# Patient Record
Sex: Male | Born: 1935
Health system: Southern US, Community
[De-identification: ages and names within clinical notes are randomized; demographics above are authoritative.]

## PROBLEM LIST (undated history)

## (undated) DIAGNOSIS — N183 Chronic kidney disease, stage 3 unspecified: Secondary | ICD-10-CM

## (undated) DIAGNOSIS — E785 Hyperlipidemia, unspecified: Secondary | ICD-10-CM

## (undated) DIAGNOSIS — M4800 Spinal stenosis, site unspecified: Secondary | ICD-10-CM

## (undated) DIAGNOSIS — E039 Hypothyroidism, unspecified: Secondary | ICD-10-CM

## (undated) DIAGNOSIS — I10 Essential (primary) hypertension: Secondary | ICD-10-CM

## (undated) DIAGNOSIS — I2510 Atherosclerotic heart disease of native coronary artery without angina pectoris: Secondary | ICD-10-CM

## (undated) DIAGNOSIS — E041 Nontoxic single thyroid nodule: Secondary | ICD-10-CM

## (undated) DIAGNOSIS — E669 Obesity, unspecified: Secondary | ICD-10-CM

## (undated) DIAGNOSIS — I639 Cerebral infarction, unspecified: Secondary | ICD-10-CM

## (undated) DIAGNOSIS — D649 Anemia, unspecified: Secondary | ICD-10-CM

## (undated) DIAGNOSIS — I499 Cardiac arrhythmia, unspecified: Secondary | ICD-10-CM

## (undated) HISTORY — PX: PILONIDAL CYST EXCISION: SHX744

## (undated) HISTORY — PX: TONSILLECTOMY: SUR1361

---

## 2011-11-07 DIAGNOSIS — M159 Polyosteoarthritis, unspecified: Secondary | ICD-10-CM | POA: Diagnosis not present

## 2011-12-05 DIAGNOSIS — L988 Other specified disorders of the skin and subcutaneous tissue: Secondary | ICD-10-CM | POA: Diagnosis not present

## 2012-02-06 DIAGNOSIS — L988 Other specified disorders of the skin and subcutaneous tissue: Secondary | ICD-10-CM | POA: Diagnosis not present

## 2012-02-06 DIAGNOSIS — B353 Tinea pedis: Secondary | ICD-10-CM | POA: Diagnosis not present

## 2012-02-10 DIAGNOSIS — M6530 Trigger finger, unspecified finger: Secondary | ICD-10-CM | POA: Diagnosis not present

## 2012-02-10 DIAGNOSIS — M5137 Other intervertebral disc degeneration, lumbosacral region: Secondary | ICD-10-CM | POA: Diagnosis not present

## 2012-02-10 DIAGNOSIS — M159 Polyosteoarthritis, unspecified: Secondary | ICD-10-CM | POA: Diagnosis not present

## 2012-02-17 DIAGNOSIS — R293 Abnormal posture: Secondary | ICD-10-CM | POA: Diagnosis not present

## 2012-02-17 DIAGNOSIS — M79609 Pain in unspecified limb: Secondary | ICD-10-CM | POA: Diagnosis not present

## 2012-02-17 DIAGNOSIS — M25549 Pain in joints of unspecified hand: Secondary | ICD-10-CM | POA: Diagnosis not present

## 2012-02-17 DIAGNOSIS — R269 Unspecified abnormalities of gait and mobility: Secondary | ICD-10-CM | POA: Diagnosis not present

## 2012-02-17 DIAGNOSIS — M19049 Primary osteoarthritis, unspecified hand: Secondary | ICD-10-CM | POA: Diagnosis not present

## 2012-02-19 DIAGNOSIS — M79609 Pain in unspecified limb: Secondary | ICD-10-CM | POA: Diagnosis not present

## 2012-02-19 DIAGNOSIS — M19049 Primary osteoarthritis, unspecified hand: Secondary | ICD-10-CM | POA: Diagnosis not present

## 2012-02-19 DIAGNOSIS — R269 Unspecified abnormalities of gait and mobility: Secondary | ICD-10-CM | POA: Diagnosis not present

## 2012-02-19 DIAGNOSIS — M25549 Pain in joints of unspecified hand: Secondary | ICD-10-CM | POA: Diagnosis not present

## 2012-02-19 DIAGNOSIS — R293 Abnormal posture: Secondary | ICD-10-CM | POA: Diagnosis not present

## 2012-02-24 DIAGNOSIS — M79609 Pain in unspecified limb: Secondary | ICD-10-CM | POA: Diagnosis not present

## 2012-02-24 DIAGNOSIS — R293 Abnormal posture: Secondary | ICD-10-CM | POA: Diagnosis not present

## 2012-02-24 DIAGNOSIS — M19049 Primary osteoarthritis, unspecified hand: Secondary | ICD-10-CM | POA: Diagnosis not present

## 2012-02-24 DIAGNOSIS — M25549 Pain in joints of unspecified hand: Secondary | ICD-10-CM | POA: Diagnosis not present

## 2012-02-24 DIAGNOSIS — R269 Unspecified abnormalities of gait and mobility: Secondary | ICD-10-CM | POA: Diagnosis not present

## 2012-02-26 DIAGNOSIS — M79609 Pain in unspecified limb: Secondary | ICD-10-CM | POA: Diagnosis not present

## 2012-02-26 DIAGNOSIS — M25549 Pain in joints of unspecified hand: Secondary | ICD-10-CM | POA: Diagnosis not present

## 2012-02-26 DIAGNOSIS — M19049 Primary osteoarthritis, unspecified hand: Secondary | ICD-10-CM | POA: Diagnosis not present

## 2012-02-26 DIAGNOSIS — R293 Abnormal posture: Secondary | ICD-10-CM | POA: Diagnosis not present

## 2012-02-26 DIAGNOSIS — R269 Unspecified abnormalities of gait and mobility: Secondary | ICD-10-CM | POA: Diagnosis not present

## 2012-02-27 DIAGNOSIS — M19049 Primary osteoarthritis, unspecified hand: Secondary | ICD-10-CM | POA: Diagnosis not present

## 2012-02-27 DIAGNOSIS — M25549 Pain in joints of unspecified hand: Secondary | ICD-10-CM | POA: Diagnosis not present

## 2012-02-27 DIAGNOSIS — R293 Abnormal posture: Secondary | ICD-10-CM | POA: Diagnosis not present

## 2012-02-27 DIAGNOSIS — R269 Unspecified abnormalities of gait and mobility: Secondary | ICD-10-CM | POA: Diagnosis not present

## 2012-02-27 DIAGNOSIS — M79609 Pain in unspecified limb: Secondary | ICD-10-CM | POA: Diagnosis not present

## 2012-03-01 DIAGNOSIS — M19049 Primary osteoarthritis, unspecified hand: Secondary | ICD-10-CM | POA: Diagnosis not present

## 2012-03-01 DIAGNOSIS — R269 Unspecified abnormalities of gait and mobility: Secondary | ICD-10-CM | POA: Diagnosis not present

## 2012-03-01 DIAGNOSIS — M25549 Pain in joints of unspecified hand: Secondary | ICD-10-CM | POA: Diagnosis not present

## 2012-03-01 DIAGNOSIS — R293 Abnormal posture: Secondary | ICD-10-CM | POA: Diagnosis not present

## 2012-03-01 DIAGNOSIS — M79609 Pain in unspecified limb: Secondary | ICD-10-CM | POA: Diagnosis not present

## 2012-03-03 DIAGNOSIS — M19049 Primary osteoarthritis, unspecified hand: Secondary | ICD-10-CM | POA: Diagnosis not present

## 2012-03-03 DIAGNOSIS — R293 Abnormal posture: Secondary | ICD-10-CM | POA: Diagnosis not present

## 2012-03-03 DIAGNOSIS — M25549 Pain in joints of unspecified hand: Secondary | ICD-10-CM | POA: Diagnosis not present

## 2012-03-03 DIAGNOSIS — R269 Unspecified abnormalities of gait and mobility: Secondary | ICD-10-CM | POA: Diagnosis not present

## 2012-03-03 DIAGNOSIS — M79609 Pain in unspecified limb: Secondary | ICD-10-CM | POA: Diagnosis not present

## 2012-03-10 DIAGNOSIS — R269 Unspecified abnormalities of gait and mobility: Secondary | ICD-10-CM | POA: Diagnosis not present

## 2012-03-10 DIAGNOSIS — M25549 Pain in joints of unspecified hand: Secondary | ICD-10-CM | POA: Diagnosis not present

## 2012-03-10 DIAGNOSIS — R293 Abnormal posture: Secondary | ICD-10-CM | POA: Diagnosis not present

## 2012-03-10 DIAGNOSIS — M79609 Pain in unspecified limb: Secondary | ICD-10-CM | POA: Diagnosis not present

## 2012-03-10 DIAGNOSIS — M19049 Primary osteoarthritis, unspecified hand: Secondary | ICD-10-CM | POA: Diagnosis not present

## 2012-03-11 DIAGNOSIS — M159 Polyosteoarthritis, unspecified: Secondary | ICD-10-CM | POA: Diagnosis not present

## 2012-03-12 DIAGNOSIS — M19049 Primary osteoarthritis, unspecified hand: Secondary | ICD-10-CM | POA: Diagnosis not present

## 2012-03-12 DIAGNOSIS — R269 Unspecified abnormalities of gait and mobility: Secondary | ICD-10-CM | POA: Diagnosis not present

## 2012-03-12 DIAGNOSIS — M25549 Pain in joints of unspecified hand: Secondary | ICD-10-CM | POA: Diagnosis not present

## 2012-03-12 DIAGNOSIS — R293 Abnormal posture: Secondary | ICD-10-CM | POA: Diagnosis not present

## 2012-03-12 DIAGNOSIS — M79609 Pain in unspecified limb: Secondary | ICD-10-CM | POA: Diagnosis not present

## 2012-03-16 DIAGNOSIS — M79609 Pain in unspecified limb: Secondary | ICD-10-CM | POA: Diagnosis not present

## 2012-03-16 DIAGNOSIS — M19049 Primary osteoarthritis, unspecified hand: Secondary | ICD-10-CM | POA: Diagnosis not present

## 2012-03-16 DIAGNOSIS — M25549 Pain in joints of unspecified hand: Secondary | ICD-10-CM | POA: Diagnosis not present

## 2012-03-16 DIAGNOSIS — R269 Unspecified abnormalities of gait and mobility: Secondary | ICD-10-CM | POA: Diagnosis not present

## 2012-03-16 DIAGNOSIS — R293 Abnormal posture: Secondary | ICD-10-CM | POA: Diagnosis not present

## 2012-03-18 DIAGNOSIS — M19049 Primary osteoarthritis, unspecified hand: Secondary | ICD-10-CM | POA: Diagnosis not present

## 2012-03-18 DIAGNOSIS — R293 Abnormal posture: Secondary | ICD-10-CM | POA: Diagnosis not present

## 2012-03-18 DIAGNOSIS — M25549 Pain in joints of unspecified hand: Secondary | ICD-10-CM | POA: Diagnosis not present

## 2012-03-18 DIAGNOSIS — R269 Unspecified abnormalities of gait and mobility: Secondary | ICD-10-CM | POA: Diagnosis not present

## 2012-03-18 DIAGNOSIS — M79609 Pain in unspecified limb: Secondary | ICD-10-CM | POA: Diagnosis not present

## 2012-04-09 DIAGNOSIS — L988 Other specified disorders of the skin and subcutaneous tissue: Secondary | ICD-10-CM | POA: Diagnosis not present

## 2012-06-11 DIAGNOSIS — L988 Other specified disorders of the skin and subcutaneous tissue: Secondary | ICD-10-CM | POA: Diagnosis not present

## 2012-06-11 DIAGNOSIS — B351 Tinea unguium: Secondary | ICD-10-CM | POA: Diagnosis not present

## 2012-06-16 DIAGNOSIS — M159 Polyosteoarthritis, unspecified: Secondary | ICD-10-CM | POA: Diagnosis not present

## 2012-06-16 DIAGNOSIS — Z79899 Other long term (current) drug therapy: Secondary | ICD-10-CM | POA: Diagnosis not present

## 2012-08-11 DIAGNOSIS — L988 Other specified disorders of the skin and subcutaneous tissue: Secondary | ICD-10-CM | POA: Diagnosis not present

## 2012-09-06 DIAGNOSIS — M159 Polyosteoarthritis, unspecified: Secondary | ICD-10-CM | POA: Diagnosis not present

## 2012-09-06 DIAGNOSIS — M5137 Other intervertebral disc degeneration, lumbosacral region: Secondary | ICD-10-CM | POA: Diagnosis not present

## 2012-10-15 DIAGNOSIS — L988 Other specified disorders of the skin and subcutaneous tissue: Secondary | ICD-10-CM | POA: Diagnosis not present

## 2012-11-22 DIAGNOSIS — D649 Anemia, unspecified: Secondary | ICD-10-CM | POA: Diagnosis not present

## 2012-11-22 DIAGNOSIS — M159 Polyosteoarthritis, unspecified: Secondary | ICD-10-CM | POA: Diagnosis not present

## 2012-12-09 DIAGNOSIS — M25569 Pain in unspecified knee: Secondary | ICD-10-CM | POA: Diagnosis not present

## 2012-12-09 DIAGNOSIS — M159 Polyosteoarthritis, unspecified: Secondary | ICD-10-CM | POA: Diagnosis not present

## 2012-12-21 DIAGNOSIS — L988 Other specified disorders of the skin and subcutaneous tissue: Secondary | ICD-10-CM | POA: Diagnosis not present

## 2013-03-02 DIAGNOSIS — L988 Other specified disorders of the skin and subcutaneous tissue: Secondary | ICD-10-CM | POA: Diagnosis not present

## 2013-03-02 DIAGNOSIS — M159 Polyosteoarthritis, unspecified: Secondary | ICD-10-CM | POA: Diagnosis not present

## 2013-05-09 DIAGNOSIS — L988 Other specified disorders of the skin and subcutaneous tissue: Secondary | ICD-10-CM | POA: Diagnosis not present

## 2013-09-01 DIAGNOSIS — Z23 Encounter for immunization: Secondary | ICD-10-CM | POA: Diagnosis not present

## 2014-04-26 DIAGNOSIS — Z79899 Other long term (current) drug therapy: Secondary | ICD-10-CM | POA: Diagnosis not present

## 2014-04-26 DIAGNOSIS — Z0000 Encounter for general adult medical examination without abnormal findings: Secondary | ICD-10-CM | POA: Diagnosis not present

## 2014-04-26 DIAGNOSIS — E785 Hyperlipidemia, unspecified: Secondary | ICD-10-CM | POA: Diagnosis not present

## 2014-04-26 DIAGNOSIS — Z125 Encounter for screening for malignant neoplasm of prostate: Secondary | ICD-10-CM | POA: Diagnosis not present

## 2014-04-26 DIAGNOSIS — Z7189 Other specified counseling: Secondary | ICD-10-CM | POA: Diagnosis not present

## 2014-05-03 ENCOUNTER — Other Ambulatory Visit: Payer: Self-pay | Admitting: Family

## 2014-05-03 ENCOUNTER — Ambulatory Visit
Admission: RE | Admit: 2014-05-03 | Discharge: 2014-05-03 | Disposition: A | Discharge status: Home / Self Care | Payer: Medicare Other | Source: Ambulatory Visit | Attending: Family | Admitting: Family

## 2014-05-03 DIAGNOSIS — M25559 Pain in unspecified hip: Secondary | ICD-10-CM | POA: Diagnosis not present

## 2014-05-03 DIAGNOSIS — S79919A Unspecified injury of unspecified hip, initial encounter: Secondary | ICD-10-CM | POA: Diagnosis not present

## 2014-05-03 DIAGNOSIS — W19XXXA Unspecified fall, initial encounter: Secondary | ICD-10-CM | POA: Diagnosis not present

## 2014-05-03 DIAGNOSIS — S79929A Unspecified injury of unspecified thigh, initial encounter: Secondary | ICD-10-CM | POA: Diagnosis not present

## 2014-05-03 DIAGNOSIS — I10 Essential (primary) hypertension: Secondary | ICD-10-CM | POA: Diagnosis not present

## 2014-05-03 DIAGNOSIS — E785 Hyperlipidemia, unspecified: Secondary | ICD-10-CM | POA: Diagnosis not present

## 2014-05-03 DIAGNOSIS — E039 Hypothyroidism, unspecified: Secondary | ICD-10-CM | POA: Diagnosis not present

## 2014-05-03 DIAGNOSIS — K219 Gastro-esophageal reflux disease without esophagitis: Secondary | ICD-10-CM | POA: Diagnosis not present

## 2014-05-03 DIAGNOSIS — N189 Chronic kidney disease, unspecified: Secondary | ICD-10-CM | POA: Diagnosis not present

## 2014-05-18 DIAGNOSIS — R972 Elevated prostate specific antigen [PSA]: Secondary | ICD-10-CM | POA: Diagnosis not present

## 2014-06-01 DIAGNOSIS — M21619 Bunion of unspecified foot: Secondary | ICD-10-CM | POA: Diagnosis not present

## 2014-06-01 DIAGNOSIS — L84 Corns and callosities: Secondary | ICD-10-CM | POA: Diagnosis not present

## 2014-06-01 DIAGNOSIS — L608 Other nail disorders: Secondary | ICD-10-CM | POA: Diagnosis not present

## 2014-06-01 DIAGNOSIS — M2010 Hallux valgus (acquired), unspecified foot: Secondary | ICD-10-CM | POA: Diagnosis not present

## 2014-06-01 DIAGNOSIS — B351 Tinea unguium: Secondary | ICD-10-CM | POA: Diagnosis not present

## 2014-06-26 DIAGNOSIS — L608 Other nail disorders: Secondary | ICD-10-CM | POA: Diagnosis not present

## 2014-06-28 DIAGNOSIS — G4761 Periodic limb movement disorder: Secondary | ICD-10-CM | POA: Diagnosis not present

## 2014-07-27 DIAGNOSIS — Z23 Encounter for immunization: Secondary | ICD-10-CM | POA: Diagnosis not present

## 2014-08-28 DIAGNOSIS — Z87898 Personal history of other specified conditions: Secondary | ICD-10-CM | POA: Diagnosis not present

## 2014-08-28 DIAGNOSIS — M25551 Pain in right hip: Secondary | ICD-10-CM | POA: Diagnosis not present

## 2014-08-31 DIAGNOSIS — M1611 Unilateral primary osteoarthritis, right hip: Secondary | ICD-10-CM | POA: Diagnosis not present

## 2014-08-31 DIAGNOSIS — M25551 Pain in right hip: Secondary | ICD-10-CM | POA: Diagnosis not present

## 2014-09-13 DIAGNOSIS — B351 Tinea unguium: Secondary | ICD-10-CM | POA: Diagnosis not present

## 2014-09-20 DIAGNOSIS — M25551 Pain in right hip: Secondary | ICD-10-CM | POA: Diagnosis not present

## 2014-09-20 DIAGNOSIS — M25571 Pain in right ankle and joints of right foot: Secondary | ICD-10-CM | POA: Diagnosis not present

## 2014-09-20 DIAGNOSIS — D649 Anemia, unspecified: Secondary | ICD-10-CM | POA: Diagnosis not present

## 2014-09-21 DIAGNOSIS — M7661 Achilles tendinitis, right leg: Secondary | ICD-10-CM | POA: Diagnosis not present

## 2014-09-21 DIAGNOSIS — M4317 Spondylolisthesis, lumbosacral region: Secondary | ICD-10-CM | POA: Diagnosis not present

## 2014-09-21 DIAGNOSIS — M5441 Lumbago with sciatica, right side: Secondary | ICD-10-CM | POA: Diagnosis not present

## 2014-10-05 DIAGNOSIS — M25551 Pain in right hip: Secondary | ICD-10-CM | POA: Diagnosis not present

## 2014-10-05 DIAGNOSIS — M5441 Lumbago with sciatica, right side: Secondary | ICD-10-CM | POA: Diagnosis not present

## 2014-10-05 DIAGNOSIS — M4317 Spondylolisthesis, lumbosacral region: Secondary | ICD-10-CM | POA: Diagnosis not present

## 2014-10-16 DIAGNOSIS — D649 Anemia, unspecified: Secondary | ICD-10-CM | POA: Diagnosis not present

## 2014-10-16 DIAGNOSIS — J019 Acute sinusitis, unspecified: Secondary | ICD-10-CM | POA: Diagnosis not present

## 2014-10-24 DIAGNOSIS — M5441 Lumbago with sciatica, right side: Secondary | ICD-10-CM | POA: Diagnosis not present

## 2014-10-31 DIAGNOSIS — M6281 Muscle weakness (generalized): Secondary | ICD-10-CM | POA: Diagnosis not present

## 2014-10-31 DIAGNOSIS — M2560 Stiffness of unspecified joint, not elsewhere classified: Secondary | ICD-10-CM | POA: Diagnosis not present

## 2014-10-31 DIAGNOSIS — M5441 Lumbago with sciatica, right side: Secondary | ICD-10-CM | POA: Diagnosis not present

## 2014-11-02 DIAGNOSIS — M2560 Stiffness of unspecified joint, not elsewhere classified: Secondary | ICD-10-CM | POA: Diagnosis not present

## 2014-11-02 DIAGNOSIS — M5441 Lumbago with sciatica, right side: Secondary | ICD-10-CM | POA: Diagnosis not present

## 2014-11-02 DIAGNOSIS — M6281 Muscle weakness (generalized): Secondary | ICD-10-CM | POA: Diagnosis not present

## 2014-11-06 DIAGNOSIS — M2560 Stiffness of unspecified joint, not elsewhere classified: Secondary | ICD-10-CM | POA: Diagnosis not present

## 2014-11-06 DIAGNOSIS — M5441 Lumbago with sciatica, right side: Secondary | ICD-10-CM | POA: Diagnosis not present

## 2014-11-06 DIAGNOSIS — M6281 Muscle weakness (generalized): Secondary | ICD-10-CM | POA: Diagnosis not present

## 2014-11-08 DIAGNOSIS — M2560 Stiffness of unspecified joint, not elsewhere classified: Secondary | ICD-10-CM | POA: Diagnosis not present

## 2014-11-08 DIAGNOSIS — M6281 Muscle weakness (generalized): Secondary | ICD-10-CM | POA: Diagnosis not present

## 2014-11-08 DIAGNOSIS — M5441 Lumbago with sciatica, right side: Secondary | ICD-10-CM | POA: Diagnosis not present

## 2014-11-13 DIAGNOSIS — M5441 Lumbago with sciatica, right side: Secondary | ICD-10-CM | POA: Diagnosis not present

## 2014-11-13 DIAGNOSIS — M2560 Stiffness of unspecified joint, not elsewhere classified: Secondary | ICD-10-CM | POA: Diagnosis not present

## 2014-11-13 DIAGNOSIS — M6281 Muscle weakness (generalized): Secondary | ICD-10-CM | POA: Diagnosis not present

## 2014-11-15 DIAGNOSIS — M2560 Stiffness of unspecified joint, not elsewhere classified: Secondary | ICD-10-CM | POA: Diagnosis not present

## 2014-11-15 DIAGNOSIS — M5441 Lumbago with sciatica, right side: Secondary | ICD-10-CM | POA: Diagnosis not present

## 2014-11-15 DIAGNOSIS — M6281 Muscle weakness (generalized): Secondary | ICD-10-CM | POA: Diagnosis not present

## 2014-11-20 DIAGNOSIS — M2560 Stiffness of unspecified joint, not elsewhere classified: Secondary | ICD-10-CM | POA: Diagnosis not present

## 2014-11-20 DIAGNOSIS — M5441 Lumbago with sciatica, right side: Secondary | ICD-10-CM | POA: Diagnosis not present

## 2014-11-20 DIAGNOSIS — M6281 Muscle weakness (generalized): Secondary | ICD-10-CM | POA: Diagnosis not present

## 2014-11-22 DIAGNOSIS — M6281 Muscle weakness (generalized): Secondary | ICD-10-CM | POA: Diagnosis not present

## 2014-11-22 DIAGNOSIS — M2560 Stiffness of unspecified joint, not elsewhere classified: Secondary | ICD-10-CM | POA: Diagnosis not present

## 2014-11-22 DIAGNOSIS — M5441 Lumbago with sciatica, right side: Secondary | ICD-10-CM | POA: Diagnosis not present

## 2014-11-27 DIAGNOSIS — M6281 Muscle weakness (generalized): Secondary | ICD-10-CM | POA: Diagnosis not present

## 2014-11-27 DIAGNOSIS — M2560 Stiffness of unspecified joint, not elsewhere classified: Secondary | ICD-10-CM | POA: Diagnosis not present

## 2014-11-27 DIAGNOSIS — M5441 Lumbago with sciatica, right side: Secondary | ICD-10-CM | POA: Diagnosis not present

## 2014-11-28 DIAGNOSIS — M7061 Trochanteric bursitis, right hip: Secondary | ICD-10-CM | POA: Diagnosis not present

## 2014-11-28 DIAGNOSIS — M5441 Lumbago with sciatica, right side: Secondary | ICD-10-CM | POA: Diagnosis not present

## 2014-11-29 DIAGNOSIS — M6281 Muscle weakness (generalized): Secondary | ICD-10-CM | POA: Diagnosis not present

## 2014-11-29 DIAGNOSIS — M5441 Lumbago with sciatica, right side: Secondary | ICD-10-CM | POA: Diagnosis not present

## 2014-11-29 DIAGNOSIS — M2560 Stiffness of unspecified joint, not elsewhere classified: Secondary | ICD-10-CM | POA: Diagnosis not present

## 2014-12-04 DIAGNOSIS — M5441 Lumbago with sciatica, right side: Secondary | ICD-10-CM | POA: Diagnosis not present

## 2014-12-04 DIAGNOSIS — M6281 Muscle weakness (generalized): Secondary | ICD-10-CM | POA: Diagnosis not present

## 2014-12-04 DIAGNOSIS — M2560 Stiffness of unspecified joint, not elsewhere classified: Secondary | ICD-10-CM | POA: Diagnosis not present

## 2014-12-06 DIAGNOSIS — M5441 Lumbago with sciatica, right side: Secondary | ICD-10-CM | POA: Diagnosis not present

## 2014-12-06 DIAGNOSIS — M6281 Muscle weakness (generalized): Secondary | ICD-10-CM | POA: Diagnosis not present

## 2014-12-06 DIAGNOSIS — M2560 Stiffness of unspecified joint, not elsewhere classified: Secondary | ICD-10-CM | POA: Diagnosis not present

## 2014-12-11 DIAGNOSIS — M2560 Stiffness of unspecified joint, not elsewhere classified: Secondary | ICD-10-CM | POA: Diagnosis not present

## 2014-12-11 DIAGNOSIS — M5441 Lumbago with sciatica, right side: Secondary | ICD-10-CM | POA: Diagnosis not present

## 2014-12-11 DIAGNOSIS — M6281 Muscle weakness (generalized): Secondary | ICD-10-CM | POA: Diagnosis not present

## 2014-12-13 DIAGNOSIS — M2560 Stiffness of unspecified joint, not elsewhere classified: Secondary | ICD-10-CM | POA: Diagnosis not present

## 2014-12-13 DIAGNOSIS — M6281 Muscle weakness (generalized): Secondary | ICD-10-CM | POA: Diagnosis not present

## 2014-12-13 DIAGNOSIS — M5441 Lumbago with sciatica, right side: Secondary | ICD-10-CM | POA: Diagnosis not present

## 2014-12-18 DIAGNOSIS — M5441 Lumbago with sciatica, right side: Secondary | ICD-10-CM | POA: Diagnosis not present

## 2014-12-18 DIAGNOSIS — M6281 Muscle weakness (generalized): Secondary | ICD-10-CM | POA: Diagnosis not present

## 2014-12-18 DIAGNOSIS — M2560 Stiffness of unspecified joint, not elsewhere classified: Secondary | ICD-10-CM | POA: Diagnosis not present

## 2014-12-20 DIAGNOSIS — M6281 Muscle weakness (generalized): Secondary | ICD-10-CM | POA: Diagnosis not present

## 2014-12-20 DIAGNOSIS — M5441 Lumbago with sciatica, right side: Secondary | ICD-10-CM | POA: Diagnosis not present

## 2014-12-20 DIAGNOSIS — M2560 Stiffness of unspecified joint, not elsewhere classified: Secondary | ICD-10-CM | POA: Diagnosis not present

## 2015-01-03 DIAGNOSIS — M5441 Lumbago with sciatica, right side: Secondary | ICD-10-CM | POA: Diagnosis not present

## 2015-01-03 DIAGNOSIS — M6281 Muscle weakness (generalized): Secondary | ICD-10-CM | POA: Diagnosis not present

## 2015-01-03 DIAGNOSIS — M2560 Stiffness of unspecified joint, not elsewhere classified: Secondary | ICD-10-CM | POA: Diagnosis not present

## 2015-01-09 DIAGNOSIS — M2560 Stiffness of unspecified joint, not elsewhere classified: Secondary | ICD-10-CM | POA: Diagnosis not present

## 2015-01-09 DIAGNOSIS — M6281 Muscle weakness (generalized): Secondary | ICD-10-CM | POA: Diagnosis not present

## 2015-01-09 DIAGNOSIS — M7061 Trochanteric bursitis, right hip: Secondary | ICD-10-CM | POA: Diagnosis not present

## 2015-01-09 DIAGNOSIS — M5441 Lumbago with sciatica, right side: Secondary | ICD-10-CM | POA: Diagnosis not present

## 2015-02-12 DIAGNOSIS — R972 Elevated prostate specific antigen [PSA]: Secondary | ICD-10-CM | POA: Diagnosis not present

## 2015-03-25 ENCOUNTER — Emergency Department (HOSPITAL_COMMUNITY): Payer: Medicare Other

## 2015-03-25 ENCOUNTER — Encounter (HOSPITAL_COMMUNITY): Payer: Self-pay | Admitting: Emergency Medicine

## 2015-03-25 ENCOUNTER — Inpatient Hospital Stay (HOSPITAL_COMMUNITY)
Admission: EM | Admit: 2015-03-25 | Discharge: 2015-03-28 | DRG: 281 | Disposition: A | Discharge status: Home / Self Care | Payer: Medicare Other | Attending: Internal Medicine | Admitting: Internal Medicine

## 2015-03-25 DIAGNOSIS — E669 Obesity, unspecified: Secondary | ICD-10-CM | POA: Diagnosis present

## 2015-03-25 DIAGNOSIS — N529 Male erectile dysfunction, unspecified: Secondary | ICD-10-CM | POA: Diagnosis present

## 2015-03-25 DIAGNOSIS — Z79899 Other long term (current) drug therapy: Secondary | ICD-10-CM

## 2015-03-25 DIAGNOSIS — R7989 Other specified abnormal findings of blood chemistry: Secondary | ICD-10-CM | POA: Diagnosis not present

## 2015-03-25 DIAGNOSIS — I2510 Atherosclerotic heart disease of native coronary artery without angina pectoris: Secondary | ICD-10-CM | POA: Diagnosis not present

## 2015-03-25 DIAGNOSIS — I959 Hypotension, unspecified: Secondary | ICD-10-CM | POA: Diagnosis present

## 2015-03-25 DIAGNOSIS — Z87891 Personal history of nicotine dependence: Secondary | ICD-10-CM | POA: Diagnosis not present

## 2015-03-25 DIAGNOSIS — Z6834 Body mass index (BMI) 34.0-34.9, adult: Secondary | ICD-10-CM

## 2015-03-25 DIAGNOSIS — I2582 Chronic total occlusion of coronary artery: Secondary | ICD-10-CM | POA: Diagnosis present

## 2015-03-25 DIAGNOSIS — I25110 Atherosclerotic heart disease of native coronary artery with unstable angina pectoris: Secondary | ICD-10-CM | POA: Diagnosis present

## 2015-03-25 DIAGNOSIS — I10 Essential (primary) hypertension: Secondary | ICD-10-CM | POA: Diagnosis present

## 2015-03-25 DIAGNOSIS — R079 Chest pain, unspecified: Secondary | ICD-10-CM | POA: Diagnosis not present

## 2015-03-25 DIAGNOSIS — I951 Orthostatic hypotension: Secondary | ICD-10-CM | POA: Diagnosis not present

## 2015-03-25 DIAGNOSIS — I214 Non-ST elevation (NSTEMI) myocardial infarction: Secondary | ICD-10-CM | POA: Diagnosis not present

## 2015-03-25 DIAGNOSIS — N183 Chronic kidney disease, stage 3 unspecified: Secondary | ICD-10-CM | POA: Diagnosis present

## 2015-03-25 DIAGNOSIS — M4800 Spinal stenosis, site unspecified: Secondary | ICD-10-CM | POA: Diagnosis present

## 2015-03-25 DIAGNOSIS — I472 Ventricular tachycardia: Secondary | ICD-10-CM | POA: Diagnosis not present

## 2015-03-25 DIAGNOSIS — R001 Bradycardia, unspecified: Secondary | ICD-10-CM | POA: Diagnosis not present

## 2015-03-25 DIAGNOSIS — I459 Conduction disorder, unspecified: Secondary | ICD-10-CM | POA: Diagnosis present

## 2015-03-25 DIAGNOSIS — I4729 Other ventricular tachycardia: Secondary | ICD-10-CM

## 2015-03-25 DIAGNOSIS — E780 Pure hypercholesterolemia: Secondary | ICD-10-CM | POA: Diagnosis present

## 2015-03-25 DIAGNOSIS — E039 Hypothyroidism, unspecified: Secondary | ICD-10-CM | POA: Diagnosis present

## 2015-03-25 DIAGNOSIS — Z8249 Family history of ischemic heart disease and other diseases of the circulatory system: Secondary | ICD-10-CM | POA: Diagnosis not present

## 2015-03-25 DIAGNOSIS — Z7982 Long term (current) use of aspirin: Secondary | ICD-10-CM | POA: Diagnosis not present

## 2015-03-25 DIAGNOSIS — E785 Hyperlipidemia, unspecified: Secondary | ICD-10-CM | POA: Diagnosis present

## 2015-03-25 DIAGNOSIS — I129 Hypertensive chronic kidney disease with stage 1 through stage 4 chronic kidney disease, or unspecified chronic kidney disease: Secondary | ICD-10-CM | POA: Diagnosis present

## 2015-03-25 DIAGNOSIS — I213 ST elevation (STEMI) myocardial infarction of unspecified site: Secondary | ICD-10-CM | POA: Diagnosis not present

## 2015-03-25 DIAGNOSIS — M4807 Spinal stenosis, lumbosacral region: Secondary | ICD-10-CM

## 2015-03-25 HISTORY — DX: Chronic kidney disease, stage 3 (moderate): N18.3

## 2015-03-25 HISTORY — DX: Hyperlipidemia, unspecified: E78.5

## 2015-03-25 HISTORY — DX: Chronic kidney disease, stage 3 unspecified: N18.30

## 2015-03-25 HISTORY — DX: Nontoxic single thyroid nodule: E04.1

## 2015-03-25 HISTORY — DX: Anemia, unspecified: D64.9

## 2015-03-25 HISTORY — DX: Hypothyroidism, unspecified: E03.9

## 2015-03-25 HISTORY — DX: Spinal stenosis, site unspecified: M48.00

## 2015-03-25 HISTORY — DX: Atherosclerotic heart disease of native coronary artery without angina pectoris: I25.10

## 2015-03-25 HISTORY — DX: Obesity, unspecified: E66.9

## 2015-03-25 HISTORY — DX: Essential (primary) hypertension: I10

## 2015-03-25 LAB — COMPREHENSIVE METABOLIC PANEL
ALT: 35 U/L (ref 17–63)
AST: 40 U/L (ref 15–41)
Albumin: 4.1 g/dL (ref 3.5–5.0)
Alkaline Phosphatase: 55 U/L (ref 38–126)
Anion gap: 8 (ref 5–15)
BILIRUBIN TOTAL: 0.8 mg/dL (ref 0.3–1.2)
BUN: 24 mg/dL — AB (ref 6–20)
CHLORIDE: 99 mmol/L — AB (ref 101–111)
CO2: 28 mmol/L (ref 22–32)
Calcium: 9.3 mg/dL (ref 8.9–10.3)
Creatinine, Ser: 1.47 mg/dL — ABNORMAL HIGH (ref 0.61–1.24)
GFR calc non Af Amer: 44 mL/min — ABNORMAL LOW (ref >60)
GFR, EST AFRICAN AMERICAN: 51 mL/min — AB (ref >60)
GLUCOSE: 90 mg/dL (ref 65–99)
POTASSIUM: 4 mmol/L (ref 3.5–5.1)
Sodium: 135 mmol/L (ref 135–145)
Total Protein: 6.9 g/dL (ref 6.5–8.1)

## 2015-03-25 LAB — CBC
HCT: 44.9 % (ref 39.0–52.0)
Hemoglobin: 15.4 g/dL (ref 13.0–17.0)
MCH: 28.8 pg (ref 26.0–34.0)
MCHC: 34.3 g/dL (ref 30.0–36.0)
MCV: 83.9 fL (ref 78.0–100.0)
Platelets: 237 10*3/uL (ref 150–400)
RBC: 5.35 MIL/uL (ref 4.22–5.81)
RDW: 12.3 % (ref 11.5–15.5)
WBC: 8.6 10*3/uL (ref 4.0–10.5)

## 2015-03-25 LAB — PROTIME-INR
INR: 1.05 (ref 0.00–1.49)
PROTHROMBIN TIME: 13.9 s (ref 11.6–15.2)

## 2015-03-25 LAB — I-STAT TROPONIN, ED: Troponin i, poc: 0.52 ng/mL (ref 0.00–0.08)

## 2015-03-25 LAB — TROPONIN I
Troponin I: 0.48 ng/mL — ABNORMAL HIGH (ref <0.031)
Troponin I: 3.25 ng/mL (ref <0.031)

## 2015-03-25 LAB — MAGNESIUM: Magnesium: 2.1 mg/dL (ref 1.7–2.4)

## 2015-03-25 MED ORDER — HEPARIN BOLUS VIA INFUSION
4000.0000 [IU] | Freq: Once | INTRAVENOUS | Status: AC
  Administered 2015-03-25: 4000 [IU] via INTRAVENOUS
  Filled 2015-03-25: qty 4000

## 2015-03-25 MED ORDER — ATORVASTATIN CALCIUM 40 MG PO TABS
40.0000 mg | ORAL_TABLET | Freq: Every day | ORAL | Status: DC
  Administered 2015-03-26: 40 mg via ORAL
  Filled 2015-03-25 (×2): qty 1

## 2015-03-25 MED ORDER — HEPARIN (PORCINE) IN NACL 100-0.45 UNIT/ML-% IJ SOLN
1150.0000 [IU]/h | INTRAMUSCULAR | Status: DC
  Administered 2015-03-25 – 2015-03-26 (×2): 1150 [IU]/h via INTRAVENOUS
  Filled 2015-03-25 (×3): qty 250

## 2015-03-25 MED ORDER — NITROGLYCERIN IN D5W 200-5 MCG/ML-% IV SOLN
3.0000 ug/min | INTRAVENOUS | Status: DC
  Administered 2015-03-25: 3 ug/min via INTRAVENOUS
  Filled 2015-03-25: qty 250

## 2015-03-25 MED ORDER — SODIUM CHLORIDE 0.9 % IV SOLN
1000.0000 mL | INTRAVENOUS | Status: DC
  Administered 2015-03-25 – 2015-03-26 (×2): 1000 mL via INTRAVENOUS

## 2015-03-25 MED ORDER — MORPHINE SULFATE 4 MG/ML IJ SOLN
4.0000 mg | Freq: Once | INTRAMUSCULAR | Status: AC
  Administered 2015-03-25: 4 mg via INTRAVENOUS
  Filled 2015-03-25: qty 1

## 2015-03-25 MED ORDER — HEPARIN (PORCINE) IN NACL 100-0.45 UNIT/ML-% IJ SOLN
12.0000 [IU]/kg/h | INTRAMUSCULAR | Status: DC
  Filled 2015-03-25: qty 250

## 2015-03-25 NOTE — ED Notes (Signed)
NOTIFIED DR. LOCKWOOD IN PERSON FOR PATIENTS LAB RESULTS OF I-STAT TROPONIN @17 :28 PM ,03/25/2015 .

## 2015-03-25 NOTE — ED Provider Notes (Signed)
CSN: [id number]     Arrival date & time 03/25/15  1623 History   First MD Initiated Contact with Patient 03/25/15 1622     Chief Complaint  Patient presents with  . Chest Pain     (Consider location/radiation/quality/duration/timing/severity/associated sxs/prior Treatment) HPI Patient presents with concern of chest pain. Patient was finishing lunch, when he had sudden onset of midsternal chest pressure. This was about 2 hours ago. Patient was also diaphoretic, cool, clammy. There was mild nausea, but no vomiting. Patient used Pepto-Bismol, Tums, aspirin, with substantial reduction of his discomfort. Patient also states that he had burping, which seemed to alleviate his symptoms as well. There was no associated dyspnea, nor syncope, nor asymmetric weakness anywhere. Patient acknowledges a history of hypertension, denies history of coronary disease. Currently the patient has negligable anterior chest discomfort.  Past Medical History  Diagnosis Date  . Hypertension   . Hypercholesteremia   . Thyroid disease   . Arthritis   . Anemia    Past Surgical History  Procedure Laterality Date  . Tonsillectomy    . Pilonidal cyst excision     History reviewed. No pertinent family history. History  Substance Use Topics  . Smoking status: Never Smoker   . Smokeless tobacco: Not on file  . Alcohol Use: Yes     Comment: rarely    Review of Systems  Constitutional:       Per HPI, otherwise negative  HENT:       Per HPI, otherwise negative  Eyes:       Patient acknowledges occasional episodes of floaters, bilaterally, including this morning, but none during this episode of chest pain or currently.  Respiratory:       Per HPI, otherwise negative  Cardiovascular:       Per HPI, otherwise negative  Gastrointestinal: Negative for vomiting.  Endocrine:       Negative aside from HPI  Genitourinary:       Neg aside from HPI   Musculoskeletal:       Per HPI, otherwise negative   Skin: Negative.   Neurological: Negative for syncope and weakness.      Allergies  Review of patient's allergies indicates no known allergies.  Home Medications   Prior to Admission medications   Not on File   BP 157/82 mmHg  Pulse 67  Temp(Src) 97.5 F (36.4 C) (Oral)  Resp 13  SpO2 100% Physical Exam  Constitutional: He is oriented to person, place, and time. He appears well-developed. No distress.  HENT:  Head: Normocephalic and atraumatic.  Eyes: Conjunctivae and EOM are normal.  Cardiovascular: Normal rate and regular rhythm.   Pulmonary/Chest: Effort normal. No stridor. No respiratory distress.  Abdominal: He exhibits no distension.  Musculoskeletal: He exhibits no edema.  Neurological: He is alert and oriented to person, place, and time. No cranial nerve deficit. He exhibits normal muscle tone. Coordination normal.  Skin: Skin is warm and dry.  Psychiatric: He has a normal mood and affect.  Nursing note and vitals reviewed.   ED Course  Procedures (including critical care time) Labs Review Labs Reviewed  CBC  COMPREHENSIVE METABOLIC PANEL  MAGNESIUM  PROTIME-INR  TROPONIN I  I-STAT TROPOININ, ED    Imaging Review No results found.  ECG #1 - artefact, [id number], not interpretable    EKG Interpretation   Date/Time:  Sunday Mar 25 2015 16:35:42 EDT Ventricular Rate:  62 PR Interval:  211 QRS Duration: 113 QT Interval:  412 QTC Calculation: 418  R Axis:   -43 Text Interpretation:  Sinus rhythm Abnormal R-wave progression, early  transition LVH with IVCD, LAD and secondary repol abnrm Anterior ST  elevation, probably due to LVH Sinus rhythm Left ventricular hypertrophy  hypertrophic changes Non-specific intra-ventricular conduction delay  Abnormal ekg Confirmed by Gerhard Munch  MD ([id number]) on 03/25/2015 4:42:41  PM     Cardiac 65 sinus normal Pulse ox 100% room air normal  Initial troponin abnormal, 0.52. I have discussed the patient's case  with cardiology. Patient will begin receiving heparin, for possible coronary event.  Update: Patient w recurrent pain - morphine provided.  Update: after improved pain, the pain is back.  Nitro provided (no Viagra >1d)  Update: Patient seen by cards.  He will be admitted for futher e/m, including cath.  MDM  Patient presents after episode of acute chest pain. By the time of arrival, the patient's pain is negligible, though he does have several recurrences in the emergency department. Patient has a history of hypertension, hypercholesterolemia, but no history of coronary disease. Given the patient's description of diaphoresis, weakness, pain, there was initial suspicion of cardiac event, and the patient's initial troponin was abnormal. Patient was started on heparin drip, nitroglycerin drip for pain control, admitted for further evaluation, management.  CRITICAL CARE Performed by: Gerhard Munch Total critical care time: 40 Critical care time was exclusive of separately billable procedures and treating other patients. Critical care was necessary to treat or prevent imminent or life-threatening deterioration. Critical care was time spent personally by me on the following activities: development of treatment plan with patient and/or surrogate as well as nursing, discussions with consultants, evaluation of patient's response to treatment, examination of patient, obtaining history from patient or surrogate, ordering and performing treatments and interventions, ordering and review of laboratory studies, ordering and review of radiographic studies, pulse oximetry and re-evaluation of patient's condition.   Gerhard Munch, MD 03/25/15 2036

## 2015-03-25 NOTE — ED Notes (Signed)
Patient returned from X-ray 

## 2015-03-25 NOTE — ED Notes (Signed)
Patient transported to X-ray 

## 2015-03-25 NOTE — H&P (Signed)
History and Physical   Admit date: 03/25/2015 Name:  Randy Durham Medical record number: [id number] DOB/Age:  1934/08/15  81 y.o. male  [facility]  [facility]   [facility]   Chief complaint/reason for admission: Prolonged chest pain  HPI:  This very nice 81 year old male [facility]  He has a long-standing history of hypertension, hyperlipidemia and has a history of spinal stenosis for which she has been treated recently.  There is no prior cardiac history.  He was eating lunch with his wife today and had the onset of midsternal chest pain associated with diaphoresis and sweating that lasted about 15 minutes.  He took Pepto-Bismol and then took 4 aspirin and the discomfort abated somewhat but EMS was called and he was transported here.  He was given additional morphine after arrival here and has a mildly elevated initial troponin.  EKG shows an IV conduction delay type undetermined and he had a Q wave noted in V2 but no ischemic changes.  There is a question of subtle ST elevation in the anterior leads.  He currently is not in any acute distress but does complain of mild tightness.  He does not really have a prior cardiac history and [place]  His son had a myocardial infarction at age 79.  Of note he developed anemia the past year and had a colonoscopy and a endoscopy and was treated with iron.  He also has erectile dysfunction and takes silendafil,  his last dose was about 3 days ago.  He denies PND, orthopnea or significant claudication.  He does have mild edema involving his right leg.  As has been told of abnormal renal function [facility]   Past Medical History  Diagnosis Date  . Hypertension   . Hyperlipidemia   . Thyroid nodule   . Spinal stenosis   . Anemia   . Obesity (BMI 30-39.9)   . Chronic kidney disease (CKD), stage  III (moderate)   . Hypothyroidism      Past Surgical History  Procedure Laterality Date  . Tonsillectomy    . Pilonidal cyst excision     Allergies: has No Known Allergies.   Medications: Prior to Admission medications   Not on File  Pending prior pharmacists review.  He is on atenolol, lisinopril/HCTZ, atorvastatin, tamsulosin  Family History:  Family Status  Relation Status Death Age  . Father Deceased 8483    died of MI, prostate cancer  . Mother Deceased 5646    pneumonia, psychiatric problems  . Sister Deceased 6670    CAD, CABG  . Sister Deceased 2875    congestive heart failure    Social History:   reports that he has quit smoking. His smoking use included Cigars and Pipe. He has never used smokeless tobacco. He reports that he drinks alcohol. He reports that he does not use illicit drugs.   History   Social History Narrative   Retired Psychologist, educationalexecutive for [employer]     Review of Systems:  He has gained some weight recently.  He has some early cataracts.  He normally has no shortness of breath but does have seasonal allergies.  He denies PND, orthopnea or edema.  He has mild nocturia and has symptoms of prostatism and evidently has an elevated PSA.  He has been treated for severe  spinal stenosis and has significant injections and has received some physical therapy.  He has mild edema involving his right leg.  He has known erectile dysfunction. Other than as noted above, the remainder of the review of systems is normal  Physical Exam: BP 140/74 mmHg  Pulse 52  Temp(Src) 97.5 F (36.4 C) (Oral)  Resp 23  Ht  (1.6 m)  Wt 83.462 kg (184 lb)  BMI 32.60 kg/m2  SpO2 100% General appearance: Very pleasant obese male currently in no acute distress Head: Normocephalic, without obvious abnormality, atraumatic Eyes: conjunctivae/corneas clear. PERRL, EOM's intact. Fundi not examined  Neck: no adenopathy, no carotid bruit, no JVD and supple, symmetrical, trachea  midline Lungs: clear to auscultation bilaterally Heart: regular rate and rhythm, S1, S2 normal, no murmur, click, rub or gallop Abdomen: soft, non-tender; bowel sounds normal; no masses,  no organomegaly Rectal: deferred Extremities: Extremities without deformity, range of motion is normal, 1+ edema on the right leg, changes of chronic venous insufficiency are noted.  No clubbing or cyanosis. Pulses: 2+ and symmetric Skin: Skin color, texture, turgor normal. No rashes or lesions Neurologic: Grossly normal  Labs: CBC  Recent Labs  03/25/15 1648  WBC 8.6  RBC 5.35  HGB 15.4  HCT 44.9  PLT 237  MCV 83.9  MCH 28.8  MCHC 34.3  RDW 12.3   CMP   Recent Labs  03/25/15 1648  NA 135  K 4.0  CL 99*  CO2 28  GLUCOSE 90  BUN 24*  CREATININE 1.47*  CALCIUM 9.3  PROT 6.9  ALBUMIN 4.1  AST 40  ALT 35  ALKPHOS 55  BILITOT 0.8  GFRNONAA 44*  GFRAA 51*   Cardiac Panel (last 3 results)  Recent Labs  03/25/15 1648  TROPONINI 0.48*     EKG: Normal sinus rhythm IV conduction delay type undetermined, Q wave in V2, slight ST elevation in V2  Radiology: Opacities left lung base   IMPRESSIONS: 1.  Prolonged chest discomfort suggestive of myocardial ischemia with elevation of troponin 2.  Non-STEMI-acute episode 3.  Hypertension 4.  Hyperlipidemia under treatment 5.  Erectile dysfunction 6.  Spinal stenosis 7.  Obesity 8.  Recent history of iron deficiency anemia with GI workup revealing no certain cause  PLAN: Patient has some mild residual chest tightness will be started on intravenous nitroglycerin.  Serial troponins will be cycled.  He will need to go ahead and proceed with cardiac catheterization to evaluate for coronary artery disease. Cardiac catheterization procedure was discussed with the patient fully including risks of myocardial infarction, death, stroke, bleeding, arrhythmia, dye allergy, or renal insufficiency. The patient understands and is willing to  proceed. Possibility of intervention including stenting also discussed with patient Signed: W. Ashley Royalty MD [facility]  03/25/2015, 7:20 PM

## 2015-03-25 NOTE — ED Notes (Signed)
Ntg drip started per order at lowest dose.  Rating pain at 1/10.  No distress noted or complaints voiced at this time.  Encouraged to call for assistance as needed.

## 2015-03-25 NOTE — Progress Notes (Signed)
ANTICOAGULATION CONSULT NOTE - Initial Consult  Pharmacy Consult for heparin Indication: chest pain/ACS  No Known Allergies  Patient Measurements: Height: 5\' 3"  (160 cm) Weight: 184 lb (83.462 kg) IBW/kg (Calculated) : 56.9 Heparin Dosing Weight: 75 kg  Vital Signs: Temp: 97.5 F (36.4 C) (05/22 1638) Temp Source: Oral (05/22 1638) BP: 144/71 mmHg (05/22 1701) Pulse Rate: 59 (05/22 1701)  Labs:  Recent Labs  03/25/15 1648  HGB 15.4  HCT 44.9  PLT 237  LABPROT 13.9  INR 1.05    CrCl cannot be calculated (Patient has no serum creatinine result on file.).   Medical History: Past Medical History  Diagnosis Date  . Hypertension   . Hypercholesteremia   . Thyroid disease   . Arthritis   . Anemia     Medications:   (Not in a hospital admission) Scheduled:  .  morphine injection  4 mg Intravenous Once   Infusions:  . sodium chloride 1,000 mL (03/25/15 1659)  . heparin      Assessment: 81 yo who presented with CP. He has elevated troponin. Heparin has been ordered to r/o MI. INR is normal.   Goal of Therapy:  Heparin level 0.3-0.7 units/ml Monitor platelets by anticoagulation protocol: Yes   Plan:   Heparin 4000 units x1 Heparin drip at 1150 units/hr F/u with 8 hr heparin level Daily level and CBC  Ulyses SouthwardMinh Pham, PharmD Pager: [phone] 03/25/2015 5:59 PM

## 2015-03-25 NOTE — ED Notes (Signed)
Cardiology, MD at bedside. 

## 2015-03-25 NOTE — ED Notes (Signed)
Acute onset of midsternal chest pain at 1405. Diaphoretic, then cool and clammy. Lasted about 15 minutes. At home patient took tums, pepto bismal, and (4) 81mg  ASA. Pain subsided after this. Not given nitro in transit by EMS due to E/D medication use recently. Reports a very slight discomfort on arrival.

## 2015-03-25 NOTE — ED Notes (Signed)
Pt placed in a gown and hooked up to 5 lead, BP cuff and pulse ox 

## 2015-03-25 NOTE — Progress Notes (Signed)
CRITICAL VALUE ALERT  Critical value received:  Troponin 3.25  Date of notification:  03/25/15  Time of notification:  2304  Critical value read back: yes  Nurse who received alert:  Armida SansAlyce Giuseppe Duchemin   MD notified (1st page):  Donnie Ahoilley, MD  Time of first page:  2316  MD notified (2nd page):  Time of second page:  Responding MD:  Donnie Ahoilley, MD  Time MD responded:  2321  Patient denies any chest pain. No new orders received. Will continue to monitor patient closely.

## 2015-03-26 ENCOUNTER — Encounter (HOSPITAL_COMMUNITY)
Admission: EM | Disposition: A | Discharge status: Home / Self Care | Payer: Medicare Other | Source: Home / Self Care | Attending: Internal Medicine

## 2015-03-26 ENCOUNTER — Inpatient Hospital Stay (HOSPITAL_COMMUNITY): Payer: Medicare Other

## 2015-03-26 DIAGNOSIS — I213 ST elevation (STEMI) myocardial infarction of unspecified site: Secondary | ICD-10-CM

## 2015-03-26 DIAGNOSIS — I10 Essential (primary) hypertension: Secondary | ICD-10-CM

## 2015-03-26 DIAGNOSIS — I214 Non-ST elevation (NSTEMI) myocardial infarction: Principal | ICD-10-CM

## 2015-03-26 HISTORY — PX: CARDIAC CATHETERIZATION: SHX172

## 2015-03-26 LAB — BASIC METABOLIC PANEL
Anion gap: 8 (ref 5–15)
BUN: 18 mg/dL (ref 6–20)
CO2: 25 mmol/L (ref 22–32)
Calcium: 8.9 mg/dL (ref 8.9–10.3)
Chloride: 102 mmol/L (ref 101–111)
Creatinine, Ser: 1.09 mg/dL (ref 0.61–1.24)
GFR calc Af Amer: >60 mL/min (ref >60)
GLUCOSE: 91 mg/dL (ref 65–99)
Potassium: 4.6 mmol/L (ref 3.5–5.1)
SODIUM: 135 mmol/L (ref 135–145)

## 2015-03-26 LAB — LIPID PANEL
CHOLESTEROL: 155 mg/dL (ref 0–200)
HDL: 40 mg/dL — ABNORMAL LOW (ref >40)
LDL CALC: 93 mg/dL (ref 0–99)
TRIGLYCERIDES: 112 mg/dL (ref <150)
Total CHOL/HDL Ratio: 3.9 RATIO
VLDL: 22 mg/dL (ref 0–40)

## 2015-03-26 LAB — MRSA PCR SCREENING: MRSA by PCR: NEGATIVE

## 2015-03-26 LAB — TROPONIN I
TROPONIN I: 9.38 ng/mL — AB (ref <0.031)
Troponin I: 8.7 ng/mL (ref <0.031)

## 2015-03-26 LAB — HEPARIN LEVEL (UNFRACTIONATED)
HEPARIN UNFRACTIONATED: 0.49 [IU]/mL (ref 0.30–0.70)
Heparin Unfractionated: 0.47 IU/mL (ref 0.30–0.70)

## 2015-03-26 LAB — TSH: TSH: 0.217 u[IU]/mL — AB (ref 0.350–4.500)

## 2015-03-26 SURGERY — LEFT HEART CATH AND CORONARY ANGIOGRAPHY
Anesthesia: LOCAL

## 2015-03-26 MED ORDER — MORPHINE SULFATE 2 MG/ML IJ SOLN: 2.0000 mg | INTRAMUSCULAR | Status: DC | PRN

## 2015-03-26 MED ORDER — MIDAZOLAM HCL 2 MG/2ML IJ SOLN
INTRAMUSCULAR | Status: AC
  Filled 2015-03-26: qty 2

## 2015-03-26 MED ORDER — SODIUM CHLORIDE 0.9 % IJ SOLN
3.0000 mL | Freq: Two times a day (BID) | INTRAMUSCULAR | Status: DC
  Administered 2015-03-26 – 2015-03-27 (×2): 3 mL via INTRAVENOUS

## 2015-03-26 MED ORDER — SODIUM CHLORIDE 0.9 % IJ SOLN
3.0000 mL | INTRAMUSCULAR | Status: DC | PRN
Start: 2015-03-26 — End: 2015-03-26

## 2015-03-26 MED ORDER — ASPIRIN EC 81 MG PO TBEC: 81.0000 mg | DELAYED_RELEASE_TABLET | Freq: Every day | ORAL | Status: DC

## 2015-03-26 MED ORDER — SODIUM CHLORIDE 0.9 % IV SOLN: 250.0000 mL | INTRAVENOUS | Status: DC | PRN

## 2015-03-26 MED ORDER — IOHEXOL 350 MG/ML SOLN
INTRAVENOUS | Status: DC | PRN
  Administered 2015-03-26: 80 mL via INTRA_ARTERIAL

## 2015-03-26 MED ORDER — FENTANYL CITRATE (PF) 100 MCG/2ML IJ SOLN
INTRAMUSCULAR | Status: AC
  Filled 2015-03-26: qty 2

## 2015-03-26 MED ORDER — ATORVASTATIN CALCIUM 80 MG PO TABS
80.0000 mg | ORAL_TABLET | Freq: Every day | ORAL | Status: DC
  Administered 2015-03-27: 80 mg via ORAL
  Filled 2015-03-26: qty 1

## 2015-03-26 MED ORDER — ASPIRIN 81 MG PO CHEW: 81.0000 mg | CHEWABLE_TABLET | Freq: Every day | ORAL | Status: DC

## 2015-03-26 MED ORDER — SODIUM CHLORIDE 0.9 % IJ SOLN
3.0000 mL | Freq: Two times a day (BID) | INTRAMUSCULAR | Status: DC
  Administered 2015-03-26: 3 mL via INTRAVENOUS

## 2015-03-26 MED ORDER — SODIUM CHLORIDE 0.9 % IJ SOLN: 3.0000 mL | INTRAMUSCULAR | Status: DC | PRN

## 2015-03-26 MED ORDER — ATORVASTATIN CALCIUM 20 MG PO TABS: 20.0000 mg | ORAL_TABLET | Freq: Every day | ORAL | Status: DC

## 2015-03-26 MED ORDER — LISINOPRIL-HYDROCHLOROTHIAZIDE 20-12.5 MG PO TABS: 1.0000 | ORAL_TABLET | Freq: Every day | ORAL | Status: DC

## 2015-03-26 MED ORDER — VERAPAMIL HCL 2.5 MG/ML IV SOLN
INTRAVENOUS | Status: AC
  Filled 2015-03-26: qty 2

## 2015-03-26 MED ORDER — SODIUM CHLORIDE 0.9 % WEIGHT BASED INFUSION
3.0000 mL/kg/h | INTRAVENOUS | Status: AC
  Administered 2015-03-26: 3 mL/kg/h via INTRAVENOUS

## 2015-03-26 MED ORDER — SODIUM CHLORIDE 0.9 % IJ SOLN: 3.0000 mL | Freq: Two times a day (BID) | INTRAMUSCULAR | Status: DC

## 2015-03-26 MED ORDER — ACETAMINOPHEN 325 MG PO TABS: 650.0000 mg | ORAL_TABLET | ORAL | Status: DC | PRN

## 2015-03-26 MED ORDER — HEPARIN SODIUM (PORCINE) 1000 UNIT/ML IJ SOLN
INTRAMUSCULAR | Status: DC | PRN
  Administered 2015-03-26: 4500 [IU] via INTRAVENOUS

## 2015-03-26 MED ORDER — LIDOCAINE HCL (PF) 1 % IJ SOLN
INTRAMUSCULAR | Status: AC
  Filled 2015-03-26: qty 30

## 2015-03-26 MED ORDER — VERAPAMIL HCL 2.5 MG/ML IV SOLN
INTRAVENOUS | Status: DC | PRN
  Administered 2015-03-26: 16:00:00 via INTRA_ARTERIAL

## 2015-03-26 MED ORDER — METOPROLOL TARTRATE 25 MG PO TABS
25.0000 mg | ORAL_TABLET | Freq: Two times a day (BID) | ORAL | Status: DC
  Administered 2015-03-26 (×2): 25 mg via ORAL
  Filled 2015-03-26 (×3): qty 1

## 2015-03-26 MED ORDER — PANTOPRAZOLE SODIUM 40 MG PO TBEC
40.0000 mg | DELAYED_RELEASE_TABLET | Freq: Every day | ORAL | Status: DC
  Administered 2015-03-26 – 2015-03-28 (×3): 40 mg via ORAL
  Filled 2015-03-26 (×3): qty 1

## 2015-03-26 MED ORDER — HYDROCHLOROTHIAZIDE 12.5 MG PO CAPS
12.5000 mg | ORAL_CAPSULE | Freq: Every day | ORAL | Status: DC
  Administered 2015-03-26 – 2015-03-27 (×2): 12.5 mg via ORAL
  Filled 2015-03-26 (×2): qty 1

## 2015-03-26 MED ORDER — ASPIRIN 81 MG PO CHEW
81.0000 mg | CHEWABLE_TABLET | Freq: Every day | ORAL | Status: DC
  Administered 2015-03-26 – 2015-03-28 (×3): 81 mg via ORAL
  Filled 2015-03-26 (×3): qty 1

## 2015-03-26 MED ORDER — TAMSULOSIN HCL 0.4 MG PO CAPS
0.4000 mg | ORAL_CAPSULE | Freq: Every day | ORAL | Status: DC
  Administered 2015-03-27 – 2015-03-28 (×2): 0.4 mg via ORAL
  Filled 2015-03-26 (×2): qty 1

## 2015-03-26 MED ORDER — NITROGLYCERIN 1 MG/10 ML FOR IR/CATH LAB
INTRA_ARTERIAL | Status: AC
  Filled 2015-03-26: qty 10

## 2015-03-26 MED ORDER — HEPARIN (PORCINE) IN NACL 2-0.9 UNIT/ML-% IJ SOLN
INTRAMUSCULAR | Status: AC
  Filled 2015-03-26: qty 1000

## 2015-03-26 MED ORDER — NITROGLYCERIN 0.4 MG SL SUBL: 0.4000 mg | SUBLINGUAL_TABLET | SUBLINGUAL | Status: DC | PRN

## 2015-03-26 MED ORDER — SODIUM CHLORIDE 0.9 % IV SOLN
250.0000 mL | INTRAVENOUS | Status: DC | PRN
Start: 2015-03-26 — End: 2015-03-26

## 2015-03-26 MED ORDER — ONDANSETRON HCL 4 MG/2ML IJ SOLN
4.0000 mg | Freq: Four times a day (QID) | INTRAMUSCULAR | Status: DC | PRN
  Administered 2015-03-26: 4 mg via INTRAVENOUS
  Filled 2015-03-26: qty 2

## 2015-03-26 MED ORDER — ASPIRIN 81 MG PO CHEW: 81.0000 mg | CHEWABLE_TABLET | ORAL | Status: DC

## 2015-03-26 MED ORDER — SODIUM CHLORIDE 0.9 % IV SOLN: INTRAVENOUS | Status: DC

## 2015-03-26 MED ORDER — LISINOPRIL 10 MG PO TABS
20.0000 mg | ORAL_TABLET | Freq: Every day | ORAL | Status: DC
  Administered 2015-03-26 – 2015-03-27 (×2): 20 mg via ORAL
  Filled 2015-03-26 (×2): qty 2

## 2015-03-26 MED ORDER — ONDANSETRON HCL 4 MG/2ML IJ SOLN: 4.0000 mg | Freq: Four times a day (QID) | INTRAMUSCULAR | Status: DC | PRN

## 2015-03-26 SURGICAL SUPPLY — 14 items
CATH INFINITI 5 FR JL3.5 (CATHETERS) IMPLANT
CATH INFINITI 5FR ANG PIGTAIL (CATHETERS) ×2 IMPLANT
CATH INFINITI JR4 5F (CATHETERS) IMPLANT
CATH OPTITORQUE TIG 4.0 5F (CATHETERS) ×2 IMPLANT
CATH SITESEER 5F MULTI A 2 (CATHETERS) IMPLANT
DEVICE RAD COMP TR BAND LRG (VASCULAR PRODUCTS) ×2 IMPLANT
GLIDESHEATH SLEND A-KIT 6F 22G (SHEATH) ×2 IMPLANT
KIT HEART LEFT (KITS) ×2 IMPLANT
PACK CARDIAC CATHETERIZATION (CUSTOM PROCEDURE TRAY) ×2 IMPLANT
SHEATH PINNACLE 5F 10CM (SHEATH) IMPLANT
TRANSDUCER W/STOPCOCK (MISCELLANEOUS) ×2 IMPLANT
TUBING CIL FLEX 10 FLL-RA (TUBING) ×2 IMPLANT
WIRE EMERALD 3MM-J .035X150CM (WIRE) IMPLANT
WIRE SAFE-T 1.5MM-J .035X260CM (WIRE) ×2 IMPLANT

## 2015-03-26 NOTE — Progress Notes (Signed)
TR BAND REMOVAL  LOCATION:    right radial  DEFLATED PER PROTOCOL:    Yes.    TIME BAND OFF / DRESSING APPLIED:    1900   SITE UPON ARRIVAL:    Level 0  SITE AFTER BAND REMOVAL:    Level 0  REVERSE ALLEN'S TEST:     positive  CIRCULATION SENSATION AND MOVEMENT:    Within Normal Limits   Yes.    COMMENTS:   Tr band removed and dressing applied. Post band removal instructions given to pt and family who are present,.

## 2015-03-26 NOTE — Progress Notes (Signed)
03/26/2015 patient heart rate at 1400 was in the mid 40's to 50's. Dr Clifton JamesMcalhany was made aware. [facility] RN.

## 2015-03-26 NOTE — Progress Notes (Signed)
     SUBJECTIVE: Mild chest discomfort this am. No SOB  BP 140/69 mmHg  Pulse 65  Temp(Src) 98.3 F (36.8 C) (Oral)  Resp 19  Ht 5\' 2"  (1.575 m)  Wt 191 lb 2.2 oz (86.7 kg)  BMI 34.95 kg/m2  SpO2 95%  Intake/Output Summary (Last 24 hours) at 03/26/15 0741 Last data filed at 03/26/15 16100619  Gross per 24 hour  Intake 2912.21 ml  Output    475 ml  Net 2437.21 ml    PHYSICAL EXAM General: Well developed, well nourished, in no acute distress. Alert and oriented x 3.  Psych:  Good affect, responds appropriately Neck: No JVD. No masses noted.  Lungs: Clear bilaterally with no wheezes or rhonci noted.  Heart: RRR with no murmurs noted. Abdomen: Bowel sounds are present. Soft, non-tender.  Extremities: No lower extremity edema.   LABS: Basic Metabolic Panel:  Recent Labs  96/02/5404/22/16 1648  NA 135  K 4.0  CL 99*  CO2 28  GLUCOSE 90  BUN 24*  CREATININE 1.47*  CALCIUM 9.3  MG 2.1   CBC:  Recent Labs  03/25/15 1648  WBC 8.6  HGB 15.4  HCT 44.9  MCV 83.9  PLT 237   Cardiac Enzymes:  Recent Labs  03/25/15 1648 03/25/15 2112 03/26/15 0246  TROPONINI 0.48* 3.25* 8.70*   Fasting Lipid Panel:  Recent Labs  03/26/15 0246  CHOL 155  HDL 40*  LDLCALC 93  TRIG [id number]  CHOLHDL 3.9    Current Meds: . [START ON 03/27/2015] aspirin EC  81 mg Oral Daily  . atorvastatin  40 mg Oral q1800  . metoprolol tartrate  25 mg Oral BID  . sodium chloride  3 mL Intravenous Q12H    ASSESSMENT AND PLAN:  1. NSTEMI:  Pt admitted with chest pain consistent with unstable angina. No prior cardiac history. Troponin 8.7 this am and continues to rise. He has less chest pressure this am. Still with mild pain. Will continue IV NTG, IV heparin, beta blocker, ASA and statin. Cardiac cath later today. Clear liquid breakfast then NPO. I have reviewed the risks and benefits of cath with the patient and he agrees to proceed. BMET pending this am.   Randy Durham  5/23/20167:41  AM

## 2015-03-26 NOTE — Plan of Care (Signed)
Problem: Phase I Progression Outcomes Goal: Hemodynamically stable Vital signs stable, patient in no distress

## 2015-03-26 NOTE — Plan of Care (Signed)
Problem: Phase I Progression Outcomes Goal: MD aware of Cardiac Marker results Outcome: Progressing MD notified of elevated troponin levels.

## 2015-03-26 NOTE — H&P (View-Only) (Signed)
     SUBJECTIVE: Mild chest discomfort this am. No SOB  BP 140/69 mmHg  Pulse 65  Temp(Src) 98.3 F (36.8 C) (Oral)  Resp 19  Ht 5' 2" (1.575 m)  Wt 191 lb 2.2 oz (86.7 kg)  BMI 34.95 kg/m2  SpO2 95%  Intake/Output Summary (Last 24 hours) at 03/26/15 0741 Last data filed at 03/26/15 0619  Gross per 24 hour  Intake 2912.21 ml  Output    475 ml  Net 2437.21 ml    PHYSICAL EXAM General: Well developed, well nourished, in no acute distress. Alert and oriented x 3.  Psych:  Good affect, responds appropriately Neck: No JVD. No masses noted.  Lungs: Clear bilaterally with no wheezes or rhonci noted.  Heart: RRR with no murmurs noted. Abdomen: Bowel sounds are present. Soft, non-tender.  Extremities: No lower extremity edema.   LABS: Basic Metabolic Panel:  Recent Labs  03/25/15 1648  NA 135  K 4.0  CL 99*  CO2 28  GLUCOSE 90  BUN 24*  CREATININE 1.47*  CALCIUM 9.3  MG 2.1   CBC:  Recent Labs  03/25/15 1648  WBC 8.6  HGB 15.4  HCT 44.9  MCV 83.9  PLT 237   Cardiac Enzymes:  Recent Labs  03/25/15 1648 03/25/15 2112 03/26/15 0246  TROPONINI 0.48* 3.25* 8.70*   Fasting Lipid Panel:  Recent Labs  03/26/15 0246  CHOL 155  HDL 40*  LDLCALC 93  TRIG 112  CHOLHDL 3.9    Current Meds: . [START ON 03/27/2015] aspirin EC  81 mg Oral Daily  . atorvastatin  40 mg Oral q1800  . metoprolol tartrate  25 mg Oral BID  . sodium chloride  3 mL Intravenous Q12H    ASSESSMENT AND PLAN:  1. NSTEMI:  Pt admitted with chest pain consistent with unstable angina. No prior cardiac history. Troponin 8.7 this am and continues to rise. He has less chest pressure this am. Still with mild pain. Will continue IV NTG, IV heparin, beta blocker, ASA and statin. Cardiac cath later today. Clear liquid breakfast then NPO. I have reviewed the risks and benefits of cath with the patient and he agrees to proceed. BMET pending this am.   Gearld Kerstein  5/23/20167:41  AM  

## 2015-03-26 NOTE — Progress Notes (Signed)
ANTICOAGULATION CONSULT NOTE - Follow up  Pharmacy Consult for heparin Indication: chest pain/ACS  No Known Allergies  Patient Measurements: Height:  (157.5 cm) Weight: 190 lb 4.1 oz (86.3 kg) IBW/kg (Calculated) : 54.6 Heparin Dosing Weight: 75 kg  Vital Signs: Temp: 97.3 F (36.3 C) (05/23 0004) Temp Source: Oral (05/23 0004) BP: 116/61 mmHg (05/23 0200) Pulse Rate: 64 (05/23 0200)  Labs:  Recent Labs  03/25/15 1648 03/25/15 2112 03/26/15 0135  HGB 15.4  --   --   HCT 44.9  --   --   PLT 237  --   --   LABPROT 13.9  --   --   INR 1.05  --   --   HEPARINUNFRC  --   --  0.49  CREATININE 1.47*  --   --   TROPONINI 0.48* 3.25*  --     Estimated Creatinine Clearance: 39.4 mL/min (by C-G formula based on Cr of 1.47).   Medical History: Past Medical History  Diagnosis Date  . Hypertension   . Hyperlipidemia   . Thyroid nodule   . Spinal stenosis   . Anemia   . Obesity (BMI 30-39.9)   . Chronic kidney disease (CKD), stage III (moderate)   . Hypothyroidism     Medications:  Prescriptions prior to admission  Medication Sig Dispense Refill Last Dose  . acetaminophen (TYLENOL) 325 MG tablet Take 325 mg by mouth every 6 (six) hours as needed (pain).   few weeks ago  . atenolol (TENORMIN) 25 MG tablet Take 12.5 mg by mouth at bedtime.   03/24/2015 at 2300  . atorvastatin (LIPITOR) 40 MG tablet Take 20 mg by mouth at bedtime.   03/24/2015 at Unknown time  . Bioflavonoid Products (ESTER-C/BIOFLAVONOIDS) TABS Take 0.5 tablets by mouth daily. 1/2 tablet:  250 mg Vitamin C, 27.5 mg Calcium Ascorbate, 26 mg Citrus Bioflavonoid Complex   03/25/2015 at Unknown time  . ferrous fumarate (FERRO-SEQUELS) 18 MG CR tablet Take 18 mg by mouth daily.   03/25/2015 at Unknown time  . fluticasone (FLONASE) 50 MCG/ACT nasal spray Place 1 spray into both nostrils daily as needed (seasonal allergies).   03/24/2015 at Unknown time  . ibuprofen (ADVIL,MOTRIN) 200 MG tablet Take 200-400 mg  by mouth every 6 (six) hours as needed (pain).   week ago  . levothyroxine (SYNTHROID, LEVOTHROID) 125 MCG tablet Take 125 mcg by mouth daily.   03/25/2015 at Unknown time  . lisinopril-hydrochlorothiazide (PRINZIDE,ZESTORETIC) 20-12.5 MG per tablet Take 1 tablet by mouth at bedtime.   03/24/2015 at Unknown time  . loratadine (CLARITIN) 10 MG tablet Take 10 mg by mouth daily.   03/24/2015 at Unknown time  . Menthol-Zinc Oxide (GOLD BOND EX) Apply 1 application topically at bedtime.   03/24/2015 at Unknown time  . omeprazole (PRILOSEC) 20 MG capsule Take 20 mg by mouth daily.   03/25/2015 at Unknown time  . sildenafil (VIAGRA) 50 MG tablet Take 25 mg by mouth daily as needed for erectile dysfunction. Take 1 hour prior to sexual activity ( do not exceed 1 dose per 24 hour period)   03/21/2015  . tamsulosin (FLOMAX) 0.4 MG CAPS capsule Take 0.4 mg by mouth daily.   03/25/2015 at Unknown time   Scheduled:  . [START ON 03/27/2015] aspirin EC  81 mg Oral Daily  . atorvastatin  40 mg Oral q1800  . metoprolol tartrate  25 mg Oral BID  . sodium chloride  3 mL Intravenous Q12H   Infusions:  .  sodium chloride 1,000 mL (03/26/15 0112)  . heparin 1,150 Units/hr (03/25/15 1811)  . nitroGLYCERIN 3 mcg/min (03/25/15 1944)    Assessment: Heparin level = 0.49 on 1150 units/hr in this 81 yo who presented with CP and has elevated troponin. Heparin was ordered to r/o MI.  Initial heparin level is therapeutic. No bleeding reported.  Goal of Therapy:  Heparin level 0.3-0.7 units/ml Monitor platelets by anticoagulation protocol: Yes   Plan:  Continue Heparin drip at 1150 units/hr F/u with 8 hr heparin level Daily level and CBC   Noah Delaineuth Honesti Seaberg, RPh Clinical Pharmacist Pager: [phone] 03/26/2015 2:49 AM

## 2015-03-26 NOTE — Progress Notes (Signed)
ANTICOAGULATION CONSULT NOTE - FOLLOW UP  Pharmacy Consult:  Heparin Indication: chest pain/ACS  No Known Allergies  Patient Measurements: Height: 5\' 2"  (157.5 cm) Weight: 191 lb 2.2 oz (86.7 kg) IBW/kg (Calculated) : 54.6 Heparin Dosing Weight: 75 kg  Vital Signs: Temp: 98.3 F (36.8 C) (05/23 0723) Temp Source: Oral (05/23 0723) BP: 129/77 mmHg (05/23 0941) Pulse Rate: 59 (05/23 0941)  Labs:  Recent Labs  03/25/15 1648 03/25/15 2112 03/26/15 0135 03/26/15 0246 03/26/15 0930  HGB 15.4  --   --   --   --   HCT 44.9  --   --   --   --   PLT 237  --   --   --   --   LABPROT 13.9  --   --   --   --   INR 1.05  --   --   --   --   HEPARINUNFRC  --   --  0.49  --  0.47  CREATININE 1.47*  --   --   --   --   TROPONINI 0.48* 3.25*  --  8.70* 9.38*    Estimated Creatinine Clearance: 39.5 mL/min (by C-G formula based on Cr of 1.47).      Assessment: Randy Durham with chest pain to continue on IV heparin for ACS.  Troponin continues to trend up.  Heparin level remains therapeutic; no bleeding reported.  Cath later today.   Goal of Therapy:  Heparin level 0.3-0.7 units/ml Monitor platelets by anticoagulation protocol: Yes    Plan:  - Continue heparin gtt at 1150 units/hr - Daily HL / CBC - F/U post cath - Consider checking a free T4 level, resume home meds     Korvin Valentine D. Laney Potashang, PharmD, BCPS Pager:  [phone] 03/26/2015, 11:10 AM

## 2015-03-26 NOTE — Plan of Care (Signed)
Problem: Phase I Progression Outcomes Goal: Voiding-avoid urinary catheter unless indicated Outcome: Completed/Met Date Met:  03/26/15 Patient using urinal as needed and bathroom privileges.

## 2015-03-26 NOTE — Progress Notes (Signed)
Echocardiogram 2D Echocardiogram has been performed.  Dorothey BasemanReel, Ara Mano M 03/26/2015, 11:59 AM

## 2015-03-26 NOTE — Plan of Care (Signed)
Problem: Phase I Progression Outcomes Goal: Anginal pain relieved Outcome: Progressing Patient denies any chest pain

## 2015-03-26 NOTE — Interval H&P Note (Signed)
Cath Lab Visit (complete for each Cath Lab visit)  Clinical Evaluation Leading to the Procedure:   ACS: Yes.    Non-ACS:    Anginal Classification: CCS IV  Anti-ischemic medical therapy: Minimal Therapy (1 class of medications)  Non-Invasive Test Results: No non-invasive testing performed  Prior CABG: No previous CABG      History and Physical Interval Note:  03/26/2015 4:03 PM  Randy Durham  has presented today for surgery, with the diagnosis of unstable angina  The various methods of treatment have been discussed with the patient and family. After consideration of risks, benefits and other options for treatment, the patient has consented to  Procedure(s): Left Heart Cath and Coronary Angiography (N/A) as a surgical intervention .  The patient's history has been reviewed, patient examined, no change in status, stable for surgery.  I have reviewed the patient's chart and labs.  Questions were answered to the patient's satisfaction.     Nanetta BattyBerry, Jonathan

## 2015-03-26 NOTE — Progress Notes (Signed)
Utilization Review Completed.  

## 2015-03-27 ENCOUNTER — Encounter (HOSPITAL_COMMUNITY): Payer: Self-pay | Admitting: Cardiovascular Disease

## 2015-03-27 DIAGNOSIS — I4729 Other ventricular tachycardia: Secondary | ICD-10-CM

## 2015-03-27 DIAGNOSIS — I472 Ventricular tachycardia: Secondary | ICD-10-CM

## 2015-03-27 DIAGNOSIS — R001 Bradycardia, unspecified: Secondary | ICD-10-CM | POA: Diagnosis present

## 2015-03-27 DIAGNOSIS — I2510 Atherosclerotic heart disease of native coronary artery without angina pectoris: Secondary | ICD-10-CM | POA: Diagnosis present

## 2015-03-27 LAB — CBC
HEMATOCRIT: 40.2 % (ref 39.0–52.0)
HEMOGLOBIN: 13.6 g/dL (ref 13.0–17.0)
MCH: 28.8 pg (ref 26.0–34.0)
MCHC: 33.8 g/dL (ref 30.0–36.0)
MCV: 85.2 fL (ref 78.0–100.0)
Platelets: 184 10*3/uL (ref 150–400)
RBC: 4.72 MIL/uL (ref 4.22–5.81)
RDW: 12.5 % (ref 11.5–15.5)
WBC: 7.8 10*3/uL (ref 4.0–10.5)

## 2015-03-27 LAB — BASIC METABOLIC PANEL
ANION GAP: 5 (ref 5–15)
BUN: 17 mg/dL (ref 6–20)
CALCIUM: 8.8 mg/dL — AB (ref 8.9–10.3)
CO2: 25 mmol/L (ref 22–32)
Chloride: 105 mmol/L (ref 101–111)
Creatinine, Ser: 1.21 mg/dL (ref 0.61–1.24)
GFR calc Af Amer: >60 mL/min (ref >60)
GFR, EST NON AFRICAN AMERICAN: 56 mL/min — AB (ref >60)
Glucose, Bld: 92 mg/dL (ref 65–99)
Potassium: 4.2 mmol/L (ref 3.5–5.1)
SODIUM: 135 mmol/L (ref 135–145)

## 2015-03-27 MED ORDER — METOPROLOL TARTRATE 12.5 MG HALF TABLET
12.5000 mg | ORAL_TABLET | Freq: Two times a day (BID) | ORAL | Status: DC
  Administered 2015-03-27 – 2015-03-28 (×3): 12.5 mg via ORAL
  Filled 2015-03-27 (×3): qty 1

## 2015-03-27 MED FILL — Lidocaine HCl Local Preservative Free (PF) Inj 1%: INTRAMUSCULAR | Qty: 30 | Status: AC

## 2015-03-27 MED FILL — Heparin Sodium (Porcine) 2 Unit/ML in Sodium Chloride 0.9%: INTRAMUSCULAR | Qty: 1000 | Status: AC

## 2015-03-27 NOTE — Progress Notes (Signed)
Notified MD of NSVT and low heart rate patient  a symptomatic we discussed cath report from today. I will continue to monitor at this time.

## 2015-03-27 NOTE — Progress Notes (Signed)
Discussed with Dr Allyson SabalBerry, he is concerned the LAD occlusion was embolic. He suggests a 30 day event monitor at discharge  .Corine ShelterLUKE KILROY PA-C 03/27/2015 11:03 AM Agree with note written by Corine ShelterLuke Kilroy PAC  Nanetta BattyBerry, Harce Volden 03/27/2015 6:25 PM

## 2015-03-27 NOTE — Progress Notes (Signed)
Subjective:  No complaints of chest pain  Objective:  Vital Signs in the last 24 hours: Temp:  [97.7 F (36.5 C)-99.7 F (37.6 C)] 99.4 F (37.4 C) (05/24 0754) Pulse Rate:  [0-86] 61 (05/24 0754) Resp:  [0-24] 20 (05/24 0754) BP: (80-132)/(36-72) 124/72 mmHg (05/24 0754) SpO2:  [0 %-99 %] 95 % (05/24 0754) Weight:  [191 lb 9.3 oz (86.9 kg)] 191 lb 9.3 oz (86.9 kg) (05/24 0034)  Intake/Output from previous day:  Intake/Output Summary (Last 24 hours) at 03/27/15 1024 Last data filed at 03/26/15 1700  Gross per 24 hour  Intake    240 ml  Output    500 ml  Net   -260 ml    Physical Exam: General appearance: alert, cooperative and no distress Lungs: clear to auscultation bilaterally Heart: regular rate and rhythm Extremities: no edema   Rate: 45-60  Rhythm: normal sinus rhythm, sinus bradycardia and NSVT  Lab Results:  Recent Labs  03/25/15 1648 03/27/15 0350  WBC 8.6 7.8  HGB 15.4 13.6  PLT 237 184    Recent Labs  03/26/15 1254 03/27/15 0350  NA 135 135  K 4.6 4.2  CL 102 105  CO2 25 25  GLUCOSE 91 92  BUN 18 17  CREATININE 1.09 1.21    Recent Labs  03/26/15 0246 03/26/15 0930  TROPONINI 8.70* 9.38*    Recent Labs  03/25/15 1648  INR 1.05    Scheduled Meds: . aspirin  81 mg Oral Daily  . atorvastatin  80 mg Oral q1800  . lisinopril  20 mg Oral QHS   And  . hydrochlorothiazide  12.5 mg Oral QHS  . metoprolol tartrate  12.5 mg Oral BID  . pantoprazole  40 mg Oral Daily  . sodium chloride  3 mL Intravenous Q12H  . tamsulosin  0.4 mg Oral Daily   Continuous Infusions:  PRN Meds:.sodium chloride, acetaminophen, morphine injection, nitroGLYCERIN, ondansetron (ZOFRAN) IV, sodium chloride   Imaging: Dg Chest 2 View  03/25/2015   CLINICAL DATA:  Patient with left-sided chest pain.  EXAM: CHEST  2 VIEW  COMPARISON:  None.  FINDINGS: Normal cardiac and mediastinal contours with tortuosity of the thoracic aorta. Heterogeneous opacities  left lung base. Right lung is clear. Mid thoracic spine degenerative changes.  IMPRESSION: Minimal heterogeneous opacities left lung base may represent atelectasis, infection not excluded.   Electronically Signed   By: Annia Belt M.D.   On: 03/25/2015 17:37    Assessment/Plan:   Principal Problem:   Non-ST elevation myocardial infarction (NSTEMI), initial episode of care Active Problems:   Chronic kidney disease (CKD), stage III (moderate)   Hypertension   Bradycardia   NSVT (nonsustained ventricular tachycardia)   Obesity (BMI 30-39.9)   Spinal stenosis   Hyperlipidemia   PLAN: Pt seen by Dr Allyson Sabal this am. He has had bradycardia and NSVT runs overnight. He is asymptomatic. Plan is to keep him overnight, beta blocker decreased, [possible discharge in am.   Corine Shelter PA-C 03/27/2015, 10:24 AM [phone]  Agree with note written by Corine Shelter [facility]  Status post non-STEMI. Cath showed an occluded apical LAD with apical wall motion abnormality and preserved overall LV function. He did have a 75% proximal nondominant circumflex circumflex and moderate PDA disease.  The plan was to treat him medically. He had sinus bradycardia less than inflammatory as well as short runs of nonsustained ventricular tachycardia. He has been asymptomatic otherwise. His right radial artery puncture site is stable.  I'm going to cut back his beta blocker and monitor him overnight. He'll probably be discharged home in the morning. He'll need outpatient pharmacologic Myoview stress test to assess physiologic significance of the circumflex coronary artery stenosis.  Nanetta BattyBerry, Andreah Goheen 03/27/2015 10:32 AM

## 2015-03-27 NOTE — Progress Notes (Signed)
CARDIAC REHAB PHASE I   PRE:  Rate/Rhythm: 58 RA  BP:  Sitting: 124/72        SaO2: 95 RA  MODE:  Ambulation: 500 ft   POST:  Rate/Rhythm: 82 SR  BP:  Sitting: 133/95         SaO2: 95 RA  Pt ambulated 500 ft on RA, handheld assist, slow, mildly unsteady gait with occasional lean to the right, tolerated fair. Pt states he just completed outpatient physical therapy for spinal stenosis and states that he has been less steady since his diagnosis and has been walking less. Pt states he does not wish to see physical therapy during this hospital visit. Pt c/o of mild-moderate DOE, denies cp, dizziness, standing rest x 4. Completed MI education.  Reviewed intro to MI, activity restrictions, ntg, exercise, heart healthy diet, sodium restrictions, portion control, phase 2 cardiac rehab. Pt verbalized understanding. Pt agrees to phase 2 cardiac rehab. [facility].  Pt to recliner after walk, call bell within reach, watching MI video.     [phone]   Joylene GrapesMonge, Robinson Brinkley C, RN, BSN 03/27/2015 9:14 AM

## 2015-03-28 ENCOUNTER — Encounter (HOSPITAL_COMMUNITY): Payer: Self-pay | Admitting: Physician Assistant

## 2015-03-28 ENCOUNTER — Other Ambulatory Visit: Payer: Self-pay | Admitting: Physician Assistant

## 2015-03-28 DIAGNOSIS — I25119 Atherosclerotic heart disease of native coronary artery with unspecified angina pectoris: Secondary | ICD-10-CM

## 2015-03-28 DIAGNOSIS — I951 Orthostatic hypotension: Secondary | ICD-10-CM

## 2015-03-28 LAB — BASIC METABOLIC PANEL
Anion gap: 8 (ref 5–15)
BUN: 15 mg/dL (ref 6–20)
CO2: 26 mmol/L (ref 22–32)
Calcium: 8.7 mg/dL — ABNORMAL LOW (ref 8.9–10.3)
Chloride: 102 mmol/L (ref 101–111)
Creatinine, Ser: 1.36 mg/dL — ABNORMAL HIGH (ref 0.61–1.24)
GFR calc Af Amer: 56 mL/min — ABNORMAL LOW (ref >60)
GFR calc non Af Amer: 48 mL/min — ABNORMAL LOW (ref >60)
Glucose, Bld: 100 mg/dL — ABNORMAL HIGH (ref 65–99)
Potassium: 3.7 mmol/L (ref 3.5–5.1)
Sodium: 136 mmol/L (ref 135–145)

## 2015-03-28 MED ORDER — LISINOPRIL 10 MG PO TABS
10.0000 mg | ORAL_TABLET | Freq: Every day | ORAL | Status: DC
Start: 2015-03-28 — End: 2017-07-22

## 2015-03-28 MED ORDER — LISINOPRIL 10 MG PO TABS
10.0000 mg | ORAL_TABLET | Freq: Every day | ORAL | Status: DC
Start: 2015-03-28 — End: 2015-03-28

## 2015-03-28 MED ORDER — NITROGLYCERIN 0.4 MG SL SUBL: 0.4000 mg | SUBLINGUAL_TABLET | SUBLINGUAL | Status: DC | PRN

## 2015-03-28 MED ORDER — METOPROLOL TARTRATE 25 MG PO TABS: 12.5000 mg | ORAL_TABLET | Freq: Two times a day (BID) | ORAL | Status: DC

## 2015-03-28 MED ORDER — ASPIRIN 81 MG PO CHEW: 81.0000 mg | CHEWABLE_TABLET | Freq: Every day | ORAL | Status: DC

## 2015-03-28 MED ORDER — ATORVASTATIN CALCIUM 80 MG PO TABS: 80.0000 mg | ORAL_TABLET | Freq: Every day | ORAL | Status: AC

## 2015-03-28 NOTE — Discharge Summary (Signed)
Discharge Summary   Patient ID: Randy Durham,  MRN: [id number], DOB/AGE: May 10, 1934 81 y.o.  Admit date: 03/25/2015 Discharge date: 03/28/2015  Primary Care Provider: No primary care provider on file. Primary Cardiologist: Dr. Clifton JamesMcAlhany  Discharge Diagnoses Principal Problem:   NSTEMI 03/25/15 Active Problems:   Chronic kidney disease (CKD), stage III (moderate)   Obesity (BMI 30-39.9)   Spinal stenosis   Hypertension   Hyperlipidemia   Bradycardia   NSVT (nonsustained ventricular tachycardia)   CAD- occluded distal LAD, 75-80% CFX- medical Rx   Allergies No Known Allergies  Procedures  Echocardiogram Pending, done on 5/23, not shown in EPIC, contacted echo reader today to verify    Cardiac catheterization 03/26/2015 Coronary Findings    Dominance: Right   Left Anterior Descending   . Dist LAD lesion, 100% stenosed.   . First Diagonal Branch   . Ost 1st Diag to 1st Diag lesion, 80% stenosed. The lesion is type non-C, .     Ramus Intermedius   . Ramus lesion, 75% stenosed. The lesion is type non-C, tubular .     Right Coronary Artery   . Prox RCA lesion, 30% stenosed. The lesion is type non-C, tubular located at bend. The lesion was not previously treated. Pressure wire/FFR was not performed on the lesionIVUS was not performed on the lesion.   . Right Posterior Descending Artery   . RPDA-1 lesion, 60% stenosed. The lesion is type non-C, .   Marland Kitchen. RPDA-2 lesion, 60% stenosed. The lesion is type non-C, .     IMPRESSION:Randy Durham has occluded distal LAD probably embolic with an apical wall motion and a mallet and a preserved LV function. He does have a moderately high-grade proximal circumflex stenosis in the 75-80% range that fairly focal however prior to this event he was asymptomatic and I suspect that this lesion is not physiologic with significant or at least did not contribute to his infarct. We will continue to treat him medically and obtain an outpatient  Myoview to assess physiologic significance. The sheath was removed and a TR band was placed on the right wrist to achieve patient hemostasis. The patient left the cath lab in stable condition.     Hospital Course  Patient is a 81 year old male who [facility]. He has a long-standing history of hypertension, hyperlipidemia, however no past cardiac history. He was eating lunch with his wife on 5/22 when he had midsternal chest discomfort associated with diaphoresis which lasted about 15 minutes. He took Pepto-Bismol, 4 baby aspirin and the chest discomfort improved. EMS was called. Initial EKG showed AV conduction delay type undetermined and he had a Q-wave noted in V2 but no ischemic changes. There is possible subtle ST elevation in the anterior lead. Initial troponin was elevated at 0.48 which gradually trended up to 9.38.   He underwent planned cardiac catheterization on 03/26/2015 which showed 100% distal LAD stenosis, 80% ostial D1 stenosis, 75% ramus stenosis, 60% RPDA stenosis, 30% proximal RCA stenosis. Left ventricular size is normal, mild left ventricular systolic dysfunction, apical akinesis to dyskinesis. It was felt that distal LAD is probably embolic with apical wall motion and mobility. Although he has a high-grade proximal circumflex stenosis around 75%, however prior to this event he was asymptomatic and this lesion is likely not physiological with significant symptom to contribute to his infarct. Medical therapy was recommended. An outpatient Myoview was also recommended to assess for physiologic significance.  He was seen on the following day,  telemetry showed bradycardia and nonsustained VT overnight however he was asymptomatic. His beta blocker was decreased. It is also felt that his LAD occlusion is likely embolic, Dr. Allyson Sabal recommended a 30 day event monitor on discharge. He was seen in the morning of 03/28/2015 at which time he was doing well without significant  chest discomfort. He is deemed stable for discharge from cardiology perspective. He does have some hypotension overnight, his HCTZ has been discontinued. His lisinopril has been cut back to 10 mg daily. I have arranged outpatient 30 day event monitor, 1 week myoview, and very close followup after myoview. I will give him sublingual nitroglycerine and instruct patient to seek medication attention if has recurrent CP lasting more than 20 min. He has also been instructed not to take nitroglycerine within 24 hours of taking Viagara as the combination of the 2 can drop the BP signifciantly.   Discharge Vitals Blood pressure 92/43, pulse 70, temperature 97.7 F (36.5 C), temperature source Oral, resp. rate 18, height 5\' 2"  (1.575 m), weight 189 lb 2.5 oz (85.8 kg), SpO2 95 %.  Filed Weights   03/26/15 0410 03/27/15 0034 03/28/15 0044  Weight: 191 lb 2.2 oz (86.7 kg) 191 lb 9.3 oz (86.9 kg) 189 lb 2.5 oz (85.8 kg)    Labs  CBC  Recent Labs  03/25/15 1648 03/27/15 0350  WBC 8.6 7.8  HGB 15.4 13.6  HCT 44.9 40.2  MCV 83.9 85.2  PLT 237 184   Basic Metabolic Panel  Recent Labs  03/25/15 1648  03/27/15 0350 03/28/15 0353  NA 135  < > 135 136  K 4.0  < > 4.2 3.7  CL 99*  < > 105 102  CO2 28  < > 25 26  GLUCOSE 90  < > 92 100*  BUN 24*  < > 17 15  CREATININE 1.47*  < > 1.21 1.36*  CALCIUM 9.3  < > 8.8* 8.7*  MG 2.1  --   --   --   < > = values in this interval not displayed. Liver Function Tests  Recent Labs  03/25/15 1648  AST 40  ALT 35  ALKPHOS 55  BILITOT 0.8  PROT 6.9  ALBUMIN 4.1   No results for input(s): LIPASE, AMYLASE in the last 72 hours. Cardiac Enzymes  Recent Labs  03/25/15 2112 03/26/15 0246 03/26/15 0930  TROPONINI 3.25* 8.70* 9.38*   Fasting Lipid Panel  Recent Labs  03/26/15 0246  CHOL 155  HDL 40*  LDLCALC 93  TRIG 161  CHOLHDL 3.9   Thyroid Function Tests  Recent Labs  03/26/15 0246  TSH 0.217*    Disposition  Pt is being  discharged home today in good condition.  Follow-up Plans & Appointments      Follow-up Information    Follow up with [facility] On 04/05/2015.   Specialty:  Cardiology   Why:  1:15pm. Outpatient stress test, note at [facility] clinic (not [address]), no food or drink at least 6 hours prior to procedure. Sips of water with med ok. Avoid caffeine on the day of procedure.   Contact information:   [address] [place] [address] [phone]      Follow up with Norma Fredrickson, NP On 04/09/2015.   Specialties:  Nurse Practitioner, Interventional Cardiology, Cardiology, Radiology   Why:  10:30am. Cardiology followup   Contact information:   [address] [place] [address] [phone]  Follow up with [facility].   Specialty:  Cardiology   Why:  Office scheduler will contact you to arrange for outpatient event monitor to pick at [facility] st location   Contact information:   [address] [place] [address] [phone]      Discharge Medications    Medication List    STOP taking these medications        atenolol 25 MG tablet  Commonly known as:  TENORMIN     lisinopril-hydrochlorothiazide 20-12.5 MG per tablet  Commonly known as:  PRINZIDE,ZESTORETIC      TAKE these medications        acetaminophen 325 MG tablet  Commonly known as:  TYLENOL  Take 325 mg by mouth every 6 (six) hours as needed (pain).     aspirin 81 MG chewable tablet  Chew 1 tablet (81 mg total) by mouth daily.     atorvastatin 80 MG tablet  Commonly known as:  LIPITOR  Take 1 tablet (80 mg total) by mouth daily at 6 PM.     ESTER-C/BIOFLAVONOIDS Tabs  Take 0.5 tablets by mouth daily. 1/2 tablet:  250 mg Vitamin C, 27.5 mg Calcium Ascorbate, 26 mg Citrus Bioflavonoid Complex     ferrous fumarate 18 MG CR tablet  Commonly known as:  FERRO-SEQUELS  Take 18 mg by mouth daily.      fluticasone 50 MCG/ACT nasal spray  Commonly known as:  FLONASE  Place 1 spray into both nostrils daily as needed (seasonal allergies).     GOLD BOND EX  Apply 1 application topically at bedtime.     ibuprofen 200 MG tablet  Commonly known as:  ADVIL,MOTRIN  Take 200-400 mg by mouth every 6 (six) hours as needed (pain).     levothyroxine 125 MCG tablet  Commonly known as:  SYNTHROID, LEVOTHROID  Take 125 mcg by mouth daily.     lisinopril 10 MG tablet  Commonly known as:  PRINIVIL,ZESTRIL  Take 1 tablet (10 mg total) by mouth at bedtime.     loratadine 10 MG tablet  Commonly known as:  CLARITIN  Take 10 mg by mouth daily.     metoprolol tartrate 25 MG tablet  Commonly known as:  LOPRESSOR  Take 0.5 tablets (12.5 mg total) by mouth 2 (two) times daily.     nitroGLYCERIN 0.4 MG SL tablet  Commonly known as:  NITROSTAT  Place 1 tablet (0.4 mg total) under the tongue every 5 (five) minutes x 3 doses as needed for chest pain.     omeprazole 20 MG capsule  Commonly known as:  PRILOSEC  Take 20 mg by mouth daily.     sildenafil 50 MG tablet  Commonly known as:  VIAGRA  Take 25 mg by mouth daily as needed for erectile dysfunction. Take 1 hour prior to sexual activity ( do not exceed 1 dose per 24 hour period)     tamsulosin 0.4 MG Caps capsule  Commonly known as:  FLOMAX  Take 0.4 mg by mouth daily.        Outstanding Labs/Studies  Outpatient stress test Outpatient 30 day event monitor  Duration of Discharge Encounter   Greater than 30 minutes including physician time.  Ramond Dial PA-C Pager: [phone] 03/28/2015, 10:16 AM  Attending Note:   The patient was seen and examined.  Agree with assessment and plan as noted above.  Changes made to the above note as needed.  Pt is  ready for DC See my note from earlier today   Vesta Mixer, Montez Hageman., MD, [facility] 03/28/2015, 5:56 PM [address]  [address] Office [phone] Pager [phone]

## 2015-03-28 NOTE — Progress Notes (Signed)
CARDIAC REHAB PHASE I   PRE:  Rate/Rhythm: 71 SR  BP:  Lying: 92/43  Sitting: 81/48       SaO2: 94 RA  MODE:  Ambulation: 1000 ft   POST:  Rate/Rhythm: 76 SR  BP:  Sitting: 102/48         SaO2: 97 RA  Pt BP low this morning, RN and MD aware, pt asymptomatic. Pt states he feels good this morning but is concerned he has had a "nagging cough since last night" and when he coughs he feels a "pain in the back of his L leg." Pt ambulated 1000 ft on RA, handheld assist, moderate pace, mildly unsteady gait at baseline due to spinal stenosis/hip pain.  Brief standing rest x 2. Pt c/o hip pain and mild DOE, denies CP, dizziness. Pt states he has no questions regarding education but is concerned with having changes to his medication [facility] [place]. Pt to recliner after walk, call bell within reach. Will [facility] at discharge.  [phone]  Joylene GrapesMonge, Coren Crownover C, RN, BSN 03/28/2015 8:51 AM

## 2015-03-28 NOTE — Discharge Instructions (Signed)
Acute Coronary Syndrome  Acute coronary syndrome (ACS) is an urgent problem in which the blood and oxygen supply to the heart is critically deficient. ACS requires hospitalization because one or more coronary arteries may be blocked.  ACS represents a range of conditions including:  · Previous angina that is now unstable, lasts longer, happens at rest, or is more intense.  · A heart attack, with heart muscle cell injury and death.  There are three vital coronary arteries that supply the heart muscle with blood and oxygen so that it can pump blood effectively. If blockages to these arteries develop, blood flow to the heart muscle is reduced. If the heart does not get enough blood, angina may occur as the first warning sign.  SYMPTOMS   · The most common signs of angina include:  ¨ Tightness or squeezing in the chest.  ¨ Feeling of heaviness on the chest.  ¨ Discomfort in the arms, neck, back, or jaw.  ¨ Shortness of breath and nausea.  ¨ Cold, wet skin.  · Angina is usually brought on by physical effort or excitement which increase the oxygen needs of the heart. These states increase the blood flow needs of the heart beyond what can be delivered.  · Other symptoms that are not as common include:  ¨ Fatigue  ¨ Unexplained feelings of nervousness or anxiety  ¨ Weakness  ¨ Diarrhea  · Sometimes, you may not have noticed any symptoms at all but still suffered a cardiac injury.  TREATMENT   · Medicines to help discomfort may include nitroglycerin (nitro) in the form of tablets or a spray for rapid relief, or longer-acting forms such as cream, patches, or capsules. (Be aware that there are many side effects and possible interactions with other drugs).  · Other medicines may be used to help the heart pump better.  · Procedures to open blocked arteries including angioplasty or stent placement to keep the arteries open.  · Open heart surgery may be needed when there are many blockages or they are in critical locations that  are best treated with surgery.  HOME CARE INSTRUCTIONS   · Do not use any tobacco products including cigarettes, chewing tobacco, or electronic cigarettes.  · Take one baby or adult aspirin daily, if your health care provider advises. This helps reduce the risk of a heart attack.  · It is very important that you follow the angina treatment prescribed by your health care provider. Make arrangements for proper follow-up care.  · Eat a heart healthy diet with salt and fat restrictions as advised.  · Regular exercise is good for you as long as it does not cause discomfort. Do not begin any new type of exercise until you check with your health care provider.  · If you are overweight, you should lose weight.  · Try to maintain normal blood lipid levels.  · Keep your blood pressure under control as recommended by your health care provider.  · You should tell your health care provider right away about any increase in the severity or frequency of your chest discomfort or angina attacks. When you have angina, you should stop what you are doing and sit down. This may bring relief in 3 to 5 minutes. If your health care provider has prescribed nitro, take it as directed.  · If your health care provider has given you a follow-up appointment, it is very important to keep that appointment. Not keeping the appointment could result in a chronic or   of breath.  You feel faint, lightheaded, or pass out.  Your chest discomfort gets worse.  You are sweating or experience sudden profound fatigue.  You do not get relief of your chest pain after 3 doses of nitro.  Your discomfort lasts longer than 15 minutes. MAKE SURE YOU:   Understand these instructions.  Will watch your condition.  Will get help right  away if you are not doing well or get worse.  Take all medicines as directed by your health care provider. Document Released: 10/20/2005 Document Revised: 10/25/2013 Document Reviewed: 02/21/2014 [facility] Patient Information 2015 [employer] [place]. This information is not intended to replace advice given to you by your health care provider. Make sure you discuss any questions you have with your health care provider.  No driving for 24 hours. No lifting over 5 lbs for 1 week. No sexual activity for 1 week.Keep procedure site clean & dry. If you notice increased pain, swelling, bleeding or pus, call/return!  You may shower, but no soaking baths/hot tubs/pools for 1 week.   Do not take sublingual nitroglycerine within 24 hours of taking viagara as combination of the 2 can significantly drop blood pressure.

## 2015-03-28 NOTE — Progress Notes (Signed)
PROGRESS NOTE  Subjective:   This very nice 81 year old male is taken care of primarily at the [facility]. He has a long-standing history of hypertension, hyperlipidemia and has a history of spinal stenosis for which she has been treated recently. admmitted with chest tightness NSTEMI . At cath he had an occlusion of the apical LAD thought to be c/w a thrombus.  Also has a moderate - severe LCx stenosis.   Dr. Allyson SabalBerry  Recommended medical therapy  For the apical LAD occlusion and an OP myoview to evaluate the LCx stenosis     Objective:    Vital Signs:   Temp:  [97.7 F (36.5 C)-98.9 F (37.2 C)] 97.7 F (36.5 C) (05/25 0754) Pulse Rate:  [60-81] 70 (05/25 0754) Resp:  [15-20] 18 (05/25 0754) BP: (70-117)/(41-76) 70/41 mmHg (05/25 0754) SpO2:  [93 %-96 %] 95 % (05/25 0754) Weight:  [85.8 kg (189 lb 2.5 oz)] 85.8 kg (189 lb 2.5 oz) (05/25 0044)  Last BM Date: 03/26/15   24-hour weight change: Weight change: -1.1 kg (-2 lb 6.8 oz)  Weight trends: Filed Weights   03/26/15 0410 03/27/15 0034 03/28/15 0044  Weight: 86.7 kg (191 lb 2.2 oz) 86.9 kg (191 lb 9.3 oz) 85.8 kg (189 lb 2.5 oz)    Intake/Output:  05/24 0701 - 05/25 0700 In: 480 [P.O.:480] Out: -  Total I/O In: 120 [P.O.:120] Out: -    Physical Exam: BP 70/41 mmHg  Pulse 70  Temp(Src) 97.7 F (36.5 C) (Oral)  Resp 18  Ht 5\' 2"  (1.575 m)  Wt 85.8 kg (189 lb 2.5 oz)  BMI 34.59 kg/m2  SpO2 95%  Wt Readings from Last 3 Encounters:  03/28/15 85.8 kg (189 lb 2.5 oz)    General: Vital signs reviewed and noted.   Head: Normocephalic, atraumatic.  Eyes: conjunctivae/corneas clear.  EOM's intact.   Throat: normal  Neck:  normal   Lungs:    clear   Heart:  RR   Abdomen:  Soft, non-tender, non-distended    Extremities: Right radial cath site looks good    Neurologic: A&O X3, CN II - XII are grossly intact.   Psych: Normal     Labs: BMET:  Recent Labs  03/25/15 1648  03/27/15 0350  03/28/15 0353  NA 135  < > 135 136  K 4.0  < > 4.2 3.7  CL 99*  < > 105 102  CO2 28  < > 25 26  GLUCOSE 90  < > 92 100*  BUN 24*  < > 17 15  CREATININE 1.47*  < > 1.21 1.36*  CALCIUM 9.3  < > 8.8* 8.7*  MG 2.1  --   --   --   < > = values in this interval not displayed.  Liver function tests:  Recent Labs  03/25/15 1648  AST 40  ALT 35  ALKPHOS 55  BILITOT 0.8  PROT 6.9  ALBUMIN 4.1   No results for input(s): LIPASE, AMYLASE in the last 72 hours.  CBC:  Recent Labs  03/25/15 1648 03/27/15 0350  WBC 8.6 7.8  HGB 15.4 13.6  HCT 44.9 40.2  MCV 83.9 85.2  PLT 237 184    Cardiac Enzymes:  Recent Labs  03/25/15 1648 03/25/15 2112 03/26/15 0246 03/26/15 0930  TROPONINI 0.48* 3.25* 8.70* 9.38*    Coagulation Studies:  Recent Labs  03/25/15 1648  LABPROT 13.9  INR 1.05    Other: Invalid input(s): POCBNP  No results for input(s): DDIMER in the last 72 hours. No results for input(s): HGBA1C in the last 72 hours.  Recent Labs  03/26/15 0246  CHOL 155  HDL 40*  LDLCALC 93  TRIG 409  CHOLHDL 3.9    Recent Labs  03/26/15 0246  TSH 0.217*   No results for input(s): VITAMINB12, FOLATE, FERRITIN, TIBC, IRON, RETICCTPCT in the last 72 hours.   Other results:  Tele - personally reviewed. :  NSR   Medications:    Infusions:    Scheduled Medications: . aspirin  81 mg Oral Daily  . atorvastatin  80 mg Oral q1800  . lisinopril  20 mg Oral QHS   And  . hydrochlorothiazide  12.5 mg Oral QHS  . metoprolol tartrate  12.5 mg Oral BID  . pantoprazole  40 mg Oral Daily  . sodium chloride  3 mL Intravenous Q12H  . tamsulosin  0.4 mg Oral Daily    Assessment/ Plan:   Principal Problem:   NSTEMI 03/25/15 Active Problems:   Chronic kidney disease (CKD), stage III (moderate)   Obesity (BMI 30-39.9)   Spinal stenosis   Hypertension   Hyperlipidemia   Bradycardia   NSVT (nonsustained ventricular tachycardia)   CAD- occluded distal LAD, 75-80%  CFX- medical Rx  . 1. CAD : NSTEMI, due to embolus down the LAD.  Has moderate disease elsewhere Doing fine now Will DC today 3  2. Hypotension:  Will hold HCTZ today and will decrease the Lisinopril to 10 mg a day if he remains hypotensive. He does not think that he is on the HCTZ component    3. Hyperlipidemia  - cont atorvastatin   Ok for DC today     Disposition:  Length of Stay: 3  Vesta Mixer, Montez Hageman., MD, [facility] 03/28/2015, 8:29 AM Office [phone] Pager [phone]

## 2015-03-29 ENCOUNTER — Other Ambulatory Visit: Payer: Self-pay | Admitting: Physician Assistant

## 2015-03-29 ENCOUNTER — Ambulatory Visit (INDEPENDENT_AMBULATORY_CARE_PROVIDER_SITE_OTHER): Payer: Medicare Other

## 2015-03-29 DIAGNOSIS — I4891 Unspecified atrial fibrillation: Secondary | ICD-10-CM

## 2015-03-29 DIAGNOSIS — I472 Ventricular tachycardia: Secondary | ICD-10-CM | POA: Diagnosis not present

## 2015-03-29 DIAGNOSIS — R001 Bradycardia, unspecified: Secondary | ICD-10-CM

## 2015-03-29 DIAGNOSIS — I25119 Atherosclerotic heart disease of native coronary artery with unspecified angina pectoris: Secondary | ICD-10-CM

## 2015-03-29 DIAGNOSIS — I749 Embolism and thrombosis of unspecified artery: Secondary | ICD-10-CM

## 2015-03-29 DIAGNOSIS — I4729 Other ventricular tachycardia: Secondary | ICD-10-CM

## 2015-03-30 ENCOUNTER — Encounter: Payer: Self-pay | Admitting: Cardiology

## 2015-03-30 ENCOUNTER — Telehealth: Payer: Self-pay | Admitting: *Deleted

## 2015-03-30 ENCOUNTER — Ambulatory Visit (INDEPENDENT_AMBULATORY_CARE_PROVIDER_SITE_OTHER): Payer: Medicare Other | Admitting: Cardiology

## 2015-03-30 VITALS — BP 148/74 | HR 67 | Ht 63.0 in | Wt 189.0 lb

## 2015-03-30 DIAGNOSIS — I214 Non-ST elevation (NSTEMI) myocardial infarction: Secondary | ICD-10-CM | POA: Diagnosis not present

## 2015-03-30 DIAGNOSIS — I480 Paroxysmal atrial fibrillation: Secondary | ICD-10-CM | POA: Diagnosis not present

## 2015-03-30 DIAGNOSIS — I4891 Unspecified atrial fibrillation: Secondary | ICD-10-CM | POA: Diagnosis not present

## 2015-03-30 LAB — CBC WITH DIFFERENTIAL/PLATELET
Basophils Absolute: 0.1 10*3/uL (ref 0.0–0.1)
Basophils Relative: 1 % (ref 0–1)
Eosinophils Absolute: 0.2 10*3/uL (ref 0.0–0.7)
Eosinophils Relative: 4 % (ref 0–5)
HEMATOCRIT: 42.9 % (ref 39.0–52.0)
HEMOGLOBIN: 14.6 g/dL (ref 13.0–17.0)
LYMPHS ABS: 0.8 10*3/uL (ref 0.7–4.0)
Lymphocytes Relative: 15 % (ref 12–46)
MCH: 28.6 pg (ref 26.0–34.0)
MCHC: 34 g/dL (ref 30.0–36.0)
MCV: 84.1 fL (ref 78.0–100.0)
MPV: 10.3 fL (ref 8.6–12.4)
Monocytes Absolute: 0.7 10*3/uL (ref 0.1–1.0)
Monocytes Relative: 13 % — ABNORMAL HIGH (ref 3–12)
NEUTROS ABS: 3.4 10*3/uL (ref 1.7–7.7)
NEUTROS PCT: 67 % (ref 43–77)
Platelets: 281 10*3/uL (ref 150–400)
RBC: 5.1 MIL/uL (ref 4.22–5.81)
RDW: 13.5 % (ref 11.5–15.5)
WBC: 5 10*3/uL (ref 4.0–10.5)

## 2015-03-30 MED ORDER — APIXABAN 5 MG PO TABS: 5.0000 mg | ORAL_TABLET | Freq: Two times a day (BID) | ORAL | Status: DC

## 2015-03-30 NOTE — Telephone Encounter (Signed)
Thanks

## 2015-03-30 NOTE — Patient Instructions (Addendum)
Medication Instructions:  START ELIQUIS 5 MG TWICE A DAY  CONTINUE ASPIRIN   STOP IBUPROFEN   Labwork: CBC  Testing/Procedures: NONE  Follow-Up: KEEP APPOINTMENTS AS SCHEDULED   Any Other Special Instructions Will Be Listed Below (If Applicable). Atrial Fibrillation Atrial fibrillation is a type of irregular heart rhythm (arrhythmia). During atrial fibrillation, the upper chambers of the heart (atria) quiver continuously in a chaotic pattern. This causes an irregular and often rapid heart rate.  Atrial fibrillation is the result of the heart becoming overloaded with disorganized signals that tell it to beat. These signals are normally released one at a time by a part of the right atrium called the sinoatrial node. They then travel from the atria to the lower chambers of the heart (ventricles), causing the atria and ventricles to contract and pump blood as they pass. In atrial fibrillation, parts of the atria outside of the sinoatrial node also release these signals. This results in two problems. First, the atria receive so many signals that they do not have time to fully contract. Second, the ventricles, which can only receive one signal at a time, beat irregularly and out of rhythm with the atria.  There are three types of atrial fibrillation:   Paroxysmal. Paroxysmal atrial fibrillation starts suddenly and stops on its own within a week.  Persistent. Persistent atrial fibrillation lasts for more than a week. It may stop on its own or with treatment.  Permanent. Permanent atrial fibrillation does not go away. Episodes of atrial fibrillation may lead to permanent atrial fibrillation. Atrial fibrillation can prevent your heart from pumping blood normally. It increases your risk of stroke and can lead to heart failure.  CAUSES   Heart conditions, including a heart attack, heart failure, coronary artery disease, and heart valve conditions.   Inflammation of the sac that surrounds the  heart (pericarditis).  Blockage of an artery in the lungs (pulmonary embolism).  Pneumonia or other infections.  Chronic lung disease.  Thyroid problems, especially if the thyroid is overactive (hyperthyroidism).  Caffeine, excessive alcohol use, and use of some illegal drugs.   Use of some medicines, including certain decongestants and diet pills.  Heart surgery.   Birth defects.  Sometimes, no cause can be found. When this happens, the atrial fibrillation is called lone atrial fibrillation. The risk of complications from atrial fibrillation increases if you have lone atrial fibrillation and you are age 79 years or older. RISK FACTORS  Heart failure.  Coronary artery disease.  Diabetes mellitus.   High blood pressure (hypertension).   Obesity.   Other arrhythmias.   Increased age. SIGNS AND SYMPTOMS   A feeling that your heart is beating rapidly or irregularly.   A feeling of discomfort or pain in your chest.   Shortness of breath.   Sudden light-headedness or weakness.   Getting tired easily when exercising.   Urinating more often than normal (mainly when atrial fibrillation first begins).  In paroxysmal atrial fibrillation, symptoms may start and suddenly stop. DIAGNOSIS  Your health care provider may be able to detect atrial fibrillation when taking your pulse. Your health care provider may have you take a test called an ambulatory electrocardiogram (ECG). An ECG records your heartbeat patterns over a 24-hour period. You may also have other tests, such as:  Transthoracic echocardiogram (TTE). During echocardiography, sound waves are used to evaluate how blood flows through your heart.  Transesophageal echocardiogram (TEE).  Stress test. There is more than one type of stress test. If  a stress test is needed, ask your health care provider about which type is best for you.  Chest X-ray exam.  Blood tests.  Computed tomography (CT). TREATMENT   Treatment may include:  Treating any underlying conditions. For example, if you have an overactive thyroid, treating the condition may correct atrial fibrillation.  Taking medicine. Medicines may be given to control a rapid heart rate or to prevent blood clots, heart failure, or a stroke.  Having a procedure to correct the rhythm of the heart:  Electrical cardioversion. During electrical cardioversion, a controlled, low-energy shock is delivered to the heart through your skin. If you have chest pain, very low blood pressure, or sudden heart failure, this procedure may need to be done as an emergency.  Catheter ablation. During this procedure, heart tissues that send the signals that cause atrial fibrillation are destroyed.  Surgical ablation. During this surgery, thin lines of heart tissue that carry the abnormal signals are destroyed. This procedure can either be an open-heart surgery or a minimally invasive surgery. With the minimally invasive surgery, small cuts are made to access the heart instead of a large opening.  Pulmonary venous isolation. During this surgery, tissue around the veins that carry blood from the lungs (pulmonary veins) is destroyed. This tissue is thought to carry the abnormal signals. HOME CARE INSTRUCTIONS   Take medicines only as directed by your health care provider. Some medicines can make atrial fibrillation worse or recur.  If blood thinners were prescribed by your health care provider, take them exactly as directed. Too much blood-thinning medicine can cause bleeding. If you take too little, you will not have the needed protection against stroke and other problems.  Perform blood tests at home if directed by your health care provider. Perform blood tests exactly as directed.  Quit smoking if you smoke.  Do not drink alcohol.  Do not drink caffeinated beverages such as coffee, soda, and some teas. You may drink decaffeinated coffee, soda, or tea.    Maintain a healthy weight.Do not use diet pills unless your health care provider approves. They may make heart problems worse.   Follow diet instructions as directed by your health care provider.  Exercise regularly as directed by your health care provider.  Keep all follow-up visits as directed by your health care provider. This is important. PREVENTION  The following substances can cause atrial fibrillation to recur:   Caffeinated beverages.  Alcohol.  Certain medicines, especially those used for breathing problems.  Certain herbs and herbal medicines, such as those containing ephedra or ginseng.  Illegal drugs, such as cocaine and amphetamines. Sometimes medicines are given to prevent atrial fibrillation from recurring. Proper treatment of any underlying condition is also important in helping prevent recurrence.  SEEK MEDICAL CARE IF:  You notice a change in the rate, rhythm, or strength of your heartbeat.  You suddenly begin urinating more frequently.  You tire more easily when exerting yourself or exercising. SEEK IMMEDIATE MEDICAL CARE IF:   You have chest pain, abdominal pain, sweating, or weakness.  You feel nauseous.  You have shortness of breath.  You suddenly have swollen feet and ankles.  You feel dizzy.  Your face or limbs feel numb or weak.  You have a change in your vision or speech. MAKE SURE YOU:   Understand these instructions.  Will watch your condition.  Will get help right away if you are not doing well or get worse. Document Released: 10/20/2005 Document Revised: 03/06/2014 Document Reviewed:  11/30/2012 ExitCare Patient Information 2015 [place], [place]. This information is not intended to replace advice given to you by your health care provider. Make sure you discuss any questions you have with your health care provider.

## 2015-03-30 NOTE — Telephone Encounter (Signed)
Contacted the Randy Durham back to inform him that per Dr Patty SermonsBrackbill, the Randy Durham does have noted new onset afib, with no anticoagulation taken.  Informed the Randy Durham that per Dr Patty SermonsBrackbill our DOD, the Randy Durham should come in to the office today for Dr Patty SermonsBrackbill to see him at 3 pm, to discuss medication management for his new onset afib, and to do a repeat EKG.  Randy Durham verbalized understanding and agrees with this plan.  Randy Durham aware of our location.  Will route this note to the pts Primary Cardiologist Dr Clifton JamesMcAlhany for his review.

## 2015-03-30 NOTE — Telephone Encounter (Signed)
Contacted the pt in regards to his 30 day event monitor showing that the pt was in atrial fibrillation, new onset, with a rate of 50-118 bpm.  Noted the pt is not on any anticoags at this time.  Event happened last night 5/26 around 10:16 pm.  Pt states he felt completely fine last night at that time.  Pt states he was watching tv with his family at that time and was not exerting.  Pt states he has no cardiac complaints at all, and states "I actually feel pretty good since being discharged from the hospital on 5-25."  Pt had a  NSTEMI and had a cath done. On discharge the pt was set up with a 30 day event monitor recommended by Dr Allyson SabalBerry.  Pt had a LAD occlusion which was likely embolic according to his discharge summary from the hospital.  Informed the pt that I will go and speak with our DOD Dr Patty SermonsBrackbill about the pts current event from last night, and return a call back to him with his recommendations.  Pt verbalized understanding and agrees with this plan.

## 2015-03-30 NOTE — Progress Notes (Signed)
Cardiology Office Note   Date:  03/30/2015   ID:  Randy Durham, DOB 1934/08/22, MRN [id number]  PCP:  Randy Lucks, NP  Cardiologist: Randy Clement MD  No chief complaint on file.     History of Present Illness: Randy Durham is a 81 y.o. male who presents for DOD work in office visit.  This 81 year old gentleman was asked to come into the office today to discuss newly diagnosed atrial fibrillation.  He has been wearing an event monitor which showed that he had atrial fibrillation last evening at about 10 PM.  The patient was unaware of his heart rhythm.  The patient was recently hospitalized from 5/22 until 03/28/15 with an acute anteroapical myocardial infarction.  His troponin peaked at 9.38.  His echocardiogram showed an ejection fraction of 35% with anteroapical wall motion abnormalities.  He had a cardiac catheterization which raised the question as to whether his infarct could've been caused by an embolus to the coronary artery.  His cardiac catheterization showed an occluded distal LAD and a 75-80% circumflex.  He did not require stent.  Medical therapy was recommended.  The patient does not have any prior history of TIA or stroke.  He does not have any history of GI bleeding or contraindication to full anticoagulation.     Past Medical History  Diagnosis Date  . Hypertension   . Hyperlipidemia   . Thyroid nodule   . Spinal stenosis   . Anemia   . Obesity (BMI 30-39.9)   . Chronic kidney disease (CKD), stage III (moderate)   . Hypothyroidism   . CAD (coronary artery disease)     Cath 03/26/2015 100% distal LAD stenosis, 80% ostial D1 stenosis, 75% ramus stenosis, 60% RPDA stenosis, 30% proximal RCA stenosis. Medical therapy, outpt myoview to assess LCx lesion    Past Surgical History  Procedure Laterality Date  . Tonsillectomy    . Pilonidal cyst excision    . Cardiac catheterization N/A 03/26/2015    Procedure: Left Heart Cath and Coronary Angiography;   Surgeon: Randy Gess, MD;  Location: [facility] INVASIVE CV LAB;  Service: Cardiovascular;  Laterality: N/A;     Current Outpatient Prescriptions  Medication Sig Dispense Refill  . acetaminophen (TYLENOL) 325 MG tablet Take 325 mg by mouth every 6 (six) hours as needed (pain).    Marland Kitchen aspirin 81 MG chewable tablet Chew 1 tablet (81 mg total) by mouth daily.    Marland Kitchen atorvastatin (LIPITOR) 80 MG tablet Take 1 tablet (80 mg total) by mouth daily at 6 PM. 30 tablet 5  . Bioflavonoid Products (ESTER-C/BIOFLAVONOIDS) TABS Take 0.5 tablets by mouth daily. 1/2 tablet:  250 mg Vitamin C, 27.5 mg Calcium Ascorbate, 26 mg Citrus Bioflavonoid Complex    . ferrous fumarate (FERRO-SEQUELS) 18 MG CR tablet Take 18 mg by mouth daily.    . fluticasone (FLONASE) 50 MCG/ACT nasal spray Place 1 spray into both nostrils daily as needed (seasonal allergies).    Marland Kitchen levothyroxine (SYNTHROID, LEVOTHROID) 125 MCG tablet Take 125 mcg by mouth daily.    Marland Kitchen lisinopril (PRINIVIL,ZESTRIL) 10 MG tablet Take 1 tablet (10 mg total) by mouth at bedtime. 30 tablet 5  . loratadine (CLARITIN) 10 MG tablet Take 10 mg by mouth daily.    . Menthol-Zinc Oxide (GOLD BOND EX) Apply 1 application topically at bedtime.    . metoprolol tartrate (LOPRESSOR) 25 MG tablet Take 0.5 tablets (12.5 mg total) by mouth 2 (two) times daily. 30 tablet 11  .  nitroGLYCERIN (NITROSTAT) 0.4 MG SL tablet Place 1 tablet (0.4 mg total) under the tongue every 5 (five) minutes x 3 doses as needed for chest pain. 25 tablet 3  . omeprazole (PRILOSEC) 20 MG capsule Take 20 mg by mouth daily.    . sildenafil (VIAGRA) 50 MG tablet Take 25 mg by mouth daily as needed for erectile dysfunction. Take 1 hour prior to sexual activity ( do not exceed 1 dose per 24 hour period)    . tamsulosin (FLOMAX) 0.4 MG CAPS capsule Take 0.4 mg by mouth daily.    Marland Kitchen apixaban (ELIQUIS) 5 MG TABS tablet Take 1 tablet (5 mg total) by mouth 2 (two) times daily. 60 tablet 5   No current  facility-administered medications for this visit.    Allergies:   Review of patient's allergies indicates no known allergies.    Social History:  The patient  reports that he has quit smoking. His smoking use included Cigars and Pipe. He has never used smokeless tobacco. He reports that he drinks alcohol. He reports that he does not use illicit drugs.   Family History:  The patient's family history includes Cancer in his sister; Heart attack (age of onset: 63) in his father; Heart disease in his sister; Prostate cancer in his father.    ROS:  Please see the history of present illness.   Otherwise, review of systems are positive for none.   All other systems are reviewed and negative.    PHYSICAL EXAM: VS:  BP 148/74 mmHg  Pulse 67  Ht  (1.6 m)  Wt 189 lb (85.73 kg)  BMI 33.49 kg/m2 , BMI Body mass index is 33.49 kg/(m^2). GEN: Well nourished, well developed, in no acute distress HEENT: normal Neck: no JVD, carotid bruits, or masses Cardiac: RRR; no murmurs, rubs, or gallops,no edema  Respiratory:  clear to auscultation bilaterally, normal work of breathing GI: soft, nontender, nondistended, + BS MS: no deformity or atrophy Skin: warm and dry, no rash Neuro:  Strength and sensation are intact Psych: euthymic mood, full affect   EKG:  EKG is ordered today. The ekg ordered today demonstrates the first EKG shows normal sinus rhythm at 67/m.  A second EKG done 15 minutes later shows atrial fibrillation with controlled ventricular response of 87.  The patient was unaware of the change in heart rhythm   Recent Labs: 03/25/2015: ALT 35; Magnesium 2.1 03/26/2015: TSH 0.217* 03/27/2015: Hemoglobin 13.6; Platelets 184 03/28/2015: BUN 15; Creatinine 1.36*; Potassium 3.7; Sodium 136    Lipid Panel    Component Value Date/Time   CHOL 155 03/26/2015 0246   TRIG 112 03/26/2015 0246   HDL 40* 03/26/2015 0246   CHOLHDL 3.9 03/26/2015 0246   VLDL 22 03/26/2015 0246   LDLCALC 93  03/26/2015 0246      Wt Readings from Last 3 Encounters:  03/30/15 189 lb (85.73 kg)  03/28/15 189 lb 2.5 oz (85.8 kg)      Other studies Reviewed: Additional studies/ records that were reviewed today include: Recent hospital records. Review of the above records demonstrates: Cardiac catheterization and echocardiogram results   ASSESSMENT AND PLAN:  Paroxysmal atrial fibrillation documented by EKG and by recent event monitor.  The patient is chads vasc score of 5 for left ventricular systolic dysfunction and ejection fraction of 35%, age greater than 40, hypertension, and vascular disease with prior MI.   Current medicines are reviewed at length with the patient today.  The patient does not have concerns  regarding medicines.  The following changes have been made:  We are starting him on Apixaban 5 mg twice a day for paroxysmal atrial fibrillation.  Because of his recent myocardial infarction we will also continue his  aspirin 81 mg daily.  He will avoid non-steroidal anti-inflammatory agents.  We are checking a baseline CBC today. He will keep his return appointments with Dr. Allyson SabalBerry and with Norma FredricksonLori Gerhardt as scheduled.  Labs/ tests ordered today include:  Orders Placed This Encounter  Procedures  . CBC with Differential/Platelet  . EKG 12-Lead       Signed, Randy Clementhomas Rexine Gowens MD 03/30/2015 5:41 PM    [facility] [address], [place], [place]  [address] Phone: [phone]; Fax: [phone]

## 2015-04-02 NOTE — Progress Notes (Signed)
Quick Note:  Please report to patient. The recent labs are stable. Continue same medication and careful diet. The hemoglobin is improved ______

## 2015-04-03 ENCOUNTER — Telehealth (HOSPITAL_COMMUNITY): Payer: Self-pay

## 2015-04-03 NOTE — Telephone Encounter (Signed)
Encounter complete. 

## 2015-04-05 ENCOUNTER — Ambulatory Visit (HOSPITAL_COMMUNITY)
Admit: 2015-04-05 | Discharge: 2015-04-05 | Disposition: A | Discharge status: Home / Self Care | Payer: Medicare Other | Source: Ambulatory Visit | Attending: Cardiovascular Disease | Admitting: Cardiovascular Disease

## 2015-04-05 DIAGNOSIS — I25119 Atherosclerotic heart disease of native coronary artery with unspecified angina pectoris: Secondary | ICD-10-CM

## 2015-04-05 LAB — MYOCARDIAL PERFUSION IMAGING
CHL CUP STRESS STAGE 1 SBP: 138 mmHg
CHL CUP STRESS STAGE 2 HR: 56 {beats}/min
CHL CUP STRESS STAGE 2 SPEED: 0 mph
CHL CUP STRESS STAGE 3 SPEED: 0 mph
CHL CUP STRESS STAGE 4 GRADE: 0 %
CHL CUP STRESS STAGE 4 HR: 76 {beats}/min
CSEPEW: 1 METS
CSEPPBP: 131 mmHg
LV dias vol: 77 mL
LV sys vol: 32 mL
NUC STRESS EF: 58 %
Peak HR: 67 {beats}/min
Percent of predicted max HR: 47 %
Rest HR: 58 {beats}/min
SDS: 4
SRS: 5
SSS: 7
Stage 1 DBP: 71 mmHg
Stage 1 Grade: 0 %
Stage 1 HR: 58 {beats}/min
Stage 1 Speed: 0 mph
Stage 2 Grade: 0 %
Stage 3 DBP: 79 mmHg
Stage 3 Grade: 0 %
Stage 3 HR: 67 {beats}/min
Stage 3 SBP: 131 mmHg
Stage 4 DBP: 74 mmHg
Stage 4 SBP: 132 mmHg
Stage 4 Speed: 0 mph
TID: 1.18

## 2015-04-05 MED ORDER — TECHNETIUM TC 99M SESTAMIBI GENERIC - CARDIOLITE
10.9000 | Freq: Once | INTRAVENOUS | Status: AC | PRN
  Administered 2015-04-05: 10.9 via INTRAVENOUS

## 2015-04-05 MED ORDER — TECHNETIUM TC 99M SESTAMIBI GENERIC - CARDIOLITE
30.2000 | Freq: Once | INTRAVENOUS | Status: AC | PRN
  Administered 2015-04-05: 30.2 via INTRAVENOUS

## 2015-04-05 MED ORDER — REGADENOSON 0.4 MG/5ML IV SOLN
0.4000 mg | Freq: Once | INTRAVENOUS | Status: AC
  Administered 2015-04-05: 0.4 mg via INTRAVENOUS

## 2015-04-06 ENCOUNTER — Telehealth: Payer: Self-pay | Admitting: Cardiology

## 2015-04-06 NOTE — Telephone Encounter (Signed)
No answer, will try again later.

## 2015-04-06 NOTE — Telephone Encounter (Signed)
New message     Patient calling wants to know can he be release to start back driving again.   He would like to attend his friends funeral on tomorrow - he has a ride there just in case.

## 2015-04-06 NOTE — Telephone Encounter (Signed)
Advised patient

## 2015-04-06 NOTE — Telephone Encounter (Signed)
He has not had any syncope. The monitor picked up that he is having paroxysmal atrial fibrillation. A coronary artery embolus may have been the cause of his acute MI. He may drive a car now.

## 2015-04-09 ENCOUNTER — Encounter: Payer: Self-pay | Admitting: Nurse Practitioner

## 2015-04-09 ENCOUNTER — Ambulatory Visit (INDEPENDENT_AMBULATORY_CARE_PROVIDER_SITE_OTHER): Payer: Medicare Other | Admitting: Nurse Practitioner

## 2015-04-09 VITALS — BP 120/80 | HR 77 | Ht 63.0 in | Wt 183.6 lb

## 2015-04-09 DIAGNOSIS — I480 Paroxysmal atrial fibrillation: Secondary | ICD-10-CM | POA: Diagnosis not present

## 2015-04-09 DIAGNOSIS — I214 Non-ST elevation (NSTEMI) myocardial infarction: Secondary | ICD-10-CM

## 2015-04-09 DIAGNOSIS — Z9889 Other specified postprocedural states: Secondary | ICD-10-CM

## 2015-04-09 NOTE — Patient Instructions (Addendum)
We will be checking the following labs today - NONE   Medication Instructions:    Continue with your current medicines.     Testing/Procedures To Be Arranged:  N/A  Follow-Up:   See Dr. Clifton JamesMcAlhany in one month with fasting labs - BMET, CBC, TSH, HPF and lipids    Other Special Instructions:   Cardiac rehab will be calling.   Call the [facility] office at [phone] if you have any questions, problems or concerns.

## 2015-04-09 NOTE — Progress Notes (Addendum)
CARDIOLOGY OFFICE NOTE  Date:  04/09/2015    Randy Durham Date of Birth: 10/10/1934 Medical Record #[id number]  PCP:  Boneta LucksBrown, Jennifer, NP  [facility]    Chief Complaint  Patient presents with  . Post cath visit    Seen for Dr. Clifton JamesMcAlhany    History of Present Illness: Randy Durham is a 81 y.o. male who presents today for a post hospital visit. Seen for Dr. Clifton JamesMcAlhany. [facility]. He has a long-standing history of hypertension, and hyperlipidemia.   He had no prior cardiac history until last month - he was eating lunch with his wife on 5/22 when he had midsternal chest discomfort associated with diaphoresis which lasted about 15 minutes. He took Pepto-Bismol, 4 baby aspirin and the chest discomfort improved. EMS was called. Initial EKG showed AV conduction delay type undetermined and he had a Q-wave noted in V2 but no ischemic changes. There was possible subtle ST elevation in the anterior lead. Initial troponin was elevated at 0.48 which gradually trended up to 9.38.   He underwent cardiac catheterization on 03/26/2015 which showed 100% distal LAD stenosis, 80% ostial D1 stenosis, 75% ramus stenosis, 60% RPDA stenosis, 30% proximal RCA stenosis. Left ventricular size is normal, mild left ventricular systolic dysfunction, apical akinesis to dyskinesis. It was felt that distal LAD is probably embolic with apical wall motion and mobility. Although he had a high-grade proximal circumflex stenosis around 75%, however prior to this event he was asymptomatic and this lesion is likely not physiological with significant symptom to contribute to his infarct. Medical therapy was recommended. An outpatient Myoview was also recommended to assess for physiologic significance. His echocardiogram showed an ejection fraction of 35% with anteroapical wall motion abnormalities.   While hospitalized, telemetry showed bradycardia and nonsustained VT overnight however  he was asymptomatic. Dr. Allyson SabalBerry recommended a 30 day event monitor on discharge. HCTZ was discontinued due to low BP.  His lisinopril has been cut back to 10 mg daily.   He was seen back here shortly after discharge by Dr. Patty SermonsBrackbill - his event monitor showed AF. He was unaware. Eliquis was started in addition to his aspirin.   Comes back today. Here with his wife. He has lots of questions. He is quite talkative. Has a cough. Otherwise is doing well. Feels great!. Wants to know about walking, playing golf, having prostate biopsy, what his stress test showed, cardiac rehab, sex, eating, etc. He has no chest pain. No palpitations. Still has his monitor on.   Past Medical History  Diagnosis Date  . Hypertension   . Hyperlipidemia   . Thyroid nodule   . Spinal stenosis   . Anemia   . Obesity (BMI 30-39.9)   . Chronic kidney disease (CKD), stage III (moderate)   . Hypothyroidism   . CAD (coronary artery disease)     Cath 03/26/2015 100% distal LAD stenosis, 80% ostial D1 stenosis, 75% ramus stenosis, 60% RPDA stenosis, 30% proximal RCA stenosis. Medical therapy, outpt myoview to assess LCx lesion    Past Surgical History  Procedure Laterality Date  . Tonsillectomy    . Pilonidal cyst excision    . Cardiac catheterization N/A 03/26/2015    Procedure: Left Heart Cath and Coronary Angiography;  Surgeon: Runell GessJonathan J Berry, MD;  [facility];  Service: Cardiovascular;  Laterality: N/A;     Medications: Current Outpatient Prescriptions  Medication Sig Dispense Refill  . acetaminophen (TYLENOL) 325  MG tablet Take 325 mg by mouth every 6 (six) hours as needed (pain).    Marland Kitchen apixaban (ELIQUIS) 5 MG TABS tablet Take 1 tablet (5 mg total) by mouth 2 (two) times daily. 60 tablet 5  . aspirin 81 MG chewable tablet Chew 1 tablet (81 mg total) by mouth daily.    Marland Kitchen atorvastatin (LIPITOR) 80 MG tablet Take 1 tablet (80 mg total) by mouth daily at 6 PM. 30 tablet 5  . Bioflavonoid Products  (ESTER-C/BIOFLAVONOIDS) TABS Take 0.5 tablets by mouth daily. 1/2 tablet:  250 mg Vitamin C, 27.5 mg Calcium Ascorbate, 26 mg Citrus Bioflavonoid Complex    . ferrous fumarate (FERRO-SEQUELS) 18 MG CR tablet Take 18 mg by mouth daily.    . fluticasone (FLONASE) 50 MCG/ACT nasal spray Place 1 spray into both nostrils daily as needed (seasonal allergies).    Marland Kitchen levothyroxine (SYNTHROID, LEVOTHROID) 125 MCG tablet Take 125 mcg by mouth daily.    Marland Kitchen lisinopril (PRINIVIL,ZESTRIL) 10 MG tablet Take 1 tablet (10 mg total) by mouth at bedtime. 30 tablet 5  . loratadine (CLARITIN) 10 MG tablet Take 10 mg by mouth daily.    . Menthol-Zinc Oxide (GOLD BOND EX) Apply 1 application topically at bedtime.    . metoprolol tartrate (LOPRESSOR) 25 MG tablet Take 0.5 tablets (12.5 mg total) by mouth 2 (two) times daily. 30 tablet 11  . nitroGLYCERIN (NITROSTAT) 0.4 MG SL tablet Place 1 tablet (0.4 mg total) under the tongue every 5 (five) minutes x 3 doses as needed for chest pain. 25 tablet 3  . omeprazole (PRILOSEC) 20 MG capsule Take 20 mg by mouth daily.    . sildenafil (VIAGRA) 50 MG tablet Take 25 mg by mouth daily as needed for erectile dysfunction. Take 1 hour prior to sexual activity ( do not exceed 1 dose per 24 hour period)    . tamsulosin (FLOMAX) 0.4 MG CAPS capsule Take 0.4 mg by mouth daily.     No current facility-administered medications for this visit.    Allergies: No Known Allergies  Social History: The patient  reports that he has quit smoking. His smoking use included Cigars and Pipe. He has never used smokeless tobacco. He reports that he drinks alcohol. He reports that he does not use illicit drugs.   Family History: The patient's family history includes Cancer in his sister; Heart attack (age of onset: 102) in his father; Heart disease in his sister; Prostate cancer in his father.   Review of Systems: Please see the history of present illness.   Otherwise, the review of systems is  positive for none.   All other systems are reviewed and negative.   Physical Exam: VS:  BP 120/80 mmHg  Pulse 77  Ht 5\' 3"  (1.6 m)  Wt 183 lb 9.6 oz (83.28 kg)  BMI 32.53 kg/m2  SpO2 97% .  BMI Body mass index is 32.53 kg/(m^2).  Wt Readings from Last 3 Encounters:  04/09/15 183 lb 9.6 oz (83.28 kg)  03/30/15 189 lb (85.73 kg)  03/28/15 189 lb 2.5 oz (85.8 kg)    General: Pleasant. Very talkative.  Well developed, well nourished and in no acute distress.  HEENT: Normal. Neck: Supple, no JVD, carotid bruits, or masses noted.  Cardiac: Regular rate and rhythm. No murmurs, rubs, or gallops. No edema.  Respiratory:  Lungs are clear to auscultation bilaterally with normal work of breathing.  GI: Soft and nontender.  MS: No deformity or atrophy. Gait and ROM intact.  Skin: Warm and dry. Color is normal.  Neuro:  Strength and sensation are intact and no gross focal deficits noted.  Psych: Alert, appropriate and with normal affect.   LABORATORY DATA:  EKG:  EKG is not ordered today.  Lab Results  Component Value Date   WBC 5.0 03/30/2015   HGB 14.6 03/30/2015   HCT 42.9 03/30/2015   PLT 281 03/30/2015   GLUCOSE 100* 03/28/2015   CHOL 155 03/26/2015   TRIG 112 03/26/2015   HDL 40* 03/26/2015   LDLCALC 93 03/26/2015   ALT 35 03/25/2015   AST 40 03/25/2015   NA 136 03/28/2015   K 3.7 03/28/2015   CL 102 03/28/2015   CREATININE 1.36* 03/28/2015   BUN 15 03/28/2015   CO2 26 03/28/2015   TSH 0.217* 03/26/2015   INR 1.05 03/25/2015    BNP (last 3 results) No results for input(s): BNP in the last 8760 hours.  ProBNP (last 3 results) No results for input(s): PROBNP in the last 8760 hours.   Other Studies Reviewed Today: Coronary Findings    Dominance: Right   Left Anterior Descending   . Dist LAD lesion, 100% stenosed.   . First Diagonal Branch   . Ost 1st Diag to 1st Diag lesion, 80% stenosed. The lesion is type non-C, .     Ramus Intermedius     . Ramus lesion, 75% stenosed. The lesion is type non-C, tubular .     Right Coronary Artery   . Prox RCA lesion, 30% stenosed. The lesion is type non-C, tubular located at bend. The lesion was not previously treated. Pressure wire/FFR was not performed on the lesionIVUS was not performed on the lesion.   . Right Posterior Descending Artery   . RPDA-1 lesion, 60% stenosed. The lesion is type non-C, .   Marland Kitchen RPDA-2 lesion, 60% stenosed. The lesion is type non-C, .     IMPRESSION:Mr. Seipp has occluded distal LAD probably embolic with an apical wall motion and a mallet and a preserved LV function. He does have a moderately high-grade proximal circumflex stenosis in the 75-80% range that fairly focal however prior to this event he was asymptomatic and I suspect that this lesion is not physiologic with significant or at least did not contribute to his infarct. We will continue to treat him medically and obtain an outpatient Myoview to assess physiologic significance. The sheath was removed and a TR band was placed on the right wrist to achieve patient hemostasis. The patient left the cath lab in stable condition.       Echo Study Conclusions from 03/2015  - Left ventricle: Sever hypokinesis of the entire apex, anterior wall, and septum. Some apical ballooning. Question Takot-subo. The cavity size was normal. Wall thickness was increased in a pattern of mild LVH. The estimated ejection fraction was 35%. - Aortic valve: Sclerosis without stenosis. There was no significant regurgitation. - Right ventricle: The cavity size was normal. Systolic function was normal.   Myoview 04/2015 Myocardial perfusion is abnormal. Findings consistent with ischemia and prior myocardial infarction. This is a low risk study. Overall left ventricular systolic function was normal. LV cavity size is normal. The left ventricular ejection fraction is normal (55-65%). There is no prior study  for comparison.  Assessment/Plan: 1. Recent cath with LAD embolus - to manage medically with CV risk factor modification which has been discussed at length today. His Myoview shows scar/ischemia but low risk. He is totally asymptomatic. Will ask Dr. Clifton James to  review.   2. PAF - in sinus by exam. Continue Eliquis - will need surveillance labs on return  3. LV dysfunction - already improved by echo. Will ask Dr. Clifton James to review as well. He is totally asymptomatic.   4. Chronic anticoagulation - no problems noted.   The following changes have been made:  See above.  Labs/ tests ordered today include:   No orders of the defined types were placed in this encounter.    Current medicines are reviewed at length with the patient today. The patient does not have concerns regarding medicines.   Disposition:   FU with Dr. Clifton James in one month with complete lab.    Patient is agreeable to this plan and will call if any problems develop in the interim.   Signed: Rosalio Macadamia, RN, ANP-C 04/09/2015 10:57 AM  [facility] [address] [place], [place]  [address] Phone: [phone] Fax: [phone]      Addendum from Dr. Clifton James:  Lawson Fiscal, Thanks for seeing him. It sounds like he is doing ok so would continue current plan. Thanks, Thayer Ohm

## 2015-04-11 ENCOUNTER — Telehealth: Payer: Self-pay | Admitting: *Deleted

## 2015-04-11 NOTE — Telephone Encounter (Signed)
Left message on machine for pt to contact the office.   

## 2015-04-11 NOTE — Telephone Encounter (Signed)
S/w pt is aware Dr. Clifton JamesMcalhany has reviewed test as well.

## 2015-04-11 NOTE — Telephone Encounter (Signed)
Follow Up ° ° ° ° ° °Pt returning Randy Durham's phone call. °

## 2015-04-19 ENCOUNTER — Encounter (HOSPITAL_COMMUNITY)
Admission: RE | Admit: 2015-04-19 | Discharge: 2015-04-19 | Disposition: A | Discharge status: Home / Self Care | Payer: Medicare Other | Source: Ambulatory Visit | Attending: Cardiovascular Disease | Admitting: Cardiovascular Disease

## 2015-04-19 DIAGNOSIS — I252 Old myocardial infarction: Secondary | ICD-10-CM | POA: Insufficient documentation

## 2015-04-19 NOTE — Progress Notes (Signed)
Cardiac Rehab Medication Review by a Pharmacist  Does the patient  feel that his/her medications are working for him/her?  yes  Has the patient been experiencing any side effects to the medications prescribed?  Yes  Dry mouth  Does the patient measure his/her own blood pressure or blood glucose at home?  yes   Does the patient have any problems obtaining medications due to transportation or finances?   no  Understanding of regimen: excellent Understanding of indications: excellent Potential of compliance: excellent  Pharmacist comments: 32 YOM presenting to cardiac rehab.  Patient appears to be compliant with his medication regimen.  He is well aware of what he takes.  Reports no side effects other than dry mouth, however, this does not appear to be due to medications.  Counseled on hydrating himself to see if symptoms resolve.  He monitors his BP at home and appears stable.  No barriers to obtaining medications.  Red Christians, Pharm. D. Clinical Pharmacy Resident Pager: [phone] Ph: [phone] 04/19/2015 8:38 AM

## 2015-04-23 ENCOUNTER — Encounter (HOSPITAL_COMMUNITY)
Admission: RE | Admit: 2015-04-23 | Discharge: 2015-04-23 | Disposition: A | Discharge status: Home / Self Care | Payer: Medicare Other | Source: Ambulatory Visit | Attending: Cardiovascular Disease | Admitting: Cardiovascular Disease

## 2015-04-23 ENCOUNTER — Encounter (HOSPITAL_COMMUNITY): Payer: Self-pay

## 2015-04-23 DIAGNOSIS — I252 Old myocardial infarction: Secondary | ICD-10-CM | POA: Diagnosis not present

## 2015-04-23 NOTE — Progress Notes (Signed)
Pt started cardiac rehab today.  Pt tolerated light exercise without difficulty. VSS, telemetry-sinus rhythm, first degree AV block, negative QRS, T wave inversion, unchanged from stress 12 lead 04/05/2015,  Pt c/o fleeting mild  indigestion while walking treadmill resolved spontaneously.   Medication list reconciled.  Pt verbalized compliance with medications and denies barriers to compliance. PSYCHOSOCIAL ASSESSMENT:  PHQ-0. Pt exhibits positive coping skills, hopeful outlook with supportive family. No psychosocial needs identified at this time, no psychosocial interventions necessary.    Pt enjoys travel, golf, fishing and [place] history.   Pt cardiac rehab  goal is  to increase strength, improve nutrition and lose weight.  Pt encouraged to participate in cardiac rehab exercise and education classes, specifically nutrition and exercising on your own to increase ability to achieve these goals.  Pt oriented to exercise equipment and routine.  Understanding verbalized.

## 2015-04-25 ENCOUNTER — Encounter (HOSPITAL_COMMUNITY)
Admission: RE | Admit: 2015-04-25 | Discharge: 2015-04-25 | Disposition: A | Discharge status: Home / Self Care | Payer: Medicare Other | Source: Ambulatory Visit | Attending: Cardiovascular Disease | Admitting: Cardiovascular Disease

## 2015-04-25 DIAGNOSIS — I252 Old myocardial infarction: Secondary | ICD-10-CM | POA: Diagnosis not present

## 2015-04-27 ENCOUNTER — Encounter (HOSPITAL_COMMUNITY)
Admission: RE | Admit: 2015-04-27 | Discharge: 2015-04-27 | Disposition: A | Discharge status: Home / Self Care | Payer: Medicare Other | Source: Ambulatory Visit | Attending: Cardiovascular Disease | Admitting: Cardiovascular Disease

## 2015-04-27 ENCOUNTER — Telehealth: Payer: Self-pay | Admitting: *Deleted

## 2015-04-27 DIAGNOSIS — I252 Old myocardial infarction: Secondary | ICD-10-CM | POA: Diagnosis not present

## 2015-04-27 NOTE — Telephone Encounter (Signed)
Patient called and states "since I do not have a-fib, do I have to continue taking the eliquis?" Please advise. Thanks, MI

## 2015-04-27 NOTE — Telephone Encounter (Signed)
Spoke with pt and told him he needs to continue Eliquis.  He states he does not need a new prescription

## 2015-04-30 ENCOUNTER — Encounter (HOSPITAL_COMMUNITY)
Admission: RE | Admit: 2015-04-30 | Discharge: 2015-04-30 | Disposition: A | Discharge status: Home / Self Care | Payer: Medicare Other | Source: Ambulatory Visit | Attending: Cardiovascular Disease | Admitting: Cardiovascular Disease

## 2015-04-30 DIAGNOSIS — I252 Old myocardial infarction: Secondary | ICD-10-CM | POA: Diagnosis not present

## 2015-05-02 ENCOUNTER — Encounter (HOSPITAL_COMMUNITY)
Admission: RE | Admit: 2015-05-02 | Discharge: 2015-05-02 | Disposition: A | Discharge status: Home / Self Care | Payer: Medicare Other | Source: Ambulatory Visit | Attending: Cardiovascular Disease | Admitting: Cardiovascular Disease

## 2015-05-02 DIAGNOSIS — I252 Old myocardial infarction: Secondary | ICD-10-CM | POA: Diagnosis not present

## 2015-05-02 NOTE — Progress Notes (Signed)
QUALITY OF LIFE SCORE REVIEW No quality of life needs identified at this time.  All scores within satisfactory scores.  No psychosocial needs identified, no psychosocial intervetions necessary.    Offered emotional support and reassurance.  Will continue to monitor and intervene as necessary.

## 2015-05-03 ENCOUNTER — Telehealth: Payer: Self-pay | Admitting: *Deleted

## 2015-05-03 NOTE — Telephone Encounter (Signed)
Spoke with pt and reviewed monitor results with him. He is aware to continue all medications.  He would like cheaper alternative to Eliquis and will discuss with Dr. Clifton JamesMcAlhany at office visit on May 10, 2015

## 2015-05-03 NOTE — Telephone Encounter (Signed)
Randy Durham, Randy Durham    RE: Event monitor  Received: Today   Kathleene Hazelhristopher D McAlhany, MD  Dossie ArbourPatricia L Karrissa Parchment, RN      See monitor. Several runs of atrial fib. Continue Eliquis and Metoprolol. cdm   I placed call to pt and left message to call back

## 2015-05-04 ENCOUNTER — Encounter (HOSPITAL_COMMUNITY)
Admission: RE | Admit: 2015-05-04 | Discharge: 2015-05-04 | Disposition: A | Discharge status: Home / Self Care | Payer: Medicare Other | Source: Ambulatory Visit | Attending: Cardiovascular Disease | Admitting: Cardiovascular Disease

## 2015-05-04 DIAGNOSIS — I252 Old myocardial infarction: Secondary | ICD-10-CM | POA: Diagnosis not present

## 2015-05-04 NOTE — Progress Notes (Signed)
Randy Durham 81 y.o. male Nutrition Note Spoke with pt.  Nutrition Survey reviewed with pt. Pt is following Step 2 of the Therapeutic Lifestyle Changes diet. Pt wants to lose wt. Pt has been trying to lose wt by "changing my diet and doing what you (the rehab staff) are telling me to do." Pt wt 82 kg today. Wt is down 1.1 kg over the past 2 weeks. Wt loss tips reviewed. Pt expressed understanding of the information reviewed. Pt aware of nutrition education classes offered. No results found for: HGBA1C Wt Readings from Last 3 Encounters:  04/19/15 183 lb 3.2 oz (83.1 kg)  04/09/15 183 lb 9.6 oz (83.28 kg)  04/05/15 191 lb (86.637 kg)   Nutrition Diagnosis ? Food-and nutrition-related knowledge deficit related to lack of exposure to information as related to diagnosis of: ? CVD ? Obesity related to excessive energy intake as evidenced by a BMI of 33  Nutrition Intervention ? Benefits of adopting Therapeutic Lifestyle Changes discussed when Medficts reviewed. ? Pt to attend the Portion Distortion class ? Pt to attend the  ? Nutrition I class - met; 05/01/15                    ? Nutrition II class ? Pt given handouts for: ? Nutrition II class ? Continue client-centered nutrition education by RD, as part of interdisciplinary care.  Goal(s) ? Pt to identify food quantities necessary to achieve: ? wt loss to a goal wt of 158-176 lb (72.2-80.4 kg) at graduation from cardiac rehab.  ? Pt to describe the benefit of including fruits, vegetables, whole grains, and low-fat dairy products in a heart healthy meal plan.  Monitor and Evaluate progress toward nutrition goal with team.  Derek Mound, M.Ed, RD, LDN, CDE 05/04/2015 9:11 AM

## 2015-05-09 ENCOUNTER — Telehealth: Payer: Self-pay | Admitting: Cardiovascular Disease

## 2015-05-09 ENCOUNTER — Encounter (HOSPITAL_COMMUNITY)
Admission: RE | Admit: 2015-05-09 | Discharge: 2015-05-09 | Disposition: A | Discharge status: Home / Self Care | Payer: Medicare Other | Source: Ambulatory Visit | Attending: Cardiovascular Disease | Admitting: Cardiovascular Disease

## 2015-05-09 DIAGNOSIS — I252 Old myocardial infarction: Secondary | ICD-10-CM | POA: Diagnosis not present

## 2015-05-09 NOTE — Telephone Encounter (Signed)
New problem    Pt need orders for his labs for tomorrow's visit.

## 2015-05-09 NOTE — Progress Notes (Signed)
Reviewed home exercise with pt today.  Pt plans to walk on treadmill at neighborhood fitness center and outdoors for exercise, 3-4 days a week in addition to CRPII.  Reviewed THR, pulse, RPE, sign and symptoms, NTG use, and when to call 911 or MD.  Pt voiced understanding.    Warrick ParisianAmber Neria Procter, MS,ACSM RCEP

## 2015-05-09 NOTE — Telephone Encounter (Signed)
Spoke with scheduling. They confirmed appt time with pt and he is aware to fast.  Will order labs during office visit with Dr. Clifton JamesMcAlhany tomorrow.

## 2015-05-10 ENCOUNTER — Other Ambulatory Visit: Payer: Medicare Other

## 2015-05-10 ENCOUNTER — Ambulatory Visit (INDEPENDENT_AMBULATORY_CARE_PROVIDER_SITE_OTHER): Payer: Medicare Other | Admitting: Cardiovascular Disease

## 2015-05-10 ENCOUNTER — Encounter: Payer: Self-pay | Admitting: Cardiovascular Disease

## 2015-05-10 VITALS — BP 100/60 | HR 56 | Ht 62.5 in | Wt 177.8 lb

## 2015-05-10 DIAGNOSIS — I480 Paroxysmal atrial fibrillation: Secondary | ICD-10-CM

## 2015-05-10 DIAGNOSIS — I2510 Atherosclerotic heart disease of native coronary artery without angina pectoris: Secondary | ICD-10-CM | POA: Diagnosis not present

## 2015-05-10 DIAGNOSIS — I255 Ischemic cardiomyopathy: Secondary | ICD-10-CM

## 2015-05-10 LAB — BASIC METABOLIC PANEL
BUN: 21 mg/dL (ref 6–23)
CO2: 29 meq/L (ref 19–32)
CREATININE: 1.27 mg/dL (ref 0.40–1.50)
Calcium: 9.9 mg/dL (ref 8.4–10.5)
Chloride: 99 mEq/L (ref 96–112)
GFR: 58.14 mL/min — ABNORMAL LOW (ref >60.00)
Glucose, Bld: 101 mg/dL — ABNORMAL HIGH (ref 70–99)
Potassium: 4.3 mEq/L (ref 3.5–5.1)
Sodium: 136 mEq/L (ref 135–145)

## 2015-05-10 LAB — LIPID PANEL
CHOLESTEROL: 131 mg/dL (ref 0–200)
HDL: 45 mg/dL (ref >39.00)
LDL Cholesterol: 70 mg/dL (ref 0–99)
NONHDL: 86
Total CHOL/HDL Ratio: 3
Triglycerides: 80 mg/dL (ref 0.0–149.0)
VLDL: 16 mg/dL (ref 0.0–40.0)

## 2015-05-10 LAB — CBC WITH DIFFERENTIAL/PLATELET
BASOS ABS: 0 10*3/uL (ref 0.0–0.1)
Basophils Relative: 0.7 % (ref 0.0–3.0)
Eosinophils Absolute: 0.1 10*3/uL (ref 0.0–0.7)
Eosinophils Relative: 1.8 % (ref 0.0–5.0)
HCT: 46.3 % (ref 39.0–52.0)
HEMOGLOBIN: 15.4 g/dL (ref 13.0–17.0)
LYMPHS PCT: 22.3 % (ref 12.0–46.0)
Lymphs Abs: 1.3 10*3/uL (ref 0.7–4.0)
MCHC: 33.2 g/dL (ref 30.0–36.0)
MCV: 84.6 fl (ref 78.0–100.0)
Monocytes Absolute: 0.5 10*3/uL (ref 0.1–1.0)
Monocytes Relative: 7.7 % (ref 3.0–12.0)
Neutro Abs: 4.1 10*3/uL (ref 1.4–7.7)
Neutrophils Relative %: 67.5 % (ref 43.0–77.0)
PLATELETS: 232 10*3/uL (ref 150.0–400.0)
RBC: 5.48 Mil/uL (ref 4.22–5.81)
RDW: 14.4 % (ref 11.5–15.5)
WBC: 6.1 10*3/uL (ref 4.0–10.5)

## 2015-05-10 LAB — HEPATIC FUNCTION PANEL
ALBUMIN: 4.1 g/dL (ref 3.5–5.2)
ALT: 32 U/L (ref 0–53)
AST: 36 U/L (ref 0–37)
Alkaline Phosphatase: 55 U/L (ref 39–117)
Bilirubin, Direct: 0.3 mg/dL (ref 0.0–0.3)
Total Bilirubin: 1 mg/dL (ref 0.2–1.2)
Total Protein: 7.1 g/dL (ref 6.0–8.3)

## 2015-05-10 LAB — TSH: TSH: 0.18 u[IU]/mL — ABNORMAL LOW (ref 0.35–4.50)

## 2015-05-10 NOTE — Progress Notes (Signed)
Chief Complaint  Patient presents with  . Follow-up     History of Present Illness: 81 y.o. male with history of HTN, HLD, CAD, atrial fibrillation who is here today for cardiac follow up. He was [facility]/25/15 with chest pain/NSTEMI. He underwent cardiac catheterization on 03/26/2015 which showed 100% distal LAD stenosis, 80% ostial D1 stenosis, 75% ramus stenosis, 60% RPDA stenosis, 30% proximal RCA stenosis. Left ventricular size was normal, mild left ventricular systolic dysfunction, apical akinesis to dyskinesis. It was felt that distal LAD was probably embolic with apical wall motion and mobility. Although he had a high-grade proximal circumflex stenosis around 75%, however prior to this event he was asymptomatic and this lesion was felt to not likely be flow limiting. Medical therapy was recommended by Dr. Allyson Durham who performed his cath. An outpatient Myoview was also recommended to assess for physiologic significance. His echocardiogram showed an ejection fraction of 35% with anteroapical wall motion abnormalities.While hospitalized, telemetry showed bradycardia and nonsustained VT however he was asymptomatic. Dr. Allyson Durham recommended a 30 day event monitor on discharge. HCTZ was discontinued due to low BP. His lisinopril was cut back to 10 mg daily. He was seen back here shortly after discharge by Dr. Patty Durham. His event monitor showed sinus rhythm with episodes of atrial fibrillation. He was not symptomatic with this. Eliquis was started in addition to his aspirin. Stress myoview June 2016 with small apical defect with minimal ischemia.   He is here today for follow up. He has been feeling well. No chest pain or SOB. No palpitations. No LE edema.   Primary Care Physician: Randy Durham  Last Lipid Profile:Lipid Panel     Component Value Date/Time   CHOL 155 03/26/2015 0246   TRIG 112 03/26/2015 0246   HDL 40* 03/26/2015 0246   CHOLHDL 3.9 03/26/2015 0246   VLDL 22  03/26/2015 0246   LDLCALC 93 03/26/2015 0246    Past Medical History  Diagnosis Date  . Hypertension   . Hyperlipidemia   . Thyroid nodule   . Spinal stenosis   . Anemia   . Obesity (BMI 30-39.9)   . Chronic kidney disease (CKD), stage III (moderate)   . Hypothyroidism   . CAD (coronary artery disease)     Cath 03/26/2015 100% distal LAD stenosis, 80% ostial D1 stenosis, 75% ramus stenosis, 60% RPDA stenosis, 30% proximal RCA stenosis. Medical therapy, outpt myoview to assess LCx lesion    Past Surgical History  Procedure Laterality Date  . Tonsillectomy    . Pilonidal cyst excision    . Cardiac catheterization N/A 03/26/2015    Procedure: Left Heart Cath and Coronary Angiography;  Surgeon: Randy GessJonathan J Berry, MD;  [facility];  Service: Cardiovascular;  Laterality: N/A;    Current Outpatient Prescriptions  Medication Sig Dispense Refill  . acetaminophen (TYLENOL) 325 MG tablet Take 325 mg by mouth every 6 (six) hours as needed (pain).    Marland Kitchen. apixaban (ELIQUIS) 5 MG TABS tablet Take 1 tablet (5 mg total) by mouth 2 (two) times daily. 60 tablet 5  . aspirin 81 MG chewable tablet Chew 1 tablet (81 mg total) by mouth daily.    Marland Kitchen. atorvastatin (LIPITOR) 80 MG tablet Take 1 tablet (80 mg total) by mouth daily at 6 PM. 30 tablet 5  . Bioflavonoid Products (ESTER-C/BIOFLAVONOIDS) TABS Take 0.5 tablets by mouth daily. 1/2 tablet:  250 mg Vitamin C, 27.5 mg Calcium Ascorbate, 26 mg Citrus Bioflavonoid Complex    .  ferrous fumarate (FERRO-SEQUELS) 18 MG CR tablet Take 4 mg by mouth daily.     . fluticasone (FLONASE) 50 MCG/ACT nasal spray Place 1 spray into both nostrils daily as needed (seasonal allergies).    Marland Kitchen levothyroxine (SYNTHROID, LEVOTHROID) 125 MCG tablet Take 125 mcg by mouth daily before breakfast.     . lisinopril (PRINIVIL,ZESTRIL) 10 MG tablet Take 1 tablet (10 mg total) by mouth at bedtime. 30 tablet 5  . loratadine (CLARITIN) 10 MG tablet Take 10 mg by mouth  daily.    . Menthol-Zinc Oxide (GOLD BOND EX) Apply 1 application topically at bedtime.    . metoprolol tartrate (LOPRESSOR) 25 MG tablet Take 0.5 tablets (12.5 mg total) by mouth 2 (two) times daily. 30 tablet 11  . nitroGLYCERIN (NITROSTAT) 0.4 MG SL tablet Place 1 tablet (0.4 mg total) under the tongue every 5 (five) minutes x 3 doses as needed for chest pain. 25 tablet 3  . omeprazole (PRILOSEC) 20 MG capsule Take 20 mg by mouth daily.    . sildenafil (VIAGRA) 50 MG tablet Take 25 mg by mouth daily as needed for erectile dysfunction. Take 1 hour prior to sexual activity ( do not exceed 1 dose per 24 hour period)    . tamsulosin (FLOMAX) 0.4 MG CAPS capsule Take 0.4 mg by mouth daily.     No current facility-administered medications for this visit.    No Known Allergies  History   Social History  . Marital Status: Married    Spouse Name: N/A  . Number of Children: N/A  . Years of Education: N/A   Occupational History  . Not on file.   Social History Main Topics  . Smoking status: Former Smoker    Types: Cigars, Pipe  . Smokeless tobacco: Never Used  . Alcohol Use: 0.0 oz/week    0 Standard drinks or equivalent per week     Comment: rarely  . Drug Use: No  . Sexual Activity: Not on file   Other Topics Concern  . Not on file   Social History Narrative   Retired Psychologist, educational for [employer]    Family History  Problem Relation Age of Onset  . Heart attack Father 12    cause of death  . Prostate cancer Father   . Cancer Sister   . Heart disease Sister     Review of Systems:  As stated in the HPI and otherwise negative.   BP 100/60 mmHg  Pulse 56  Ht 5' 2.5" (1.588 m)  Wt 177 lb 12.8 oz (80.65 kg)  BMI 31.98 kg/m2  SpO2 98%  Physical Examination: General: Well developed, well nourished, NAD HEENT: OP clear, mucus membranes moist SKIN: warm, dry. No rashes. Neuro: No focal deficits Musculoskeletal: Muscle strength 5/5 all ext Psychiatric: Mood and affect  normal Neck: No JVD, no carotid bruits, no thyromegaly, no lymphadenopathy. Lungs:Clear bilaterally, no wheezes, rhonci, crackles Cardiovascular: Regular rate and rhythm. No murmurs, gallops or rubs. Abdomen:Soft. Bowel sounds present. Non-tender.  Extremities: No lower extremity edema. Pulses are 2 + in the bilateral DP/PT.  Stress myoview 04/05/15: Abnormal lexiscan nuclear stress test demonstrating a small region of scar/periinfarst ischemia involing the apex, apical lateral and apical inferior segment. EF is preserved at 58%. Mild apical hypocontractility.  Echo 03/26/15: Left ventricle: Sever hypokinesis of the entire apex, anterior wall, and septum. Some apical ballooning. Question Takot-subo. The cavity size was normal. Wall thickness was increased in a pattern of mild LVH. The estimated ejection fraction  was 35%. - Aortic valve: Sclerosis without stenosis. There was no significant regurgitation. - Right ventricle: The cavity size was normal. Systolic function was normal.  EKG:  EKG is not ordered today. The ekg ordered today demonstrates   Recent Labs: 03/25/2015: ALT 35; Magnesium 2.1 03/26/2015: TSH 0.217* 03/28/2015: BUN 15; Creatinine, Ser 1.36*; Potassium 3.7; Sodium 136 03/30/2015: Hemoglobin 14.6; Platelets 281   Lipid Panel    Component Value Date/Time   CHOL 155 03/26/2015 0246   TRIG 112 03/26/2015 0246   HDL 40* 03/26/2015 0246   CHOLHDL 3.9 03/26/2015 0246   VLDL 22 03/26/2015 0246   LDLCALC 93 03/26/2015 0246     Wt Readings from Last 3 Encounters:  05/10/15 177 lb 12.8 oz (80.65 kg)  04/19/15 183 lb 3.2 oz (83.1 kg)  04/09/15 183 lb 9.6 oz (83.28 kg)     Other studies Reviewed: Additional studies/ records that were reviewed today include: . Review of the above records demonstrates:    Assessment and Plan:   1. CAD: Recent admission to [facility] May 2016 with NSTEMI. LAD occluded distally but no PCI performed. Moderately severe stenosis in the  ramus intermediate. Stress myoview June 2016 with apical scar/ischemia but low risk. He is totally asymptomatic. Will continue medical therapy.   2. Paroxymal atrial fibrillation: Appears to be in sinus today. Will continue Eliquis. He is concerned about cost. He may choose to switch to coumadin.   3. Ischemic cardiomyopathy: LV function improved on recent myoview. Continue medical management.    Current medicines are reviewed at length with the patient today.  The patient does not have concerns regarding medicines.  The following changes have been made:  no change  Labs/ tests ordered today include:   Orders Placed This Encounter  Procedures  . Basic Metabolic Panel (BMET)  . Lipid Profile  . Hepatic function panel  . CBC w/Diff  . TSH    Disposition:   FU with me in 6 months  Signed, Verne Carrow, MD 05/10/2015 11:44 AM    [facility] [address] [place], [place], [place]  [address] Phone: [phone]; Fax: [phone]

## 2015-05-10 NOTE — Patient Instructions (Signed)
Medication Instructions:  Your physician recommends that you continue on your current medications as directed. Please refer to the Current Medication list given to you today.   Labwork: Lab work to be done today--BMP, CBC, TSH, Lipid and Liver profiles  Testing/Procedures: None  Follow-Up: Your physician wants you to follow-up in: 6 months.  You will receive a reminder letter in the mail two months in advance. If you don't receive a letter, please call our office to schedule the follow-up appointment.

## 2015-05-11 ENCOUNTER — Telehealth (HOSPITAL_COMMUNITY): Payer: Self-pay | Admitting: Cardiac Rehabilitation

## 2015-05-11 ENCOUNTER — Encounter (HOSPITAL_COMMUNITY)
Admission: RE | Admit: 2015-05-11 | Discharge: 2015-05-11 | Disposition: A | Discharge status: Home / Self Care | Payer: Medicare Other | Source: Ambulatory Visit | Attending: Cardiovascular Disease | Admitting: Cardiovascular Disease

## 2015-05-11 DIAGNOSIS — I252 Old myocardial infarction: Secondary | ICD-10-CM | POA: Diagnosis not present

## 2015-05-11 NOTE — Progress Notes (Signed)
Pt hypotensive today at cardiac rehab. Pre exercise: 96/62, Peak BP: 108/64, Post exercise: 87/52. Pt given gatorade, recheck BP: 85/52. Pt given peanut butter crackers and water. Recheck: 103/55 sitting, 93/61 standing. Pt asymptomatic. Pt reports he did heavy housework yesterday and did not eat or drink as much as usual.  Dr. Clifton JamesMcAlhany made aware.

## 2015-05-11 NOTE — Telephone Encounter (Signed)
Pt made aware to increase PO fluids.  Understanding verbalized

## 2015-05-11 NOTE — Telephone Encounter (Signed)
-----   Message from Kathleene Hazelhristopher D McAlhany, MD sent at 05/11/2015 11:24 AM EDT ----- Regarding: RE: cardiac rehab No. I would have him push fluids this weekend. Thayer Ohmhris  ----- Message -----    From: Robyne PeersJoann H Anah Billard, RN    Sent: 05/11/2015  10:09 AM      To: Kathleene Hazelhristopher D McAlhany, MD Subject: cardiac rehab                                  Dear Dr. Clifton JamesMcAlhany,  Pt hypotensive today at cardiac rehab.  Pre exercise:  96/62, Peak BP:  108/64, Post exercise:  87/52.  Pt given gatorade, recheck BP:  85/52.  Pt given peanut butter crackers and water.  Recheck:  103/55 sitting, 93/61 standing.  Pt asymptomatic.  Pt reports he did heavy housework yesterday and did not eat or drink as much as usual.    Are there any new recommendations?    Thank you, Deveron FurlongJoann Harim Bi, RN, BSN Cardiac Pulmonary Rehab

## 2015-05-14 ENCOUNTER — Encounter (HOSPITAL_COMMUNITY)
Admission: RE | Admit: 2015-05-14 | Discharge: 2015-05-14 | Disposition: A | Discharge status: Home / Self Care | Payer: Medicare Other | Source: Ambulatory Visit | Attending: Cardiovascular Disease | Admitting: Cardiovascular Disease

## 2015-05-14 DIAGNOSIS — I252 Old myocardial infarction: Secondary | ICD-10-CM | POA: Diagnosis not present

## 2015-05-16 ENCOUNTER — Telehealth: Payer: Self-pay | Admitting: Nurse Practitioner

## 2015-05-16 ENCOUNTER — Encounter (HOSPITAL_COMMUNITY)
Admission: RE | Admit: 2015-05-16 | Discharge: 2015-05-16 | Disposition: A | Discharge status: Home / Self Care | Payer: Medicare Other | Source: Ambulatory Visit | Attending: Cardiovascular Disease | Admitting: Cardiovascular Disease

## 2015-05-16 DIAGNOSIS — I252 Old myocardial infarction: Secondary | ICD-10-CM | POA: Diagnosis not present

## 2015-05-16 NOTE — Telephone Encounter (Signed)
Spoke with pt he was unsure what was being sent.  Will follow up with Randy Durham to verify what was being faxed and will refax per pt request.

## 2015-05-16 NOTE — Telephone Encounter (Signed)
New message      Talk to Lori---[facility] she was going to [facility] to them.  [place] [place] [phone] [facility]

## 2015-05-16 NOTE — Progress Notes (Signed)
30 day psychosocial assessment   No psychosocial needs identified, no intervention necessary. Pt is exercising on his own at home.  Pt brought home blood pressure cuff to rehab for calibration.  Home cuff measured same BP as manual cuff by staff. Pt reassured.

## 2015-05-17 NOTE — Telephone Encounter (Signed)
Faxed copy of Lawson FiscalLori and McAlhany's last OV to [employer] [employer]. As pt requested.

## 2015-05-17 NOTE — Telephone Encounter (Signed)
Not really sure what he is wanting but Would send Dr. Gibson RampMcAlhany's last note as well as mine.

## 2015-05-18 ENCOUNTER — Encounter (HOSPITAL_COMMUNITY)
Admission: RE | Admit: 2015-05-18 | Discharge: 2015-05-18 | Disposition: A | Discharge status: Home / Self Care | Payer: Medicare Other | Source: Ambulatory Visit | Attending: Cardiovascular Disease | Admitting: Cardiovascular Disease

## 2015-05-18 DIAGNOSIS — I252 Old myocardial infarction: Secondary | ICD-10-CM | POA: Diagnosis not present

## 2015-05-21 ENCOUNTER — Encounter (HOSPITAL_COMMUNITY)
Admission: RE | Admit: 2015-05-21 | Discharge: 2015-05-21 | Disposition: A | Discharge status: Home / Self Care | Payer: Medicare Other | Source: Ambulatory Visit | Attending: Cardiovascular Disease | Admitting: Cardiovascular Disease

## 2015-05-21 DIAGNOSIS — I252 Old myocardial infarction: Secondary | ICD-10-CM | POA: Diagnosis not present

## 2015-05-21 NOTE — Progress Notes (Signed)
Pt noted to be back in afib.  Pt reports history of in and out.  Pt is on eliquis. Bp slightly lower today but responds well with H20.  Pt asymptomatic.  Continue to monitor. Alanson Alyarlette Lylie Blacklock RN, BSN

## 2015-05-23 ENCOUNTER — Encounter (HOSPITAL_COMMUNITY)
Admission: RE | Admit: 2015-05-23 | Discharge: 2015-05-23 | Disposition: A | Discharge status: Home / Self Care | Payer: Medicare Other | Source: Ambulatory Visit | Attending: Cardiovascular Disease | Admitting: Cardiovascular Disease

## 2015-05-23 DIAGNOSIS — I252 Old myocardial infarction: Secondary | ICD-10-CM | POA: Diagnosis not present

## 2015-05-25 ENCOUNTER — Encounter (HOSPITAL_COMMUNITY)
Admission: RE | Admit: 2015-05-25 | Discharge: 2015-05-25 | Disposition: A | Discharge status: Home / Self Care | Payer: Medicare Other | Source: Ambulatory Visit | Attending: Cardiovascular Disease | Admitting: Cardiovascular Disease

## 2015-05-25 DIAGNOSIS — I252 Old myocardial infarction: Secondary | ICD-10-CM | POA: Diagnosis not present

## 2015-05-28 ENCOUNTER — Encounter (HOSPITAL_COMMUNITY)
Admission: RE | Admit: 2015-05-28 | Discharge: 2015-05-28 | Disposition: A | Discharge status: Home / Self Care | Payer: Medicare Other | Source: Ambulatory Visit | Attending: Cardiovascular Disease | Admitting: Cardiovascular Disease

## 2015-05-28 DIAGNOSIS — I252 Old myocardial infarction: Secondary | ICD-10-CM | POA: Diagnosis not present

## 2015-05-30 ENCOUNTER — Encounter (HOSPITAL_COMMUNITY): Payer: Medicare Other

## 2015-06-01 ENCOUNTER — Encounter (HOSPITAL_COMMUNITY)
Admission: RE | Admit: 2015-06-01 | Discharge: 2015-06-01 | Disposition: A | Discharge status: Home / Self Care | Payer: Medicare Other | Source: Ambulatory Visit | Attending: Cardiovascular Disease | Admitting: Cardiovascular Disease

## 2015-06-01 DIAGNOSIS — I252 Old myocardial infarction: Secondary | ICD-10-CM | POA: Diagnosis not present

## 2015-06-04 ENCOUNTER — Encounter (HOSPITAL_COMMUNITY)
Admission: RE | Admit: 2015-06-04 | Discharge: 2015-06-04 | Disposition: A | Discharge status: Home / Self Care | Payer: Medicare Other | Source: Ambulatory Visit | Attending: Cardiovascular Disease | Admitting: Cardiovascular Disease

## 2015-06-04 DIAGNOSIS — I252 Old myocardial infarction: Secondary | ICD-10-CM | POA: Insufficient documentation

## 2015-06-06 ENCOUNTER — Encounter (HOSPITAL_COMMUNITY)
Admission: RE | Admit: 2015-06-06 | Discharge: 2015-06-06 | Disposition: A | Discharge status: Home / Self Care | Payer: Medicare Other | Source: Ambulatory Visit | Attending: Cardiovascular Disease | Admitting: Cardiovascular Disease

## 2015-06-06 DIAGNOSIS — I252 Old myocardial infarction: Secondary | ICD-10-CM | POA: Diagnosis not present

## 2015-06-08 ENCOUNTER — Encounter (HOSPITAL_COMMUNITY)
Admission: RE | Admit: 2015-06-08 | Discharge: 2015-06-08 | Disposition: A | Discharge status: Home / Self Care | Payer: Medicare Other | Source: Ambulatory Visit | Attending: Cardiovascular Disease | Admitting: Cardiovascular Disease

## 2015-06-08 DIAGNOSIS — I252 Old myocardial infarction: Secondary | ICD-10-CM | POA: Diagnosis not present

## 2015-06-11 ENCOUNTER — Encounter (HOSPITAL_COMMUNITY)
Admission: RE | Admit: 2015-06-11 | Discharge: 2015-06-11 | Disposition: A | Discharge status: Home / Self Care | Payer: Medicare Other | Source: Ambulatory Visit | Attending: Cardiovascular Disease | Admitting: Cardiovascular Disease

## 2015-06-11 DIAGNOSIS — I252 Old myocardial infarction: Secondary | ICD-10-CM | POA: Diagnosis not present

## 2015-06-13 ENCOUNTER — Encounter (HOSPITAL_COMMUNITY)
Admission: RE | Admit: 2015-06-13 | Discharge: 2015-06-13 | Disposition: A | Discharge status: Home / Self Care | Payer: Medicare Other | Source: Ambulatory Visit | Attending: Cardiovascular Disease | Admitting: Cardiovascular Disease

## 2015-06-13 DIAGNOSIS — I252 Old myocardial infarction: Secondary | ICD-10-CM | POA: Diagnosis not present

## 2015-06-15 ENCOUNTER — Encounter (HOSPITAL_COMMUNITY)
Admission: RE | Admit: 2015-06-15 | Discharge: 2015-06-15 | Disposition: A | Discharge status: Home / Self Care | Payer: Medicare Other | Source: Ambulatory Visit | Attending: Cardiovascular Disease | Admitting: Cardiovascular Disease

## 2015-06-15 DIAGNOSIS — I252 Old myocardial infarction: Secondary | ICD-10-CM | POA: Diagnosis not present

## 2015-06-18 ENCOUNTER — Encounter (HOSPITAL_COMMUNITY)
Admission: RE | Admit: 2015-06-18 | Discharge: 2015-06-18 | Disposition: A | Discharge status: Home / Self Care | Payer: Medicare Other | Source: Ambulatory Visit | Attending: Cardiovascular Disease | Admitting: Cardiovascular Disease

## 2015-06-18 DIAGNOSIS — I252 Old myocardial infarction: Secondary | ICD-10-CM | POA: Diagnosis not present

## 2015-06-20 ENCOUNTER — Encounter (HOSPITAL_COMMUNITY)
Admission: RE | Admit: 2015-06-20 | Discharge: 2015-06-20 | Disposition: A | Discharge status: Home / Self Care | Payer: Medicare Other | Source: Ambulatory Visit | Attending: Cardiovascular Disease | Admitting: Cardiovascular Disease

## 2015-06-20 DIAGNOSIS — I252 Old myocardial infarction: Secondary | ICD-10-CM | POA: Diagnosis not present

## 2015-06-22 ENCOUNTER — Encounter (HOSPITAL_COMMUNITY)
Admission: RE | Admit: 2015-06-22 | Discharge: 2015-06-22 | Disposition: A | Discharge status: Home / Self Care | Payer: Medicare Other | Source: Ambulatory Visit | Attending: Cardiovascular Disease | Admitting: Cardiovascular Disease

## 2015-06-22 DIAGNOSIS — I252 Old myocardial infarction: Secondary | ICD-10-CM | POA: Diagnosis not present

## 2015-06-22 NOTE — Progress Notes (Signed)
60 day psychosocial assessment  No psychosocial needs identified, no intervention necessary. Pt is exercising on his own at home.  Pt reports he has increased endurance which is measured by improved ability to walk longer distances and climb stairs. Pt is planning to play 9 holes of golf tomorrow. Pt is aware of temperature/humidity precautions and advised to increase PO fluid intake with outdoor activity. Understanding verbalized

## 2015-06-25 ENCOUNTER — Encounter (HOSPITAL_COMMUNITY)
Admission: RE | Admit: 2015-06-25 | Discharge: 2015-06-25 | Disposition: A | Discharge status: Home / Self Care | Payer: Medicare Other | Source: Ambulatory Visit | Attending: Cardiovascular Disease | Admitting: Cardiovascular Disease

## 2015-06-25 DIAGNOSIS — I252 Old myocardial infarction: Secondary | ICD-10-CM | POA: Diagnosis not present

## 2015-06-27 ENCOUNTER — Ambulatory Visit (HOSPITAL_COMMUNITY): Payer: Medicare Other

## 2015-06-27 ENCOUNTER — Encounter (HOSPITAL_COMMUNITY)
Admission: RE | Admit: 2015-06-27 | Discharge: 2015-06-27 | Disposition: A | Discharge status: Home / Self Care | Payer: Medicare Other | Source: Ambulatory Visit | Attending: Cardiovascular Disease | Admitting: Cardiovascular Disease

## 2015-06-27 ENCOUNTER — Encounter (HOSPITAL_COMMUNITY): Payer: Medicare Other

## 2015-06-27 DIAGNOSIS — I252 Old myocardial infarction: Secondary | ICD-10-CM | POA: Diagnosis not present

## 2015-06-29 ENCOUNTER — Telehealth: Payer: Self-pay | Admitting: Cardiovascular Disease

## 2015-06-29 ENCOUNTER — Encounter (HOSPITAL_COMMUNITY)
Admission: RE | Admit: 2015-06-29 | Discharge: 2015-06-29 | Disposition: A | Discharge status: Home / Self Care | Payer: Medicare Other | Source: Ambulatory Visit | Attending: Cardiovascular Disease | Admitting: Cardiovascular Disease

## 2015-06-29 NOTE — Progress Notes (Signed)
Pt c/o chest tightness on exertion today at cardiac rehab while exercising at high workload on Nustep.  Rates 2/10.  Tightness immediately relieved with rest.  No return of pain at rest. Otherwise asymptomatic.  Exercise stopped.   Pt also reports episode of chest tightness that occurred Wednesday which was relieved with NTG SL x1. PC to Dr. Gibson Ramp office. Per Dennie Bible who reviewed with Dr. Myrtis Ser, pt worked in to see Dr. Clifton James Monday 07/02/15 :00pm.  pt instructed on proper NTG use, and when to present to ED. Pt instructed to withhold exercise including cardiac rehab and heavy exertion until seen in office.  Understanding verbalized

## 2015-06-29 NOTE — Telephone Encounter (Signed)
New message    Per Randa Evens: Pt was at cardiac rehab this morning and had chest pain that went away with rest Pt also had chest pain on Wednesday 8-24 and had to take a nitro.  Pt c/o of Chest Pain: STAT if CP now or developed within 24 hours  1. Are you having CP right now? No  2. Are you experiencing any other symptoms (ex. SOB, nausea, vomiting, sweating)? No  3. How long have you been experiencing CP? Wednesday  4. Is your CP continuous or coming and going? Comes with exertion or stress  5. Have you taken Nitroglycerin? Took nitro on Wednesday did not have to take this morning   Please call to discuss ?

## 2015-06-29 NOTE — Telephone Encounter (Signed)
Reviewed with Dr. Myrtis Ser (DOD) and pt should hold exercise for now and see Dr. Clifton James on Monday. He should rest over the weekend and skip cardiac rehab on Monday.   I spoke with Aurea Graff at Cardiac Rehab and gave her this information.  Appt made for pt to see Dr. Clifton James on 07/02/15 at 2:00.  Aurea Graff will give all instructions to pt including appt time and make him aware to go to hospital if symptoms worsen prior to upcoming appt.

## 2015-06-29 NOTE — Telephone Encounter (Signed)
Spoke with Aurea Graff at Cardiac rehab. She reports pt had chest pain at rehab this AM while exercising.  Resolved immediately with rest. Vital signs OK.  Had episode on Wednesday at home that was relieved with one NTG.  This occurred after pt learned of wife's abnormal mammogram. Pt currently still at rehab.  Will review with provider in office.

## 2015-06-29 NOTE — Telephone Encounter (Signed)
Thanks

## 2015-07-02 ENCOUNTER — Encounter (HOSPITAL_COMMUNITY): Payer: Medicare Other

## 2015-07-02 ENCOUNTER — Telehealth (HOSPITAL_COMMUNITY): Payer: Self-pay | Admitting: Cardiac Rehabilitation

## 2015-07-02 ENCOUNTER — Ambulatory Visit (INDEPENDENT_AMBULATORY_CARE_PROVIDER_SITE_OTHER): Payer: Medicare Other | Admitting: Cardiovascular Disease

## 2015-07-02 ENCOUNTER — Encounter: Payer: Self-pay | Admitting: Cardiovascular Disease

## 2015-07-02 VITALS — BP 122/70 | HR 53 | Ht 62.5 in | Wt 177.8 lb

## 2015-07-02 DIAGNOSIS — I255 Ischemic cardiomyopathy: Secondary | ICD-10-CM | POA: Diagnosis not present

## 2015-07-02 DIAGNOSIS — I480 Paroxysmal atrial fibrillation: Secondary | ICD-10-CM | POA: Diagnosis not present

## 2015-07-02 DIAGNOSIS — E039 Hypothyroidism, unspecified: Secondary | ICD-10-CM | POA: Diagnosis not present

## 2015-07-02 DIAGNOSIS — R972 Elevated prostate specific antigen [PSA]: Secondary | ICD-10-CM | POA: Diagnosis not present

## 2015-07-02 DIAGNOSIS — I2510 Atherosclerotic heart disease of native coronary artery without angina pectoris: Secondary | ICD-10-CM | POA: Diagnosis not present

## 2015-07-02 DIAGNOSIS — Z87898 Personal history of other specified conditions: Secondary | ICD-10-CM | POA: Diagnosis not present

## 2015-07-02 NOTE — Patient Instructions (Addendum)
Medication Instructions:  Your physician recommends that you continue on your current medications as directed. Please refer to the Current Medication list given to you today.   Labwork: none  Testing/Procedures: none  Follow-Up: Your physician recommends that you schedule a follow-up appointment in: 3-4 months.  Scheduled for October 15, 2015 at 8:00   Any Other Special Instructions Will Be Listed Below (If Applicable).

## 2015-07-02 NOTE — Progress Notes (Signed)
Chief Complaint  Patient presents with  . Chest Pain     History of Present Illness: 81 y.o. male with history of HTN, HLD, CAD, atrial fibrillation who is here today for cardiac follow up. He was admitted to [facility] 03/25/15 with chest pain/NSTEMI. He underwent cardiac catheterization on 03/26/2015 which showed 100% distal LAD stenosis, 80% ostial D1 stenosis, 75% ramus stenosis, 60% RPDA stenosis, 30% proximal RCA stenosis. Left ventricular size was normal, mild left ventricular systolic dysfunction, apical akinesis to dyskinesis. It was felt that distal LAD was probably embolic with apical wall motion and mobility. Although he had a high-grade proximal circumflex stenosis around 75%, however prior to this event he was asymptomatic and this lesion was felt to not likely be flow limiting. Medical therapy was recommended by Dr. Allyson Sabal who performed his cath. An outpatient Myoview was also recommended to assess for physiologic significance. His echocardiogram showed an ejection fraction of 35% with anteroapical wall motion abnormalities.While hospitalized, telemetry showed bradycardia and nonsustained VT however he was asymptomatic. Dr. Allyson Sabal recommended a 30 day event monitor on discharge. HCTZ was discontinued due to low BP. His lisinopril was cut back to 10 mg daily. He was seen back here shortly after discharge by Dr. Patty Sermons. His event monitor showed sinus rhythm with episodes of atrial fibrillation. He was not symptomatic with this. Eliquis was started in addition to his aspirin. Stress myoview June 2016 with small apical defect with minimal ischemia.   He is here today for follow up. He was in cardiac rehab last week and had c/o chest pain. This was during exercise on the bike. He did not take a NTG and the pain resolved quickly. He has no SOB, palpitations. No LE edema.   Primary Care Physician: Boneta Lucks  Last Lipid Profile:Lipid Panel     Component Value Date/Time   CHOL 131  05/10/2015 1039   TRIG 80.0 05/10/2015 1039   HDL 45.00 05/10/2015 1039   CHOLHDL 3 05/10/2015 1039   VLDL 16.0 05/10/2015 1039   LDLCALC 70 05/10/2015 1039    Past Medical History  Diagnosis Date  . Hypertension   . Hyperlipidemia   . Thyroid nodule   . Spinal stenosis   . Anemia   . Obesity (BMI 30-39.9)   . Chronic kidney disease (CKD), stage III (moderate)   . Hypothyroidism   . CAD (coronary artery disease)     Cath 03/26/2015 100% distal LAD stenosis, 80% ostial D1 stenosis, 75% ramus stenosis, 60% RPDA stenosis, 30% proximal RCA stenosis. Medical therapy, outpt myoview to assess LCx lesion    Past Surgical History  Procedure Laterality Date  . Tonsillectomy    . Pilonidal cyst excision    . Cardiac catheterization N/A 03/26/2015    Procedure: Left Heart Cath and Coronary Angiography;  Surgeon: Runell Gess, MD;  Location: [facility] INVASIVE CV LAB;  Service: Cardiovascular;  Laterality: N/A;    Current Outpatient Prescriptions  Medication Sig Dispense Refill  . acetaminophen (TYLENOL) 325 MG tablet Take 325 mg by mouth every 6 (six) hours as needed (pain).    Marland Kitchen apixaban (ELIQUIS) 5 MG TABS tablet Take 1 tablet (5 mg total) by mouth 2 (two) times daily. 60 tablet 5  . aspirin 81 MG chewable tablet Chew 1 tablet (81 mg total) by mouth daily.    Marland Kitchen atorvastatin (LIPITOR) 80 MG tablet Take 1 tablet (80 mg total) by mouth daily at 6 PM. 30 tablet 5  . Bioflavonoid Products (ESTER-C/BIOFLAVONOIDS)  TABS Take 0.5 tablets by mouth daily. 1/2 tablet:  250 mg Vitamin C, 27.5 mg Calcium Ascorbate, 26 mg Citrus Bioflavonoid Complex    . ferrous fumarate (FERRO-SEQUELS) 18 MG CR tablet Take 4 mg by mouth daily.     . fluticasone (FLONASE) 50 MCG/ACT nasal spray Place 1 spray into both nostrils daily as needed (seasonal allergies).    Marland Kitchen levothyroxine (SYNTHROID, LEVOTHROID) 112 MCG tablet Take 112 mcg by mouth daily before breakfast.    . lisinopril (PRINIVIL,ZESTRIL) 10 MG tablet Take 1  tablet (10 mg total) by mouth at bedtime. 30 tablet 5  . loratadine (CLARITIN) 10 MG tablet Take 10 mg by mouth daily.    . Menthol-Zinc Oxide (GOLD BOND EX) Apply 1 application topically at bedtime.    . metoprolol tartrate (LOPRESSOR) 25 MG tablet Take 0.5 tablets (12.5 mg total) by mouth 2 (two) times daily. 30 tablet 11  . nitroGLYCERIN (NITROSTAT) 0.4 MG SL tablet Place 1 tablet (0.4 mg total) under the tongue every 5 (five) minutes x 3 doses as needed for chest pain. 25 tablet 3  . omeprazole (PRILOSEC) 20 MG capsule Take 20 mg by mouth daily.    . sildenafil (VIAGRA) 50 MG tablet Take 25 mg by mouth daily as needed for erectile dysfunction. Take 1 hour prior to sexual activity ( do not exceed 1 dose per 24 hour period)    . tamsulosin (FLOMAX) 0.4 MG CAPS capsule Take 0.4 mg by mouth daily.     No current facility-administered medications for this visit.    No Known Allergies  Social History   Social History  . Marital Status: Married    Spouse Name: N/A  . Number of Children: N/A  . Years of Education: N/A   Occupational History  . Not on file.   Social History Main Topics  . Smoking status: Former Smoker    Types: Cigars, Pipe  . Smokeless tobacco: Never Used  . Alcohol Use: 0.0 oz/week    0 Standard drinks or equivalent per week     Comment: rarely  . Drug Use: No  . Sexual Activity: Not on file   Other Topics Concern  . Not on file   Social History Narrative   Retired Psychologist, educational for [employer]    Family History  Problem Relation Age of Onset  . Heart attack Father 77    cause of death  . Prostate cancer Father   . Cancer Sister   . Heart disease Sister     Review of Systems:  As stated in the HPI and otherwise negative.   BP 122/70 mmHg  Pulse 53  Ht 5' 2.5" (1.588 m)  Wt 177 lb 12.8 oz (80.65 kg)  BMI 31.98 kg/m2  SpO2 96%  Physical Examination: General: Well developed, well nourished, NAD HEENT: OP clear, mucus membranes moist SKIN:  warm, dry. No rashes. Neuro: No focal deficits Musculoskeletal: Muscle strength 5/5 all ext Psychiatric: Mood and affect normal Neck: No JVD, no carotid bruits, no thyromegaly, no lymphadenopathy. Lungs:Clear bilaterally, no wheezes, rhonci, crackles Cardiovascular: Regular rate and rhythm. No murmurs, gallops or rubs. Abdomen:Soft. Bowel sounds present. Non-tender.  Extremities: No lower extremity edema. Pulses are 2 + in the bilateral DP/PT.  Stress myoview 04/05/15: Abnormal lexiscan nuclear stress test demonstrating a small region of scar/periinfarst ischemia involing the apex, apical lateral and apical inferior segment. EF is preserved at 58%. Mild apical hypocontractility.  Echo 03/26/15: Left ventricle: Sever hypokinesis of the entire apex, anterior  wall, and septum. Some apical ballooning. Question Takot-subo. The cavity size was normal. Wall thickness was increased in a pattern of mild LVH. The estimated ejection fraction was 35%. - Aortic valve: Sclerosis without stenosis. There was no significant regurgitation. - Right ventricle: The cavity size was normal. Systolic function was normal.  EKG:  EKG is ordered today. The ekg ordered today demonstrates Sinus brady, rate 53 bpm. LVH, T wave abnormalities.   Recent Labs: 03/25/2015: Magnesium 2.1 05/10/2015: ALT 32; BUN 21; Creatinine, Ser 1.27; Hemoglobin 15.4; Platelets 232.0; Potassium 4.3; Sodium 136; TSH 0.18*   Lipid Panel    Component Value Date/Time   CHOL 131 05/10/2015 1039   TRIG 80.0 05/10/2015 1039   HDL 45.00 05/10/2015 1039   CHOLHDL 3 05/10/2015 1039   VLDL 16.0 05/10/2015 1039   LDLCALC 70 05/10/2015 1039     Wt Readings from Last 3 Encounters:  07/02/15 177 lb 12.8 oz (80.65 kg)  05/10/15 177 lb 12.8 oz (80.65 kg)  04/19/15 183 lb 3.2 oz (83.1 kg)     Other studies Reviewed: Additional studies/ records that were reviewed today include: . Review of the above records demonstrates:     Assessment and Plan:   1. CAD/Unstable angina: Recent admission to [facility] May 2016 with NSTEMI. LAD occluded distally but no PCI performed. Moderately severe stenosis in the ramus intermediate. Stress myoview June 2016 with apical scar/ischemia but low risk. He had one episode of chest pain but none since. This lasted for several minutes and quickly resolved. Given moderate CAD, I would suspect some angina. If it changes in intensity or frequency will need repeat cath.   2. Paroxymal atrial fibrillation: Appears to be in sinus today. Will continue Eliquis. Continue metoprolol for rate control.   3. Ischemic cardiomyopathy: LV function improved on recent myoview. Continue medical management.    Current medicines are reviewed at length with the patient today.  The patient does not have concerns regarding medicines.  The following changes have been made:  no change  Labs/ tests ordered today include:   Orders Placed This Encounter  Procedures  . EKG 12-Lead    Disposition:   FU with me in 3-4 months  Signed, Verne Carrow, MD 07/02/2015 4:27 PM    [facility] [address] [place], [place], [place]  [address] Phone: [phone]; Fax: [phone]

## 2015-07-02 NOTE — Telephone Encounter (Signed)
-----   Message from Kathleene Hazel, MD sent at 07/02/2015  5:11 PM EDT ----- Regarding: RE: cardiac rehab I told him to return to rehab and see how he feels with exercise. Thanks, Randy Durham  ----- Message -----    From: Robyne Peers, RN    Sent: 07/02/2015   4:57 PM      To: Kathleene Hazel, MD Subject: cardiac rehab                                  Dear Dr. Clifton James,   Pt was seen today for chest pain evaluation.  Is it ok for him to return to cardiac rehab?  Thank you, Deveron Furlong, RN, BSN Cardiac Pulmonary Rehab

## 2015-07-04 ENCOUNTER — Encounter (HOSPITAL_COMMUNITY)
Admission: RE | Admit: 2015-07-04 | Discharge: 2015-07-04 | Disposition: A | Discharge status: Home / Self Care | Payer: Medicare Other | Source: Ambulatory Visit | Attending: Cardiovascular Disease | Admitting: Cardiovascular Disease

## 2015-07-04 DIAGNOSIS — I252 Old myocardial infarction: Secondary | ICD-10-CM | POA: Diagnosis not present

## 2015-07-06 ENCOUNTER — Encounter (HOSPITAL_COMMUNITY)
Admission: RE | Admit: 2015-07-06 | Discharge: 2015-07-06 | Disposition: A | Discharge status: Home / Self Care | Payer: Medicare Other | Source: Ambulatory Visit | Attending: Cardiovascular Disease | Admitting: Cardiovascular Disease

## 2015-07-06 DIAGNOSIS — I252 Old myocardial infarction: Secondary | ICD-10-CM | POA: Insufficient documentation

## 2015-07-06 NOTE — Progress Notes (Signed)
Pt arrived at cardiac rehab reporting synthroid dose change to per his PCP at [facility].  Medication list reconciled.

## 2015-07-11 ENCOUNTER — Encounter (HOSPITAL_COMMUNITY)
Admission: RE | Admit: 2015-07-11 | Discharge: 2015-07-11 | Disposition: A | Discharge status: Home / Self Care | Payer: Medicare Other | Source: Ambulatory Visit | Attending: Cardiovascular Disease | Admitting: Cardiovascular Disease

## 2015-07-11 DIAGNOSIS — I252 Old myocardial infarction: Secondary | ICD-10-CM | POA: Diagnosis not present

## 2015-07-13 ENCOUNTER — Encounter (HOSPITAL_COMMUNITY)
Admission: RE | Admit: 2015-07-13 | Discharge: 2015-07-13 | Disposition: A | Discharge status: Home / Self Care | Payer: Medicare Other | Source: Ambulatory Visit | Attending: Cardiovascular Disease | Admitting: Cardiovascular Disease

## 2015-07-13 ENCOUNTER — Ambulatory Visit: Payer: Medicare Other | Admitting: Cardiovascular Disease

## 2015-07-13 DIAGNOSIS — I252 Old myocardial infarction: Secondary | ICD-10-CM | POA: Diagnosis not present

## 2015-07-16 ENCOUNTER — Encounter (HOSPITAL_COMMUNITY)
Admission: RE | Admit: 2015-07-16 | Discharge: 2015-07-16 | Disposition: A | Discharge status: Home / Self Care | Payer: Medicare Other | Source: Ambulatory Visit | Attending: Cardiovascular Disease | Admitting: Cardiovascular Disease

## 2015-07-16 DIAGNOSIS — I252 Old myocardial infarction: Secondary | ICD-10-CM | POA: Diagnosis not present

## 2015-07-18 ENCOUNTER — Encounter (HOSPITAL_COMMUNITY)
Admission: RE | Admit: 2015-07-18 | Discharge: 2015-07-18 | Disposition: A | Discharge status: Home / Self Care | Payer: Medicare Other | Source: Ambulatory Visit | Attending: Cardiovascular Disease | Admitting: Cardiovascular Disease

## 2015-07-18 DIAGNOSIS — I252 Old myocardial infarction: Secondary | ICD-10-CM | POA: Diagnosis not present

## 2015-07-20 ENCOUNTER — Encounter (HOSPITAL_COMMUNITY)
Admission: RE | Admit: 2015-07-20 | Discharge: 2015-07-20 | Disposition: A | Discharge status: Home / Self Care | Payer: Medicare Other | Source: Ambulatory Visit | Attending: Cardiovascular Disease | Admitting: Cardiovascular Disease

## 2015-07-20 DIAGNOSIS — I252 Old myocardial infarction: Secondary | ICD-10-CM | POA: Diagnosis not present

## 2015-07-23 ENCOUNTER — Encounter (HOSPITAL_COMMUNITY)
Admission: RE | Admit: 2015-07-23 | Discharge: 2015-07-23 | Disposition: A | Discharge status: Home / Self Care | Payer: Medicare Other | Source: Ambulatory Visit | Attending: Cardiovascular Disease | Admitting: Cardiovascular Disease

## 2015-07-23 ENCOUNTER — Encounter: Payer: Self-pay | Admitting: Podiatry

## 2015-07-23 ENCOUNTER — Ambulatory Visit (INDEPENDENT_AMBULATORY_CARE_PROVIDER_SITE_OTHER): Payer: Medicare Other | Admitting: Podiatry

## 2015-07-23 VITALS — BP 112/62 | HR 65 | Resp 18

## 2015-07-23 DIAGNOSIS — B351 Tinea unguium: Secondary | ICD-10-CM

## 2015-07-23 DIAGNOSIS — Q828 Other specified congenital malformations of skin: Secondary | ICD-10-CM | POA: Diagnosis not present

## 2015-07-23 DIAGNOSIS — I252 Old myocardial infarction: Secondary | ICD-10-CM | POA: Diagnosis not present

## 2015-07-23 DIAGNOSIS — I255 Ischemic cardiomyopathy: Secondary | ICD-10-CM | POA: Diagnosis not present

## 2015-07-23 DIAGNOSIS — M79676 Pain in unspecified toe(s): Secondary | ICD-10-CM | POA: Diagnosis not present

## 2015-07-23 NOTE — Progress Notes (Signed)
   Subjective:    Patient ID: Randy Durham, male    DOB: 17-Nov-1933, 81 y.o.   MRN: [id number]  HPI  81 year old male presents to the office today for concerns of painful nails which he cannot trim himself. He denies any redness or drainage along the nail sites. He states he has previously seen other podiatrists for the same issues. He was previously using over-the-counter topical medicine for the nail fungus but he lives bilateral hallux nails which did not help much. He also states his a painful callus on the ball of his left foot. Denies any redness or drainage. No other complaints at this time.    Review of Systems  All other systems reviewed and are negative.      Objective:   Physical Exam AAO x3, NAD DP/PT pulses palpable bilaterally, CRT less than 3 seconds Protective sensation intact with Simms Weinstein monofilament, vibratory sensation intact, Achilles tendon reflex intact Nails are hypertrophic, dystrophic, brittle, discolored, elongated particularly the hallux nails. There is incurvation of lesser digit nails. There is tenderness to palpation overlying nails 1-5 bilaterally. No swelling redness or drainage. Small annular hyperkeratotic lesion left foot second metatarsal 5. Upon debridement no underlying ulceration, drainage or other signs of infection. No other areas of tenderness to bilateral lower extremities. MMT 5/5, ROM WNL.  No open lesions or pre-ulcerative lesions.  No overlying edema, erythema, increase in warmth to bilateral lower extremities.  No pain with calf compression, swelling, warmth, erythema bilaterally.      Assessment & Plan:  81 year old male with symptomatic onychomycosis, ingrown toenail; porokeratosis -Treatment options discussed including all alternatives, risks, and complications -Nail sharply debrided 10 without complications/bleeding -Hyperkeratotic lesion sharply debrided 1 without complications/bleeding. -Discussed importance daily foot  inspection. -Follow-up in 3 monthsor sooner if any problems arise. In the meantime, encouraged to call the office with any questions, concerns, change in symptoms.   Ovid Curd, DPM

## 2015-07-25 ENCOUNTER — Encounter (HOSPITAL_COMMUNITY)
Admission: RE | Admit: 2015-07-25 | Discharge: 2015-07-25 | Disposition: A | Discharge status: Home / Self Care | Payer: Medicare Other | Source: Ambulatory Visit | Attending: Cardiovascular Disease | Admitting: Cardiovascular Disease

## 2015-07-25 ENCOUNTER — Encounter (HOSPITAL_COMMUNITY): Payer: Self-pay

## 2015-07-25 DIAGNOSIS — I252 Old myocardial infarction: Secondary | ICD-10-CM | POA: Diagnosis not present

## 2015-07-25 NOTE — Progress Notes (Signed)
Pt graduated from cardiac rehab program today with completion of 36 exercise sessions in Phase II. Pt maintained good attendance and progressed nicely during his participation in rehab as evidenced by increased MET level.   Medication list reconciled. Repeat  PHQ score- 0 .  Pt has made significant lifestyle changes and should be commended for his success. Pt feels he has achieved his goals during cardiac rehab, which include improved walking ability, increased endurance and improved outlook.   Pt plans to continue exercising on his own, however also expresses interest in participating in cardiac maintenance program once his wife completes her cancer treatments.

## 2015-07-27 ENCOUNTER — Encounter (HOSPITAL_COMMUNITY): Payer: Medicare Other

## 2015-07-27 DIAGNOSIS — Z23 Encounter for immunization: Secondary | ICD-10-CM | POA: Diagnosis not present

## 2015-09-03 DIAGNOSIS — Z125 Encounter for screening for malignant neoplasm of prostate: Secondary | ICD-10-CM | POA: Diagnosis not present

## 2015-09-03 DIAGNOSIS — R972 Elevated prostate specific antigen [PSA]: Secondary | ICD-10-CM | POA: Diagnosis not present

## 2015-09-03 DIAGNOSIS — E039 Hypothyroidism, unspecified: Secondary | ICD-10-CM | POA: Diagnosis not present

## 2015-10-15 ENCOUNTER — Encounter: Payer: Self-pay | Admitting: Cardiovascular Disease

## 2015-10-15 ENCOUNTER — Ambulatory Visit (INDEPENDENT_AMBULATORY_CARE_PROVIDER_SITE_OTHER): Payer: Medicare Other | Admitting: Cardiovascular Disease

## 2015-10-15 VITALS — BP 120/70 | HR 59 | Ht 62.5 in | Wt 177.8 lb

## 2015-10-15 DIAGNOSIS — I480 Paroxysmal atrial fibrillation: Secondary | ICD-10-CM

## 2015-10-15 DIAGNOSIS — I2510 Atherosclerotic heart disease of native coronary artery without angina pectoris: Secondary | ICD-10-CM | POA: Diagnosis not present

## 2015-10-15 DIAGNOSIS — I255 Ischemic cardiomyopathy: Secondary | ICD-10-CM | POA: Diagnosis not present

## 2015-10-15 NOTE — Patient Instructions (Signed)

## 2015-10-15 NOTE — Progress Notes (Signed)
Chief Complaint  Patient presents with  . Follow-up    PAF, SOME SWELLING IN ANKLE    History of Present Illness: 81 y.o. male with history of HTN, HLD, CAD, atrial fibrillation who is here today for cardiac follow up. He was admitted to [facility] 03/25/15 with chest pain/NSTEMI. He underwent cardiac catheterization on 03/26/2015 which showed 100% distal LAD stenosis, 80% ostial D1 stenosis, 75% ramus stenosis, 60% RPDA stenosis, 30% proximal RCA stenosis. Left ventricular size was normal, mild left ventricular systolic dysfunction, apical akinesis to dyskinesis. It was felt that distal LAD was probably embolic with apical wall motion and mobility. Although he had a high-grade proximal circumflex stenosis around 75%, however prior to this event he was asymptomatic and this lesion was felt to not likely be flow limiting. Medical therapy was recommended by Dr. Allyson SabalBerry who performed his cath. An outpatient Myoview was also recommended to assess for physiologic significance. His echocardiogram showed an ejection fraction of 35% with anteroapical wall motion abnormalities.While hospitalized, telemetry showed bradycardia and nonsustained VT however he was asymptomatic. Dr. Allyson SabalBerry recommended a 30 day event monitor on discharge. HCTZ was discontinued due to low BP. His lisinopril was cut back to 10 mg daily. He was seen back here shortly after discharge by Dr. Patty SermonsBrackbill. His event monitor showed sinus rhythm with episodes of atrial fibrillation. He was not symptomatic with this. Eliquis was started in addition to his aspirin. Stress myoview June 2016 with small apical defect with minimal ischemia.   He is here today for follow up. He has no SOB, palpitations. No LE edema. No chest pain. He wants to get back into cardiac rehab maintenance program.   Primary Care Physician: [name]   Past Medical History  Diagnosis Date  . Hypertension   . Hyperlipidemia   . Thyroid nodule   . Spinal stenosis    . Anemia   . Obesity (BMI 30-39.9)   . Chronic kidney disease (CKD), stage III (moderate)   . Hypothyroidism   . CAD (coronary artery disease)     Cath 03/26/2015 100% distal LAD stenosis, 80% ostial D1 stenosis, 75% ramus stenosis, 60% RPDA stenosis, 30% proximal RCA stenosis. Medical therapy, outpt myoview to assess LCx lesion    Past Surgical History  Procedure Laterality Date  . Tonsillectomy    . Pilonidal cyst excision    . Cardiac catheterization N/A 03/26/2015    Procedure: Left Heart Cath and Coronary Angiography;  Surgeon: Runell GessJonathan J Berry, MD;  [facility] INVASIVE CV LAB;  Service: Cardiovascular;  Laterality: N/A;    Current Outpatient Prescriptions  Medication Sig Dispense Refill  . acetaminophen (TYLENOL) 325 MG tablet Take 325 mg by mouth every 6 (six) hours as needed (pain).    Marland Kitchen. apixaban (ELIQUIS) 5 MG TABS tablet Take 1 tablet (5 mg total) by mouth 2 (two) times daily. 60 tablet 5  . aspirin 81 MG chewable tablet Chew 1 tablet (81 mg total) by mouth daily.    Marland Kitchen. atorvastatin (LIPITOR) 80 MG tablet Take 1 tablet (80 mg total) by mouth daily at 6 PM. 30 tablet 5  . Bioflavonoid Products (ESTER-C/BIOFLAVONOIDS) TABS Take 0.5 tablets by mouth daily. 1/2 tablet:  250 mg Vitamin C, 27.5 mg Calcium Ascorbate, 26 mg Citrus Bioflavonoid Complex    . fluticasone (FLONASE) 50 MCG/ACT nasal spray Place 1 spray into both nostrils daily as needed (seasonal allergies).    Marland Kitchen. levothyroxine (SYNTHROID, LEVOTHROID) 100 MCG tablet Take 100 mcg by mouth daily  before breakfast.    . lisinopril (PRINIVIL,ZESTRIL) 10 MG tablet Take 1 tablet (10 mg total) by mouth at bedtime. 30 tablet 5  . loratadine (CLARITIN) 10 MG tablet Take 10 mg by mouth daily.    . Menthol-Zinc Oxide (GOLD BOND EX) Apply 1 application topically at bedtime.    . metoprolol tartrate (LOPRESSOR) 25 MG tablet Take 0.5 tablets (12.5 mg total) by mouth 2 (two) times daily. 30 tablet 11  . nitroGLYCERIN (NITROSTAT) 0.4 MG SL  tablet Place 1 tablet (0.4 mg total) under the tongue every 5 (five) minutes x 3 doses as needed for chest pain. 25 tablet 3  . omeprazole (PRILOSEC) 20 MG capsule Take 20 mg by mouth daily.    . sildenafil (VIAGRA) 50 MG tablet Take 25 mg by mouth daily as needed for erectile dysfunction. Take 1 hour prior to sexual activity ( do not exceed 1 dose per 24 hour period)    . tamsulosin (FLOMAX) 0.4 MG CAPS capsule Take 0.4 mg by mouth daily.     No current facility-administered medications for this visit.    No Known Allergies  Social History   Social History  . Marital Status: Married    Spouse Name: N/A  . Number of Children: N/A  . Years of Education: N/A   Occupational History  . Not on file.   Social History Main Topics  . Smoking status: Former Smoker    Types: Cigars, Pipe  . Smokeless tobacco: Never Used  . Alcohol Use: 0.0 oz/week    0 Standard drinks or equivalent per week     Comment: rarely  . Drug Use: No  . Sexual Activity: Not on file   Other Topics Concern  . Not on file   Social History Narrative   Retired Psychologist, educational for [employer]    Family History  Problem Relation Age of Onset  . Heart attack Father 64    cause of death  . Prostate cancer Father   . Cancer Sister   . Heart disease Sister     Review of Systems:  As stated in the HPI and otherwise negative.   BP 120/70 mmHg  Pulse 59  Ht 5' 2.5" (1.588 m)  Wt 177 lb 12.8 oz (80.65 kg)  BMI 31.98 kg/m2  SpO2 97%  Physical Examination: General: Well developed, well nourished, NAD HEENT: OP clear, mucus membranes moist SKIN: warm, dry. No rashes. Neuro: No focal deficits Musculoskeletal: Muscle strength 5/5 all ext Psychiatric: Mood and affect normal Neck: No JVD, no carotid bruits, no thyromegaly, no lymphadenopathy. Lungs:Clear bilaterally, no wheezes, rhonci, crackles Cardiovascular: Regular rate and rhythm. No murmurs, gallops or rubs. Abdomen:Soft. Bowel sounds present.  Non-tender.  Extremities: No lower extremity edema. Pulses are 2 + in the bilateral DP/PT.  Stress myoview 04/05/15: Abnormal lexiscan nuclear stress test demonstrating a small region of scar/periinfarst ischemia involing the apex, apical lateral and apical inferior segment. EF is preserved at 58%. Mild apical hypocontractility.  Echo 03/26/15: Left ventricle: Sever hypokinesis of the entire apex, anterior wall, and septum. Some apical ballooning. Question Takot-subo. The cavity size was normal. Wall thickness was increased in a pattern of mild LVH. The estimated ejection fraction was 35%. - Aortic valve: Sclerosis without stenosis. There was no significant regurgitation. - Right ventricle: The cavity size was normal. Systolic function was normal.  EKG:  EKG is not ordered today. The ekg ordered today demonstrates   Recent Labs: 03/25/2015: Magnesium 2.1 05/10/2015: ALT 32; BUN 21; Creatinine,  Ser 1.27; Hemoglobin 15.4; Platelets 232.0; Potassium 4.3; Sodium 136; TSH 0.18*   Lipid Panel    Component Value Date/Time   CHOL 131 05/10/2015 1039   TRIG 80.0 05/10/2015 1039   HDL 45.00 05/10/2015 1039   CHOLHDL 3 05/10/2015 1039   VLDL 16.0 05/10/2015 1039   LDLCALC 70 05/10/2015 1039     Wt Readings from Last 3 Encounters:  10/15/15 177 lb 12.8 oz (80.65 kg)  07/02/15 177 lb 12.8 oz (80.65 kg)  05/10/15 177 lb 12.8 oz (80.65 kg)     Other studies Reviewed: Additional studies/ records that were reviewed today include: . Review of the above records demonstrates:    Assessment and Plan:   1. CAD/Unstable angina: Stable. Admission to [facility] May 2016 with NSTEMI. LAD occluded distally but no PCI performed. Moderately severe stenosis in the ramus intermediate. Stress myoview June 2016 with apical scar/ischemia but low risk.   2. Paroxymal atrial fibrillation: Appears to be in sinus today. Will continue Eliquis. Continue metoprolol for rate control.   3. Ischemic  cardiomyopathy: LV function improved on recent myoview. Continue medical management.    Current medicines are reviewed at length with the patient today.  The patient does not have concerns regarding medicines.  The following changes have been made:  no change  Labs/ tests ordered today include:   No orders of the defined types were placed in this encounter.    Disposition:   FU with me in 3-4 months  Signed, Verne Carrow, MD 10/15/2015 8:35 AM    [facility] [address] [place], [place], [place]  [address] Phone: [phone]; Fax: [phone]

## 2015-10-22 ENCOUNTER — Ambulatory Visit (INDEPENDENT_AMBULATORY_CARE_PROVIDER_SITE_OTHER): Payer: Medicare Other | Admitting: Podiatry

## 2015-10-22 DIAGNOSIS — B351 Tinea unguium: Secondary | ICD-10-CM

## 2015-10-22 DIAGNOSIS — L600 Ingrowing nail: Secondary | ICD-10-CM

## 2015-10-22 DIAGNOSIS — M79676 Pain in unspecified toe(s): Secondary | ICD-10-CM

## 2015-10-22 MED ORDER — CEPHALEXIN 500 MG PO CAPS: 500.0000 mg | ORAL_CAPSULE | Freq: Three times a day (TID) | ORAL | Status: DC

## 2015-10-22 NOTE — Patient Instructions (Signed)

## 2015-10-22 NOTE — Progress Notes (Signed)
Subjective: 81 y.o. returns the office today for painful, elongated, thickened toenails which he cannot trim himself. Denies any redness or drainage around the nails. He has been getting a painful ingrown toenail to the lateral part of the left big toe.Denies any surrounding redness or drainage.  Denies any acute changes since last appointment and no new complaints today. Denies any systemic complaints such as fevers, chills, nausea, vomiting.   Objective: AAO 3, NAD DP/PT pulses palpable, CRT less than 3 seconds Protective sensation intactwith Simms Weinstein monofilament Nails hypertrophic, dystrophic, elongated, brittle, discolored 10. There is tenderness overlying the nails 1-5 bilaterally. There is significant incurvation of the lateral portion of the left hallux toenail with erythema but no drainage, ascending cellulitis, fluctuance, or crepitance. There is no surrounding erythema or drainage along the nail sites other than mentioned.  No open lesions or pre-ulcerative lesions are identified. No other areas of tenderness bilateral lower extremities. No overlying edema, erythema, increased warmth. No pain with calf compression, swelling, warmth, erythema.  Assessment: Patient presents with symptomatic onychomycosis  Plan: -Treatment options including alternatives, risks, complications were discussed -Nails sharply debrided 10 without complication/bleeding -Recommended partial nail avulsion but he declined at this time. The ingrown toenail debrided. Rx Keflex. If still symptomatic after the holidays he will like proceed with the procedure. Monitor for infection. -Discussed daily foot inspection. If there are any changes, to call the office immediately.  -Follow-up in 2-3 weeks if ingrown toenail continues or sooner if any problems are to arise. In the meantime, encouraged to call the office with any questions, concerns, changes symptoms.  Ovid CurdMatthew Wagoner, DPM

## 2015-10-23 ENCOUNTER — Telehealth: Payer: Self-pay | Admitting: Cardiovascular Disease

## 2015-10-23 NOTE — Telephone Encounter (Signed)
Reviewed with pharmacist and OK to take.  I spoke with pt and gave him this information.

## 2015-10-23 NOTE — Telephone Encounter (Signed)
New message      Pt saw a foot doctor and he prescribed cephalexin 500mg  tid.  This is for toenail fungus. Is it ok to take this with his heart meds?

## 2015-11-12 ENCOUNTER — Encounter: Payer: Self-pay | Admitting: Podiatry

## 2015-11-12 ENCOUNTER — Ambulatory Visit (INDEPENDENT_AMBULATORY_CARE_PROVIDER_SITE_OTHER): Payer: Medicare Other | Admitting: Podiatry

## 2015-11-12 ENCOUNTER — Ambulatory Visit: Payer: Medicare Other | Admitting: Podiatry

## 2015-11-12 VITALS — BP 152/75 | HR 59 | Resp 14

## 2015-11-12 DIAGNOSIS — B351 Tinea unguium: Secondary | ICD-10-CM

## 2015-11-12 DIAGNOSIS — L600 Ingrowing nail: Secondary | ICD-10-CM | POA: Diagnosis not present

## 2015-11-12 NOTE — Progress Notes (Signed)
Subjective: 82 y.o. returns the office today for follow-up evaluation of left hallux ingrown toenail localized infection. He has been soaking in Epson salt soaks daily and he has finished Keflex. He states that the symptoms have greatly improved he has had no pain or redness or drainage the nail border. He elected to hold off any further procedure to remove the nail border possible. Denies any acute changes since last appointment and no new complaints today. Denies any systemic complaints such as fevers, chills, nausea, vomiting.   Objective: AAO 3, NAD DP/PT pulses palpable, CRT less than 3 seconds Protective sensation intactwith Simms Weinstein monofilament Along the lateral portion of the left hallux toenail is evidence of incurvation. There is no edema, erythema, increased warmth. There is no drainage or pus expressed. Mild incurvation on the medial nail border. The nail does appear to be somewhat hypertrophic, dystrophic. No open lesions or pre-ulcerative lesions are identified. No other areas of tenderness bilateral lower extremities. No overlying edema, erythema, increased warmth. No pain with calf compression, swelling, warmth, erythema.  Assessment: Patient presents with resolving infection left lateral hallux toenail with ingrown toenail  Plan: -Treatment options including alternatives, risks, complications were discussed -Left hallux nail border was debrided without complications or bleeding. He wishes to hold off on a partial nail avulsion this time at the symptoms have greatly improved. Continue to monitor for any reoccurrence. If in the future symptoms recur likely need partial nail avulsion. -Follow-up in 2 months for routine care or sooner if any problems arise. In the meantime, encouraged to call the office with any questions, concerns, change in symptoms.   Ovid CurdMatthew Georga Stys, DPM

## 2016-01-25 ENCOUNTER — Encounter: Payer: Self-pay | Admitting: Podiatry

## 2016-01-25 ENCOUNTER — Ambulatory Visit (INDEPENDENT_AMBULATORY_CARE_PROVIDER_SITE_OTHER): Payer: Medicare Other | Admitting: Podiatry

## 2016-01-25 ENCOUNTER — Ambulatory Visit: Payer: Medicare Other | Admitting: Podiatry

## 2016-01-25 DIAGNOSIS — B351 Tinea unguium: Secondary | ICD-10-CM | POA: Diagnosis not present

## 2016-01-25 DIAGNOSIS — M79676 Pain in unspecified toe(s): Secondary | ICD-10-CM | POA: Diagnosis not present

## 2016-01-25 NOTE — Progress Notes (Signed)
Patient ID: Randy Durham, male   DOB: [birth year], 82 y.o.   MRN: [id number] Complaint:  Visit Type: Patient returns to my office for continued preventative foot care services. Complaint: Patient states" my nails have grown long and thick and become painful to walk and wear shoes" Patient has been diagnosed with DM with no foot complications. The patient presents for preventative foot care services. No changes to ROS  Podiatric Exam: Vascular: dorsalis pedis and posterior tibial pulses are palpable bilateral. Capillary return is immediate. Temperature gradient is WNL. Skin turgor WNL  Sensorium: Normal Semmes Weinstein monofilament test. Normal tactile sensation bilaterally. Nail Exam: Pt has thick disfigured discolored nails with subungual debris noted bilateral entire nail hallux through fifth toenails Ulcer Exam: There is no evidence of ulcer or pre-ulcerative changes or infection. Orthopedic Exam: Muscle tone and strength are WNL. No limitations in general ROM. No crepitus or effusions noted. Foot type and digits show no abnormalities. Bony prominences are unremarkable. Skin: No Porokeratosis. No infection or ulcers  Diagnosis:  Onychomycosis, , Pain in right toe, pain in left toes  Treatment & Plan Procedures and Treatment: Consent by patient was obtained for treatment procedures. The patient understood the discussion of treatment and procedures well. All questions were answered thoroughly reviewed. Debridement of mycotic and hypertrophic toenails, 1 through 5 bilateral and clearing of subungual debris. No ulceration, no infection noted.  Return Visit-Office Procedure: Patient instructed to return to the office for a follow up visit 10 weeks for continued evaluation and treatment.    [name] DPM

## 2016-03-20 ENCOUNTER — Ambulatory Visit (INDEPENDENT_AMBULATORY_CARE_PROVIDER_SITE_OTHER): Payer: Medicare Other | Admitting: Podiatry

## 2016-03-20 ENCOUNTER — Encounter: Payer: Self-pay | Admitting: Podiatry

## 2016-03-20 VITALS — BP 126/62 | HR 59 | Resp 14

## 2016-03-20 DIAGNOSIS — M2580 Other specified joint disorders, unspecified joint: Secondary | ICD-10-CM

## 2016-03-20 NOTE — Progress Notes (Signed)
Subjective:     Patient ID: Randy Durham, male   DOB: Mar 07, 1934, 82 y.o.   MRN: [id number]  HPIThis patient presents with pain under his big toe joint right foot.  He says he started having pain yesterday and has difficulty walking on his right foot.  No history of trauma or injury.  He does admit to walking multiple stairs [place].  He points to the area under his big toe joint.  He has provided no self treatment or professional help.  He presents for evaluation and treatment.   Review of Systems     Objective:   Physical Exam GENERAL APPEARANCE: Alert, conversant. Appropriately groomed. No acute distress.  VASCULAR: Pedal pulses are  palpable at  [facility] and PT bilateral.  Capillary refill time is immediate to all digits,  Normal temperature gradient.  Digital hair growth is present bilateral  NEUROLOGIC: sensation is normal to 5.07 monofilament at 5/5 sites bilateral.  Light touch is intact bilateral, Muscle strength normal.  MUSCULOSKELETAL: acceptable muscle strength, tone and stability bilateral.  Intrinsic muscluature intact bilateral.  Rectus appearance of foot and digits noted bilateral. Redness swelling and pain noted under tibial sesamoid right foot.  Painfree ROM 1st MPJ right foot.  DERMATOLOGIC: skin color, texture, and turgor are within normal limits.  No preulcerative lesions or ulcers  are seen, no interdigital maceration noted.  No open lesions present.  Digital nails are asymptomatic. No drainage noted.  Redness swelling and pain elicited     Assessment:  Sesamoiditis 1st MPJ right foot.       Plan:     ROV  Dispersion pad applied right foot.  RTC 2 weeks.  Considered Mobic but patient is in eliquiss.  Patient to call on Monday if pain persists.     Helane GuntherGregory Brelyn Woehl DPM

## 2016-04-03 ENCOUNTER — Ambulatory Visit (INDEPENDENT_AMBULATORY_CARE_PROVIDER_SITE_OTHER): Payer: Medicare Other | Admitting: Podiatry

## 2016-04-03 ENCOUNTER — Encounter: Payer: Self-pay | Admitting: Podiatry

## 2016-04-03 DIAGNOSIS — M79676 Pain in unspecified toe(s): Secondary | ICD-10-CM | POA: Diagnosis not present

## 2016-04-03 DIAGNOSIS — B351 Tinea unguium: Secondary | ICD-10-CM

## 2016-04-03 NOTE — Progress Notes (Signed)
Patient ID: Randy Durham, male   DOB: Apr 13, 1934, 82 y.o.   MRN: [id number] Complaint:  Visit Type: Patient returns to my office for continued preventative foot care services. Complaint: Patient states" my nails have grown long and thick and become painful to walk and wear shoes" Patient has been diagnosed with DM with no foot complications. The patient presents for preventative foot care services. No changes to ROS  Podiatric Exam: Vascular: dorsalis pedis and posterior tibial pulses are palpable bilateral. Capillary return is immediate. Temperature gradient is WNL. Skin turgor WNL  Sensorium: Normal Semmes Weinstein monofilament test. Normal tactile sensation bilaterally. Nail Exam: Pt has thick disfigured discolored nails with subungual debris noted bilateral entire nail hallux through fifth toenails Ulcer Exam: There is no evidence of ulcer or pre-ulcerative changes or infection. Orthopedic Exam: Muscle tone and strength are WNL. No limitations in general ROM. No crepitus or effusions noted. Foot type and digits show no abnormalities. Bony prominences are unremarkable. Skin: No Porokeratosis. No infection or ulcers  Diagnosis:  Onychomycosis, , Pain in right toe, pain in left toes  Treatment & Plan Procedures and Treatment: Consent by patient was obtained for treatment procedures. The patient understood the discussion of treatment and procedures well. All questions were answered thoroughly reviewed. Debridement of mycotic and hypertrophic toenails, 1 through 5 bilateral and clearing of subungual debris. No ulceration, no infection noted.  Return Visit-Office Procedure: Patient instructed to return to the office for a follow up visit 10 weeks for continued evaluation and treatment.    Helane GuntherGregory Klaira Pesci DPM

## 2016-04-04 ENCOUNTER — Ambulatory Visit: Payer: Medicare Other | Admitting: Podiatry

## 2016-06-24 ENCOUNTER — Telehealth: Payer: Self-pay | Admitting: Cardiovascular Disease

## 2016-06-24 NOTE — Telephone Encounter (Signed)
I spoke with pt and told him it was OK to have flu shot. He is due for follow up with Dr.McAlhany. Appt made for July 23, 2016 at 4:00

## 2016-06-24 NOTE — Telephone Encounter (Signed)
New Message:    Pt wants to know if it is alright for him to get the flu shot,not here. Also wants to know when he needs his next office visit with Dr Talbert NanMaAlhany? [facility]

## 2016-06-24 NOTE — Telephone Encounter (Signed)
Unidentified voicemail. Left message to call office.

## 2016-06-26 ENCOUNTER — Encounter: Payer: Self-pay | Admitting: Podiatry

## 2016-06-26 ENCOUNTER — Ambulatory Visit (INDEPENDENT_AMBULATORY_CARE_PROVIDER_SITE_OTHER): Payer: Medicare Other | Admitting: Podiatry

## 2016-06-26 DIAGNOSIS — B351 Tinea unguium: Secondary | ICD-10-CM

## 2016-06-26 DIAGNOSIS — M79676 Pain in unspecified toe(s): Secondary | ICD-10-CM | POA: Diagnosis not present

## 2016-06-26 DIAGNOSIS — Z23 Encounter for immunization: Secondary | ICD-10-CM | POA: Diagnosis not present

## 2016-06-26 NOTE — Progress Notes (Signed)
Patient ID: Randy Durham, male   DOB: 04/27/1934, 82 y.o.   MRN: [id number] Complaint:  Visit Type: Patient returns to my office for continued preventative foot care services. Complaint: Patient states" my nails have grown long and thick and become painful to walk and wear shoes" Patient has been diagnosed with DM with no foot complications. The patient presents for preventative foot care services. No changes to ROS  Podiatric Exam: Vascular: dorsalis pedis and posterior tibial pulses are palpable bilateral. Capillary return is immediate. Temperature gradient is WNL. Skin turgor WNL  Sensorium: Normal Semmes Weinstein monofilament test. Normal tactile sensation bilaterally. Nail Exam: Pt has thick disfigured discolored nails with subungual debris noted bilateral entire nail hallux through fifth toenails Ulcer Exam: There is no evidence of ulcer or pre-ulcerative changes or infection. Orthopedic Exam: Muscle tone and strength are WNL. No limitations in general ROM. No crepitus or effusions noted. Foot type and digits show no abnormalities. Bony prominences are unremarkable. Skin: No Porokeratosis. No infection or ulcers  Diagnosis:  Onychomycosis, , Pain in right toe, pain in left toes  Treatment & Plan Procedures and Treatment: Consent by patient was obtained for treatment procedures. The patient understood the discussion of treatment and procedures well. All questions were answered thoroughly reviewed. Debridement of mycotic and hypertrophic toenails, 1 through 5 bilateral and clearing of subungual debris. No ulceration, no infection noted.  Return Visit-Office Procedure: Patient instructed to return to the office for a follow up visit 10 weeks for continued evaluation and treatment.    Ladine Kiper DPM 

## 2016-07-08 DIAGNOSIS — M25551 Pain in right hip: Secondary | ICD-10-CM | POA: Diagnosis not present

## 2016-07-17 DIAGNOSIS — R972 Elevated prostate specific antigen [PSA]: Secondary | ICD-10-CM | POA: Diagnosis not present

## 2016-07-22 DIAGNOSIS — M5441 Lumbago with sciatica, right side: Secondary | ICD-10-CM | POA: Diagnosis not present

## 2016-07-22 DIAGNOSIS — M545 Low back pain: Secondary | ICD-10-CM | POA: Diagnosis not present

## 2016-07-23 ENCOUNTER — Ambulatory Visit (INDEPENDENT_AMBULATORY_CARE_PROVIDER_SITE_OTHER): Payer: Medicare Other | Admitting: Cardiovascular Disease

## 2016-07-23 ENCOUNTER — Encounter: Payer: Self-pay | Admitting: Cardiovascular Disease

## 2016-07-23 VITALS — BP 132/78 | HR 69 | Ht 62.5 in | Wt 183.6 lb

## 2016-07-23 DIAGNOSIS — I2510 Atherosclerotic heart disease of native coronary artery without angina pectoris: Secondary | ICD-10-CM

## 2016-07-23 DIAGNOSIS — I255 Ischemic cardiomyopathy: Secondary | ICD-10-CM

## 2016-07-23 DIAGNOSIS — I480 Paroxysmal atrial fibrillation: Secondary | ICD-10-CM | POA: Diagnosis not present

## 2016-07-23 MED ORDER — NITROGLYCERIN 0.4 MG SL SUBL: 0.4000 mg | SUBLINGUAL_TABLET | SUBLINGUAL | 6 refills | Status: DC | PRN

## 2016-07-23 NOTE — Patient Instructions (Addendum)

## 2016-07-23 NOTE — Progress Notes (Signed)
Chief Complaint  Patient presents with  . Back Pain    History of Present Illness: 82 y.o. male with history of HTN, HLD, CAD, atrial fibrillation who is here today for cardiac follow up. He was admitted to [facility] 03/25/15 with chest pain/NSTEMI. He underwent cardiac catheterization on 03/26/2015 which showed 100% distal LAD stenosis, 80% ostial D1 stenosis, 75% ramus stenosis, 60% RPDA stenosis, 30% proximal RCA stenosis. Left ventricular size was normal, mild left ventricular systolic dysfunction, apical akinesis to dyskinesis. It was felt that distal LAD was probably embolic with apical wall motion and mobility. Although he had a high-grade proximal circumflex stenosis around 75%, however prior to this event he was asymptomatic and this lesion was felt to not likely be flow limiting. Medical therapy was recommended by Dr. Allyson Sabal who performed his cath. An outpatient Myoview was also recommended to assess for physiologic significance. His echocardiogram showed an ejection fraction of 35% with anteroapical wall motion abnormalities.While hospitalized, telemetry showed bradycardia and nonsustained VT however he was asymptomatic. Dr. Allyson Sabal recommended a 30 day event monitor on discharge. HCTZ was discontinued due to low BP. His lisinopril was cut back to 10 mg daily. He was seen back here shortly after discharge by Dr. Patty Sermons. His event monitor showed sinus rhythm with episodes of atrial fibrillation. He was not symptomatic with this. Eliquis was started in addition to his aspirin. Stress myoview June 2016 with small apical defect with minimal ischemia.   He is here today for follow up. He has no SOB, palpitations. No LE edema. No chest pain. He has been having back pain with radiation to his right leg. He has seen orthopedics Dr. Luiz Blare and has an MRI planned.   Primary Care Physician: Boneta Lucks, NP   Past Medical History:  Diagnosis Date  . Anemia   . CAD (coronary artery disease)     Cath 03/26/2015 100% distal LAD stenosis, 80% ostial D1 stenosis, 75% ramus stenosis, 60% RPDA stenosis, 30% proximal RCA stenosis. Medical therapy, outpt myoview to assess LCx lesion  . Chronic kidney disease (CKD), stage III (moderate)   . Hyperlipidemia   . Hypertension   . Hypothyroidism   . Obesity (BMI 30-39.9)   . Spinal stenosis   . Thyroid nodule     Past Surgical History:  Procedure Laterality Date  . CARDIAC CATHETERIZATION N/A 03/26/2015   Procedure: Left Heart Cath and Coronary Angiography;  Surgeon: Runell Gess, MD;  [facility]: [facility] INVASIVE CV LAB;  Service: Cardiovascular;  Laterality: N/A;  . PILONIDAL CYST EXCISION    . TONSILLECTOMY      Current Outpatient Prescriptions  Medication Sig Dispense Refill  . acetaminophen (TYLENOL) 325 MG tablet Take 325 mg by mouth every 6 (six) hours as needed (pain).    Marland Kitchen apixaban (ELIQUIS) 5 MG TABS tablet Take 1 tablet (5 mg total) by mouth 2 (two) times daily. 60 tablet 5  . aspirin 81 MG chewable tablet Chew 1 tablet (81 mg total) by mouth daily. (Patient taking differently: Chew 81 mg by mouth 2 (two) times daily. )    . atorvastatin (LIPITOR) 80 MG tablet Take 1 tablet (80 mg total) by mouth daily at 6 PM. 30 tablet 5  . Bioflavonoid Products (ESTER-C/BIOFLAVONOIDS) TABS Take 0.5 tablets by mouth daily. 1/2 tablet:  250 mg Vitamin C, 27.5 mg Calcium Ascorbate, 26 mg Citrus Bioflavonoid Complex    . fluticasone (FLONASE) 50 MCG/ACT nasal spray Place 1 spray into both nostrils daily as needed (  seasonal allergies).    Marland Kitchen. FLUZONE HIGH-DOSE 0.5 ML SUSY Inject as directed once.  0  . Iron TABS Take by mouth every other day. One tablet every other day    . levothyroxine (SYNTHROID, LEVOTHROID) 100 MCG tablet Take 100 mcg by mouth daily before breakfast.    . lisinopril (PRINIVIL,ZESTRIL) 10 MG tablet Take 1 tablet (10 mg total) by mouth at bedtime. 30 tablet 5  . loratadine (CLARITIN) 10 MG tablet Take 10 mg by mouth daily.    .  Menthol-Zinc Oxide (GOLD BOND EX) Apply 1 application topically at bedtime.    . metoprolol tartrate (LOPRESSOR) 25 MG tablet Take 0.5 tablets (12.5 mg total) by mouth 2 (two) times daily. 30 tablet 11  . nitroGLYCERIN (NITROSTAT) 0.4 MG SL tablet Place 1 tablet (0.4 mg total) under the tongue every 5 (five) minutes x 3 doses as needed for chest pain. 25 tablet 6  . omeprazole (PRILOSEC) 20 MG capsule Take 20 mg by mouth daily.    . sildenafil (VIAGRA) 50 MG tablet Take 25 mg by mouth daily as needed for erectile dysfunction. Take 1 hour prior to sexual activity ( do not exceed 1 dose per 24 hour period)    . tamsulosin (FLOMAX) 0.4 MG CAPS capsule Take 0.4 mg by mouth daily.     No current facility-administered medications for this visit.     No Known Allergies  Social History   Social History  . Marital status: Married    Spouse name: N/A  . Number of children: N/A  . Years of education: N/A   Occupational History  . Not on file.   Social History Main Topics  . Smoking status: Former Smoker    Types: Cigars, Pipe  . Smokeless tobacco: Never Used  . Alcohol use 0.0 oz/week     Comment: rarely  . Drug use: No  . Sexual activity: Not on file   Other Topics Concern  . Not on file   Social History Narrative   Retired Psychologist, educationalexecutive for [employer]    Family History  Problem Relation Age of Onset  . Heart attack Father 5583    cause of death  . Prostate cancer Father   . Cancer Sister   . Heart disease Sister     Review of Systems:  As stated in the HPI and otherwise negative.   BP 132/78 (BP Location: Left Arm, Patient Position: Sitting, Cuff Size: Normal)   Pulse 69   Ht 5' 2.5" (1.588 m)   Wt 83.3 kg (183 lb 9.6 oz)   BMI 33.05 kg/m   Physical Examination: General: Well developed, well nourished, NAD  HEENT: OP clear, mucus membranes moist  SKIN: warm, dry. No rashes. Neuro: No focal deficits  Musculoskeletal: Muscle strength 5/5 all ext  Psychiatric: Mood  and affect normal  Neck: No JVD, no carotid bruits, no thyromegaly, no lymphadenopathy.  Lungs:Clear bilaterally, no wheezes, rhonci, crackles Cardiovascular: Regular rate and rhythm. No murmurs, gallops or rubs. Abdomen:Soft. Bowel sounds present. Non-tender.  Extremities: No lower extremity edema. Pulses are 2 + in the bilateral DP/PT.  Stress myoview 04/05/15: Abnormal lexiscan nuclear stress test demonstrating a small region of scar/periinfarst ischemia involing the apex, apical lateral and apical inferior segment. EF is preserved at 58%. Mild apical hypocontractility.  Echo 03/26/15: Left ventricle: Sever hypokinesis of the entire apex, anterior wall, and septum. Some apical ballooning. Question Takot-subo. The cavity size was normal. Wall thickness was increased in a pattern of mild  LVH. The estimated ejection fraction was 35%. - Aortic valve: Sclerosis without stenosis. There was no significant regurgitation. - Right ventricle: The cavity size was normal. Systolic function was normal.  EKG:  EKG is ordered today. The ekg ordered today demonstrates Sinus rhythm, rate 69 bpm. 1st degree AV block. LVH  Recent Labs: No results found for requested labs within last 8760 hours.   Lipid Panel    Component Value Date/Time   CHOL 131 05/10/2015 1039   TRIG 80.0 05/10/2015 1039   HDL 45.00 05/10/2015 1039   CHOLHDL 3 05/10/2015 1039   VLDL 16.0 05/10/2015 1039   LDLCALC 70 05/10/2015 1039     Wt Readings from Last 3 Encounters:  07/23/16 83.3 kg (183 lb 9.6 oz)  10/15/15 80.6 kg (177 lb 12.8 oz)  07/02/15 80.6 kg (177 lb 12.8 oz)     Other studies Reviewed: Additional studies/ records that were reviewed today include: . Review of the above records demonstrates:    Assessment and Plan:   1. CAD without angina: He has no chest pain to suggest progression of his CAD. Admission to [facility] May 2016 with NSTEMI. LAD occluded distally but no PCI performed. Moderately severe  stenosis in the ramus intermediate. Stress myoview June 2016 with apical scar/ischemia but low risk.   2. Paroxymal atrial fibrillation: He is in sinus today. Will continue Eliquis. Continue metoprolol for rate control.   3. Ischemic cardiomyopathy: LV function improved on myoview in 2016. Continue medical management.    Current medicines are reviewed at length with the patient today.  The patient does not have concerns regarding medicines.  The following changes have been made:  no change  Labs/ tests ordered today include:   Orders Placed This Encounter  Procedures  . EKG 12-Lead    Disposition:   FU with me in 6 months  Signed, Verne Carrow, MD 07/23/2016 4:47 PM    [facility] [address] [place], [place], [place]  [address] Phone: [phone]; Fax: [phone]

## 2016-07-29 DIAGNOSIS — M545 Low back pain: Secondary | ICD-10-CM | POA: Diagnosis not present

## 2016-08-02 DIAGNOSIS — M4806 Spinal stenosis, lumbar region: Secondary | ICD-10-CM | POA: Diagnosis not present

## 2016-08-02 DIAGNOSIS — M5441 Lumbago with sciatica, right side: Secondary | ICD-10-CM | POA: Diagnosis not present

## 2016-08-02 DIAGNOSIS — M545 Low back pain: Secondary | ICD-10-CM | POA: Diagnosis not present

## 2016-08-02 DIAGNOSIS — M25561 Pain in right knee: Secondary | ICD-10-CM | POA: Diagnosis not present

## 2016-08-04 DIAGNOSIS — M5416 Radiculopathy, lumbar region: Secondary | ICD-10-CM | POA: Diagnosis not present

## 2016-08-06 DIAGNOSIS — M25561 Pain in right knee: Secondary | ICD-10-CM | POA: Diagnosis not present

## 2016-08-07 ENCOUNTER — Telehealth: Payer: Self-pay | Admitting: Cardiovascular Disease

## 2016-08-07 NOTE — Telephone Encounter (Signed)
New message    Request for surgical clearance:  1. What type of surgery is being performed? Epidural steroid injection   2. When is this surgery scheduled? pending  3. Are there any medications that need to be held prior to surgery and how long? eliquis and asa   4. Name of physician performing surgery? Dr. Claria DiceHao Wang   5. What is your office phone and fax number? [phone] / fax .[phone]

## 2016-08-08 ENCOUNTER — Encounter: Payer: Self-pay | Admitting: Cardiovascular Disease

## 2016-08-08 NOTE — Telephone Encounter (Signed)
Can we fax my letter to the fax number in the phone note? Thanks, chris

## 2016-08-08 NOTE — Telephone Encounter (Signed)
Letter faxed.

## 2016-08-13 DIAGNOSIS — M25561 Pain in right knee: Secondary | ICD-10-CM | POA: Diagnosis not present

## 2016-09-04 ENCOUNTER — Encounter: Payer: Self-pay | Admitting: Podiatry

## 2016-09-04 ENCOUNTER — Ambulatory Visit (INDEPENDENT_AMBULATORY_CARE_PROVIDER_SITE_OTHER): Payer: Medicare Other | Admitting: Podiatry

## 2016-09-04 VITALS — Ht 62.5 in | Wt 183.0 lb

## 2016-09-04 DIAGNOSIS — B351 Tinea unguium: Secondary | ICD-10-CM

## 2016-09-04 DIAGNOSIS — M79676 Pain in unspecified toe(s): Secondary | ICD-10-CM | POA: Diagnosis not present

## 2016-09-04 NOTE — Progress Notes (Signed)
Patient ID: Randy Durham, male   DOB: 1934-06-17, 82 y.o.   MRN: [id number] Complaint:  Visit Type: Patient returns to my office for continued preventative foot care services. Complaint: Patient states" my nails have grown long and thick and become painful to walk and wear shoes" Patient has been diagnosed with DM with no foot complications. The patient presents for preventative foot care services. No changes to ROS  Podiatric Exam: Vascular: dorsalis pedis and posterior tibial pulses are palpable bilateral. Capillary return is immediate. Temperature gradient is WNL. Skin turgor WNL  Sensorium: Normal Semmes Weinstein monofilament test. Normal tactile sensation bilaterally. Nail Exam: Pt has thick disfigured discolored nails with subungual debris noted bilateral entire nail hallux through fifth toenails Ulcer Exam: There is no evidence of ulcer or pre-ulcerative changes or infection. Orthopedic Exam: Muscle tone and strength are WNL. No limitations in general ROM. No crepitus or effusions noted. Foot type and digits show no abnormalities. Bony prominences are unremarkable. Skin: No Porokeratosis. No infection or ulcers  Diagnosis:  Onychomycosis, , Pain in right toe, pain in left toes  Treatment & Plan Procedures and Treatment: Consent by patient was obtained for treatment procedures. The patient understood the discussion of treatment and procedures well. All questions were answered thoroughly reviewed. Debridement of mycotic and hypertrophic toenails, 1 through 5 bilateral and clearing of subungual debris. No ulceration, no infection noted.  Return Visit-Office Procedure: Patient instructed to return to the office for a follow up visit 10 weeks for continued evaluation and treatment.    Randy Durham DPM

## 2016-10-03 ENCOUNTER — Telehealth: Payer: Self-pay | Admitting: Cardiovascular Disease

## 2016-10-03 NOTE — Telephone Encounter (Signed)
New Message:    Please call,pt says he just need to talk to you.

## 2016-10-03 NOTE — Telephone Encounter (Signed)
I spoke with pt. He saw Dr. Clifton JamesMcAlhany at the end of September with 6 month follow up planned.  Pt is asking if he can come in sooner. He feels fine but is anxious about waiting 6 months.  He would like appt after the first of the year.  Appt made for him to see Dr. Clifton JamesMcAlhany on 12/05/16 at 9:15.

## 2016-11-08 DIAGNOSIS — R05 Cough: Secondary | ICD-10-CM | POA: Diagnosis not present

## 2016-11-08 DIAGNOSIS — J069 Acute upper respiratory infection, unspecified: Secondary | ICD-10-CM | POA: Diagnosis not present

## 2016-11-13 ENCOUNTER — Encounter: Payer: Self-pay | Admitting: Podiatry

## 2016-11-13 ENCOUNTER — Ambulatory Visit (INDEPENDENT_AMBULATORY_CARE_PROVIDER_SITE_OTHER): Payer: Medicare Other | Admitting: Podiatry

## 2016-11-13 VITALS — Ht 62.5 in | Wt 183.0 lb

## 2016-11-13 DIAGNOSIS — M79676 Pain in unspecified toe(s): Secondary | ICD-10-CM

## 2016-11-13 DIAGNOSIS — B351 Tinea unguium: Secondary | ICD-10-CM | POA: Diagnosis not present

## 2016-11-13 NOTE — Progress Notes (Signed)
Patient ID: Randy Durham, male   DOB: 06/28/1934, 82 y.o.   MRN: [id number] Complaint:  Visit Type: Patient returns to my office for continued preventative foot care services. Complaint: Patient states" my nails have grown long and thick and become painful to walk and wear shoes" Patient has been diagnosed with DM with no foot complications. The patient presents for preventative foot care services. No changes to ROS  Podiatric Exam: Vascular: dorsalis pedis and posterior tibial pulses are palpable bilateral. Capillary return is immediate. Temperature gradient is WNL. Skin turgor WNL  Sensorium: Normal Semmes Weinstein monofilament test. Normal tactile sensation bilaterally. Nail Exam: Pt has thick disfigured discolored nails with subungual debris noted bilateral entire nail hallux through fifth toenails Ulcer Exam: There is no evidence of ulcer or pre-ulcerative changes or infection. Orthopedic Exam: Muscle tone and strength are WNL. No limitations in general ROM. No crepitus or effusions noted. Foot type and digits show no abnormalities. Bony prominences are unremarkable. Skin: No Porokeratosis. No infection or ulcers  Diagnosis:  Onychomycosis, , Pain in right toe, pain in left toes  Treatment & Plan Procedures and Treatment: Consent by patient was obtained for treatment procedures. The patient understood the discussion of treatment and procedures well. All questions were answered thoroughly reviewed. Debridement of mycotic and hypertrophic toenails, 1 through 5 bilateral and clearing of subungual debris. No ulceration, no infection noted.  Return Visit-Office Procedure: Patient instructed to return to the office for a follow up visit 10 weeks for continued evaluation and treatment.    Griffin Dewilde DPM 

## 2016-12-02 ENCOUNTER — Telehealth: Payer: Self-pay | Admitting: Cardiovascular Disease

## 2016-12-02 NOTE — Telephone Encounter (Signed)
Called patient.  Advised patient since his appt is early to come fasting.  He is currently on a statin and hasn't had lipids checked since 2016.  He expressed understanding and appreciation.

## 2016-12-02 NOTE — Telephone Encounter (Signed)
Thanks. cdm 

## 2016-12-02 NOTE — Telephone Encounter (Signed)
New message      Pt has an appt on 12-05-16 with Dr Clifton JamesMcAlhany.  He want to know if he needs to be fasting.  Please call

## 2016-12-04 NOTE — Progress Notes (Signed)
Chief Complaint  Patient presents with  . Follow-up    History of Present Illness: 83 yo male with history of HTN, HLD, CAD, atrial fibrillation who is here today for cardiac follow up. He was admitted to [facility] 03/25/15 with chest pain/NSTEMI. He underwent cardiac catheterization on 03/26/2015 which showed 100% distal LAD stenosis, 80% ostial D1 stenosis, 75% ramus stenosis, 60% RPDA stenosis, 30% proximal RCA stenosis. Left ventricular size was normal, mild left ventricular systolic dysfunction, apical akinesis to dyskinesis. It was felt that distal LAD was probably embolic with apical wall motion and mobility. Medical management of CAD recommended by Dr. Allyson Sabal who performed his cath. Echocardiogram showed an ejection fraction of 35% with anteroapical wall motion abnormalities.While hospitalized, telemetry showed bradycardia and nonsustained VT however he was asymptomatic. He was seen back here shortly after discharge by Dr. Patty Sermons. His event monitor showed sinus rhythm with episodes of atrial fibrillation. He was not symptomatic with this. Eliquis was started in addition to his aspirin. Stress myoview June 2016 with small apical defect with minimal ischemia.   He is here today for follow up. He has no SOB, palpitations. No LE edema. No chest pain. HIs back pain and leg pain is better.   Primary Care Physician: Boneta Lucks, NP   Past Medical History:  Diagnosis Date  . Anemia   . CAD (coronary artery disease)    Cath 03/26/2015 100% distal LAD stenosis, 80% ostial D1 stenosis, 75% ramus stenosis, 60% RPDA stenosis, 30% proximal RCA stenosis. Medical therapy, outpt myoview to assess LCx lesion  . Chronic kidney disease (CKD), stage III (moderate)   . Hyperlipidemia   . Hypertension   . Hypothyroidism   . Obesity (BMI 30-39.9)   . Spinal stenosis   . Thyroid nodule     Past Surgical History:  Procedure Laterality Date  . CARDIAC CATHETERIZATION N/A 03/26/2015   Procedure:  Left Heart Cath and Coronary Angiography;  Surgeon: Runell Gess, MD;  Location: [facility] INVASIVE CV LAB;  Service: Cardiovascular;  Laterality: N/A;  . PILONIDAL CYST EXCISION    . TONSILLECTOMY      Current Outpatient Prescriptions  Medication Sig Dispense Refill  . acetaminophen (TYLENOL) 325 MG tablet Take 325 mg by mouth every 6 (six) hours as needed (pain).    Marland Kitchen apixaban (ELIQUIS) 5 MG TABS tablet Take 1 tablet (5 mg total) by mouth 2 (two) times daily. 60 tablet 5  . aspirin 81 MG chewable tablet Chew 1 tablet (81 mg total) by mouth daily. (Patient taking differently: Chew 81 mg by mouth 2 (two) times daily. )    . atorvastatin (LIPITOR) 80 MG tablet Take 1 tablet (80 mg total) by mouth daily at 6 PM. 30 tablet 5  . fluticasone (FLONASE) 50 MCG/ACT nasal spray Place 1 spray into both nostrils daily as needed (seasonal allergies).    . Iron TABS Take by mouth every other day. One tablet every other day    . levothyroxine (SYNTHROID, LEVOTHROID) 112 MCG tablet Take 112 mcg by mouth daily before breakfast.    . lisinopril (PRINIVIL,ZESTRIL) 10 MG tablet Take 1 tablet (10 mg total) by mouth at bedtime. 30 tablet 5  . Menthol-Zinc Oxide (GOLD BOND EX) Apply 1 application topically at bedtime.    . metoprolol tartrate (LOPRESSOR) 25 MG tablet Take 0.5 tablets (12.5 mg total) by mouth 2 (two) times daily. 30 tablet 11  . Multiple Vitamins-Minerals (CENTRUM SILVER PO) Take 1 tablet by mouth daily.    Marland Kitchen  nitroGLYCERIN (NITROSTAT) 0.4 MG SL tablet Place 1 tablet (0.4 mg total) under the tongue every 5 (five) minutes x 3 doses as needed for chest pain. 25 tablet 6  . omeprazole (PRILOSEC) 20 MG capsule Take 20 mg by mouth daily.    . sildenafil (VIAGRA) 50 MG tablet Take 25 mg by mouth daily as needed for erectile dysfunction. Take 1 hour prior to sexual activity ( do not exceed 1 dose per 24 hour period)    . tamsulosin (FLOMAX) 0.4 MG CAPS capsule Take 0.4 mg by mouth daily.     No current  facility-administered medications for this visit.     No Known Allergies  Social History   Social History  . Marital status: Married    Spouse name: N/A  . Number of children: N/A  . Years of education: N/A   Occupational History  . Not on file.   Social History Main Topics  . Smoking status: Former Smoker    Types: Cigars, Pipe  . Smokeless tobacco: Never Used  . Alcohol use 0.0 oz/week     Comment: rarely  . Drug use: No  . Sexual activity: Not on file   Other Topics Concern  . Not on file   Social History Narrative   Retired Psychologist, educational for [employer]    Family History  Problem Relation Age of Onset  . Heart attack Father 52    cause of death  . Prostate cancer Father   . Cancer Sister   . Heart disease Sister     Review of Systems:  As stated in the HPI and otherwise negative.   BP (!) 144/82 (BP Location: Left Arm)   Pulse (!) 59   Ht 5\' 3"  (1.6 m)   Wt 181 lb (82.1 kg)   BMI 32.06 kg/m   Physical Examination: General: Well developed, well nourished, NAD  HEENT: OP clear, mucus membranes moist  SKIN: warm, dry. No rashes. Neuro: No focal deficits  Musculoskeletal: Muscle strength 5/5 all ext  Psychiatric: Mood and affect normal  Neck: No JVD, no carotid bruits, no thyromegaly, no lymphadenopathy.  Lungs:Clear bilaterally, no wheezes, rhonci, crackles Cardiovascular: Regular rate and rhythm. No murmurs, gallops or rubs. Abdomen:Soft. Bowel sounds present. Non-tender.  Extremities: No lower extremity edema. Pulses are 2 + in the bilateral DP/PT.  Stress myoview 04/05/15: Abnormal lexiscan nuclear stress test demonstrating a small region of scar/periinfarst ischemia involing the apex, apical lateral and apical inferior segment. EF is preserved at 58%. Mild apical hypocontractility.  Echo 03/26/15: Left ventricle: Sever hypokinesis of the entire apex, anterior wall, and septum. Some apical ballooning. Question Takot-subo. The cavity size was  normal. Wall thickness was increased in a pattern of mild LVH. The estimated ejection fraction was 35%. - Aortic valve: Sclerosis without stenosis. There was no significant regurgitation. - Right ventricle: The cavity size was normal. Systolic function was normal.  EKG:  EKG is not ordered today. The ekg ordered today demonstrates   Recent Labs: No results found for requested labs within last 8760 hours.   Lipid Panel    Component Value Date/Time   CHOL 131 05/10/2015 1039   TRIG 80.0 05/10/2015 1039   HDL 45.00 05/10/2015 1039   CHOLHDL 3 05/10/2015 1039   VLDL 16.0 05/10/2015 1039   LDLCALC 70 05/10/2015 1039     Wt Readings from Last 3 Encounters:  12/05/16 181 lb (82.1 kg)  11/13/16 183 lb (83 kg)  09/04/16 183 lb (83 kg)  Other studies Reviewed: Additional studies/ records that were reviewed today include: . Review of the above records demonstrates:    Assessment and Plan:   1. CAD without angina: He has no chest pain suggestive of angina. [facility] with [facility]. LAD occluded distally but no PCI performed. Moderately severe stenosis in the ramus intermediate. Stress myoview June 2016 with apical scar/ischemia but low risk. Continue medical management of CAD with ASA, statin, beta blocker.   2. Paroxymal atrial fibrillation: He is in sinus today. Will continue Eliquis. Continue metoprolol for rate control.   3. Ischemic cardiomyopathy: LV function improved on myoview in 2016. Continue medical management.    Current medicines are reviewed at length with the patient today.  The patient does not have concerns regarding medicines.  The following changes have been made:  no change  Labs/ tests ordered today include:   No orders of the defined types were placed in this encounter.   Disposition:   FU with me in 6 months  Signed, Verne Carrowhristopher Kerrianne Jeng, MD 12/05/2016 9:34 AM    [facility] [address], [place],  [place]  [address] Phone: [phone]; Fax: [phone]

## 2016-12-05 ENCOUNTER — Encounter: Payer: Self-pay | Admitting: Cardiovascular Disease

## 2016-12-05 ENCOUNTER — Ambulatory Visit (INDEPENDENT_AMBULATORY_CARE_PROVIDER_SITE_OTHER): Payer: Medicare Other | Admitting: Cardiovascular Disease

## 2016-12-05 VITALS — BP 144/82 | HR 59 | Ht 63.0 in | Wt 181.0 lb

## 2016-12-05 DIAGNOSIS — I480 Paroxysmal atrial fibrillation: Secondary | ICD-10-CM

## 2016-12-05 DIAGNOSIS — I255 Ischemic cardiomyopathy: Secondary | ICD-10-CM | POA: Diagnosis not present

## 2016-12-05 DIAGNOSIS — I2510 Atherosclerotic heart disease of native coronary artery without angina pectoris: Secondary | ICD-10-CM | POA: Diagnosis not present

## 2016-12-05 NOTE — Patient Instructions (Signed)
Medication Instructions:  Your physician recommends that you continue on your current medications as directed. Please refer to the Current Medication list given to you today.   Labwork: none  Testing/Procedures: none  Follow-Up: Your physician recommends that you schedule a follow-up appointment in: 6 months. Please call our office in 3 months to schedule this appointment   Any Other Special Instructions Will Be Listed Below (If Applicable).     If you need a refill on your cardiac medications before your next appointment, please call your pharmacy.   

## 2017-02-05 ENCOUNTER — Ambulatory Visit: Payer: Medicare Other | Admitting: Podiatry

## 2017-02-11 ENCOUNTER — Ambulatory Visit (INDEPENDENT_AMBULATORY_CARE_PROVIDER_SITE_OTHER): Payer: Medicare Other | Admitting: Podiatry

## 2017-02-11 ENCOUNTER — Encounter: Payer: Self-pay | Admitting: Podiatry

## 2017-02-11 DIAGNOSIS — B351 Tinea unguium: Secondary | ICD-10-CM | POA: Diagnosis not present

## 2017-02-11 DIAGNOSIS — M79676 Pain in unspecified toe(s): Secondary | ICD-10-CM

## 2017-02-11 NOTE — Progress Notes (Signed)
Patient ID: Randy Durham, male   DOB: 05/16/1934, 82 y.o.   MRN: [id number] Complaint:  Visit Type: Patient returns to my office for continued preventative foot care services. Complaint: Patient states" my nails have grown long and thick and become painful to walk and wear shoes" Patient has been diagnosed with DM with no foot complications. The patient presents for preventative foot care services. No changes to ROS  Podiatric Exam: Vascular: dorsalis pedis and posterior tibial pulses are palpable bilateral. Capillary return is immediate. Temperature gradient is WNL. Skin turgor WNL  Sensorium: Normal Semmes Weinstein monofilament test. Normal tactile sensation bilaterally. Nail Exam: Pt has thick disfigured discolored nails with subungual debris noted bilateral entire nail hallux through fifth toenails Ulcer Exam: There is no evidence of ulcer or pre-ulcerative changes or infection. Orthopedic Exam: Muscle tone and strength are WNL. No limitations in general ROM. No crepitus or effusions noted. Foot type and digits show no abnormalities. Bony prominences are unremarkable. Skin: No Porokeratosis. No infection or ulcers  Diagnosis:  Onychomycosis, , Pain in right toe, pain in left toes  Treatment & Plan Procedures and Treatment: Consent by patient was obtained for treatment procedures. The patient understood the discussion of treatment and procedures well. All questions were answered thoroughly reviewed. Debridement of mycotic and hypertrophic toenails, 1 through 5 bilateral and clearing of subungual debris. No ulceration, no infection noted.  Return Visit-Office Procedure: Patient instructed to return to the office for a follow up visit 10 weeks for continued evaluation and treatment.    Whitaker Holderman DPM 

## 2017-03-16 DIAGNOSIS — L309 Dermatitis, unspecified: Secondary | ICD-10-CM | POA: Diagnosis not present

## 2017-03-20 ENCOUNTER — Other Ambulatory Visit: Payer: Self-pay | Admitting: *Deleted

## 2017-03-20 DIAGNOSIS — I480 Paroxysmal atrial fibrillation: Secondary | ICD-10-CM

## 2017-03-25 ENCOUNTER — Other Ambulatory Visit: Payer: Medicare Other

## 2017-03-25 ENCOUNTER — Encounter: Payer: Self-pay | Admitting: Cardiovascular Disease

## 2017-03-25 ENCOUNTER — Ambulatory Visit (INDEPENDENT_AMBULATORY_CARE_PROVIDER_SITE_OTHER): Payer: Medicare Other | Admitting: Cardiovascular Disease

## 2017-03-25 VITALS — BP 130/80 | HR 60 | Ht 63.0 in | Wt 181.0 lb

## 2017-03-25 DIAGNOSIS — I2510 Atherosclerotic heart disease of native coronary artery without angina pectoris: Secondary | ICD-10-CM

## 2017-03-25 DIAGNOSIS — I480 Paroxysmal atrial fibrillation: Secondary | ICD-10-CM

## 2017-03-25 DIAGNOSIS — I255 Ischemic cardiomyopathy: Secondary | ICD-10-CM

## 2017-03-25 NOTE — Patient Instructions (Signed)
Medication Instructions:  Your physician recommends that you continue on your current medications as directed. Please refer to the Current Medication list given to you today.   Labwork: Lab work to be done today--BMP, CBC  Testing/Procedures: Your physician has requested that you have an echocardiogram. Echocardiography is a painless test that uses sound waves to create images of your heart. It provides your doctor with information about the size and shape of your heart and how well your heart's chambers and valves are working. This procedure takes approximately one hour. There are no restrictions for this procedure.    Follow-Up: Your physician recommends that you schedule a follow-up appointment in: 6 months. Please call our office in about 3 months to schedule this appointment.     Any Other Special Instructions Will Be Listed Below (If Applicable).     If you need a refill on your cardiac medications before your next appointment, please call your pharmacy.

## 2017-03-25 NOTE — Progress Notes (Signed)
Chief Complaint  Patient presents with  . Back Pain    History of Present Illness: 83 yo male with history of HTN, HLD, CAD, and atrial fibrillation who is here today for cardiac follow up. He was admitted to [facility] 03/25/15 with a NSTEMI. Cardiac cath 03/26/2015 showed 100% distal LAD stenosis, 80% ostial D1 stenosis, 75% ramus stenosis, 60% RPDA stenosis, 30% proximal RCA stenosis. Left ventricular size was normal, mild left ventricular systolic dysfunction, apical akinesis to dyskinesis. It was felt that distal LAD was probably embolic with apical wall motion and mobility. Medical management of CAD recommended by Dr. Allyson Sabal who performed his cath. Echocardiogram May 2016 with LVEF of 35% with anteroapical wall motion abnormalities.Event monitor showed sinus rhythm with episodes of atrial fibrillation. He was started on Eliquis. Stress myoview June 2016 with small apical defect with minimal ischemia.   He is here today for follow up. The patient denies any chest pain, dyspnea, palpitations, lower extremity edema, orthopnea, PND, dizziness, near syncope or syncope.   Primary Care Physician: Boneta Lucks, NP   Past Medical History:  Diagnosis Date  . Anemia   . CAD (coronary artery disease)    Cath 03/26/2015 100% distal LAD stenosis, 80% ostial D1 stenosis, 75% ramus stenosis, 60% RPDA stenosis, 30% proximal RCA stenosis. Medical therapy, outpt myoview to assess LCx lesion  . Chronic kidney disease (CKD), stage III (moderate)   . Hyperlipidemia   . Hypertension   . Hypothyroidism   . Obesity (BMI 30-39.9)   . Spinal stenosis   . Thyroid nodule     Past Surgical History:  Procedure Laterality Date  . CARDIAC CATHETERIZATION N/A 03/26/2015   Procedure: Left Heart Cath and Coronary Angiography;  Surgeon: Runell Gess, MD;  Location: [facility] INVASIVE CV LAB;  Service: Cardiovascular;  Laterality: N/A;  . PILONIDAL CYST EXCISION    . TONSILLECTOMY      Current Outpatient  Prescriptions  Medication Sig Dispense Refill  . acetaminophen (TYLENOL) 325 MG tablet Take 325 mg by mouth every 6 (six) hours as needed (pain).    Marland Kitchen apixaban (ELIQUIS) 5 MG TABS tablet Take 1 tablet (5 mg total) by mouth 2 (two) times daily. 60 tablet 5  . aspirin EC 81 MG tablet Take 81 mg by mouth daily.    Marland Kitchen atorvastatin (LIPITOR) 80 MG tablet Take 1 tablet (80 mg total) by mouth daily at 6 PM. 30 tablet 5  . fluticasone (FLONASE) 50 MCG/ACT nasal spray Place 1 spray into both nostrils daily as needed (seasonal allergies).    Marland Kitchen levothyroxine (SYNTHROID, LEVOTHROID) 112 MCG tablet Take 112 mcg by mouth daily before breakfast.    . lisinopril (PRINIVIL,ZESTRIL) 10 MG tablet Take 1 tablet (10 mg total) by mouth at bedtime. 30 tablet 5  . Menthol-Zinc Oxide (GOLD BOND EX) Apply 1 application topically at bedtime.    . metoprolol tartrate (LOPRESSOR) 25 MG tablet Take 0.5 tablets (12.5 mg total) by mouth 2 (two) times daily. 30 tablet 11  . Multiple Vitamins-Minerals (CENTRUM SILVER PO) Take 1 tablet by mouth daily.    . nitroGLYCERIN (NITROSTAT) 0.4 MG SL tablet Place 1 tablet (0.4 mg total) under the tongue every 5 (five) minutes x 3 doses as needed for chest pain. 25 tablet 6  . omeprazole (PRILOSEC) 20 MG capsule Take 20 mg by mouth daily.    . sildenafil (VIAGRA) 50 MG tablet Take 25 mg by mouth daily as needed for erectile dysfunction. Take 1 hour prior  to sexual activity ( do not exceed 1 dose per 24 hour period)    . tamsulosin (FLOMAX) 0.4 MG CAPS capsule Take 0.4 mg by mouth daily.     No current facility-administered medications for this visit.     No Known Allergies  Social History   Social History  . Marital status: Married    Spouse name: N/A  . Number of children: N/A  . Years of education: N/A   Occupational History  . Not on file.   Social History Main Topics  . Smoking status: Former Smoker    Types: Cigars, Pipe  . Smokeless tobacco: Never Used  . Alcohol use  0.0 oz/week     Comment: rarely  . Drug use: No  . Sexual activity: Not on file   Other Topics Concern  . Not on file   Social History Narrative   Retired Psychologist, educational for [employer]    Family History  Problem Relation Age of Onset  . Heart attack Father 19       cause of death  . Prostate cancer Father   . Cancer Sister   . Heart disease Sister     Review of Systems:  As stated in the HPI and otherwise negative.   BP 130/80   Pulse 60   Ht 5\' 3"  (1.6 m)   Wt 181 lb (82.1 kg)   SpO2 96%   BMI 32.06 kg/m   Physical Examination: General: Well developed, well nourished, NAD  HEENT: OP clear, mucus membranes moist  SKIN: warm, dry. No rashes. Neuro: No focal deficits  Musculoskeletal: Muscle strength 5/5 all ext  Psychiatric: Mood and affect normal  Neck: No JVD, no carotid bruits, no thyromegaly, no lymphadenopathy.  Lungs:Clear bilaterally, no wheezes, rhonci, crackles Cardiovascular: Regular rate and rhythm. No murmurs, gallops or rubs. Abdomen:Soft. Bowel sounds present. Non-tender.  Extremities: No lower extremity edema. Pulses are 2 + in the bilateral DP/PT.  Stress myoview 04/05/15: Abnormal lexiscan nuclear stress test demonstrating a small region of scar/periinfarst ischemia involing the apex, apical lateral and apical inferior segment. EF is preserved at 58%. Mild apical hypocontractility.  Echo 03/26/15: Left ventricle: Sever hypokinesis of the entire apex, anterior wall, and septum. Some apical ballooning. Question Takot-subo. The cavity size was normal. Wall thickness was increased in a pattern of mild LVH. The estimated ejection fraction was 35%. - Aortic valve: Sclerosis without stenosis. There was no significant regurgitation. - Right ventricle: The cavity size was normal. Systolic function was normal.  EKG:  EKG is ordered today. The ekg ordered today demonstrates Sinus brady, rate 54 bpm. 1st degree AV block. Incomplete RBBB. LAFB.  LVH.   Recent Labs: No results found for requested labs within last 8760 hours.   Lipid Panel    Component Value Date/Time   CHOL 131 05/10/2015 1039   TRIG 80.0 05/10/2015 1039   HDL 45.00 05/10/2015 1039   CHOLHDL 3 05/10/2015 1039   VLDL 16.0 05/10/2015 1039   LDLCALC 70 05/10/2015 1039     Wt Readings from Last 3 Encounters:  03/25/17 181 lb (82.1 kg)  12/05/16 181 lb (82.1 kg)  11/13/16 183 lb (83 kg)     Other studies Reviewed: Additional studies/ records that were reviewed today include: . Review of the above records demonstrates:    Assessment and Plan:   1. CAD without angina: He is having no chest pain suggestive of angina. He is known to have moderately severe CAD but has done well with  medical therapy. Most recent stress testing in June 2016 was low risk. Will continue ASA, statin and beta blocker.    2. Paroxymal atrial fibrillation: He is in sinus. Will continue beta blocker for rate control and Eliquis for anti-coagulation.  3. Ischemic cardiomyopathy: Continue beta blocker and Ace-inh. Will arrange echo now to assess LVEF.   Current medicines are reviewed at length with the patient today.  The patient does not have concerns regarding medicines.  The following changes have been made:  no change  Labs/ tests ordered today include:   Orders Placed This Encounter  Procedures  . Basic Metabolic Panel (BMET)  . CBC w/Diff  . EKG 12-Lead  . ECHOCARDIOGRAM COMPLETE    Disposition:   FU with me in 6 months  Signed, Verne Carrowhristopher McAlhany, MD 03/25/2017 9:46 AM    [facility] [address], [place], [place]  [address] Phone: [phone]; Fax: [phone]

## 2017-03-26 LAB — BASIC METABOLIC PANEL
BUN / CREAT RATIO: 17 (ref 10–24)
BUN: 19 mg/dL (ref 8–27)
CHLORIDE: 102 mmol/L (ref 96–106)
CO2: 23 mmol/L (ref 18–29)
Calcium: 9.5 mg/dL (ref 8.6–10.2)
Creatinine, Ser: 1.09 mg/dL (ref 0.76–1.27)
GFR calc non Af Amer: 64 mL/min/{1.73_m2} (ref >59)
GFR, EST AFRICAN AMERICAN: 74 mL/min/{1.73_m2} (ref >59)
GLUCOSE: 83 mg/dL (ref 65–99)
POTASSIUM: 4.8 mmol/L (ref 3.5–5.2)
SODIUM: 140 mmol/L (ref 134–144)

## 2017-03-26 LAB — CBC WITH DIFFERENTIAL/PLATELET
BASOS ABS: 0 10*3/uL (ref 0.0–0.2)
Basos: 1 %
EOS (ABSOLUTE): 0.1 10*3/uL (ref 0.0–0.4)
Eos: 2 %
HEMATOCRIT: 46.4 % (ref 37.5–51.0)
HEMOGLOBIN: 15.8 g/dL (ref 13.0–17.7)
Immature Grans (Abs): 0 10*3/uL (ref 0.0–0.1)
Immature Granulocytes: 0 %
LYMPHS ABS: 1.5 10*3/uL (ref 0.7–3.1)
Lymphs: 24 %
MCH: 29.3 pg (ref 26.6–33.0)
MCHC: 34.1 g/dL (ref 31.5–35.7)
MCV: 86 fL (ref 79–97)
MONOCYTES: 7 %
MONOS ABS: 0.5 10*3/uL (ref 0.1–0.9)
NEUTROS ABS: 4.2 10*3/uL (ref 1.4–7.0)
Neutrophils: 66 %
Platelets: 224 10*3/uL (ref 150–379)
RBC: 5.4 x10E6/uL (ref 4.14–5.80)
RDW: 14.2 % (ref 12.3–15.4)
WBC: 6.3 10*3/uL (ref 3.4–10.8)

## 2017-03-26 MED ORDER — APIXABAN 5 MG PO TABS: 5.0000 mg | ORAL_TABLET | Freq: Two times a day (BID) | ORAL | 5 refills | Status: DC

## 2017-03-26 NOTE — Telephone Encounter (Signed)
Age 83 Wt 82.1kg (03/25/2017) Saw Dr Clifton JamesMcAlhany on May 23rd 2018 03/25/2017 HGB 15.8 HCT 46.4 SrCr 1.09 Refill done for Eliquis 5mg  q 12 hours as requested

## 2017-04-06 ENCOUNTER — Other Ambulatory Visit: Payer: Self-pay

## 2017-04-06 ENCOUNTER — Ambulatory Visit (HOSPITAL_COMMUNITY): Payer: Medicare Other | Attending: Cardiovascular Disease

## 2017-04-06 DIAGNOSIS — E785 Hyperlipidemia, unspecified: Secondary | ICD-10-CM | POA: Diagnosis not present

## 2017-04-06 DIAGNOSIS — Z8249 Family history of ischemic heart disease and other diseases of the circulatory system: Secondary | ICD-10-CM | POA: Diagnosis not present

## 2017-04-06 DIAGNOSIS — I1310 Hypertensive heart and chronic kidney disease without heart failure, with stage 1 through stage 4 chronic kidney disease, or unspecified chronic kidney disease: Secondary | ICD-10-CM | POA: Diagnosis not present

## 2017-04-06 DIAGNOSIS — I252 Old myocardial infarction: Secondary | ICD-10-CM | POA: Insufficient documentation

## 2017-04-06 DIAGNOSIS — I255 Ischemic cardiomyopathy: Secondary | ICD-10-CM | POA: Diagnosis not present

## 2017-04-06 DIAGNOSIS — I358 Other nonrheumatic aortic valve disorders: Secondary | ICD-10-CM | POA: Insufficient documentation

## 2017-04-06 DIAGNOSIS — I2510 Atherosclerotic heart disease of native coronary artery without angina pectoris: Secondary | ICD-10-CM | POA: Diagnosis not present

## 2017-04-06 DIAGNOSIS — Z87891 Personal history of nicotine dependence: Secondary | ICD-10-CM | POA: Diagnosis not present

## 2017-04-06 DIAGNOSIS — N189 Chronic kidney disease, unspecified: Secondary | ICD-10-CM | POA: Insufficient documentation

## 2017-05-11 DIAGNOSIS — I10 Essential (primary) hypertension: Secondary | ICD-10-CM | POA: Diagnosis not present

## 2017-05-11 DIAGNOSIS — E7800 Pure hypercholesterolemia, unspecified: Secondary | ICD-10-CM | POA: Diagnosis not present

## 2017-05-11 DIAGNOSIS — N183 Chronic kidney disease, stage 3 (moderate): Secondary | ICD-10-CM | POA: Diagnosis not present

## 2017-05-11 DIAGNOSIS — I2510 Atherosclerotic heart disease of native coronary artery without angina pectoris: Secondary | ICD-10-CM | POA: Diagnosis not present

## 2017-05-11 DIAGNOSIS — I480 Paroxysmal atrial fibrillation: Secondary | ICD-10-CM | POA: Diagnosis not present

## 2017-05-13 ENCOUNTER — Encounter: Payer: Self-pay | Admitting: Podiatry

## 2017-05-13 ENCOUNTER — Ambulatory Visit (INDEPENDENT_AMBULATORY_CARE_PROVIDER_SITE_OTHER): Payer: Medicare Other | Admitting: Podiatry

## 2017-05-13 DIAGNOSIS — M79676 Pain in unspecified toe(s): Secondary | ICD-10-CM | POA: Diagnosis not present

## 2017-05-13 DIAGNOSIS — B351 Tinea unguium: Secondary | ICD-10-CM

## 2017-05-13 NOTE — Progress Notes (Signed)
Patient ID: Randy Durham, male   DOB: [birth year], 83 y.o.   MRN: [id number] Complaint:  Visit Type: Patient returns to my office for continued preventative foot care services. Complaint: Patient states" my nails have grown long and thick and become painful to walk and wear shoes" Patient has been diagnosed with DM with no foot complications. The patient presents for preventative foot care services. No changes to ROS  Podiatric Exam: Vascular: dorsalis pedis and posterior tibial pulses are palpable bilateral. Capillary return is immediate. Temperature gradient is WNL. Skin turgor WNL  Sensorium: Normal Semmes Weinstein monofilament test. Normal tactile sensation bilaterally. Nail Exam: Pt has thick disfigured discolored nails with subungual debris noted bilateral entire nail hallux through fifth toenails Ulcer Exam: There is no evidence of ulcer or pre-ulcerative changes or infection. Orthopedic Exam: Muscle tone and strength are WNL. No limitations in general ROM. No crepitus or effusions noted. Foot type and digits show no abnormalities. Bony prominences are unremarkable. Skin: No Porokeratosis. No infection or ulcers  Diagnosis:  Onychomycosis, , Pain in right toe, pain in left toes  Treatment & Plan Procedures and Treatment: Consent by patient was obtained for treatment procedures. The patient understood the discussion of treatment and procedures well. All questions were answered thoroughly reviewed. Debridement of mycotic and hypertrophic toenails, 1 through 5 bilateral and clearing of subungual debris. No ulceration, no infection noted.  Return Visit-Office Procedure: Patient instructed to return to the office for a follow up visit 10 weeks for continued evaluation and treatment.    Helane GuntherGregory Benedicto Capozzi DPM

## 2017-06-23 ENCOUNTER — Inpatient Hospital Stay (HOSPITAL_COMMUNITY)
Admission: EM | Admit: 2017-06-23 | Discharge: 2017-06-30 | DRG: 023 | Disposition: A | Discharge status: Rehabilitation Facility | Payer: Medicare Other | Attending: Neurology | Admitting: Neurology

## 2017-06-23 DIAGNOSIS — I129 Hypertensive chronic kidney disease with stage 1 through stage 4 chronic kidney disease, or unspecified chronic kidney disease: Secondary | ICD-10-CM | POA: Diagnosis present

## 2017-06-23 DIAGNOSIS — E039 Hypothyroidism, unspecified: Secondary | ICD-10-CM | POA: Diagnosis present

## 2017-06-23 DIAGNOSIS — I4891 Unspecified atrial fibrillation: Secondary | ICD-10-CM | POA: Diagnosis not present

## 2017-06-23 DIAGNOSIS — M4800 Spinal stenosis, site unspecified: Secondary | ICD-10-CM | POA: Diagnosis present

## 2017-06-23 DIAGNOSIS — K219 Gastro-esophageal reflux disease without esophagitis: Secondary | ICD-10-CM | POA: Diagnosis present

## 2017-06-23 DIAGNOSIS — Z7982 Long term (current) use of aspirin: Secondary | ICD-10-CM

## 2017-06-23 DIAGNOSIS — R1310 Dysphagia, unspecified: Secondary | ICD-10-CM | POA: Diagnosis present

## 2017-06-23 DIAGNOSIS — Z7901 Long term (current) use of anticoagulants: Secondary | ICD-10-CM

## 2017-06-23 DIAGNOSIS — Z87891 Personal history of nicotine dependence: Secondary | ICD-10-CM

## 2017-06-23 DIAGNOSIS — N183 Chronic kidney disease, stage 3 (moderate): Secondary | ICD-10-CM | POA: Diagnosis not present

## 2017-06-23 DIAGNOSIS — R410 Disorientation, unspecified: Secondary | ICD-10-CM | POA: Diagnosis present

## 2017-06-23 DIAGNOSIS — Z6831 Body mass index (BMI) 31.0-31.9, adult: Secondary | ICD-10-CM

## 2017-06-23 DIAGNOSIS — G8194 Hemiplegia, unspecified affecting left nondominant side: Secondary | ICD-10-CM | POA: Diagnosis not present

## 2017-06-23 DIAGNOSIS — R400 Somnolence: Secondary | ICD-10-CM

## 2017-06-23 DIAGNOSIS — H53462 Homonymous bilateral field defects, left side: Secondary | ICD-10-CM | POA: Diagnosis present

## 2017-06-23 DIAGNOSIS — I618 Other nontraumatic intracerebral hemorrhage: Secondary | ICD-10-CM | POA: Diagnosis not present

## 2017-06-23 DIAGNOSIS — Z8249 Family history of ischemic heart disease and other diseases of the circulatory system: Secondary | ICD-10-CM

## 2017-06-23 DIAGNOSIS — F17210 Nicotine dependence, cigarettes, uncomplicated: Secondary | ICD-10-CM | POA: Diagnosis not present

## 2017-06-23 DIAGNOSIS — I611 Nontraumatic intracerebral hemorrhage in hemisphere, cortical: Principal | ICD-10-CM

## 2017-06-23 DIAGNOSIS — I615 Nontraumatic intracerebral hemorrhage, intraventricular: Secondary | ICD-10-CM | POA: Diagnosis not present

## 2017-06-23 DIAGNOSIS — I629 Nontraumatic intracranial hemorrhage, unspecified: Secondary | ICD-10-CM

## 2017-06-23 DIAGNOSIS — I2510 Atherosclerotic heart disease of native coronary artery without angina pectoris: Secondary | ICD-10-CM | POA: Diagnosis present

## 2017-06-23 DIAGNOSIS — G936 Cerebral edema: Secondary | ICD-10-CM

## 2017-06-23 DIAGNOSIS — H539 Unspecified visual disturbance: Secondary | ICD-10-CM | POA: Diagnosis present

## 2017-06-23 DIAGNOSIS — Z4659 Encounter for fitting and adjustment of other gastrointestinal appliance and device: Secondary | ICD-10-CM

## 2017-06-23 DIAGNOSIS — I619 Nontraumatic intracerebral hemorrhage, unspecified: Secondary | ICD-10-CM | POA: Diagnosis not present

## 2017-06-23 DIAGNOSIS — I509 Heart failure, unspecified: Secondary | ICD-10-CM

## 2017-06-23 DIAGNOSIS — Z79899 Other long term (current) drug therapy: Secondary | ICD-10-CM

## 2017-06-23 DIAGNOSIS — I252 Old myocardial infarction: Secondary | ICD-10-CM

## 2017-06-23 DIAGNOSIS — Z9911 Dependence on respirator [ventilator] status: Secondary | ICD-10-CM

## 2017-06-23 DIAGNOSIS — Z01818 Encounter for other preprocedural examination: Secondary | ICD-10-CM

## 2017-06-23 DIAGNOSIS — E785 Hyperlipidemia, unspecified: Secondary | ICD-10-CM | POA: Diagnosis not present

## 2017-06-23 DIAGNOSIS — E669 Obesity, unspecified: Secondary | ICD-10-CM | POA: Diagnosis present

## 2017-06-23 LAB — CBC
HCT: 45.2 % (ref 39.0–52.0)
HEMOGLOBIN: 15.7 g/dL (ref 13.0–17.0)
MCH: 29.4 pg (ref 26.0–34.0)
MCHC: 34.7 g/dL (ref 30.0–36.0)
MCV: 84.6 fL (ref 78.0–100.0)
Platelets: 214 10*3/uL (ref 150–400)
RBC: 5.34 MIL/uL (ref 4.22–5.81)
RDW: 13.2 % (ref 11.5–15.5)
WBC: 10.5 10*3/uL (ref 4.0–10.5)

## 2017-06-23 LAB — COMPREHENSIVE METABOLIC PANEL
ALBUMIN: 4.3 g/dL (ref 3.5–5.0)
ALK PHOS: 58 U/L (ref 38–126)
ALT: 33 U/L (ref 17–63)
ANION GAP: 11 (ref 5–15)
AST: 41 U/L (ref 15–41)
BILIRUBIN TOTAL: 1.5 mg/dL — AB (ref 0.3–1.2)
BUN: 14 mg/dL (ref 6–20)
CALCIUM: 9.6 mg/dL (ref 8.9–10.3)
CO2: 22 mmol/L (ref 22–32)
Chloride: 105 mmol/L (ref 101–111)
Creatinine, Ser: 1.03 mg/dL (ref 0.61–1.24)
GFR calc Af Amer: >60 mL/min (ref >60)
GFR calc non Af Amer: >60 mL/min (ref >60)
GLUCOSE: 113 mg/dL — AB (ref 65–99)
Potassium: 4.3 mmol/L (ref 3.5–5.1)
SODIUM: 138 mmol/L (ref 135–145)
TOTAL PROTEIN: 7.2 g/dL (ref 6.5–8.1)

## 2017-06-23 NOTE — ED Triage Notes (Signed)
Onset this am, pt woke up wife @ 7:15p stated he had been up since 3am vomiting x 1, , diarrhea x 1 headache, pain behind right eye.  Pt took Tylenol.  Pt has been repeating questions, couldn't log on to computer.  Past few weeks pt has been falling sleep at table.  No urinary symptoms.  A&Ox

## 2017-06-23 NOTE — ED Notes (Signed)
Per family, pt has had NVD onset this morning with lethargy and confusion. Pt was complaining of a headache and chills, does follow commands appropriately, states it is year 53 and he is in [place] or [place].

## 2017-06-24 ENCOUNTER — Inpatient Hospital Stay (HOSPITAL_COMMUNITY): Payer: Medicare Other | Admitting: Anesthesiology

## 2017-06-24 ENCOUNTER — Inpatient Hospital Stay (HOSPITAL_COMMUNITY): Payer: Medicare Other

## 2017-06-24 ENCOUNTER — Encounter (HOSPITAL_COMMUNITY)
Admission: EM | Disposition: A | Discharge status: Rehabilitation Facility | Payer: Self-pay | Source: Home / Self Care | Attending: Neurology

## 2017-06-24 ENCOUNTER — Emergency Department (HOSPITAL_COMMUNITY): Payer: Medicare Other

## 2017-06-24 DIAGNOSIS — I10 Essential (primary) hypertension: Secondary | ICD-10-CM | POA: Diagnosis not present

## 2017-06-24 DIAGNOSIS — G936 Cerebral edema: Secondary | ICD-10-CM

## 2017-06-24 DIAGNOSIS — R0602 Shortness of breath: Secondary | ICD-10-CM | POA: Diagnosis not present

## 2017-06-24 DIAGNOSIS — I629 Nontraumatic intracranial hemorrhage, unspecified: Secondary | ICD-10-CM | POA: Insufficient documentation

## 2017-06-24 DIAGNOSIS — G8194 Hemiplegia, unspecified affecting left nondominant side: Secondary | ICD-10-CM | POA: Diagnosis present

## 2017-06-24 DIAGNOSIS — R4701 Aphasia: Secondary | ICD-10-CM | POA: Diagnosis not present

## 2017-06-24 DIAGNOSIS — N390 Urinary tract infection, site not specified: Secondary | ICD-10-CM | POA: Diagnosis present

## 2017-06-24 DIAGNOSIS — Z87891 Personal history of nicotine dependence: Secondary | ICD-10-CM | POA: Diagnosis not present

## 2017-06-24 DIAGNOSIS — H53462 Homonymous bilateral field defects, left side: Secondary | ICD-10-CM | POA: Diagnosis present

## 2017-06-24 DIAGNOSIS — A499 Bacterial infection, unspecified: Secondary | ICD-10-CM | POA: Diagnosis not present

## 2017-06-24 DIAGNOSIS — Z7982 Long term (current) use of aspirin: Secondary | ICD-10-CM | POA: Diagnosis not present

## 2017-06-24 DIAGNOSIS — N183 Chronic kidney disease, stage 3 (moderate): Secondary | ICD-10-CM | POA: Diagnosis not present

## 2017-06-24 DIAGNOSIS — I252 Old myocardial infarction: Secondary | ICD-10-CM | POA: Diagnosis not present

## 2017-06-24 DIAGNOSIS — I611 Nontraumatic intracerebral hemorrhage in hemisphere, cortical: Secondary | ICD-10-CM | POA: Diagnosis present

## 2017-06-24 DIAGNOSIS — I4891 Unspecified atrial fibrillation: Secondary | ICD-10-CM | POA: Diagnosis not present

## 2017-06-24 DIAGNOSIS — H539 Unspecified visual disturbance: Secondary | ICD-10-CM | POA: Diagnosis present

## 2017-06-24 DIAGNOSIS — R2689 Other abnormalities of gait and mobility: Secondary | ICD-10-CM | POA: Diagnosis not present

## 2017-06-24 DIAGNOSIS — I129 Hypertensive chronic kidney disease with stage 1 through stage 4 chronic kidney disease, or unspecified chronic kidney disease: Secondary | ICD-10-CM | POA: Diagnosis present

## 2017-06-24 DIAGNOSIS — S065X0A Traumatic subdural hemorrhage without loss of consciousness, initial encounter: Secondary | ICD-10-CM | POA: Diagnosis not present

## 2017-06-24 DIAGNOSIS — I6200 Nontraumatic subdural hemorrhage, unspecified: Secondary | ICD-10-CM | POA: Diagnosis not present

## 2017-06-24 DIAGNOSIS — E669 Obesity, unspecified: Secondary | ICD-10-CM | POA: Diagnosis present

## 2017-06-24 DIAGNOSIS — R4182 Altered mental status, unspecified: Secondary | ICD-10-CM | POA: Diagnosis present

## 2017-06-24 DIAGNOSIS — R627 Adult failure to thrive: Secondary | ICD-10-CM | POA: Diagnosis present

## 2017-06-24 DIAGNOSIS — I63412 Cerebral infarction due to embolism of left middle cerebral artery: Secondary | ICD-10-CM | POA: Diagnosis not present

## 2017-06-24 DIAGNOSIS — I618 Other nontraumatic intracerebral hemorrhage: Secondary | ICD-10-CM | POA: Diagnosis not present

## 2017-06-24 DIAGNOSIS — N179 Acute kidney failure, unspecified: Secondary | ICD-10-CM | POA: Diagnosis not present

## 2017-06-24 DIAGNOSIS — E785 Hyperlipidemia, unspecified: Secondary | ICD-10-CM | POA: Diagnosis present

## 2017-06-24 DIAGNOSIS — J9601 Acute respiratory failure with hypoxia: Secondary | ICD-10-CM | POA: Diagnosis not present

## 2017-06-24 DIAGNOSIS — R1310 Dysphagia, unspecified: Secondary | ICD-10-CM | POA: Diagnosis present

## 2017-06-24 DIAGNOSIS — Z66 Do not resuscitate: Secondary | ICD-10-CM | POA: Diagnosis present

## 2017-06-24 DIAGNOSIS — Z8249 Family history of ischemic heart disease and other diseases of the circulatory system: Secondary | ICD-10-CM | POA: Diagnosis not present

## 2017-06-24 DIAGNOSIS — G8929 Other chronic pain: Secondary | ICD-10-CM | POA: Diagnosis present

## 2017-06-24 DIAGNOSIS — Z6831 Body mass index (BMI) 31.0-31.9, adult: Secondary | ICD-10-CM | POA: Diagnosis not present

## 2017-06-24 DIAGNOSIS — B964 Proteus (mirabilis) (morganii) as the cause of diseases classified elsewhere: Secondary | ICD-10-CM | POA: Diagnosis present

## 2017-06-24 DIAGNOSIS — R400 Somnolence: Secondary | ICD-10-CM | POA: Diagnosis present

## 2017-06-24 DIAGNOSIS — Z79899 Other long term (current) drug therapy: Secondary | ICD-10-CM | POA: Diagnosis not present

## 2017-06-24 DIAGNOSIS — Z48811 Encounter for surgical aftercare following surgery on the nervous system: Secondary | ICD-10-CM | POA: Diagnosis not present

## 2017-06-24 DIAGNOSIS — I615 Nontraumatic intracerebral hemorrhage, intraventricular: Secondary | ICD-10-CM | POA: Diagnosis present

## 2017-06-24 DIAGNOSIS — Z7901 Long term (current) use of anticoagulants: Secondary | ICD-10-CM | POA: Diagnosis not present

## 2017-06-24 DIAGNOSIS — E039 Hypothyroidism, unspecified: Secondary | ICD-10-CM | POA: Diagnosis present

## 2017-06-24 DIAGNOSIS — I69119 Unspecified symptoms and signs involving cognitive functions following nontraumatic intracerebral hemorrhage: Secondary | ICD-10-CM | POA: Diagnosis not present

## 2017-06-24 DIAGNOSIS — I638 Other cerebral infarction: Secondary | ICD-10-CM | POA: Diagnosis not present

## 2017-06-24 DIAGNOSIS — I612 Nontraumatic intracerebral hemorrhage in hemisphere, unspecified: Secondary | ICD-10-CM | POA: Diagnosis not present

## 2017-06-24 DIAGNOSIS — R4020 Unspecified coma: Secondary | ICD-10-CM | POA: Diagnosis not present

## 2017-06-24 DIAGNOSIS — G441 Vascular headache, not elsewhere classified: Secondary | ICD-10-CM | POA: Diagnosis not present

## 2017-06-24 DIAGNOSIS — M4800 Spinal stenosis, site unspecified: Secondary | ICD-10-CM | POA: Diagnosis present

## 2017-06-24 DIAGNOSIS — I619 Nontraumatic intracerebral hemorrhage, unspecified: Secondary | ICD-10-CM | POA: Diagnosis not present

## 2017-06-24 DIAGNOSIS — I480 Paroxysmal atrial fibrillation: Secondary | ICD-10-CM | POA: Diagnosis not present

## 2017-06-24 DIAGNOSIS — R569 Unspecified convulsions: Secondary | ICD-10-CM | POA: Diagnosis present

## 2017-06-24 DIAGNOSIS — R410 Disorientation, unspecified: Secondary | ICD-10-CM | POA: Diagnosis present

## 2017-06-24 DIAGNOSIS — J9811 Atelectasis: Secondary | ICD-10-CM | POA: Diagnosis not present

## 2017-06-24 DIAGNOSIS — K219 Gastro-esophageal reflux disease without esophagitis: Secondary | ICD-10-CM | POA: Diagnosis present

## 2017-06-24 DIAGNOSIS — I2510 Atherosclerotic heart disease of native coronary artery without angina pectoris: Secondary | ICD-10-CM | POA: Diagnosis present

## 2017-06-24 DIAGNOSIS — R5383 Other fatigue: Secondary | ICD-10-CM | POA: Diagnosis not present

## 2017-06-24 HISTORY — PX: CRANIOTOMY: SHX93

## 2017-06-24 LAB — BASIC METABOLIC PANEL
ANION GAP: 11 (ref 5–15)
BUN: 15 mg/dL (ref 6–20)
CALCIUM: 9.4 mg/dL (ref 8.9–10.3)
CO2: 22 mmol/L (ref 22–32)
Chloride: 104 mmol/L (ref 101–111)
Creatinine, Ser: 1.07 mg/dL (ref 0.61–1.24)
GFR calc non Af Amer: >60 mL/min (ref >60)
Glucose, Bld: 103 mg/dL — ABNORMAL HIGH (ref 65–99)
Potassium: 4.1 mmol/L (ref 3.5–5.1)
Sodium: 137 mmol/L (ref 135–145)

## 2017-06-24 LAB — PROTIME-INR
INR: 1.04
Prothrombin Time: 13.6 seconds (ref 11.4–15.2)

## 2017-06-24 LAB — BLOOD GAS, ARTERIAL
ACID-BASE DEFICIT: 0.1 mmol/L (ref 0.0–2.0)
Bicarbonate: 23.5 mmol/L (ref 20.0–28.0)
DRAWN BY: 244861
FIO2: 40
O2 SAT: 99.2 %
PATIENT TEMPERATURE: 98.6
PCO2 ART: 34.9 mmHg (ref 32.0–48.0)
PEEP: 5 cmH2O
PH ART: 7.443 (ref 7.350–7.450)
PO2 ART: 187 mmHg — AB (ref 83.0–108.0)
Pressure support: 5 cmH2O

## 2017-06-24 LAB — TYPE AND SCREEN
ABO/RH(D): A POS
Antibody Screen: NEGATIVE

## 2017-06-24 LAB — CBC
HEMATOCRIT: 39.6 % (ref 39.0–52.0)
Hemoglobin: 13.3 g/dL (ref 13.0–17.0)
MCH: 28.7 pg (ref 26.0–34.0)
MCHC: 33.6 g/dL (ref 30.0–36.0)
MCV: 85.3 fL (ref 78.0–100.0)
Platelets: 205 10*3/uL (ref 150–400)
RBC: 4.64 MIL/uL (ref 4.22–5.81)
RDW: 13.2 % (ref 11.5–15.5)
WBC: 9.1 10*3/uL (ref 4.0–10.5)

## 2017-06-24 LAB — PHOSPHORUS: PHOSPHORUS: 3.6 mg/dL (ref 2.5–4.6)

## 2017-06-24 LAB — HEMOGLOBIN A1C
HEMOGLOBIN A1C: 5.6 % (ref 4.8–5.6)
MEAN PLASMA GLUCOSE: 114.02 mg/dL

## 2017-06-24 LAB — TROPONIN I: Troponin I: <0.03 ng/mL (ref <0.03)

## 2017-06-24 LAB — MRSA PCR SCREENING: MRSA BY PCR: NEGATIVE

## 2017-06-24 LAB — ABO/RH: ABO/RH(D): A POS

## 2017-06-24 LAB — GLUCOSE, CAPILLARY: Glucose-Capillary: 116 mg/dL — ABNORMAL HIGH (ref 65–99)

## 2017-06-24 LAB — TRIGLYCERIDES: Triglycerides: 101 mg/dL (ref <150)

## 2017-06-24 LAB — AMMONIA: AMMONIA: 12 umol/L (ref 9–35)

## 2017-06-24 LAB — MAGNESIUM: MAGNESIUM: 1.9 mg/dL (ref 1.7–2.4)

## 2017-06-24 SURGERY — CRANIOTOMY HEMATOMA EVACUATION SUBDURAL
Anesthesia: General | Site: Head | Laterality: Right

## 2017-06-24 MED ORDER — PROPOFOL 1000 MG/100ML IV EMUL
0.0000 ug/kg/min | INTRAVENOUS | Status: DC
Start: 2017-06-24 — End: 2017-06-24
  Administered 2017-06-24: 40 ug/kg/min via INTRAVENOUS

## 2017-06-24 MED ORDER — THROMBIN 5000 UNITS EX SOLR
CUTANEOUS | Status: AC
  Filled 2017-06-24: qty 5000

## 2017-06-24 MED ORDER — ONDANSETRON HCL 4 MG/2ML IJ SOLN: 4.0000 mg | Freq: Once | INTRAMUSCULAR | Status: DC | PRN

## 2017-06-24 MED ORDER — PANTOPRAZOLE SODIUM 40 MG IV SOLR
40.0000 mg | Freq: Every day | INTRAVENOUS | Status: DC
  Administered 2017-06-24 – 2017-06-25 (×2): 40 mg via INTRAVENOUS
  Filled 2017-06-24 (×2): qty 40

## 2017-06-24 MED ORDER — SENNOSIDES-DOCUSATE SODIUM 8.6-50 MG PO TABS
1.0000 | ORAL_TABLET | Freq: Two times a day (BID) | ORAL | Status: DC
  Administered 2017-06-25 – 2017-06-30 (×10): 1 via ORAL
  Filled 2017-06-24 (×11): qty 1

## 2017-06-24 MED ORDER — LIDOCAINE HCL (CARDIAC) 20 MG/ML IV SOLN
INTRAVENOUS | Status: DC | PRN
  Administered 2017-06-24: 40 mg via INTRATRACHEAL

## 2017-06-24 MED ORDER — ACETAMINOPHEN 325 MG PO TABS
650.0000 mg | ORAL_TABLET | ORAL | Status: DC | PRN
  Administered 2017-06-25 – 2017-06-26 (×3): 650 mg
  Filled 2017-06-24 (×3): qty 2

## 2017-06-24 MED ORDER — ACETAMINOPHEN 325 MG PO TABS: 650.0000 mg | ORAL_TABLET | ORAL | Status: DC | PRN

## 2017-06-24 MED ORDER — THROMBIN 5000 UNITS EX SOLR
OROMUCOSAL | Status: DC | PRN
  Administered 2017-06-24: 07:00:00 via TOPICAL

## 2017-06-24 MED ORDER — MORPHINE SULFATE (PF) 4 MG/ML IV SOLN: 1.0000 mg | INTRAVENOUS | Status: DC | PRN

## 2017-06-24 MED ORDER — FENTANYL CITRATE (PF) 100 MCG/2ML IJ SOLN: 50.0000 ug | INTRAMUSCULAR | Status: DC | PRN

## 2017-06-24 MED ORDER — FENTANYL CITRATE (PF) 100 MCG/2ML IJ SOLN
50.0000 ug | INTRAMUSCULAR | Status: DC | PRN
  Administered 2017-06-24 (×2): 50 ug via INTRAVENOUS
  Filled 2017-06-24 (×2): qty 2

## 2017-06-24 MED ORDER — PROTHROMBIN COMPLEX CONC HUMAN 500 UNITS IV KIT
4412.0000 [IU] | PACK | Status: AC
  Administered 2017-06-24: 4412 [IU] via INTRAVENOUS
  Filled 2017-06-24: qty 176

## 2017-06-24 MED ORDER — CHLORHEXIDINE GLUCONATE 0.12% ORAL RINSE (MEDLINE KIT)
15.0000 mL | Freq: Two times a day (BID) | OROMUCOSAL | Status: DC
  Administered 2017-06-24 (×2): 15 mL via OROMUCOSAL

## 2017-06-24 MED ORDER — ACETAMINOPHEN 160 MG/5ML PO SOLN: 650.0000 mg | ORAL | Status: DC | PRN

## 2017-06-24 MED ORDER — ROCURONIUM BROMIDE 10 MG/ML (PF) SYRINGE
PREFILLED_SYRINGE | INTRAVENOUS | Status: AC
  Filled 2017-06-24: qty 5

## 2017-06-24 MED ORDER — ACETAMINOPHEN 160 MG/5ML PO SOLN
650.0000 mg | ORAL | Status: DC | PRN
  Filled 2017-06-24: qty 20.3

## 2017-06-24 MED ORDER — PROPOFOL 10 MG/ML IV BOLUS
INTRAVENOUS | Status: DC | PRN
  Administered 2017-06-24: 140 mg via INTRAVENOUS
  Administered 2017-06-24: 60 mg via INTRAVENOUS

## 2017-06-24 MED ORDER — IOPAMIDOL (ISOVUE-370) INJECTION 76%
50.0000 mL | Freq: Once | INTRAVENOUS | Status: AC | PRN
  Administered 2017-06-24: 50 mL via INTRAVENOUS

## 2017-06-24 MED ORDER — NALOXONE HCL 0.4 MG/ML IJ SOLN: 0.0800 mg | INTRAMUSCULAR | Status: DC | PRN

## 2017-06-24 MED ORDER — LIDOCAINE-EPINEPHRINE 0.5 %-1:200000 IJ SOLN
INTRAMUSCULAR | Status: AC
  Filled 2017-06-24: qty 1

## 2017-06-24 MED ORDER — HYDRALAZINE HCL 20 MG/ML IJ SOLN
5.0000 mg | INTRAMUSCULAR | Status: DC | PRN
  Administered 2017-06-24 – 2017-06-28 (×2): 5 mg via INTRAVENOUS
  Filled 2017-06-24 (×2): qty 1

## 2017-06-24 MED ORDER — THROMBIN 20000 UNITS EX SOLR
CUTANEOUS | Status: AC
  Filled 2017-06-24: qty 20000

## 2017-06-24 MED ORDER — FENTANYL CITRATE (PF) 250 MCG/5ML IJ SOLN
INTRAMUSCULAR | Status: AC
Start: 2017-06-24 — End: 2017-06-24
  Filled 2017-06-24: qty 5

## 2017-06-24 MED ORDER — LABETALOL HCL 5 MG/ML IV SOLN
10.0000 mg | INTRAVENOUS | Status: DC | PRN
  Administered 2017-06-28: 20 mg via INTRAVENOUS
  Filled 2017-06-24: qty 4

## 2017-06-24 MED ORDER — SODIUM CHLORIDE 0.9 % IV SOLN
500.0000 mg | Freq: Two times a day (BID) | INTRAVENOUS | Status: DC
  Administered 2017-06-24 (×2): 500 mg via INTRAVENOUS
  Filled 2017-06-24 (×3): qty 5

## 2017-06-24 MED ORDER — SUCCINYLCHOLINE CHLORIDE 200 MG/10ML IV SOSY
PREFILLED_SYRINGE | INTRAVENOUS | Status: AC
  Filled 2017-06-24: qty 10

## 2017-06-24 MED ORDER — LEVOTHYROXINE SODIUM 112 MCG PO TABS
112.0000 ug | ORAL_TABLET | Freq: Every day | ORAL | Status: DC
  Filled 2017-06-24: qty 1

## 2017-06-24 MED ORDER — METOPROLOL TARTRATE 12.5 MG HALF TABLET
12.5000 mg | ORAL_TABLET | Freq: Two times a day (BID) | ORAL | Status: DC
  Administered 2017-06-25 – 2017-06-30 (×10): 12.5 mg
  Filled 2017-06-24 (×12): qty 1

## 2017-06-24 MED ORDER — POTASSIUM CHLORIDE IN NACL 20-0.9 MEQ/L-% IV SOLN
INTRAVENOUS | Status: DC
Start: 2017-06-24 — End: 2017-06-30
  Administered 2017-06-24 – 2017-06-28 (×6): via INTRAVENOUS
  Administered 2017-06-29: 1000 mL via INTRAVENOUS
  Administered 2017-06-29 – 2017-06-30 (×2): via INTRAVENOUS
  Filled 2017-06-24 (×16): qty 1000

## 2017-06-24 MED ORDER — HYDROMORPHONE HCL 1 MG/ML IJ SOLN
0.5000 mg | INTRAMUSCULAR | Status: DC | PRN
  Administered 2017-06-24 – 2017-06-25 (×4): 0.5 mg via INTRAVENOUS
  Filled 2017-06-24 (×4): qty 0.5

## 2017-06-24 MED ORDER — ONDANSETRON HCL 4 MG/2ML IJ SOLN
4.0000 mg | INTRAMUSCULAR | Status: DC | PRN
  Administered 2017-06-24 – 2017-06-25 (×2): 4 mg via INTRAVENOUS
  Filled 2017-06-24 (×3): qty 2

## 2017-06-24 MED ORDER — LIDOCAINE-EPINEPHRINE 0.5 %-1:200000 IJ SOLN
INTRAMUSCULAR | Status: DC | PRN
  Administered 2017-06-24: 7 mL

## 2017-06-24 MED ORDER — SUCCINYLCHOLINE 20MG/ML (10ML) SYRINGE FOR MEDFUSION PUMP - OPTIME
INTRAMUSCULAR | Status: DC | PRN
  Administered 2017-06-24: 120 mg via INTRAVENOUS

## 2017-06-24 MED ORDER — HEMOSTATIC AGENTS (NO CHARGE) OPTIME
TOPICAL | Status: DC | PRN
  Administered 2017-06-24: 1 via TOPICAL

## 2017-06-24 MED ORDER — THROMBIN 20000 UNITS EX SOLR
CUTANEOUS | Status: DC | PRN
  Administered 2017-06-24: 07:00:00 via TOPICAL

## 2017-06-24 MED ORDER — STROKE: EARLY STAGES OF RECOVERY BOOK
Freq: Once | Status: DC
  Filled 2017-06-24 (×3): qty 1

## 2017-06-24 MED ORDER — TAMSULOSIN HCL 0.4 MG PO CAPS
0.4000 mg | ORAL_CAPSULE | Freq: Every day | ORAL | Status: DC
  Administered 2017-06-25 – 2017-06-29 (×4): 0.4 mg via ORAL
  Filled 2017-06-24 (×5): qty 1

## 2017-06-24 MED ORDER — LIDOCAINE 2% (20 MG/ML) 5 ML SYRINGE
INTRAMUSCULAR | Status: AC
  Filled 2017-06-24: qty 5

## 2017-06-24 MED ORDER — LISINOPRIL 10 MG PO TABS: 10.0000 mg | ORAL_TABLET | Freq: Every day | ORAL | Status: DC

## 2017-06-24 MED ORDER — PHENYLEPHRINE 40 MCG/ML (10ML) SYRINGE FOR IV PUSH (FOR BLOOD PRESSURE SUPPORT)
PREFILLED_SYRINGE | INTRAVENOUS | Status: AC
  Filled 2017-06-24: qty 10

## 2017-06-24 MED ORDER — GADOBENATE DIMEGLUMINE 529 MG/ML IV SOLN
18.0000 mL | Freq: Once | INTRAVENOUS | Status: AC | PRN
  Administered 2017-06-24: 18 mL via INTRAVENOUS

## 2017-06-24 MED ORDER — ACETAMINOPHEN 650 MG RE SUPP: 650.0000 mg | RECTAL | Status: DC | PRN

## 2017-06-24 MED ORDER — PROPOFOL 1000 MG/100ML IV EMUL
0.0000 ug/kg/min | INTRAVENOUS | Status: DC
  Filled 2017-06-24: qty 100

## 2017-06-24 MED ORDER — HYDROCODONE-ACETAMINOPHEN 5-325 MG PO TABS
1.0000 | ORAL_TABLET | ORAL | Status: DC | PRN
Start: 2017-06-24 — End: 2017-06-24

## 2017-06-24 MED ORDER — BACITRACIN ZINC 500 UNIT/GM EX OINT
TOPICAL_OINTMENT | CUTANEOUS | Status: DC | PRN
  Administered 2017-06-24: 1 via TOPICAL

## 2017-06-24 MED ORDER — 0.9 % SODIUM CHLORIDE (POUR BTL) OPTIME
TOPICAL | Status: DC | PRN
  Administered 2017-06-24 (×3): 1000 mL

## 2017-06-24 MED ORDER — HYDROCODONE-ACETAMINOPHEN 5-325 MG PO TABS: 1.0000 | ORAL_TABLET | ORAL | Status: DC | PRN

## 2017-06-24 MED ORDER — METOPROLOL TARTRATE 25 MG PO TABS: 12.5000 mg | ORAL_TABLET | Freq: Two times a day (BID) | ORAL | Status: DC

## 2017-06-24 MED ORDER — ONDANSETRON HCL 4 MG PO TABS: 4.0000 mg | ORAL_TABLET | ORAL | Status: DC | PRN

## 2017-06-24 MED ORDER — PHENYLEPHRINE HCL 10 MG/ML IJ SOLN
INTRAMUSCULAR | Status: DC | PRN
  Administered 2017-06-24: 20 ug/min via INTRAVENOUS

## 2017-06-24 MED ORDER — BACITRACIN ZINC 500 UNIT/GM EX OINT
TOPICAL_OINTMENT | CUTANEOUS | Status: AC
  Filled 2017-06-24: qty 28.35

## 2017-06-24 MED ORDER — PANTOPRAZOLE SODIUM 40 MG IV SOLR: 40.0000 mg | Freq: Every day | INTRAVENOUS | Status: DC

## 2017-06-24 MED ORDER — PROPOFOL 1000 MG/100ML IV EMUL
INTRAVENOUS | Status: AC
  Filled 2017-06-24: qty 100

## 2017-06-24 MED ORDER — FENTANYL CITRATE (PF) 100 MCG/2ML IJ SOLN: 25.0000 ug | INTRAMUSCULAR | Status: DC | PRN

## 2017-06-24 MED ORDER — FENTANYL CITRATE (PF) 250 MCG/5ML IJ SOLN
INTRAMUSCULAR | Status: AC
  Filled 2017-06-24: qty 5

## 2017-06-24 MED ORDER — CEFAZOLIN SODIUM-DEXTROSE 2-3 GM-% IV SOLR
INTRAVENOUS | Status: DC | PRN
  Administered 2017-06-24: 2 g via INTRAVENOUS

## 2017-06-24 MED ORDER — SODIUM CHLORIDE 0.9 % IV SOLN: 250.0000 mL | INTRAVENOUS | Status: DC | PRN

## 2017-06-24 MED ORDER — LACTATED RINGERS IV SOLN
INTRAVENOUS | Status: DC | PRN
  Administered 2017-06-24 (×2): via INTRAVENOUS

## 2017-06-24 MED ORDER — ORAL CARE MOUTH RINSE
15.0000 mL | Freq: Four times a day (QID) | OROMUCOSAL | Status: DC
  Administered 2017-06-24 – 2017-06-25 (×4): 15 mL via OROMUCOSAL

## 2017-06-24 MED ORDER — LACTATED RINGERS IV SOLN
INTRAVENOUS | Status: DC | PRN
  Administered 2017-06-24: 06:00:00 via INTRAVENOUS

## 2017-06-24 MED ORDER — LEVOTHYROXINE SODIUM 112 MCG PO TABS
112.0000 ug | ORAL_TABLET | Freq: Every day | ORAL | Status: DC
  Administered 2017-06-25 – 2017-06-30 (×5): 112 ug
  Filled 2017-06-24 (×6): qty 1

## 2017-06-24 MED ORDER — PROMETHAZINE HCL 25 MG PO TABS: 12.5000 mg | ORAL_TABLET | ORAL | Status: DC | PRN

## 2017-06-24 MED ORDER — PROPOFOL 10 MG/ML IV BOLUS
INTRAVENOUS | Status: AC
  Filled 2017-06-24: qty 20

## 2017-06-24 MED ORDER — NITROGLYCERIN 0.4 MG SL SUBL: 0.4000 mg | SUBLINGUAL_TABLET | SUBLINGUAL | Status: DC | PRN

## 2017-06-24 MED ORDER — CLEVIDIPINE BUTYRATE 0.5 MG/ML IV EMUL: 0.0000 mg/h | INTRAVENOUS | Status: DC

## 2017-06-24 MED ORDER — FENTANYL CITRATE (PF) 250 MCG/5ML IJ SOLN
INTRAMUSCULAR | Status: DC | PRN
  Administered 2017-06-24 (×2): 125 ug via INTRAVENOUS
  Administered 2017-06-24: 100 ug via INTRAVENOUS

## 2017-06-24 MED ORDER — ROCURONIUM 10MG/ML (10ML) SYRINGE FOR MEDFUSION PUMP - OPTIME
INTRAVENOUS | Status: DC | PRN
  Administered 2017-06-24: 20 mg via INTRAVENOUS
  Administered 2017-06-24: 30 mg via INTRAVENOUS
  Administered 2017-06-24: 50 mg via INTRAVENOUS

## 2017-06-24 MED ORDER — LABETALOL HCL 5 MG/ML IV SOLN: 20.0000 mg | Freq: Once | INTRAVENOUS | Status: DC

## 2017-06-24 SURGICAL SUPPLY — 78 items
BENZOIN TINCTURE PRP APPL 2/3 (GAUZE/BANDAGES/DRESSINGS) IMPLANT
BLADE CLIPPER SURG (BLADE) ×8 IMPLANT
BLADE SURG 11 STRL SS (BLADE) ×4 IMPLANT
BNDG GAUZE ELAST 4 BULKY (GAUZE/BANDAGES/DRESSINGS) IMPLANT
BUR ACORN 6.0 PRECISION (BURR) ×3 IMPLANT
BUR ACORN 6.0MM PRECISION (BURR) ×1
BUR MATCHSTICK NEURO 3.0 LAGG (BURR) IMPLANT
BUR SPIRAL ROUTER 2.3 (BUR) ×3 IMPLANT
BUR SPIRAL ROUTER 2.3MM (BUR) ×1
CANISTER SUCT 3000ML PPV (MISCELLANEOUS) ×4 IMPLANT
CARTRIDGE OIL MAESTRO DRILL (MISCELLANEOUS) ×2 IMPLANT
CATH VENTRIC 35X38 W/TROCAR LG (CATHETERS) IMPLANT
COVER BACK TABLE 60X90IN (DRAPES) ×8 IMPLANT
DIFFUSER DRILL AIR PNEUMATIC (MISCELLANEOUS) ×4 IMPLANT
DRAPE INCISE IOBAN 66X45 STRL (DRAPES) ×8 IMPLANT
DRAPE NEUROLOGICAL W/INCISE (DRAPES) ×4 IMPLANT
DRAPE ORTHO SPLIT 77X108 STRL (DRAPES) ×4
DRAPE POUCH INSTRU U-SHP 10X18 (DRAPES) ×4 IMPLANT
DRAPE SURG ORHT 6 SPLT 77X108 (DRAPES) ×4 IMPLANT
DRAPE WARM FLUID 44X44 (DRAPE) ×4 IMPLANT
DRSG TELFA 3X8 NADH (GAUZE/BANDAGES/DRESSINGS) ×4 IMPLANT
DURAPREP 26ML APPLICATOR (WOUND CARE) ×8 IMPLANT
DURAPREP 6ML APPLICATOR 50/CS (WOUND CARE) ×4 IMPLANT
ELECT REM PT RETURN 9FT ADLT (ELECTROSURGICAL) ×4
ELECTRODE REM PT RTRN 9FT ADLT (ELECTROSURGICAL) ×2 IMPLANT
EVACUATOR 1/8 PVC DRAIN (DRAIN) IMPLANT
EVACUATOR SILICONE 100CC (DRAIN) IMPLANT
FORCEPS BIPOLAR SPETZLER 8 1.0 (NEUROSURGERY SUPPLIES) ×4 IMPLANT
GAUZE SPONGE 4X4 12PLY STRL (GAUZE/BANDAGES/DRESSINGS) ×4 IMPLANT
GAUZE SPONGE 4X4 12PLY STRL LF (GAUZE/BANDAGES/DRESSINGS) ×4 IMPLANT
GAUZE SPONGE 4X4 16PLY XRAY LF (GAUZE/BANDAGES/DRESSINGS) IMPLANT
GLOVE BIOGEL PI IND STRL 7.0 (GLOVE) ×4 IMPLANT
GLOVE BIOGEL PI IND STRL 7.5 (GLOVE) ×4 IMPLANT
GLOVE BIOGEL PI IND STRL 8 (GLOVE) ×4 IMPLANT
GLOVE BIOGEL PI INDICATOR 7.0 (GLOVE) ×4
GLOVE BIOGEL PI INDICATOR 7.5 (GLOVE) ×4
GLOVE BIOGEL PI INDICATOR 8 (GLOVE) ×4
GLOVE ECLIPSE 6.5 STRL STRAW (GLOVE) ×4 IMPLANT
GLOVE ECLIPSE 7.5 STRL STRAW (GLOVE) ×12 IMPLANT
GOWN STRL REUS W/ TWL LRG LVL3 (GOWN DISPOSABLE) ×6 IMPLANT
GOWN STRL REUS W/ TWL XL LVL3 (GOWN DISPOSABLE) IMPLANT
GOWN STRL REUS W/TWL 2XL LVL3 (GOWN DISPOSABLE) IMPLANT
GOWN STRL REUS W/TWL LRG LVL3 (GOWN DISPOSABLE) ×6
GOWN STRL REUS W/TWL XL LVL3 (GOWN DISPOSABLE)
GRAFT DURAGEN MATRIX 2WX2L ×4 IMPLANT
HEMOSTAT POWDER KIT SURGIFOAM (HEMOSTASIS) ×4 IMPLANT
HEMOSTAT SURGICEL 2X14 (HEMOSTASIS) IMPLANT
HEMOSTAT SURGICEL 2X3 (HEMOSTASIS) ×4 IMPLANT
KIT BASIN OR (CUSTOM PROCEDURE TRAY) ×4 IMPLANT
KIT ROOM TURNOVER OR (KITS) ×4 IMPLANT
MARKER SKIN DUAL TIP RULER LAB (MISCELLANEOUS) ×4 IMPLANT
NEEDLE HYPO 25X1 1.5 SAFETY (NEEDLE) ×4 IMPLANT
NS IRRIG 1000ML POUR BTL (IV SOLUTION) ×12 IMPLANT
OIL CARTRIDGE MAESTRO DRILL (MISCELLANEOUS) ×4
PACK CRANIOTOMY (CUSTOM PROCEDURE TRAY) ×4 IMPLANT
PACK LAMINECTOMY NEURO (CUSTOM PROCEDURE TRAY) ×4 IMPLANT
PAD ARMBOARD 7.5X6 YLW CONV (MISCELLANEOUS) ×12 IMPLANT
PATTIES SURGICAL .5 X.5 (GAUZE/BANDAGES/DRESSINGS) IMPLANT
PATTIES SURGICAL .5 X3 (DISPOSABLE) IMPLANT
PATTIES SURGICAL 1X1 (DISPOSABLE) IMPLANT
PLATE 1.5 5HOLE SQUARE (Plate) ×8 IMPLANT
SCREW SELF DRILL HT 1.5/4MM (Screw) ×32 IMPLANT
SPONGE LAP 4X18 X RAY DECT (DISPOSABLE) IMPLANT
SPONGE NEURO XRAY DETECT 1X3 (DISPOSABLE) IMPLANT
SPONGE SURGIFOAM ABS GEL 100 (HEMOSTASIS) ×4 IMPLANT
STAPLER SKIN PROX WIDE 3.9 (STAPLE) ×4 IMPLANT
STAPLER VISISTAT 35W (STAPLE) ×4 IMPLANT
SUT BONE WAX W31G (SUTURE) IMPLANT
SUT NURALON 4 0 TR CR/8 (SUTURE) ×8 IMPLANT
SUT VIC AB 2-0 CT2 18 VCP726D (SUTURE) ×8 IMPLANT
TAPE CLOTH SURG 4X10 WHT LF (GAUZE/BANDAGES/DRESSINGS) ×4 IMPLANT
TOWEL GREEN STERILE (TOWEL DISPOSABLE) ×4 IMPLANT
TOWEL GREEN STERILE FF (TOWEL DISPOSABLE) ×4 IMPLANT
TRAY FOLEY W/METER SILVER 16FR (SET/KITS/TRAYS/PACK) ×4 IMPLANT
TUBE CONNECTING 12'X1/4 (SUCTIONS) ×1
TUBE CONNECTING 12X1/4 (SUCTIONS) ×3 IMPLANT
UNDERPAD 30X30 (UNDERPADS AND DIAPERS) ×4 IMPLANT
WATER STERILE IRR 1000ML POUR (IV SOLUTION) ×4 IMPLANT

## 2017-06-24 NOTE — ED Provider Notes (Signed)
[facility]-EMERGENCY DEPT Provider Note   CSN: [id number] Arrival date & time: 06/23/17  1741     History   Chief Complaint Chief Complaint  Patient presents with  . Altered Mental Status    HPI Randy Durham is a 83 y.o. male.  HPI  This is an 83 year old male with a history of coronary artery disease, chronic kidney disease, hyperlipidemia, hypertension who presents with altered mental status.  Family provides much of the history. Wife states that he woke her up at 7 AM this morning and he been up for several hours. He reported a slight headache and some vomiting. Wife noted that all day he seemed confused, opening up doors and not knowing where he was. She also noted that he appeared to have difficulty with vision on the left. She states that he transiently couldn't visualize the computer screen on the left. She also reports that he has been very sleepy today. Daughter was called to the house and family convinced him to come to the hospital. Patient is technically oriented 3. He is somnolent.  He is complaining of a slight frontal headache behind his right eye. He has not taken anything for the headache. They deny recent illnesses, fever, chest pain, shortness of breath, abdominal pain.  Past Medical History:  Diagnosis Date  . Anemia   . CAD (coronary artery disease)    Cath 03/26/2015 100% distal LAD stenosis, 80% ostial D1 stenosis, 75% ramus stenosis, 60% RPDA stenosis, 30% proximal RCA stenosis. Medical therapy, outpt myoview to assess LCx lesion  . Chronic kidney disease (CKD), stage III (moderate)   . Hyperlipidemia   . Hypertension   . Hypothyroidism   . Obesity (BMI 30-39.9)   . Spinal stenosis   . Thyroid nodule     Patient Active Problem List   Diagnosis Date Noted  . Paroxysmal atrial fibrillation (HCC) 03/30/2015  . Bradycardia 03/27/2015  . NSVT (nonsustained ventricular tachycardia) (HCC) 03/27/2015  . CAD- occluded distal LAD, 75-80% CFX- medical Rx 03/27/2015    . NSTEMI 03/25/15 03/25/2015  . Chronic kidney disease (CKD), stage III (moderate)   . Obesity (BMI 30-39.9)   . Spinal stenosis   . Hypertension   . Hyperlipidemia     Past Surgical History:  Procedure Laterality Date  . CARDIAC CATHETERIZATION N/A 03/26/2015   Procedure: Left Heart Cath and Coronary Angiography;  Surgeon: Runell Gess, MD;  Location: [facility] INVASIVE CV LAB;  Service: Cardiovascular;  Laterality: N/A;  . PILONIDAL CYST EXCISION    . TONSILLECTOMY         Home Medications    Prior to Admission medications   Medication Sig Start Date End Date Taking? Authorizing Provider  acetaminophen (TYLENOL) 325 MG tablet Take 325 mg by mouth every 6 (six) hours as needed (pain).    [provider]  apixaban (ELIQUIS) 5 MG TABS tablet Take 1 tablet (5 mg total) by mouth 2 (two) times daily. 03/26/17   Kathleene Hazel, MD  aspirin EC 81 MG tablet Take 81 mg by mouth daily.    [provider]  atorvastatin (LIPITOR) 80 MG tablet Take 1 tablet (80 mg total) by mouth daily at 6 PM. 03/28/15   Azalee Course, PA  fluticasone (FLONASE) 50 MCG/ACT nasal spray Place 1 spray into both nostrils daily as needed (seasonal allergies).    [provider]  levothyroxine (SYNTHROID, LEVOTHROID) 112 MCG tablet Take 112 mcg by mouth daily before breakfast.    [provider]  lisinopril (  PRINIVIL,ZESTRIL) 10 MG tablet Take 1 tablet (10 mg total) by mouth at bedtime. 03/28/15   Azalee Course, PA  Menthol-Zinc Oxide (GOLD BOND EX) Apply 1 application topically at bedtime.    [provider]  metoprolol tartrate (LOPRESSOR) 25 MG tablet Take 0.5 tablets (12.5 mg total) by mouth 2 (two) times daily. 03/28/15   Azalee Course, PA  Multiple Vitamins-Minerals (CENTRUM SILVER PO) Take 1 tablet by mouth daily.    [provider]  nitroGLYCERIN (NITROSTAT) 0.4 MG SL tablet Place 1 tablet (0.4 mg total) under the tongue every 5 (five) minutes x 3 doses as needed for  chest pain. 07/23/16   Kathleene Hazel, MD  omeprazole (PRILOSEC) 20 MG capsule Take 20 mg by mouth daily.    [provider]  sildenafil (VIAGRA) 50 MG tablet Take 25 mg by mouth daily as needed for erectile dysfunction. Take 1 hour prior to sexual activity ( do not exceed 1 dose per 24 hour period)    [provider]  tamsulosin (FLOMAX) 0.4 MG CAPS capsule Take 0.4 mg by mouth daily.    [provider]    Family History Family History  Problem Relation Age of Onset  . Heart attack Father 71       cause of death  . Prostate cancer Father   . Cancer Sister   . Heart disease Sister     Social History Social History  Substance Use Topics  . Smoking status: Former Smoker    Types: Cigars, Pipe  . Smokeless tobacco: Never Used  . Alcohol use 0.0 oz/week     Comment: rarely     Allergies   Patient has no known allergies.   Review of Systems Review of Systems  Constitutional: Negative for fever.  Eyes: Positive for visual disturbance.  Respiratory: Negative for shortness of breath.   Cardiovascular: Negative for chest pain.  Gastrointestinal: Positive for nausea and vomiting. Negative for abdominal pain.  Neurological: Positive for headaches.       Somnolence  All other systems reviewed and are negative.    Physical Exam Updated Vital Signs BP (!) 129/54 (BP Location: Right Arm)   Pulse 72   Temp 98.4 F (36.9 C) (Oral)   Resp 18   SpO2 98%   Physical Exam  Constitutional: He is oriented to person, place, and time.  Elderly, sleepy but arousable  HENT:  Head: Normocephalic and atraumatic.  Pupils 3 mm and reactive bilaterally  Eyes: Pupils are equal, round, and reactive to light.  Cardiovascular: Normal rate, regular rhythm and normal heart sounds.   No murmur heard. Pulmonary/Chest: Effort normal and breath sounds normal. No respiratory distress. He has no wheezes.  Abdominal: Soft. There is no tenderness.  Musculoskeletal:  He exhibits no edema.  Neurological: He is oriented to person, place, and time.  Somnolent, oriented 3, cranial nerves II through XII intact, visual fields difficult to assess as patient does not follow the directions appropriately, blink to threat does appear intact bilaterally, no drift, no dysmetria to finger-nose-finger; however, patient does tend to turn his head to the left for visualization, 5 out of 5 strength in all 4 extremities  Skin: Skin is warm and dry.  Psychiatric: He has a normal mood and affect.  Nursing note and vitals reviewed.    ED Treatments / Results  Labs (all labs ordered are listed, but only abnormal results are displayed) Labs Reviewed  COMPREHENSIVE METABOLIC PANEL - Abnormal; Notable for the following:  Result Value   Glucose, Bld 113 (*)    Total Bilirubin 1.5 (*)    All other components within normal limits  CBC  AMMONIA  TROPONIN I  PROTIME-INR  CBG MONITORING, ED  TYPE AND SCREEN    EKG  EKG Interpretation  Date/Time:  Wednesday June 24 2017 00:24:53 EDT Ventricular Rate:  53 PR Interval:    QRS Duration: 118 QT Interval:  432 QTC Calculation: 406 R Axis:   -44 Text Interpretation:  Sinus rhythm Left anterior fascicular block Left ventricular hypertrophy Baseline wander in lead(s) V3 V4 V5 Confirmed by Ross Marcus ([id number]) on 06/24/2017 12:59:38 AM       Radiology Ct Head Wo Contrast  Result Date: 06/24/2017 CLINICAL DATA:  Altered level of consciousness. EXAM: CT HEAD WITHOUT CONTRAST TECHNIQUE: Contiguous axial images were obtained from the base of the skull through the vertex without intravenous contrast. COMPARISON:  None. FINDINGS: Brain: Large right temporal parenchymal hematoma extending into and filling the right lateral ventricle. Intraparenchymal component measures 3.5 x 6.7 x 3.1 cm (volume = 38 cm^3) with mild surrounding edema. Blood tracks into the third and fourth ventricle, with minimal dependent hemorrhage in  the occipital horn of the left lateral ventricle. No discrete hydrocephalus. The basilar cisterns remain patent. Hemorrhage crosses the midline without discrete midline shift. No subdural blood. Moderate generalized atrophy. Chronic small vessel ischemia. Vascular: Atherosclerosis of skullbase vasculature without hyperdense vessel or abnormal calcification. Skull: No skull fracture. Sinuses/Orbits: Paranasal sinuses and mastoid air cells are clear. The visualized orbits are unremarkable. Other: None. IMPRESSION: Large right temporal intraparenchymal hemorrhage with intraventricular extension, mild surrounding edema. Hemorrhage fills the right lateral ventricle and tracks into the third, fourth, and left lateral ventricles. The basilar cisterns remain patent, there is no hydrocephalus. Critical Value/emergent results were called by telephone at the time of interpretation on 06/24/2017 at 12:25 am to Dr. Ross Marcus , who verbally acknowledged these results. Electronically Signed   By: Rubye Oaks M.D.   On: 06/24/2017 00:26    Procedures Procedures (including critical care time)  CRITICAL CARE Performed by: Shon Baton   Total critical care time: 60 minutes  Critical care time was exclusive of separately billable procedures and treating other patients.  Critical care was necessary to treat or prevent imminent or life-threatening deterioration.  Critical care was time spent personally by me on the following activities: development of treatment plan with patient and/or surrogate as well as nursing, discussions with consultants, evaluation of patient's response to treatment, examination of patient, obtaining history from patient or surrogate, ordering and performing treatments and interventions, ordering and review of laboratory studies, ordering and review of radiographic studies, pulse oximetry and re-evaluation of patient's condition.    Medications Ordered in ED Medications    prothrombin complex conc human (KCENTRA) IVPB 4,412 Units (not administered)     Initial Impression / Assessment and Plan / ED Course  I have reviewed the triage vital signs and the nursing notes.  Pertinent labs & imaging results that were available during my care of the patient were reviewed by me and considered in my medical decision making (see chart for details).   Patient presents with somnolence and possible vision changes. Also reports headache. He is sleepy on exam but arousable and oriented. Family reports he's been very inconsistent with answering questions. Vital signs largely reassuring. Neuro exam is mostly intact.  Visual fields are difficult to assess but he does seem to favor his right visual field.  Initial lab work reviewed from triage and reassuring. CT head obtained as well as urinalysis.  12:30 AM Received call from radiology regarding CT results. Patient with large right-sided intracranial hemorrhage.  His exam is stable. I have had extensive discussion with the family.  He does not have any clear. Status but they do not think that he would want to be on long-term life support. I discussed with them that CPR is not compatible with this wish.  At this time his airway is intact but he may require intubation. Family is discussing options. Neurology consulted. Patient is on Eliquis.  He was given [facility]. Suspect his bleeding has been ongoing almost 24 hours at this point. Blood pressure appears under control.    Final Clinical Impressions(s) / ED Diagnoses   Final diagnoses:  Intracranial hemorrhage (HCC)  Somnolence    New Prescriptions New Prescriptions   No medications on file     Shon Baton, MD 06/24/17 [phone]

## 2017-06-24 NOTE — Progress Notes (Signed)
Pt sent to short stay for procedure.

## 2017-06-24 NOTE — Anesthesia Procedure Notes (Signed)
Arterial Line Insertion Start/End8/22/2018 6:15 AM, 06/24/2017 6:20 AM Performed by: Noreene Larsson Tunisha Ruland, anesthesiologist  Patient location: OR. Preanesthetic checklist: patient identified, IV checked, site marked, risks and benefits discussed, surgical consent, monitors and equipment checked, pre-op evaluation and timeout performed Left, radial was placed Catheter size: 20 G  Attempts: 2 Procedure performed using ultrasound guided technique. Ultrasound Notes:anatomy identified, needle tip was noted to be adjacent to the nerve/plexus identified and no ultrasound evidence of intravascular and/or intraneural injection Following insertion, dressing applied and Biopatch.

## 2017-06-24 NOTE — ED Notes (Signed)
Pt taken to CT.

## 2017-06-24 NOTE — Transfer of Care (Signed)
Immediate Anesthesia Transfer of Care Note  Patient: Randy Durham  Procedure(s) Performed: Procedure(s): CRANIOTOMY HEMATOMA EVACUATION SUBDURAL (Right)  Patient Location: ICU  Anesthesia Type:General  Level of Consciousness: Patient remains intubated per anesthesia plan  Airway & Oxygen Therapy: Patient remains intubated per anesthesia plan and Patient placed on Ventilator (see vital sign flow sheet for setting)  Post-op Assessment: Report given to RN  Post vital signs: Reviewed and stable  Last Vitals:  Vitals:   06/24/17 0400 06/24/17 0430  BP: (!) 129/94 (!) 126/58  Pulse: (!) 58   Resp: 16   Temp:    SpO2: 98%     Last Pain:  Vitals:   06/23/17 1805  TempSrc: Oral         Complications: No apparent anesthesia complications

## 2017-06-24 NOTE — ED Notes (Signed)
Pt attempted to use urinal without success.

## 2017-06-24 NOTE — Consult Note (Signed)
Admission H&P    Chief Complaint: Somnolence and headache  HPI: Randy Durham is an 83 y.o. male who presented to the ED with somnolence and headache since Tuesday morning. He woke up with the headache at 3 AM and vomited once. Also with nausea, diarrhea and chills.  At 7:15 AM he woke up his wife and told her that he had headache pain behind his right eye. He appeared lethargic and confused. Later, he began exhibiting worsened confusion, repeating questions and being unable to log on to a computer. He also seemed to have difficulty with vision on his left. He appeared to be very sleepy per his wife. Of note, for the past few weeks he has been falling asleep at the table. He is on oral anticoagulation with Eliquis for MI prevention per family.   On arrival to the ED, he could not follow commands appropriately, stated that the year was 3 and that he was in [place] or [place].   CT in the ED revealed a large right temporal intraparenchymal hemorrhage with intraventricular extension and mild surrounding edema. Hemorrhage fills the right lateral ventricle and tracks into the third, fourth, and left lateral ventricles. The basilar cisterns remain patent, there is no hydrocephalus.  His PMHx includes CAD, CKD, HLD, HTN, hypothyroidism and spinal stenosis. He has no prior history of stroke.   ICH score: 4   Past Medical History:  Diagnosis Date  . Anemia   . CAD (coronary artery disease)    Cath 03/26/2015 100% distal LAD stenosis, 80% ostial D1 stenosis, 75% ramus stenosis, 60% RPDA stenosis, 30% proximal RCA stenosis. Medical therapy, outpt myoview to assess LCx lesion  . Chronic kidney disease (CKD), stage III (moderate)   . Hyperlipidemia   . Hypertension   . Hypothyroidism   . Obesity (BMI 30-39.9)   . Spinal stenosis   . Thyroid nodule     Past Surgical History:  Procedure Laterality Date  . CARDIAC CATHETERIZATION N/A 03/26/2015   Procedure: Left Heart Cath and Coronary Angiography;   Surgeon: Lorretta Harp, MD;  Location: [facility];  Service: Cardiovascular;  Laterality: N/A;  . PILONIDAL CYST EXCISION    . TONSILLECTOMY      Family History  Problem Relation Age of Onset  . Heart attack Father 31       cause of death  . Prostate cancer Father   . Cancer Sister   . Heart disease Sister    Social History:  reports that he has quit smoking. His smoking use included Cigars and Pipe. He has never used smokeless tobacco. He reports that he drinks alcohol. He reports that he does not use drugs.  Allergies: No Known Allergies  Home Medications: Eliquis 5 mg BID ASA 81 mg qd Tylenol Lipitor Flonase Levothyroxine Lisinopril Gold Bond EX Lopressor MVI Nitrostat Omeprazole Viagra Flomax   ROS: Has mild frontal headache behind his right eye. No recent fever, SOB, abdominal pain or CP.   Physical Examination: Blood pressure (!) 129/54, pulse 72, temperature 98.4 F (36.9 C), temperature source Oral, resp. rate 18, SpO2 98 %.  HEENT-  [place]/AT. Neck supple. Lungs - Respirations unlabored Extremities - No edema. Warm and well perfused  Neurologic Examination: Mental Status: Drowsy to somnolent. Poor orientation. Limited speech output is fluent, with mild dysarthria. Able to follow some simple commands. GCS: 12 Cranial Nerves:  II:  Decreased blink to threat on the left. PERRL.  III,IV, VI: Able to gaze to left and right with  hesitation on leftward gaze V,VII: smile symmetric, facial light touch sensation intact bilaterally but with extinction on the right. Temp grossly intact bilaterally.  VIII: hearing intact to voice IX,X: no hypophonia XI: Head at midline XII: midline tongue extension  Motor: Right : Upper extremity   5/5    Left:     Upper extremity   5/5  Lower extremity   5/5     Lower extremity   4+/5 Normal tone throughout; no atrophy noted Sensory: Light touch intact x 4 when limbs are tested individually. Extinction to LUE and LLE.   Deep Tendon Reflexes:  1+ brachioradialis and biceps bilaterally. 1+ patellae, 0 achilles bilaterally.  Plantars: Right: downgoing  Left: downgoing Cerebellar: No ataxia with FNF bilaterally Gait: Deferred due to falls risk concerns  Results for orders placed or performed during the hospital encounter of 06/23/17 (from the past 48 hour(s))  Comprehensive metabolic panel     Status: Abnormal   Collection Time: 06/23/17  6:29 PM  Result Value Ref Range   Sodium 138 135 - 145 mmol/L   Potassium 4.3 3.5 - 5.1 mmol/L   Chloride 105 101 - 111 mmol/L   CO2 22 22 - 32 mmol/L   Glucose, Bld 113 (H) 65 - 99 mg/dL   BUN 14 6 - 20 mg/dL   Creatinine, Ser 1.03 0.61 - 1.24 mg/dL   Calcium 9.6 8.9 - 10.3 mg/dL   Total Protein 7.2 6.5 - 8.1 g/dL   Albumin 4.3 3.5 - 5.0 g/dL   AST 41 15 - 41 U/L   ALT 33 17 - 63 U/L   Alkaline Phosphatase 58 38 - 126 U/L   Total Bilirubin 1.5 (H) 0.3 - 1.2 mg/dL   GFR calc non Af Amer >60 >60 mL/min   GFR calc Af Amer >60 >60 mL/min    Comment: (NOTE) The eGFR has been calculated using the CKD EPI equation. This calculation has not been validated in all clinical situations. eGFR's persistently <60 mL/min signify possible Chronic Kidney Disease.    Anion gap 11 5 - 15  CBC     Status: None   Collection Time: 06/23/17  6:29 PM  Result Value Ref Range   WBC 10.5 4.0 - 10.5 K/uL   RBC 5.34 4.22 - 5.81 MIL/uL   Hemoglobin 15.7 13.0 - 17.0 g/dL   HCT 45.2 39.0 - 52.0 %   MCV 84.6 78.0 - 100.0 fL   MCH 29.4 26.0 - 34.0 pg   MCHC 34.7 30.0 - 36.0 g/dL   RDW 13.2 11.5 - 15.5 %   Platelets 214 150 - 400 K/uL  Ammonia     Status: None   Collection Time: 06/24/17 12:30 AM  Result Value Ref Range   Ammonia 12 9 - 35 umol/L  Troponin I     Status: None   Collection Time: 06/24/17 12:30 AM  Result Value Ref Range   Troponin I <0.03 <0.03 ng/mL  Protime-INR     Status: None   Collection Time: 06/24/17 12:37 AM  Result Value Ref Range   Prothrombin Time 13.6  11.4 - 15.2 seconds   INR 1.04    Ct Head Wo Contrast  Result Date: 06/24/2017 CLINICAL DATA:  Altered level of consciousness. EXAM: CT HEAD WITHOUT CONTRAST TECHNIQUE: Contiguous axial images were obtained from the base of the skull through the vertex without intravenous contrast. COMPARISON:  None. FINDINGS: Brain: Large right temporal parenchymal hematoma extending into and filling the right lateral ventricle. Intraparenchymal  component measures 3.5 x 6.7 x 3.1 cm (volume = 38 cm^3) with mild surrounding edema. Blood tracks into the third and fourth ventricle, with minimal dependent hemorrhage in the occipital horn of the left lateral ventricle. No discrete hydrocephalus. The basilar cisterns remain patent. Hemorrhage crosses the midline without discrete midline shift. No subdural blood. Moderate generalized atrophy. Chronic small vessel ischemia. Vascular: Atherosclerosis of skullbase vasculature without hyperdense vessel or abnormal calcification. Skull: No skull fracture. Sinuses/Orbits: Paranasal sinuses and mastoid air cells are clear. The visualized orbits are unremarkable. Other: None. IMPRESSION: Large right temporal intraparenchymal hemorrhage with intraventricular extension, mild surrounding edema. Hemorrhage fills the right lateral ventricle and tracks into the third, fourth, and left lateral ventricles. The basilar cisterns remain patent, there is no hydrocephalus. Critical Value/emergent results were called by telephone at the time of interpretation on 06/24/2017 at 12:25 am to Dr. Thayer Jew , who verbally acknowledged these results. Electronically Signed   By: Jeb Levering M.D.   On: 06/24/2017 00:26    Assessment: 83 y.o. male on anticoagulation presents with acute right temporal lobe hemorrhage with intraventricular extension 1. CT in the ED revealed a large right temporal intraparenchymal hemorrhage with intraventricular extension and mild surrounding edema. Hemorrhage fills the  right lateral ventricle and tracks into the third, fourth, and left lateral ventricles. Intraparenchymal hematoma volume is 38 cc. The basilar cisterns remain patent, there is no hydrocephalus.  2. On Eliquis for prevention of MI, per family. No listed diagnosis of PE, DVT or a-fib in EPIC.  3. History of MI.  4.  PMHx includes CAD, CKD, HLD, HTN, hypothyroidism and spinal stenosis.  Plan: 1. MRI brain to evaluate for underlying lesion 2. CT head in 12 hours to assess for stability 3. Currently no indication for hypertonic saline. Will reassess following repeat CT head 4. Cardiac telemetry 5. PT consult, OT consult, Speech consult 6. Stop ASA and Eliquis 7. Wandalee Ferdinand has been administered 8. BP management as per post-ICH protocol 9. Continue levothyroxine. Obtain TSH level.  10. Continue Lipitor 11. Neurosurgery has been called by ED attending. Will need to have Neurosurgery on board in the event that patient needs a ventriculostomy. 22. Family desires patient to be full code 10. Admitting patient to ICU under Neurology service 14. Frequent neuro checks  40 minutes spent in the emergent Neurological evaluation and management of this critically ill [facility] patient  Electronically signed: Dr. Kerney Elbe 06/24/2017, 1:24 AM

## 2017-06-24 NOTE — Procedures (Signed)
Extubation Procedure Note  Patient Details:   Name: Randy Durham DOB: 23-May-1934 MRN: [id number]   Airway Documentation:  Airway 7.5 mm (Active)  Secured at (cm) 22 cm 06/24/2017  8:20 AM  Measured From Lips 06/24/2017  8:20 AM  Secured Location Right 06/24/2017  9:00 AM  Secured By Pink Tape 06/24/2017  9:00 AM  Site Condition Dry 06/24/2017  9:00 AM    Evaluation  O2 sats: currently acceptable Complications: No apparent complications Patient did tolerate procedure well. Bilateral Breath Sounds: Diminished, Clear   Yes  Extubated patient to 4L nasal canula. Patient orient to time and place.  Morley Kos 06/24/2017, 12:37 PM

## 2017-06-24 NOTE — Progress Notes (Signed)
eLink Physician-Brief Progress Note Patient Name: Dunte Blinn DOB: [birth year] MRN: [id number]   Date of Service  06/24/2017  HPI/Events of Note  SB with Normal BP  eICU Interventions  Nothing to do at this time     Intervention Category Evaluation Type: Other  Erin Fulling 06/24/2017, 6:25 PM

## 2017-06-24 NOTE — Progress Notes (Signed)
STROKE TEAM PROGRESS NOTE   HISTORY OF PRESENT ILLNESS (per record) Randy Durham is an 83 y.o. male with a history of CAD, CKD, HLD, HTN, hypothyroidism and spinal stenosis who presented to the ED with somnolence and headache since Tuesday morning. He woke up with the headache at 3 AM and vomited once. Also with nausea, diarrhea and chills.  At 7:15 AM he woke up his wife and told her that he had headache pain behind his right eye. He appeared lethargic and confused. Later, he began exhibiting worsened confusion, repeating questions and being unable to log on to a computer. He also seemed to have difficulty with vision on his left. He appeared to be very sleepy per his wife. Of note, for the past few weeks he has been falling asleep at the table. He is on oral anticoagulation with Eliquis for MI prevention per family.  On arrival to the ED, he could not follow commands appropriately, stated that the year was 11 and that he was in [place] or [place]. CT in the ED revealed a large right temporal intraparenchymal hemorrhage with intraventricular extension and mild surrounding edema. Hemorrhage fills the right lateral ventricle and tracks into the third, fourth, and left lateral ventricles. The basilar cisterns remain patent, there is no hydrocephalus.  Anticoagulation reversed with 4,412 units PCC.  Now s/p craniectomy and hematoma evacuation, no EVD.  Na 137.  ICH score: 4  Patient was not administered IV t-PA secondary to ICH. He was admitted to the neuro ICU for further evaluation and treatment.Patient gets agitated and restless when sedation is turned off   SUBJECTIVE (INTERVAL HISTORY) His wife is at the bedside.  The patient is intubated and on propofol for sedation.  He returned from the OR this morning   OBJECTIVE Temp:  [97.6 F (36.4 C)-99.5 F (37.5 C)] 99.5 F (37.5 C) (08/22 1600) Pulse Rate:  [42-87] 54 (08/22 1700) Cardiac Rhythm: Normal sinus rhythm (08/22 1130) Resp:  [11-25] 16  (08/22 1700) BP: (103-215)/(34-113) 123/93 (08/22 1700) SpO2:  [95 %-100 %] 99 % (08/22 1700) Arterial Line BP: (67-142)/(54-124) 84/65 (08/22 1700) FiO2 (%):  [0 %-100 %] 40 % (08/22 1204) Weight:  [81.2 kg (179 lb 0.2 oz)] 81.2 kg (179 lb 0.2 oz) (08/22 0300)  CBC:   Recent Labs Lab 06/23/17 1829 06/24/17 0930  WBC 10.5 9.1  HGB 15.7 13.3  HCT 45.2 39.6  MCV 84.6 85.3  PLT 214 205    Basic Metabolic Panel:   Recent Labs Lab 06/23/17 1829 06/24/17 0930  NA 138 137  K 4.3 4.1  CL 105 104  CO2 22 22  GLUCOSE 113* 103*  BUN 14 15  CREATININE 1.03 1.07  CALCIUM 9.6 9.4  MG  --  1.9  PHOS  --  3.6    Lipid Panel:     Component Value Date/Time   CHOL 131 05/10/2015 1039   TRIG 101 06/24/2017 0930   HDL 45.00 05/10/2015 1039   CHOLHDL 3 05/10/2015 1039   VLDL 16.0 05/10/2015 1039   LDLCALC 70 05/10/2015 1039   HgbA1c: No results found for: HGBA1C Urine Drug Screen: No results found for: LABOPIA, COCAINSCRNUR, LABBENZ, AMPHETMU, THCU, LABBARB  Alcohol Level No results found for: ETH  IMAGING  Ct Angio Head W Or Wo Contrast Ct Angio Neck W Or Wo Contrast 06/24/2017   IMPRESSION: 1. Unchanged volume of intraparenchymal hemorrhage in the right temporal lobe. Unchanged intraventricular extension and ventricular configuration. 2. No emergent large vessel occlusion.  Poor visualization of the origin of the right posterior cerebral artery, which probably arises from a small right posterior communicating artery. The remainder of the visible PCA is normal. 3. No vascular abnormality to account for the intraparenchymal hemorrhage. In a patient of this age, lobar parenchymal hemorrhage may be secondary to underlying amyloid angiopathy for hypertension. 4. Carotid bifurcation and Aortic Atherosclerosis (ICD10-I70.0) without hemodynamically significant stenosis.  Ct Head Wo Contrast 06/24/2017 IMPRESSION: Large right temporal intraparenchymal hemorrhage with intraventricular  extension, mild surrounding edema. Hemorrhage fills the right lateral ventricle and tracks into the third, fourth, and left lateral ventricles. The basilar cisterns remain patent, there is no hydrocephalus.    Randy Durham Wo Contrast 06/24/2017 IMPRESSION: 1. Postsurgical changes from interval right temporal hematoma evacuation. 2. Small volume acute ischemia just posterior to the surgical cavity. 3. Decreased intraventricular blood products. 4. Small right and tiny left subdural fluid collections without significant mass effect. 5. Scattered bilateral sulcal FLAIR signal abnormality which may reflect a combination of small volume subarachnoid blood products and artifact.  Dg Chest Port 1 View 06/24/2017 IMPRESSION: 1. Left jugular catheter as above, likely in the left brachiocephalic vein though clinical or blood gas correlation is recommended to exclude arterial placement. 2. Minimal left basilar atelectasis.    PHYSICAL EXAM Elderly obese Caucasian male who is currently is sedated and intubated. . Afebrile. Head is nontraumatic. Neck is supple without bruit.    Cardiac exam no murmur or gallop. Lungs are clear to auscultation. Distal pulses are well felt. Neurological Exam ;  sedated intubated. Eyes open partially to sternal rub. Able to move all 4 extremity is against gravity but left side less than the right. Pupils small irregular but reactive. Fundi could not be visualized. Mild left lower facial weakness. Tongue midline. Tone appears to be normal bilaterally. Both plantars are downgoing.  ASSESSMENT/PLAN Randy. Cong Hightower is a 83 y.o. male with history of CAD, CKD, HLD, HTN, hypothyroidism and spinal stenosis who presented to the ED with somnolence and headache since Tuesday morning.  He did not receive IV t-PA due to ICH.   Stroke: Large R temporal IPH with extension to R lateral, 3rd, and 4th ventricle with  , mild associated cytotoxic edema, and no hydrocephalus, in the setting of  moderate hypertension and anticoagulation with Eliquis and aspirin, and possible amyloid angiopathy. Status post craniotomy for hematoma evacuation on 06/24/17  Resultant  left hemiparesis  CT head: Large right temporal intraparenchymal hemorrhage with intraventricular extension, mild surrounding edema. Hemorrhage fills the right lateral ventricle and tracks into the third, fourth, and left lateral ventricles.  MRI head (POD#0). Small volume acute ischemia posterior to surgical cavity, small right and tiny left subdural fluid collections without significant mass effect, small volume subarachnoid blood products versus artifact.  MRA head: not ordered  CTA head/neck: No vascular abnormality to account for the intraparenchymal hemorrhage  2D Echo: not ordered   LDL 70  HgbA1c pending   SCDs for VTE prophylaxis Diet NPO time specified  aspirin 81 mg daily and Eliquis (apixaban) daily prior to admission, now on No antithrombotic  Patient counseled to be compliant with his antithrombotic medications  Ongoing aggressive stroke risk factor management  Therapy recommendations:   pending  Disposition:   pending  Hypertension  Unstable  Improved with increased propofol  PRN hydralazine, avoid labetalol due to bradycardia  Goal SBP < 140 for first 24-hours post-hemorrhage  Long-term BP goal normotensive  Hyperlipidemia  Home meds: atorvastatin 80 mg PO  daily  LDL 70, goal < 70  Resume statin once ICH is stable  Continue statin at discharge  Other Stroke Risk Factors  Advanced age  ETOH use, advised to drink no more than 2 drink(s) a day  Obesity, Body mass index is 31.71 kg/m., recommend weight loss, diet and exercise as appropriate   Other Active Problems  None  Hospital day # 0 I have personally examined this patient, reviewed notes, independently viewed imaging studies, participated in medical decision making and plan of care.ROS completed by me personally and  pertinent positives fully documented  I have made any additions or clarifications directly to the above note. The patient presented with a moderate size right temporal parenchymal hemorrhage but with intraventricular extension and was at significant risk for worsening cytotoxic edema and hydrocephalus and underwent emergent right temporal craniotomy for hematoma evacuation. Plan extubate later today if tolerated. Continue strict control of blood pressure with systolic blood pressure goal less than 140 for 24 hours. Close neurological monitoring. Discussed with Dr.Byrum and patient`s wife and  answered questions. Patient remains at risk for embolic strokes due to atrial fibrillation but clearly is not a candidate for anticoagulation given his brain hemorrhage. May consider restarting aspirin in 1-2 weeks This patient is critically ill and at significant risk of neurological worsening, death and care requires constant monitoring of vital signs, hemodynamics,respiratory and cardiac monitoring, extensive review of multiple databases, frequent neurological assessment, discussion with family, other specialists and medical decision making of high complexity.I have made any additions or clarifications directly to the above note.This critical care time does not reflect procedure time, or teaching time or supervisory time of PA/NP/Med Resident etc but could involve care discussion time.  I spent 40 minutes of neurocritical care time  in the care of  this patient.     Delia Heady, MD Medical Director [facility] Stroke Center Pager: [phone] 06/24/2017 6:03 PM   To contact Stroke Continuity provider, please refer to [email or web address]. After hours, contact General Neurology

## 2017-06-24 NOTE — Anesthesia Procedure Notes (Signed)
Central Venous Catheter Insertion Performed by: Kipp Brood, anesthesiologist Start/End8/22/2018 6:00 AM, 06/24/2017 6:10 AM Patient location: OR. Preanesthetic checklist: patient identified, IV checked, site marked, risks and benefits discussed, surgical consent, monitors and equipment checked, pre-op evaluation, timeout performed and anesthesia consent Position: supine Lidocaine 1% used for infiltration and patient sedated Hand hygiene performed , maximum sterile barriers used  and Seldinger technique used Catheter size: 8 Fr Total catheter length 16. Central line was placed.Double lumen Procedure performed without using ultrasound guided technique. Attempts: 1 Following insertion, dressing applied and line sutured. Post procedure assessment: blood return through all ports  Patient tolerated the procedure well with no immediate complications.

## 2017-06-24 NOTE — Consult Note (Signed)
Reason for Consult:Intracerebral, intraventricular hemorrhage Referring Physician: Eshawn, Coor is an 83 y.o. male.  HPI: whom was in his usual state of health until Tuesday morning when he awoke with a headache, nausea, and emesis. He throughout the course of Tuesday complained of a headache behind his right eye. He was apparently lethargic and slightly confused. He would repeat questions, and had a visual deficit on the left side.Brought to the ED, he was unable to follow commands appropriately. Head CT revealed a large right temporal lobe hemorrhage with intraventricular extension. He is admitted for treatment of the [place], and I am consulted for the [place] and IVH.   Past Medical History:  Diagnosis Date  . Anemia   . CAD (coronary artery disease)    Cath 03/26/2015 100% distal LAD stenosis, 80% ostial D1 stenosis, 75% ramus stenosis, 60% RPDA stenosis, 30% proximal RCA stenosis. Medical therapy, outpt myoview to assess LCx lesion  . Chronic kidney disease (CKD), stage III (moderate)   . Hyperlipidemia   . Hypertension   . Hypothyroidism   . Obesity (BMI 30-39.9)   . Spinal stenosis   . Thyroid nodule     Past Surgical History:  Procedure Laterality Date  . CARDIAC CATHETERIZATION N/A 03/26/2015   Procedure: Left Heart Cath and Coronary Angiography;  Surgeon: Lorretta Harp, MD;  Location: [facility];  Service: Cardiovascular;  Laterality: N/A;  . PILONIDAL CYST EXCISION    . TONSILLECTOMY      Family History  Problem Relation Age of Onset  . Heart attack Father 34       cause of death  . Prostate cancer Father   . Cancer Sister   . Heart disease Sister     Social History:  reports that he has quit smoking. His smoking use included Cigars and Pipe. He has never used smokeless tobacco. He reports that he drinks alcohol. He reports that he does not use drugs.  Allergies: No Known Allergies  Medications: I have reviewed the patient's current  medications.  Results for orders placed or performed during the hospital encounter of 06/23/17 (from the past 48 hour(s))  Comprehensive metabolic panel     Status: Abnormal   Collection Time: 06/23/17  6:29 PM  Result Value Ref Range   Sodium 138 135 - 145 mmol/L   Potassium 4.3 3.5 - 5.1 mmol/L   Chloride 105 101 - 111 mmol/L   CO2 22 22 - 32 mmol/L   Glucose, Bld 113 (H) 65 - 99 mg/dL   BUN 14 6 - 20 mg/dL   Creatinine, Ser 1.03 0.61 - 1.24 mg/dL   Calcium 9.6 8.9 - 10.3 mg/dL   Total Protein 7.2 6.5 - 8.1 g/dL   Albumin 4.3 3.5 - 5.0 g/dL   AST 41 15 - 41 U/L   ALT 33 17 - 63 U/L   Alkaline Phosphatase 58 38 - 126 U/L   Total Bilirubin 1.5 (H) 0.3 - 1.2 mg/dL   GFR calc non Af Amer >60 >60 mL/min   GFR calc Af Amer >60 >60 mL/min    Comment: (NOTE) The eGFR has been calculated using the CKD EPI equation. This calculation has not been validated in all clinical situations. eGFR's persistently <60 mL/min signify possible Chronic Kidney Disease.    Anion gap 11 5 - 15  CBC     Status: None   Collection Time: 06/23/17  6:29 PM  Result Value Ref Range   WBC 10.5 4.0 -  10.5 K/uL   RBC 5.34 4.22 - 5.81 MIL/uL   Hemoglobin 15.7 13.0 - 17.0 g/dL   HCT 45.2 39.0 - 52.0 %   MCV 84.6 78.0 - 100.0 fL   MCH 29.4 26.0 - 34.0 pg   MCHC 34.7 30.0 - 36.0 g/dL   RDW 13.2 11.5 - 15.5 %   Platelets 214 150 - 400 K/uL  Ammonia     Status: None   Collection Time: 06/24/17 12:30 AM  Result Value Ref Range   Ammonia 12 9 - 35 umol/L  Troponin I     Status: None   Collection Time: 06/24/17 12:30 AM  Result Value Ref Range   Troponin I <0.03 <0.03 ng/mL  Protime-INR     Status: None   Collection Time: 06/24/17 12:37 AM  Result Value Ref Range   Prothrombin Time 13.6 11.4 - 15.2 seconds   INR 1.04   Type and screen [place]     Status: None (Preliminary result)   Collection Time: 06/24/17 12:55 AM  Result Value Ref Range   ABO/RH(D) A POS    Antibody Screen  PENDING    Sample Expiration 06/27/2017   ABO/Rh     Status: None (Preliminary result)   Collection Time: 06/24/17 12:55 AM  Result Value Ref Range   ABO/RH(D) A POS     Ct Head Wo Contrast  Result Date: 06/24/2017 CLINICAL DATA:  Altered level of consciousness. EXAM: CT HEAD WITHOUT CONTRAST TECHNIQUE: Contiguous axial images were obtained from the base of the skull through the vertex without intravenous contrast. COMPARISON:  None. FINDINGS: Brain: Large right temporal parenchymal hematoma extending into and filling the right lateral ventricle. Intraparenchymal component measures 3.5 x 6.7 x 3.1 cm (volume = 38 cm^3) with mild surrounding edema. Blood tracks into the third and fourth ventricle, with minimal dependent hemorrhage in the occipital horn of the left lateral ventricle. No discrete hydrocephalus. The basilar cisterns remain patent. Hemorrhage crosses the midline without discrete midline shift. No subdural blood. Moderate generalized atrophy. Chronic small vessel ischemia. Vascular: Atherosclerosis of skullbase vasculature without hyperdense vessel or abnormal calcification. Skull: No skull fracture. Sinuses/Orbits: Paranasal sinuses and mastoid air cells are clear. The visualized orbits are unremarkable. Other: None. IMPRESSION: Large right temporal intraparenchymal hemorrhage with intraventricular extension, mild surrounding edema. Hemorrhage fills the right lateral ventricle and tracks into the third, fourth, and left lateral ventricles. The basilar cisterns remain patent, there is no hydrocephalus. Critical Value/emergent results were called by telephone at the time of interpretation on 06/24/2017 at 12:25 am to Dr. Thayer Jew , who verbally acknowledged these results. Electronically Signed   By: Jeb Levering M.D.   On: 06/24/2017 00:26    Review of Systems  Constitutional: Negative.   HENT: Negative.   Respiratory: Negative.   Cardiovascular: Negative.   Gastrointestinal:  Positive for nausea and vomiting.  Musculoskeletal: Negative.   Skin: Positive for itching.  Neurological: Positive for dizziness, speech change and headaches.  Endo/Heme/Allergies: Negative.   Psychiatric/Behavioral: Negative.    Blood pressure (!) 113/103, pulse 61, temperature 98.4 F (36.9 C), temperature source Oral, resp. rate 14, height 5' 3"  (1.6 m), weight 81.2 kg (179 lb 0.2 oz), SpO2 100 %. Physical Exam  Constitutional: He is oriented to person, place, and time. He appears well-developed and well-nourished. He appears distressed.  HENT:  Head: Normocephalic and atraumatic.  Right Ear: External ear normal.  Left Ear: External ear normal.  Eyes: Pupils are equal, round, and reactive  to light. Conjunctivae and EOM are normal. Right eye exhibits no discharge. Left eye exhibits no discharge.  Neck: Normal range of motion. Neck supple.  Cardiovascular: Normal rate and regular rhythm.   Respiratory: Effort normal and breath sounds normal.  GI: Soft. Bowel sounds are normal.  Musculoskeletal: Normal range of motion.  Neurological: He is alert and oriented to person, place, and time. He has normal strength and normal reflexes. No cranial nerve deficit or sensory deficit. Coordination normal. He displays no Babinski's sign on the right side. He displays no Babinski's sign on the left side.  Moving all extremities, no drift   Skin: Skin is warm and dry.    Assessment/Plan: 83 yo with large Right Temporal ICH with intraventricular extension. Blood is visible in the 4th ventricle.Though impressive his neurologic exam is now better than it was on presentation. He is however still symptomatic, with nausea and emesis. The blood is accessible and I do believe his recovery would be aided if the blood were removed. I have recommended a craniotomy for hematoma evacuation and possible ventricular catheter placement. Risks including but not limited too bleeding, infection, stroke, coma, death,  brain damage, paralysis, weakness, change in personality and other risks were discussed with his wife. She understands and agrees with the plan for the Craniotomy.  Whitleigh Garramone L 06/24/2017, 3:30 AM

## 2017-06-24 NOTE — Consult Note (Signed)
PULMONARY / CRITICAL CARE MEDICINE   Name: Randy Durham MRN: [id number] DOB: 22-May-1934    ADMISSION DATE:  06/23/2017 CONSULTATION DATE:  06/24/2017  REFERRING MD:  Dr. Pearlean Brownie   CHIEF COMPLAINT:  Large Right Temporal IPH   HISTORY OF PRESENT ILLNESS:   83 year old male with PMH of CAD, CKD, HLD, HTN, Hypothyroidism, Spinal Stenosis   Presents to ED on 8/21 with reported 12 hours of headache, nausea, and vomiting. CT of head revealed large right temporal IPH with IV extension and mild surrounding edema. Patient admitted to Neuro ICU. At 0200 while in ICU patient was taken for a STAT head CT for an acute neuro change in which revealed unchanged amount/size of IPH. Neurosurgery consulted and taken for craniotomy. Post-OR patient remained intubated. PCCM asked to consult.   PAST MEDICAL HISTORY :  He  has a past medical history of Anemia; CAD (coronary artery disease); Chronic kidney disease (CKD), stage III (moderate); Hyperlipidemia; Hypertension; Hypothyroidism; Obesity (BMI 30-39.9); Spinal stenosis; and Thyroid nodule.  PAST SURGICAL HISTORY: He  has a past surgical history that includes Tonsillectomy; Pilonidal cyst excision; and Cardiac catheterization (N/A, 03/26/2015).  No Known Allergies  No current facility-administered medications on file prior to encounter.    Current Outpatient Prescriptions on File Prior to Encounter  Medication Sig  . acetaminophen (TYLENOL) 325 MG tablet Take 325 mg by mouth every 6 (six) hours as needed (pain).  Marland Kitchen apixaban (ELIQUIS) 5 MG TABS tablet Take 1 tablet (5 mg total) by mouth 2 (two) times daily.  Marland Kitchen aspirin EC 81 MG tablet Take 81 mg by mouth daily.  Marland Kitchen atorvastatin (LIPITOR) 80 MG tablet Take 1 tablet (80 mg total) by mouth daily at 6 PM.  . fluticasone (FLONASE) 50 MCG/ACT nasal spray Place 1 spray into both nostrils daily as needed (seasonal allergies).  Marland Kitchen levothyroxine (SYNTHROID, LEVOTHROID) 112 MCG tablet Take 112 mcg by mouth daily before  breakfast.  . lisinopril (PRINIVIL,ZESTRIL) 10 MG tablet Take 1 tablet (10 mg total) by mouth at bedtime.  . Menthol-Zinc Oxide (GOLD BOND EX) Apply 1 application topically at bedtime.  . metoprolol tartrate (LOPRESSOR) 25 MG tablet Take 0.5 tablets (12.5 mg total) by mouth 2 (two) times daily.  . Multiple Vitamins-Minerals (CENTRUM SILVER PO) Take 1 tablet by mouth daily.  . nitroGLYCERIN (NITROSTAT) 0.4 MG SL tablet Place 1 tablet (0.4 mg total) under the tongue every 5 (five) minutes x 3 doses as needed for chest pain.  Marland Kitchen omeprazole (PRILOSEC) 20 MG capsule Take 20 mg by mouth daily.  . sildenafil (VIAGRA) 50 MG tablet Take 25 mg by mouth daily as needed for erectile dysfunction. Take 1 hour prior to sexual activity ( do not exceed 1 dose per 24 hour period)  . tamsulosin (FLOMAX) 0.4 MG CAPS capsule Take 0.4 mg by mouth daily.    FAMILY HISTORY:  His indicated that his mother is deceased. He indicated that his father is deceased. He indicated that both of his sisters are deceased. He indicated that his maternal grandmother is deceased. He indicated that his maternal grandfather is deceased. He indicated that his paternal grandmother is deceased. He indicated that his paternal grandfather is deceased.    SOCIAL HISTORY: He  reports that he has quit smoking. His smoking use included Cigars and Pipe. He has never used smokeless tobacco. He reports that he drinks alcohol. He reports that he does not use drugs.  REVIEW OF SYSTEMS:   Unable to review as patient is intubated  and sedated   SUBJECTIVE:   VITAL SIGNS: BP (!) 142/103 Comment: titrating propofol up to 40  Pulse (!) 50   Temp 97.6 F (36.4 C) (Oral)   Resp 17   Ht 5\' 3"  (1.6 m)   Wt 81.2 kg (179 lb 0.2 oz)   SpO2 100%   BMI 31.71 kg/m   HEMODYNAMICS:    VENTILATOR SETTINGS: Vent Mode: PRVC FiO2 (%):  [0 %-100 %] 100 % Set Rate:  [14 bmp] 14 bmp Vt Set:  [460 mL] 460 mL PEEP:  [5 cmH20] 5 cmH20  INTAKE /  OUTPUT: I/O last 3 completed shifts: In: -  Out: 200 [Urine:200]  PHYSICAL EXAMINATION: General:  Elderly male, no distress  Neuro:  Sedated HEENT:  ETT in place  Cardiovascular:  RRR, no MRG  Lungs:  Clear breath sounds, no crackles/wheeze  Abdomen:  Non-distended, active bowel sounds  Musculoskeletal:  -edema  Skin:  Warm, dry > post-op dressing noted to skull.   LABS:  BMET  Recent Labs Lab 06/23/17 1829  NA 138  K 4.3  CL 105  CO2 22  BUN 14  CREATININE 1.03  GLUCOSE 113*    Electrolytes  Recent Labs Lab 06/23/17 1829  CALCIUM 9.6    CBC  Recent Labs Lab 06/23/17 1829  WBC 10.5  HGB 15.7  HCT 45.2  PLT 214    Coag's  Recent Labs Lab 06/24/17 0037  INR 1.04    Sepsis Markers No results for input(s): LATICACIDVEN, PROCALCITON, O2SATVEN in the last 168 hours.  ABG No results for input(s): PHART, PCO2ART, PO2ART in the last 168 hours.  Liver Enzymes  Recent Labs Lab 06/23/17 1829  AST 41  ALT 33  ALKPHOS 58  BILITOT 1.5*  ALBUMIN 4.3    Cardiac Enzymes  Recent Labs Lab 06/24/17 0030  TROPONINI <0.03    Glucose No results for input(s): GLUCAP in the last 168 hours.  Imaging Ct Angio Head W Or Wo Contrast  Addendum Date: 06/24/2017   ADDENDUM REPORT: 06/24/2017 03:50 ADDENDUM: There is bilateral atherosclerotic calcification of the internal carotid arteries at the skullbase without hemodynamically significant stenosis. This was erroneously labeled as normal in the findings of the initial report. Electronically Signed   By: Deatra Robinson M.D.   On: 06/24/2017 03:50   Result Date: 06/24/2017 CLINICAL DATA:  Intracranial hemorrhage EXAM: CT ANGIOGRAPHY HEAD AND NECK TECHNIQUE: Multidetector CT imaging of the head and neck was performed using the standard protocol during bolus administration of intravenous contrast. Multiplanar CT image reconstructions and MIPs were obtained to evaluate the vascular anatomy. Carotid stenosis  measurements (when applicable) are obtained utilizing NASCET criteria, using the distal internal carotid diameter as the denominator. CONTRAST:  50 mL Isovue 370 COMPARISON:  Head CT 06/24/2017 FINDINGS: CTA NECK FINDINGS Aortic arch: There is no aneurysm or dissection of the visualized ascending aorta or aortic arch. Normal 3 vessel aortic branching pattern. The visualized proximal subclavian arteries are normal. Right carotid system: The right common carotid origin is widely patent. There is no common carotid or internal carotid artery dissection or aneurysm. Atherosclerotic calcification at the carotid bifurcation without hemodynamically significant stenosis. Left carotid system: The left common carotid origin is widely patent. There is no common carotid or internal carotid artery dissection or aneurysm. Atherosclerotic calcification at the carotid bifurcation without hemodynamically significant stenosis. Vertebral arteries: The vertebral system is left dominant. Both vertebral artery origins are normal. Both vertebral arteries are normal to their confluence with the basilar  artery. Skeleton: There is no bony spinal canal stenosis. No lytic or blastic lesions. Other neck: The nasopharynx is clear. The oropharynx and hypopharynx are normal. The epiglottis is normal. The supraglottic larynx, glottis and subglottic larynx are normal. No retropharyngeal collection. The parapharyngeal spaces are preserved. The parotid and submandibular glands are normal. No sialolithiasis or salivary ductal dilatation. The thyroid gland is normal. There is no cervical lymphadenopathy. Upper chest: No pneumothorax or pleural effusion. No nodules or masses. Review of the MIP images confirms the above findings CTA HEAD FINDINGS Anterior circulation: --Intracranial internal carotid arteries: Normal. --Anterior cerebral arteries: Azygos configuration of the anterior cerebral artery. --Middle cerebral arteries: Normal. --Posterior  communicating arteries: Present on the left. Not clearly seen on the right. Posterior circulation: --Posterior cerebral arteries: I cannot adequately visualize the origin of the right posterior cerebral artery. The P2 segment is normal. The left PCA arises from a fetal origin. --Superior cerebellar arteries: Normal. --Basilar artery: Normal. --Anterior inferior cerebellar arteries: Normal. --Posterior inferior cerebellar arteries: Normal. Venous sinuses: As permitted by contrast timing, patent. Anatomic variants: Azygos anterior cerebral artery. Fetal origin of the left posterior cerebral artery. Delayed phase: No parenchymal contrast enhancement. Intraparenchymal hematoma in the right temporal lobe measures 3.3 x 6.0 x 3.0 cm (volume = 31 cm^3), previously 5.4 x 3.5 x 3.1 cm (volume = 31 cm^3). Size of the ventricles are unchanged. The degree of intraventricular extension of blood is unchanged. Review of the MIP images confirms the above findings IMPRESSION: 1. Unchanged volume of intraparenchymal hemorrhage in the right temporal lobe. Unchanged intraventricular extension and ventricular configuration. 2. No emergent large vessel occlusion. Poor visualization of the origin of the right posterior cerebral artery, which probably arises from a small right posterior communicating artery. The remainder of the visible PCA is normal. 3. No vascular abnormality to account for the intraparenchymal hemorrhage. In a patient of this age, lobar parenchymal hemorrhage may be secondary to underlying amyloid angiopathy for hypertension. 4. Carotid bifurcation and Aortic Atherosclerosis (ICD10-I70.0) without hemodynamically significant stenosis. Electronically Signed: By: Deatra Robinson M.D. On: 06/24/2017 03:31   Ct Head Wo Contrast  Result Date: 06/24/2017 CLINICAL DATA:  Altered level of consciousness. EXAM: CT HEAD WITHOUT CONTRAST TECHNIQUE: Contiguous axial images were obtained from the base of the skull through the vertex  without intravenous contrast. COMPARISON:  None. FINDINGS: Brain: Large right temporal parenchymal hematoma extending into and filling the right lateral ventricle. Intraparenchymal component measures 3.5 x 6.7 x 3.1 cm (volume = 38 cm^3) with mild surrounding edema. Blood tracks into the third and fourth ventricle, with minimal dependent hemorrhage in the occipital horn of the left lateral ventricle. No discrete hydrocephalus. The basilar cisterns remain patent. Hemorrhage crosses the midline without discrete midline shift. No subdural blood. Moderate generalized atrophy. Chronic small vessel ischemia. Vascular: Atherosclerosis of skullbase vasculature without hyperdense vessel or abnormal calcification. Skull: No skull fracture. Sinuses/Orbits: Paranasal sinuses and mastoid air cells are clear. The visualized orbits are unremarkable. Other: None. IMPRESSION: Large right temporal intraparenchymal hemorrhage with intraventricular extension, mild surrounding edema. Hemorrhage fills the right lateral ventricle and tracks into the third, fourth, and left lateral ventricles. The basilar cisterns remain patent, there is no hydrocephalus. Critical Value/emergent results were called by telephone at the time of interpretation on 06/24/2017 at 12:25 am to Dr. Ross Marcus , who verbally acknowledged these results. Electronically Signed   By: Rubye Oaks M.D.   On: 06/24/2017 00:26   Ct Angio Neck W Or Wo Contrast  Addendum Date: 06/24/2017   ADDENDUM REPORT: 06/24/2017 03:50 ADDENDUM: There is bilateral atherosclerotic calcification of the internal carotid arteries at the skullbase without hemodynamically significant stenosis. This was erroneously labeled as normal in the findings of the initial report. Electronically Signed   By: Deatra Robinson M.D.   On: 06/24/2017 03:50   Result Date: 06/24/2017 CLINICAL DATA:  Intracranial hemorrhage EXAM: CT ANGIOGRAPHY HEAD AND NECK TECHNIQUE: Multidetector CT imaging of the  head and neck was performed using the standard protocol during bolus administration of intravenous contrast. Multiplanar CT image reconstructions and MIPs were obtained to evaluate the vascular anatomy. Carotid stenosis measurements (when applicable) are obtained utilizing NASCET criteria, using the distal internal carotid diameter as the denominator. CONTRAST:  50 mL Isovue 370 COMPARISON:  Head CT 06/24/2017 FINDINGS: CTA NECK FINDINGS Aortic arch: There is no aneurysm or dissection of the visualized ascending aorta or aortic arch. Normal 3 vessel aortic branching pattern. The visualized proximal subclavian arteries are normal. Right carotid system: The right common carotid origin is widely patent. There is no common carotid or internal carotid artery dissection or aneurysm. Atherosclerotic calcification at the carotid bifurcation without hemodynamically significant stenosis. Left carotid system: The left common carotid origin is widely patent. There is no common carotid or internal carotid artery dissection or aneurysm. Atherosclerotic calcification at the carotid bifurcation without hemodynamically significant stenosis. Vertebral arteries: The vertebral system is left dominant. Both vertebral artery origins are normal. Both vertebral arteries are normal to their confluence with the basilar artery. Skeleton: There is no bony spinal canal stenosis. No lytic or blastic lesions. Other neck: The nasopharynx is clear. The oropharynx and hypopharynx are normal. The epiglottis is normal. The supraglottic larynx, glottis and subglottic larynx are normal. No retropharyngeal collection. The parapharyngeal spaces are preserved. The parotid and submandibular glands are normal. No sialolithiasis or salivary ductal dilatation. The thyroid gland is normal. There is no cervical lymphadenopathy. Upper chest: No pneumothorax or pleural effusion. No nodules or masses. Review of the MIP images confirms the above findings CTA HEAD  FINDINGS Anterior circulation: --Intracranial internal carotid arteries: Normal. --Anterior cerebral arteries: Azygos configuration of the anterior cerebral artery. --Middle cerebral arteries: Normal. --Posterior communicating arteries: Present on the left. Not clearly seen on the right. Posterior circulation: --Posterior cerebral arteries: I cannot adequately visualize the origin of the right posterior cerebral artery. The P2 segment is normal. The left PCA arises from a fetal origin. --Superior cerebellar arteries: Normal. --Basilar artery: Normal. --Anterior inferior cerebellar arteries: Normal. --Posterior inferior cerebellar arteries: Normal. Venous sinuses: As permitted by contrast timing, patent. Anatomic variants: Azygos anterior cerebral artery. Fetal origin of the left posterior cerebral artery. Delayed phase: No parenchymal contrast enhancement. Intraparenchymal hematoma in the right temporal lobe measures 3.3 x 6.0 x 3.0 cm (volume = 31 cm^3), previously 5.4 x 3.5 x 3.1 cm (volume = 31 cm^3). Size of the ventricles are unchanged. The degree of intraventricular extension of blood is unchanged. Review of the MIP images confirms the above findings IMPRESSION: 1. Unchanged volume of intraparenchymal hemorrhage in the right temporal lobe. Unchanged intraventricular extension and ventricular configuration. 2. No emergent large vessel occlusion. Poor visualization of the origin of the right posterior cerebral artery, which probably arises from a small right posterior communicating artery. The remainder of the visible PCA is normal. 3. No vascular abnormality to account for the intraparenchymal hemorrhage. In a patient of this age, lobar parenchymal hemorrhage may be secondary to underlying amyloid angiopathy for hypertension. 4. Carotid bifurcation and  Aortic Atherosclerosis (ICD10-I70.0) without hemodynamically significant stenosis. Electronically Signed: By: Deatra Robinson M.D. On: 06/24/2017 03:31      STUDIES:  CT Head 8/22 > Large Right Temporal IPH with IV extension, mild surrounding edema. Hemorrhage fills the right lateral ventricle and tracks into the third, fourth, and left lateral ventricles.  CTA Head/Neck 8/22 > Unchanged volume of intraparenchymal hemorrhage in the right temporal lobe. Unchanged intraventricular extension and ventricular configuration. No emergent large vessel occlusion. Poor visualization of the origin of the right posterior cerebral artery, which probably arises from a small right posterior communicating artery. The remainder of the visible PCA is normal. No vascular abnormality to account for the intraparenchymal hemorrhage. In a patient of this age, lobar parenchymal hemorrhage may be secondary to underlying amyloid angiopathy for hypertension. Carotid bifurcation and Aortic Atherosclerosis (ICD10-I70.0) without hemodynamically significant stenosis. MRI Brain 8/22 >>  CULTURES: None.   ANTIBIOTICS: None.   SIGNIFICANT EVENTS: 8/21 > Presents to ED 8/22 > Taken for craniotomy   LINES/TUBES: ETT 8/22 >> CVC Left IJ 8/22 >> Aline Left Radial 8/22 >>   DISCUSSION: 83 year old presents to ED on 8/21 with Headache, N/V. Found to have right temporal IPH, Taken for craniotomy on 8/22. Remains intubated in post-op period.   ASSESSMENT / PLAN:  PULMONARY A: Respiratory Insuffiencey s/p Craniotomy  P:   Vent Support Wean as tolerated  CXR/ABG now  Pulmonary Hygiene   CARDIOVASCULAR A:  H/O CAD, HLD, HTN  P:  Cardiac Monitoring  Maintain Systolic 100-140  Cleviprex gtt if needed to maintain systolic goals   RENAL A:   CKD Stage 3  H/O Urinary retention  P:   Trend BMP  Replace electrolytes as indicated  Continue home Flomax   GASTROINTESTINAL A:   GERD  Dysphagia  P:   NPO PPI Will need speech therapy post-extubated   HEMATOLOGIC A:   Chronic Anticoagulation s/p Kcentra  P:  Trend CBC  Hold All anticoagulation  SCDS for  VTE   INFECTIOUS A:   No issues  P:   Trend WBC and Fever Curve   ENDOCRINE A:   Hypothyroidism    P:   Continue home synthroid  Trend glucose   NEUROLOGIC A:   Large Right Temporal IPH with IV extension and edema s/p Craniotomy  H/O Spinal Stenosis  P:   RASS goal: -1/-2 Per Neurology/Neurosurgery   Frequent Neuro Exams MRI pending  Wean Propofol to achieve RASS PRN fentanyl   FAMILY  - Updates: no family at bedside   - Inter-disciplinary family meet or Palliative Care meeting due by: 07/01/2017  CC Time: 42 minutes  Jovita Kussmaul, AGACNP-BC [facility]  Pgr: [phone]  PCCM Pgr: [phone]  Attending Note:  I have examined patient, reviewed labs, studies and notes. I have discussed the case with Idolina Primer, and I agree with the data and plans as amended above. 83 yo man with hx of HTN, CKD, CAD, presented with HA and N/V on 8/21. He had some progressive decrease in MS, underwent CT head that revealed R temporal ICH with ventricular blood and surrounding edema. MS continued to decline and he was therefore taken for craniotomy and evacuation. He returned to ICU on MV 8/21 am. On my eval he is beginning to wake up and become more active as propofol was just turned off. Not yet following commands. Very strong cough. Normal resp pattern with exception of freq coughing. Lungs clear. Some oral secretions noted. Heart regular.  Abdomen benign. MRI performed this am, results pending. We will keep SBP 100-140, continue efforts at SBT now that MRI has been completed. Follow up scan results. Continue synthroid.  Independent critical care time is 40 minutes.   Levy Pupa, MD, PhD 06/24/2017, 12:16 PM [facility] [phone] or if no answer [phone]

## 2017-06-24 NOTE — Anesthesia Preprocedure Evaluation (Addendum)
Anesthesia Evaluation  Patient identified by MRN, date of birth, ID band Patient confused    Reviewed: Allergy & Precautions, NPO status , Patient's Chart, lab work & pertinent test results, Unable to perform ROS - Chart review only  Airway Mallampati: III  TM Distance: >3 FB     Dental  (+) Edentulous Upper, Edentulous Lower   Pulmonary former smoker,    breath sounds clear to auscultation + decreased breath sounds      Cardiovascular hypertension,  Rhythm:Regular Rate:Normal     Neuro/Psych    GI/Hepatic   Endo/Other    Renal/GU      Musculoskeletal   Abdominal   Peds  Hematology   Anesthesia Other Findings   Reproductive/Obstetrics                           Anesthesia Physical Anesthesia Plan  ASA: IV and emergent  Anesthesia Plan: General   Post-op Pain Management:    Induction: Intravenous  PONV Risk Score and Plan: Ondansetron and Dexamethasone  Airway Management Planned: Oral ETT  Additional Equipment: CVP, Arterial line and Ultrasound Guidance Line Placement  Intra-op Plan:   Post-operative Plan: Post-operative intubation/ventilation  Informed Consent: I have reviewed the patients History and Physical, chart, labs and discussed the procedure including the risks, benefits and alternatives for the proposed anesthesia with the patient or authorized representative who has indicated his/her understanding and acceptance.   History available from chart only  Plan Discussed with: CRNA and Anesthesiologist  Anesthesia Plan Comments:        Anesthesia Quick Evaluation

## 2017-06-24 NOTE — Progress Notes (Signed)
PT Cancellation Note  Patient Details Name: Randy Durham MRN: [id number] DOB: [birth year]   Cancelled Treatment:    Reason Eval/Treat Not Completed: Patient at procedure or test/unavailable;Patient not medically ready (in OR)   Fabio Asa 06/24/2017, 7:16 AM

## 2017-06-24 NOTE — Progress Notes (Signed)
Stopped to visit w/ wife in rm while pt at MRI. He'd come in yesterday afternoon after experiencing symptoms since morning. She's said he'd had such instances before, and wondered if those had been mini-strokes. She had been here since then, and their daughter is coming is to relieve her shortly so she can go home and rest.  Provided emotional/spiritual support and prayer.Pt and family are [employer], and wife welcomed prayer, afterwards saying that she knows it works, and thank you, that's exactly what I would've wanted said for him. They have a regular church, so I encouraged her to call and let them know her husband's situation. She said they have a lot of friends too, and he was usually the communicator, and we discussed how maybe she could just call one of them, and ask them to take that role now for him while she rested. Chaplain available for f/u.   06/24/17 1100  Clinical Encounter Type  Visited With Family;Patient not available  Visit Type Initial;Psychological support;Spiritual support;Social support;Critical Care  Referral From Chaplain  Spiritual Encounters  Spiritual Needs Prayer;Emotional  Stress Factors  Patient Stress Factors Health changes  Family Stress Factors Family relationships;Health changes;Loss of control   Ephraim Hamburger, [address]

## 2017-06-24 NOTE — Anesthesia Postprocedure Evaluation (Signed)
Anesthesia Post Note  Patient: Development worker, community  Procedure(s) Performed: Procedure(s) (LRB): CRANIOTOMY HEMATOMA EVACUATION SUBDURAL (Right)     Patient location during evaluation: SICU Anesthesia Type: General Level of consciousness: sedated Pain management: pain level controlled Vital Signs Assessment: post-procedure vital signs reviewed and stable Respiratory status: patient remains intubated per anesthesia plan Cardiovascular status: stable Anesthetic complications: no    Last Vitals:  Vitals:   06/24/17 0921 06/24/17 0932  BP: (!) 141/107 (!) 142/103  Pulse: 60 (!) 50  Resp: 20 17  Temp:    SpO2: 100% 100%    Last Pain:  Vitals:   06/24/17 0845  TempSrc: Oral                 Siana Panameno DANIEL

## 2017-06-24 NOTE — Progress Notes (Signed)
Pt had second episode of nausea. Zofran given.  Will continue to monitor. Thersa Salt, RN, BSN 5:41 PM 06/24/2017

## 2017-06-24 NOTE — Progress Notes (Signed)
Pt had brief period of nausea and spit about 10 mls of secretions up. Pt reported feeling better after getting secretions out of mouth. Mouth was suctioned to decrease more secretions. Zofran was offered to patient who stated he didn't need it anymore. Pearlean Brownie, MD was notified. Pt sitting up with head of bed at 30 degrees, with [place] on at 2 L. Pt reports only mild headache. Thersa Salt, RN, BSN 2:40 PM 06/24/2017

## 2017-06-24 NOTE — Anesthesia Procedure Notes (Signed)
Procedure Name: Intubation Date/Time: 06/24/2017 5:50 AM Performed by: Molli Hazard Pre-anesthesia Checklist: Patient identified, Emergency Drugs available, Suction available and Patient being monitored Patient Re-evaluated:Patient Re-evaluated prior to induction Oxygen Delivery Method: Circle system utilized Preoxygenation: Pre-oxygenation with 100% oxygen Induction Type: IV induction, Rapid sequence and Cricoid Pressure applied Laryngoscope Size: Miller and 2 Grade View: Grade I Tube type: Subglottic suction tube Tube size: 7.5 mm Number of attempts: 1 Airway Equipment and Method: Stylet Placement Confirmation: ETT inserted through vocal cords under direct vision,  positive ETCO2 and breath sounds checked- equal and bilateral Secured at: 22 cm Tube secured with: Tape Dental Injury: Teeth and Oropharynx as per pre-operative assessment

## 2017-06-24 NOTE — Progress Notes (Signed)
Pt had a neuro status change @ 1900 with increased lethargy, disorientation, L sided drift and mild sensory deficit. MD called, orders placed for a STAT CT.  Will cont to monitor.

## 2017-06-25 ENCOUNTER — Inpatient Hospital Stay (HOSPITAL_COMMUNITY): Payer: Medicare Other

## 2017-06-25 ENCOUNTER — Encounter (HOSPITAL_COMMUNITY): Payer: Self-pay | Admitting: Neurosurgery

## 2017-06-25 DIAGNOSIS — I615 Nontraumatic intracerebral hemorrhage, intraventricular: Secondary | ICD-10-CM

## 2017-06-25 DIAGNOSIS — I69119 Unspecified symptoms and signs involving cognitive functions following nontraumatic intracerebral hemorrhage: Secondary | ICD-10-CM

## 2017-06-25 LAB — CBC
HCT: 35.8 % — ABNORMAL LOW (ref 39.0–52.0)
Hemoglobin: 11.5 g/dL — ABNORMAL LOW (ref 13.0–17.0)
MCH: 27.5 pg (ref 26.0–34.0)
MCHC: 32.1 g/dL (ref 30.0–36.0)
MCV: 85.6 fL (ref 78.0–100.0)
PLATELETS: 173 10*3/uL (ref 150–400)
RBC: 4.18 MIL/uL — AB (ref 4.22–5.81)
RDW: 13.4 % (ref 11.5–15.5)
WBC: 11.1 10*3/uL — ABNORMAL HIGH (ref 4.0–10.5)

## 2017-06-25 LAB — BASIC METABOLIC PANEL
Anion gap: 6 (ref 5–15)
BUN: 13 mg/dL (ref 6–20)
CHLORIDE: 104 mmol/L (ref 101–111)
CO2: 25 mmol/L (ref 22–32)
CREATININE: 0.99 mg/dL (ref 0.61–1.24)
Calcium: 7.9 mg/dL — ABNORMAL LOW (ref 8.9–10.3)
Glucose, Bld: 110 mg/dL — ABNORMAL HIGH (ref 65–99)
POTASSIUM: 3.8 mmol/L (ref 3.5–5.1)
SODIUM: 135 mmol/L (ref 135–145)

## 2017-06-25 LAB — MAGNESIUM: MAGNESIUM: 1.7 mg/dL (ref 1.7–2.4)

## 2017-06-25 LAB — PHOSPHORUS: PHOSPHORUS: 2.7 mg/dL (ref 2.5–4.6)

## 2017-06-25 MED ORDER — SODIUM CHLORIDE 0.9 % IV BOLUS (SEPSIS)
500.0000 mL | Freq: Once | INTRAVENOUS | Status: AC
  Administered 2017-06-25: 500 mL via INTRAVENOUS

## 2017-06-25 MED ORDER — VALPROATE SODIUM 500 MG/5ML IV SOLN
1000.0000 mg | Freq: Once | INTRAVENOUS | Status: AC
  Administered 2017-06-25: 1000 mg via INTRAVENOUS
  Filled 2017-06-25: qty 10

## 2017-06-25 MED ORDER — LISINOPRIL 5 MG PO TABS
5.0000 mg | ORAL_TABLET | Freq: Every day | ORAL | Status: DC
  Administered 2017-06-25 – 2017-06-29 (×5): 5 mg
  Filled 2017-06-25 (×6): qty 1

## 2017-06-25 MED ORDER — HYDROCODONE-ACETAMINOPHEN 5-325 MG PO TABS
1.0000 | ORAL_TABLET | ORAL | Status: DC | PRN
  Administered 2017-06-25 – 2017-06-30 (×8): 1 via ORAL
  Filled 2017-06-25 (×10): qty 1

## 2017-06-25 MED ORDER — METOCLOPRAMIDE HCL 5 MG/ML IJ SOLN: 10.0000 mg | Freq: Four times a day (QID) | INTRAMUSCULAR | Status: DC | PRN

## 2017-06-25 MED ORDER — DIVALPROEX SODIUM 500 MG PO DR TAB
500.0000 mg | DELAYED_RELEASE_TABLET | Freq: Two times a day (BID) | ORAL | Status: DC
  Administered 2017-06-25 – 2017-06-27 (×3): 500 mg via ORAL
  Filled 2017-06-25 (×6): qty 1

## 2017-06-25 NOTE — Progress Notes (Signed)
PULMONARY / CRITICAL CARE MEDICINE   Name: Duane Earnshaw MRN: [id number] DOB: [birth year]    ADMISSION DATE:  06/23/2017 CONSULTATION DATE:  06/24/2017  REFERRING MD:  Dr. Pearlean Brownie   CHIEF COMPLAINT:  Large Right Temporal IPH   HISTORY OF PRESENT ILLNESS:   83 year old male with PMH of CAD, CKD, HLD, HTN, Hypothyroidism, Spinal Stenosis   Presents to ED on 8/21 with reported 12 hours of headache, nausea, and vomiting. CT of head revealed large right temporal IPH with IV extension and mild surrounding edema. Patient admitted to Neuro ICU. At 0200 while in ICU patient was taken for a STAT head CT for an acute neuro change in which revealed unchanged amount/size of IPH. Neurosurgery consulted and taken for craniotomy. Post-OR patient remained intubated. PCCM asked to consult.   PAST MEDICAL HISTORY :  He  has a past medical history of Anemia; CAD (coronary artery disease); Chronic kidney disease (CKD), stage III (moderate); Hyperlipidemia; Hypertension; Hypothyroidism; Obesity (BMI 30-39.9); Spinal stenosis; and Thyroid nodule.  PAST SURGICAL HISTORY: He  has a past surgical history that includes Tonsillectomy; Pilonidal cyst excision; and Cardiac catheterization (N/A, 03/26/2015).  No Known Allergies  No current facility-administered medications on file prior to encounter.    Current Outpatient Prescriptions on File Prior to Encounter  Medication Sig  . apixaban (ELIQUIS) 5 MG TABS tablet Take 1 tablet (5 mg total) by mouth 2 (two) times daily.  Marland Kitchen aspirin EC 81 MG tablet Take 81 mg by mouth daily.  Marland Kitchen atorvastatin (LIPITOR) 80 MG tablet Take 1 tablet (80 mg total) by mouth daily at 6 PM.  . levothyroxine (SYNTHROID, LEVOTHROID) 112 MCG tablet Take 112 mcg by mouth daily before breakfast.  . lisinopril (PRINIVIL,ZESTRIL) 10 MG tablet Take 1 tablet (10 mg total) by mouth at bedtime.  . metoprolol tartrate (LOPRESSOR) 25 MG tablet Take 0.5 tablets (12.5 mg total) by mouth 2 (two) times daily.   . Multiple Vitamins-Minerals (CENTRUM SILVER PO) Take 1 tablet by mouth daily.  Marland Kitchen omeprazole (PRILOSEC) 20 MG capsule Take 20 mg by mouth daily.  . tamsulosin (FLOMAX) 0.4 MG CAPS capsule Take 0.4 mg by mouth daily.  Marland Kitchen acetaminophen (TYLENOL) 325 MG tablet Take 325 mg by mouth every 6 (six) hours as needed (pain).  . fluticasone (FLONASE) 50 MCG/ACT nasal spray Place 1 spray into both nostrils daily as needed (seasonal allergies).  . Menthol-Zinc Oxide (GOLD BOND EX) Apply 1 application topically at bedtime.  . nitroGLYCERIN (NITROSTAT) 0.4 MG SL tablet Place 1 tablet (0.4 mg total) under the tongue every 5 (five) minutes x 3 doses as needed for chest pain.  . sildenafil (VIAGRA) 50 MG tablet Take 50 mg by mouth daily as needed for erectile dysfunction. Take 1 hour prior to sexual activity ( do not exceed 1 dose per 24 hour period)     FAMILY HISTORY:  His indicated that his mother is deceased. He indicated that his father is deceased. He indicated that both of his sisters are deceased. He indicated that his maternal grandmother is deceased. He indicated that his maternal grandfather is deceased. He indicated that his paternal grandmother is deceased. He indicated that his paternal grandfather is deceased.    SOCIAL HISTORY: He  reports that he has quit smoking. His smoking use included Cigars and Pipe. He has never used smokeless tobacco. He reports that he drinks alcohol. He reports that he does not use drugs.  REVIEW OF SYSTEMS:   Unable to review as patient is  intubated and sedated   SUBJECTIVE:   VITAL SIGNS: BP (!) 99/55   Pulse 63   Temp 97.8 F (36.6 C) (Axillary)   Resp 17   Ht 5\' 3"  (1.6 m)   Wt 81.5 kg (179 lb 10.8 oz)   SpO2 100%   BMI 31.83 kg/m   HEMODYNAMICS:    VENTILATOR SETTINGS: Vent Mode: PRVC FiO2 (%):  [40 %] 40 % Set Rate:  [14 bmp] 14 bmp Vt Set:  [460 mL] 460 mL PEEP:  [5 cmH20] 5 cmH20  INTAKE / OUTPUT: I/O last 3 completed shifts: In: 3201.4  [I.V.:3096.4; IV Piggyback:105] Out: 1035 [Urine:825; Emesis/NG output:10; Blood:200]  PHYSICAL EXAMINATION: General:  Elderly man, comfortable, lying in bed Neuro:  Wakes easily, interacts appropriately, follows commands, occasionally confused, moves all extremities HEENT:  Oropharynx clear, no lesions Cardiovascular: Irregularly irregular, A. fib on monitor, occasional bradycardia Lungs:  Clear bilaterally no crackles or wheezes Abdomen:  Soft, benign, positive bowel sounds Musculoskeletal: No edema Skin:  No rash, cranial dressing clean and dry  LABS:  BMET  Recent Labs Lab 06/23/17 1829 06/24/17 0930 06/25/17 0412  NA 138 137 135  K 4.3 4.1 3.8  CL 105 104 104  CO2 22 22 25   BUN 14 15 13   CREATININE 1.03 1.07 0.99  GLUCOSE 113* 103* 110*    Electrolytes  Recent Labs Lab 06/23/17 1829 06/24/17 0930 06/25/17 0412  CALCIUM 9.6 9.4 7.9*  MG  --  1.9 1.7  PHOS  --  3.6 2.7    CBC  Recent Labs Lab 06/23/17 1829 06/24/17 0930 06/25/17 0412  WBC 10.5 9.1 11.1*  HGB 15.7 13.3 11.5*  HCT 45.2 39.6 35.8*  PLT 214 205 173    Coag's  Recent Labs Lab 06/24/17 0037  INR 1.04    Sepsis Markers No results for input(s): LATICACIDVEN, PROCALCITON, O2SATVEN in the last 168 hours.  ABG  Recent Labs Lab 06/24/17 1220  PHART 7.443  PCO2ART 34.9  PO2ART 187*    Liver Enzymes  Recent Labs Lab 06/23/17 1829  AST 41  ALT 33  ALKPHOS 58  BILITOT 1.5*  ALBUMIN 4.3    Cardiac Enzymes  Recent Labs Lab 06/24/17 0030  TROPONINI <0.03    Glucose  Recent Labs Lab 06/24/17 1831  GLUCAP 116*    Imaging Ct Head Wo Contrast  Result Date: 06/24/2017 CLINICAL DATA:  Status post craniotomy for RIGHT temporal lobe hematoma evacuation. LEFT-sided drift and mental status change. EXAM: CT HEAD WITHOUT CONTRAST TECHNIQUE: Contiguous axial images were obtained from the base of the skull through the vertex without intravenous contrast. COMPARISON:  CT  HEAD Mar 24, 2017 at 0012 hours and MRI of the head at 1108 hours FINDINGS: BRAIN: Interval evacuation of RIGHT temporoparietal hematoma with small amount of residual blood products, and edema. RIGHT temporal lobe pneumocephalus. Bifrontal pneumocephalus with mild mass effect on subjacent frontal sulci. Ventricles and sulci are overall normal for patient's age, with redistributed blood products in the lateral ventricles, third and fourth ventricles. No hydrocephalus. Trace RIGHT cerebellar tentorial subdural hematoma. Small amount of scattered subarachnoid hemorrhage. No midline shift, mass effect or acute large vascular territory infarcts. Basal cisterns are patent. VASCULAR: Moderate to severe calcific atherosclerosis of the carotid siphons. SKULL: Status post RIGHT temporoparietal craniotomy, overlying scalp soft tissue swelling with skin staples. Trace for 8 subgaleal fusion. SINUSES/ORBITS: Trace paranasal sinus mucosal thickening without air-fluid levels. Mastoid air cells are well aerated.The included ocular globes and orbital contents are non-suspicious.  OTHER: None. IMPRESSION: 1. Status post RIGHT craniotomy for temporoparietal hematoma evacuation. Local edema and small amount of residual intraparenchymal hemorrhage. No residual midline shift. 2. Small RIGHT cerebellar tentorium subdural hematoma and trace subarachnoid hemorrhage, likely re- distributed. 3. Intraventricular blood products without hydrocephalus. Electronically Signed   By: Awilda Metro M.D.   On: 06/24/2017 19:51   [name] GS Contrast  Result Date: 06/24/2017 CLINICAL DATA:  Intracerebral hemorrhage status post craniotomy. Altered level of consciousness. EXAM: MRI HEAD WITHOUT AND WITH CONTRAST TECHNIQUE: Multiplanar, multiecho pulse sequences of the brain and surrounding structures were obtained without and with intravenous contrast. CONTRAST:  72mL MULTIHANCE GADOBENATE DIMEGLUMINE 529 MG/ML IV SOLN COMPARISON:  Head CT/ CTA  06/24/2017 FINDINGS: Brain: Sequelae of interval posterior right temporal craniotomy are identified for evaluate duration of the previously demonstrated large right temporal parenchymal hematoma. Small volume blood products and gas are present in this region. There is a small amount of parenchymal restricted diffusion just posterior and superior to the hematoma cavity in the posterior temporal lobe/temporoparietal junction region. Small volume pneumocephalus is noted over both anterior frontal convexities without significant mass effect. A thin subdural fluid collection over the right cerebral convexity measures up to 7 mm in thickness in the temporoparietal region with only mild flattening of the underlying brain. There is a tiny subdural fluid collection over the posterior left cerebral convexity measuring 1 mm in thickness without mass effect. Small volume blood products are present in both lateral ventricles, greatly decreased on the right following interval surgical evacuation. Scattered FLAIR hyperintensity in predominantly posterior cerebral sulci bilaterally may represent a combination of subarachnoid blood (some associated susceptibility most notable in the right parietal region) and possibly artifactual lack of CSF suppression due to the patient's intubated status and supplemental oxygenation. There is no midline shift. A chronic lacunar infarct is noted anteriorly in the left centrum semiovale. Mild, smooth dural thickening and enhancement over the right cerebral convexity is likely postoperative. Mildly prominent leptomeningeal enhancement in the right temporoparietal region may be predominantly vascular in etiology. No masslike enhancement is seen. Vascular: Major intracranial vascular flow voids are preserved. Skull and upper cervical spine: No suspicious marrow lesion. Sinuses/Orbits: Unremarkable orbits. Partially visualized endotracheal tube with retained fluid in the pharynx. Mild bilateral ethmoid  and left frontal sinus mucosal thickening. Clear mastoid air cells. Other: Small right-sided scalp fluid collection at the craniotomy site. Overlying skin staples. IMPRESSION: 1. Postsurgical changes from interval right temporal hematoma evacuation. 2. Small volume acute ischemia just posterior to the surgical cavity. 3. Decreased intraventricular blood products. 4. Small right and tiny left subdural fluid collections without significant mass effect. 5. Scattered bilateral sulcal FLAIR signal abnormality which may reflect a combination of small volume subarachnoid blood products and artifact. Electronically Signed   By: Sebastian Ache M.D.   On: 06/24/2017 12:31   Dg Chest Port 1 View  Result Date: 06/25/2017 CLINICAL DATA:  Intracranial hemorrhage. EXAM: PORTABLE CHEST 1 VIEW COMPARISON:  06/24/2017 . FINDINGS: Left IJ line stable position. Stable cardiomegaly. Mild bibasilar atelectasis. Mild left base infiltrate cannot be excluded on today's exam . Small left pleural effusion cannot be excluded . No pneumothorax . IMPRESSION: 1.  Left IJ line in stable position. 2.  Stable cardiomegaly. 3. Low lung volumes. Mild basilar atelectasis. Mild left base infiltrate cannot be excluded on today's exam. Electronically Signed   By: Maisie Fus  Register   On: 06/25/2017 07:24   Dg Chest Port 1 View  Result Date: 06/24/2017  CLINICAL DATA:  Status postextubation. EXAM: PORTABLE CHEST 1 VIEW COMPARISON:  03/25/2015 chest radiographs. 06/24/2017 head and neck CTA. FINDINGS: A left jugular catheter courses into the left superior aspect of the mediastinum in then courses poor is on only to the right at the level of the aortic knob. When correlating with today's CTA, this is most likely in the central aspect of the left brachiocephalic vein, although arterial placement is not excluded. Aortic atherosclerosis is noted. The cardiac silhouette is within normal limits in size. There is minimal left basilar atelectasis. No sizable  pleural effusion or pneumothorax is identified. Thoracic spondylosis is noted. IMPRESSION: 1. Left jugular catheter as above, likely in the left brachiocephalic vein though clinical or blood gas correlation is recommended to exclude arterial placement. 2. Minimal left basilar atelectasis. Electronically Signed   By: Sebastian Ache M.D.   On: 06/24/2017 13:32     STUDIES:  CT Head 8/22 > Large Right Temporal IPH with IV extension, mild surrounding edema. Hemorrhage fills the right lateral ventricle and tracks into the third, fourth, and left lateral ventricles.  CTA Head/Neck 8/22 > Unchanged volume of intraparenchymal hemorrhage in the right temporal lobe. Unchanged intraventricular extension and ventricular configuration. No emergent large vessel occlusion. Poor visualization of the origin of the right posterior cerebral artery, which probably arises from a small right posterior communicating artery. The remainder of the visible PCA is normal. No vascular abnormality to account for the intraparenchymal hemorrhage. In a patient of this age, lobar parenchymal hemorrhage may be secondary to underlying amyloid angiopathy for hypertension. Carotid bifurcation and Aortic Atherosclerosis (ICD10-I70.0) without hemodynamically significant stenosis. MRI Brain 8/22 >> postsurgical changes, small volume acute ischemia just posterior to the surgical cavity, scattered bilateral sulcal FLAIR signal abnormality, possible subarachnoid blood versus artifact  CULTURES: None.   ANTIBIOTICS: None.   SIGNIFICANT EVENTS: 8/21 > Presents to ED 8/22 > Taken for craniotomy   LINES/TUBES: ETT 8/22 >> 8/22 CVC Left IJ 8/22 >> Aline Left Radial 8/22 >> 8/22  DISCUSSION: 83 year old presents to ED on 8/21 with Headache, N/V. Found to have right temporal IPH, Taken for craniotomy on 8/22. Remains intubated in post-op period. Extubated on 8/22  ASSESSMENT / PLAN:  PULMONARY A: Respiratory Insuffiencey s/p Craniotomy   P:   Push pulmonary hygiene  CARDIOVASCULAR A:  H/O CAD, HLD, HTN  P:  Maintain systolic blood pressure 100-140 Wean Cleviprex to off Labetalol as needed   RENAL A:   CKD Stage 3  H/O Urinary retention  P:   Follow BMP, urine output Replace electrolytes as indicated Home Flomax, plan for Foley out today  GASTROINTESTINAL A:   GERD  Dysphagia  P:   Speech therapy evaluation today PPI for prophylaxis, likely stop after he is tolerating a diet  HEMATOLOGIC A:   Chronic Anticoagulation s/p Kcentra  P:  Follow CBC Hold all anticoagulation for now Start aspirin when okay with neurology SCD in place  INFECTIOUS A:   No issues  P:   Follow clinically, WBC  ENDOCRINE A:   Hypothyroidism    P:   Home Synthroid Trend glucose   NEUROLOGIC A:   Large Right Temporal IPH with IV extension and edema s/p Craniotomy  H/O Spinal Stenosis  Headache post procedure P:   RASS goal: 0 Postop care per neurosurgery Appreciate neurology input Attempt to minimize any sedating medications including narcotics  FAMILY  - Updates: wife and family at bedside 8/23  - Inter-disciplinary family meet or Palliative  Care meeting due by: 07/01/2017  PCCM will sign off, please call if we can assist  Levy Pupa, MD, PhD 06/25/2017, 9:10 AM [facility] [phone] or if no answer [phone]

## 2017-06-25 NOTE — Progress Notes (Signed)
OT Cancellation Note  Patient Details Name: Randy Durham MRN: [id number] DOB: February 03, 1934   Cancelled Treatment:    Reason Eval/Treat Not Completed: Patient not medically ready. Pt currently on bedrest. Will continue to follow for updated activity orders prior to initiating OT evaluation. Thank you  Evette Georges [phone] 06/25/2017, 7:18 AM

## 2017-06-25 NOTE — Progress Notes (Signed)
Patient ID: Randy Durham, male   DOB: [birth year], 83 y.o.   MRN: [id number] BP (!) 122/58   Pulse 70   Temp 97.8 F (36.6 C) (Axillary)   Resp 17   Ht 5\' 3"  (1.6 m)   Wt 81.5 kg (179 lb 10.8 oz)   SpO2 100%   BMI 31.83 kg/m  Alert and oriented x 4 Moving all extremities Dressing is dry, no signs of infection Post op scan looks very good, ventricles decompressed Will follow

## 2017-06-25 NOTE — Progress Notes (Signed)
STROKE TEAM PROGRESS NOTE   SUBJECTIVE (INTERVAL HISTORY) His wife is at the bedside.  The patient is extubated, awake, and alert, and follows all commands appropriately.  On exam pt has a left homonymous hemianopia, but can name objects and write a basic phrase with significant effort.  The patient's reading appears more impaired by his stroke.  On exam, pt had increasing hypertension.  Lisinopril and HCTZ started.  Pt is endorsing headache and had some episodes of confusion overnight.  Pt also endorsing nausea.  Confusion and nausea may have been related to PRN fentanyl and/or dilaudid.  PRN zofran changed to PRN reglan.  PRN fentanyl discontinued.  PRN dilaudid continued, as pt reports less nausea with it.  Pt started on phenytoin for headache and seizure prophylaxis, loaded with 1 gram IV, followed by 500 mg PO BID.  Keppra discontinued.  Transfer to telemetry unit ordered.  OBJECTIVE Temp:  [97.8 F (36.6 C)-99.5 F (37.5 C)] 99.2 F (37.3 C) (08/23 1120) Pulse Rate:  [44-92] 64 (08/23 1300) Cardiac Rhythm: Normal sinus rhythm;Sinus bradycardia (08/23 0800) Resp:  [12-27] 16 (08/23 1300) BP: (80-144)/(34-102) 87/38 (08/23 1300) SpO2:  [94 %-100 %] 97 % (08/23 1300) Arterial Line BP: (67-111)/(54-97) 84/65 (08/22 1700) Weight:  [81.5 kg (179 lb 10.8 oz)] 81.5 kg (179 lb 10.8 oz) (08/23 0419)  CBC:   Recent Labs Lab 06/24/17 0930 06/25/17 0412  WBC 9.1 11.1*  HGB 13.3 11.5*  HCT 39.6 35.8*  MCV 85.3 85.6  PLT 205 173    Basic Metabolic Panel:   Recent Labs Lab 06/24/17 0930 06/25/17 0412  NA 137 135  K 4.1 3.8  CL 104 104  CO2 22 25  GLUCOSE 103* 110*  BUN 15 13  CREATININE 1.07 0.99  CALCIUM 9.4 7.9*  MG 1.9 1.7  PHOS 3.6 2.7    Lipid Panel:     Component Value Date/Time   CHOL 131 05/10/2015 1039   TRIG 101 06/24/2017 0930   HDL 45.00 05/10/2015 1039   CHOLHDL 3 05/10/2015 1039   VLDL 16.0 05/10/2015 1039   LDLCALC 70 05/10/2015 1039   HgbA1c:  Lab  Results  Component Value Date   HGBA1C 5.6 06/24/2017   Urine Drug Screen: No results found for: LABOPIA, COCAINSCRNUR, LABBENZ, AMPHETMU, THCU, LABBARB  Alcohol Level No results found for: ETH  IMAGING  Ct Angio Head W Or Wo Contrast Ct Angio Neck W Or Wo Contrast 06/24/2017   IMPRESSION: 1. Unchanged volume of intraparenchymal hemorrhage in the right temporal lobe. Unchanged intraventricular extension and ventricular configuration. 2. No emergent large vessel occlusion. Poor visualization of the origin of the right posterior cerebral artery, which probably arises from a small right posterior communicating artery. The remainder of the visible PCA is normal. 3. No vascular abnormality to account for the intraparenchymal hemorrhage. In a patient of this age, lobar parenchymal hemorrhage may be secondary to underlying amyloid angiopathy for hypertension. 4. Carotid bifurcation and Aortic Atherosclerosis (ICD10-I70.0) without hemodynamically significant stenosis.  Ct Head Wo Contrast 06/24/2017 IMPRESSION: Large right temporal intraparenchymal hemorrhage with intraventricular extension, mild surrounding edema. Hemorrhage fills the right lateral ventricle and tracks into the third, fourth, and left lateral ventricles. The basilar cisterns remain patent, there is no hydrocephalus.    06/25/2017 IMPRESSION: 1. Status post RIGHT craniotomy for temporoparietal hematoma evacuation. Local edema and small amount of residual intraparenchymal hemorrhage. No residual midline shift.  2. Small RIGHT cerebellar tentorium subdural hematoma and trace subarachnoid hemorrhage, likely re- distributed.  3.  Intraventricular blood products without hydrocephalus.  [name] Wo Contrast 06/24/2017 IMPRESSION: 1. Postsurgical changes from interval right temporal hematoma evacuation. 2. Small volume acute ischemia just posterior to the surgical cavity. 3. Decreased intraventricular blood products. 4. Small right and tiny  left subdural fluid collections without significant mass effect. 5. Scattered bilateral sulcal FLAIR signal abnormality which may reflect a combination of small volume subarachnoid blood products and artifact.  Dg Chest Port 1 View 06/24/2017 IMPRESSION: 1. Left jugular catheter as above, likely in the left brachiocephalic vein though clinical or blood gas correlation is recommended to exclude arterial placement. 2. Minimal left basilar atelectasis.  06/25/2017 IMPRESSION: 1.  Left IJ line in stable position. 2.  Stable cardiomegaly. 3. Low lung volumes. Mild basilar atelectasis. Mild left base infiltrate cannot be excluded on today's exam.    PHYSICAL EXAM Elderly obese Caucasian male who is currently is  Not in distress . Afebrile. Head is nontraumatic. Neck is supple without bruit.    Cardiac exam no murmur or gallop. Lungs are clear to auscultation. Distal pulses are well felt. Neurological Exam ;  Eyes open partially to sternal rub.right gaze preference and partial left gaze palsy. Left hemianopsia. Mild right left confusion. Able to read and write.  Able to move all 4 extremity   against gravity but left side less than the right. Pupils small irregular but reactive. Fundi could not be visualized. Mild left lower facial weakness. Tongue midline. Tone appears to be normal bilaterally. Both plantars are downgoing.  ASSESSMENT/PLAN [name] is a 83 y.o. male with history of CAD, CKD, HLD, HTN, hypothyroidism and spinal stenosis who presented to the ED with somnolence and headache since Tuesday morning.  He did not receive IV t-PA due to ICH.   Stroke: Large R temporal IPH with extension to R lateral, 3rd, and 4th ventricle with mild associated cytotoxic edema, and no hydrocephalus, in the setting of moderate hypertension and anticoagulation with Eliquis and aspirin, and possible amyloid angiopathy. Status post craniotomy for hematoma evacuation on 06/24/17  Resultant  left  hemiparesis  CT head: Large right temporal intraparenchymal hemorrhage with intraventricular extension, mild surrounding edema. Hemorrhage fills the right lateral ventricle and tracks into the third, fourth, and left lateral ventricles.  MRI head (POD#0). Small volume acute ischemia posterior to surgical cavity, small right and tiny left subdural fluid collections without significant mass effect, small volume subarachnoid blood products versus artifact.  MRA head: not ordered  CTA head/neck: No vascular abnormality to account for the intraparenchymal hemorrhage  2D Echo: not ordered   LDL 70  HgbA1c pending   SCDs for VTE prophylaxis Diet clear liquid Room service appropriate? Yes; Fluid consistency: Thin  aspirin 81 mg daily and Eliquis (apixaban) daily prior to admission, now on No antithrombotic  Patient counseled to be compliant with his antithrombotic medications  Ongoing aggressive stroke risk factor management  Therapy recommendations:   pending  Disposition:   pending  Hypertension  Stable  Off clevidipine  Added lisinopril and HCTZ Goal SBP < 140 for first 24-hours post-hemorrhage Long-term BP goal normotensive  Hyperlipidemia  Home meds: atorvastatin 80 mg PO daily  LDL 70, goal < 70  Resume statin once ICH is stable  Continue statin at discharge  Other Stroke Risk Factors  Advanced age  ETOH use, advised to drink no more than 2 drink(s) a day  Obesity, Body mass index is 31.83 kg/m., recommend weight loss, diet and exercise as appropriate   Other Active  Problems  Headache: Loaded with 1g phenytoin IV, then 500 mg PO BID for headache and seizure prophylaxis in setting of IPH; PRN dilaudid  Hospital day # 1 I have personally examined this patient, reviewed notes, independently viewed imaging studies, participated in medical decision making and plan of care.ROS completed by me personally and pertinent positives fully documented  I have made any  additions or clarifications directly to the above note. The patient presented with a moderate size right temporal parenchymal hemorrhage but with intraventricular extension and was at significant risk for worsening cytotoxic edema and hydrocephalus and underwent emergent right temporal craniotomy for hematoma evacuation. Plan  Continue strict control of blood pressure with systolic blood pressure goal less than 140 for 24 hours. Close neurological monitoring. Plan transfer to neurology floor bed today Discussed with Dr.Byrum and patient`s wife and  answered questions. Patient remains at risk for embolic strokes due to atrial fibrillation but clearly is not a candidate for anticoagulation given his brain hemorrhage. May consider restarting aspirin in 1-2 weeks This patient is critically ill and at significant risk of neurological worsening, death and care requires constant monitoring of vital signs, hemodynamics,respiratory and cardiac monitoring, extensive review of multiple databases, frequent neurological assessment, discussion with family, other specialists and medical decision making of high complexity.I have made any additions or clarifications directly to the above note.This critical care time does not reflect procedure time, or teaching time or supervisory time of PA/NP/Med Resident etc but could involve care discussion time.  I spent 30 minutes of neurocritical care time  in the care of  this patient.     Delia Heady, MD Medical Director [facility] Pager: [phone] 06/25/2017 1:22 PM   To contact Stroke Continuity provider, please refer to [email or web address]. After hours, contact General Neurology

## 2017-06-25 NOTE — Progress Notes (Signed)
Pt transferred to unit with no noted distress. Surgical dressing clean dry and intact. Pt stable, neuro intact. Pt forgetful at times. Telemetry monitoring. Pt oriented to room. Safety measures in place. Call bell within reach. Family at bedside. Will continue to monitor.

## 2017-06-25 NOTE — Evaluation (Cosign Needed)
Speech Language Pathology Evaluation Patient Details Name: Randy Durham MRN: [id number] DOB: 11-18-1933 Today's Date: 06/25/2017 Time: 9024-0973 SLP Time Calculation (min) (ACUTE ONLY): 25 min  Problem List:  Patient Active Problem List   Diagnosis Date Noted  . Intracerebral hemorrhage 06/24/2017  . ICH (intracerebral hemorrhage) (HCC) 06/24/2017  . Intraventricular hemorrhage (HCC) 06/24/2017  . Cytotoxic brain edema (HCC) 06/24/2017  . Paroxysmal atrial fibrillation (HCC) 03/30/2015  . Bradycardia 03/27/2015  . NSVT (nonsustained ventricular tachycardia) (HCC) 03/27/2015  . CAD- occluded distal LAD, 75-80% CFX- medical Rx 03/27/2015  . NSTEMI 03/25/15 03/25/2015  . Chronic kidney disease (CKD), stage III (moderate)   . Obesity (BMI 30-39.9)   . Spinal stenosis   . Hypertension   . Hyperlipidemia    Past Medical History:  Past Medical History:  Diagnosis Date  . Anemia   . CAD (coronary artery disease)    Cath 03/26/2015 100% distal LAD stenosis, 80% ostial D1 stenosis, 75% ramus stenosis, 60% RPDA stenosis, 30% proximal RCA stenosis. Medical therapy, outpt myoview to assess LCx lesion  . Chronic kidney disease (CKD), stage III (moderate)   . Hyperlipidemia   . Hypertension   . Hypothyroidism   . Obesity (BMI 30-39.9)   . Spinal stenosis   . Thyroid nodule    Past Surgical History:  Past Surgical History:  Procedure Laterality Date  . CARDIAC CATHETERIZATION N/A 03/26/2015   Procedure: Left Heart Cath and Coronary Angiography;  Surgeon: Runell Gess, MD;  Location: [facility] INVASIVE CV LAB;  Service: Cardiovascular;  Laterality: N/A;  . PILONIDAL CYST EXCISION    . TONSILLECTOMY     HPI:  83 year old male with PMH of CAD, CKD, HLD, HTN, Hypothyroidism, Spinal Stenosis. Presents to ED on 8/21 with reported 12 hours of headache, nausea, and vomiting. CT of head revealed large right temporal IPH with IV extension and mild surrounding edema. Patient admitted to Neuro ICU. At  0200 while in ICU patient was taken for a STAT head CT for an acute neuro change in which revealed unchanged amount/size of IPH. Neurosurgery consulted and taken for craniotomy 8/22, intubated about 12 hours. CT on 8/22 shows Status post RIGHT craniotomy for temporoparietal hematoma evacuation. Local edema and small amount of residual intraparenchymal hemorrhage. No residual midline shift. Small RIGHT cerebellar tentorium subdural hematoma and trace subarachnoid hemorrhage, likely re- distributed.   Assessment / Plan / Recommendation Clinical Impression  Pt demonstrates cognitive impairment following crainiotomy for right intraventricular hemorrhage. Pts attention impaired, primarily due to distraction from pain but suspect potential right hemisphere deficits impacting cognition. Observed signs of motor processing issues on left side. Higher level cognitive functions unable to be tested due to attention impairment. Pt speech and language within normal limits. Will continue to follow for further diagnostic of cognitive function as pt is able to tolerate activity.     SLP Assessment  SLP Recommendation/Assessment: Patient needs continued Speech Lanaguage Pathology Services SLP Visit Diagnosis: Attention and concentration deficit Attention and concentration deficit following: Nontraumatic intracerebral hemorrhage    Follow Up Recommendations  Inpatient Rehab    Frequency and Duration min 2x/week  2 weeks      SLP Evaluation Cognition  Overall Cognitive Status: Impaired/Different from baseline Arousal/Alertness: Awake/alert Orientation Level: Oriented to person;Oriented to place Attention: Selective Selective Attention: Impaired Selective Attention Impairment: Verbal basic;Functional basic Memory: Impaired Memory Impairment: Other (comment) (Attention deficits impede working memory)       Community education officer Comprehension Overall Auditory Comprehension: Impaired Yes/No Questions: Not  tested Commands: Impaired One Step Basic Commands: 75-100% accurate Two Step Basic Commands: 50-74% accurate Conversation: Simple Interfering Components: Attention;Motor planning;Pain;Visual impairments EffectiveTechniques: Repetition Visual Recognition/Discrimination Discrimination: Not tested Reading Comprehension Reading Status: Not tested    Expression Expression Primary Mode of Expression: Verbal Verbal Expression Overall Verbal Expression: Appears within functional limits for tasks assessed   Oral / Motor  Oral Motor/Sensory Function Overall Oral Motor/Sensory Function: Within functional limits Motor Speech Overall Motor Speech: Appears within functional limits for tasks assessed   GO                    Carmela Rima, Student SLP 06/25/2017, 12:15 PM

## 2017-06-25 NOTE — Consult Note (Addendum)
Physical Medicine and Rehabilitation Consult Reason for Consult: Decreased functional mobility Referring Physician: Dr. Pearlean Brownie   HPI: Randy Durham is a 83 y.o. right handed male with history of CAD maintained on Eliquis, hypertension, CKD stage III. Per chart review patient lives with wife in [place]. Independent and driving prior to admission. One level apartment with 10 steps to entry. Wife can assist but limited physically he also has a daughter in the area that works. Presented 06/24/2017 with somnolence and increasing headache as well as bouts of nausea vomiting and chills. CT of the head showed a large right temporal intraparenchymal hemorrhage with intraventricular extension and mild surrounding edema. Hemorrhage fills the right lateral ventricle and tracks into the third, fourth and left lateral ventricles. Underwent craniotomy right temporal 4 evacuation of hematoma 06/24/2017 per Dr. Mikal Plane. Maintained on Depakote for seizure prophylaxis. Formal physical and occupational therapy evaluations pending. M.D. has requested physical medicine rehabilitation consult.   Review of Systems  Constitutional: Positive for malaise/fatigue. Negative for chills and fever.  HENT: Negative for hearing loss.   Eyes: Negative for blurred vision and double vision.  Respiratory: Negative for cough and shortness of breath.   Cardiovascular: Positive for palpitations and leg swelling. Negative for chest pain.  Gastrointestinal: Positive for constipation, nausea and vomiting.  Genitourinary: Positive for urgency.  Musculoskeletal: Positive for joint pain and myalgias.  Skin: Negative for rash.  Neurological: Positive for headaches. Negative for seizures.  All other systems reviewed and are negative.  Past Medical History:  Diagnosis Date  . Anemia   . CAD (coronary artery disease)    Cath 03/26/2015 100% distal LAD stenosis, 80% ostial D1 stenosis, 75% ramus stenosis, 60% RPDA stenosis, 30%  proximal RCA stenosis. Medical therapy, outpt myoview to assess LCx lesion  . Chronic kidney disease (CKD), stage III (moderate)   . Hyperlipidemia   . Hypertension   . Hypothyroidism   . Obesity (BMI 30-39.9)   . Spinal stenosis   . Thyroid nodule    Past Surgical History:  Procedure Laterality Date  . CARDIAC CATHETERIZATION N/A 03/26/2015   Procedure: Left Heart Cath and Coronary Angiography;  Surgeon: Runell Gess, MD;  Location: [facility] INVASIVE CV LAB;  Service: Cardiovascular;  Laterality: N/A;  . CRANIOTOMY Right 06/24/2017   Procedure: CRANIOTOMY HEMATOMA EVACUATION SUBDURAL;  Surgeon: Coletta Memos, MD;  Location: [facility] OR;  Service: Neurosurgery;  Laterality: Right;  . PILONIDAL CYST EXCISION    . TONSILLECTOMY     Family History  Problem Relation Age of Onset  . Heart attack Father 33       cause of death  . Prostate cancer Father   . Cancer Sister   . Heart disease Sister    Social History:  reports that he has quit smoking. His smoking use included Cigars and Pipe. He has never used smokeless tobacco. He reports that he drinks alcohol. He reports that he does not use drugs. Allergies: No Known Allergies Medications Prior to Admission  Medication Sig Dispense Refill  . apixaban (ELIQUIS) 5 MG TABS tablet Take 1 tablet (5 mg total) by mouth 2 (two) times daily. 60 tablet 5  . aspirin EC 81 MG tablet Take 81 mg by mouth daily.    Marland Kitchen atorvastatin (LIPITOR) 80 MG tablet Take 1 tablet (80 mg total) by mouth daily at 6 PM. 30 tablet 5  . levothyroxine (SYNTHROID, LEVOTHROID) 112 MCG tablet Take 112 mcg by mouth daily before breakfast.    . lisinopril (  PRINIVIL,ZESTRIL) 10 MG tablet Take 1 tablet (10 mg total) by mouth at bedtime. 30 tablet 5  . metoprolol tartrate (LOPRESSOR) 25 MG tablet Take 0.5 tablets (12.5 mg total) by mouth 2 (two) times daily. 30 tablet 11  . Multiple Vitamins-Minerals (CENTRUM SILVER PO) Take 1 tablet by mouth daily.    Marland Kitchen omeprazole (PRILOSEC) 20 MG  capsule Take 20 mg by mouth daily.    . tamsulosin (FLOMAX) 0.4 MG CAPS capsule Take 0.4 mg by mouth daily.    Marland Kitchen acetaminophen (TYLENOL) 325 MG tablet Take 325 mg by mouth every 6 (six) hours as needed (pain).    . fluticasone (FLONASE) 50 MCG/ACT nasal spray Place 1 spray into both nostrils daily as needed (seasonal allergies).    . Menthol-Zinc Oxide (GOLD BOND EX) Apply 1 application topically at bedtime.    . nitroGLYCERIN (NITROSTAT) 0.4 MG SL tablet Place 1 tablet (0.4 mg total) under the tongue every 5 (five) minutes x 3 doses as needed for chest pain. 25 tablet 6  . sildenafil (VIAGRA) 50 MG tablet Take 50 mg by mouth daily as needed for erectile dysfunction. Take 1 hour prior to sexual activity ( do not exceed 1 dose per 24 hour period)       Home: Home Living Family/patient expects to be discharged to:: Private residence Living Arrangements: Spouse/significant other  Functional History:   Functional Status:  Mobility:          ADL:    Cognition: Cognition Overall Cognitive Status: Impaired/Different from baseline Arousal/Alertness: Awake/alert Orientation Level: Oriented to person, Oriented to place Attention: Selective Selective Attention: Impaired Selective Attention Impairment: Verbal basic, Functional basic Memory: Impaired Memory Impairment: Other (comment) (Attention deficits impede working memory) IT consultant Overall Cognitive Status: Impaired/Different from baseline  Blood pressure (!) 103/47, pulse (!) 57, temperature 98.5 F (36.9 C), temperature source Oral, resp. rate 13, height 5\' 3"  (1.6 m), weight 81.5 kg (179 lb 10.8 oz), SpO2 96 %. Physical Exam  Constitutional: He appears well-developed.  Eyes:  Pupils reactive to light  Neck: Normal range of motion. Neck supple. No thyromegaly present.  Cardiovascular: Normal rate, regular rhythm and normal heart sounds.   Respiratory: Effort normal and breath sounds normal. No respiratory distress.  GI: Soft.  Bowel sounds are normal. He exhibits no distension.  Neurological:  Patient lethargic but arousable. He keeps his eyes closed during exam. Recognized his name but said he was in [place], [place]. Did move all 4's spontaneously but inconsistently to command. Restless.   Skin:  Craniotomy site clean and dry  Psychiatric:  Lethargic    Results for orders placed or performed during the hospital encounter of 06/23/17 (from the past 24 hour(s))  Hemoglobin A1c     Status: None   Collection Time: 06/24/17  5:45 PM  Result Value Ref Range   Hgb A1c MFr Bld 5.6 4.8 - 5.6 %   Mean Plasma Glucose 114.02 mg/dL  Glucose, capillary     Status: Abnormal   Collection Time: 06/24/17  6:31 PM  Result Value Ref Range   Glucose-Capillary 116 (H) 65 - 99 mg/dL  Basic metabolic panel     Status: Abnormal   Collection Time: 06/25/17  4:12 AM  Result Value Ref Range   Sodium 135 135 - 145 mmol/L   Potassium 3.8 3.5 - 5.1 mmol/L   Chloride 104 101 - 111 mmol/L   CO2 25 22 - 32 mmol/L   Glucose, Bld 110 (H) 65 - 99 mg/dL  BUN 13 6 - 20 mg/dL   Creatinine, Ser 4.54 0.61 - 1.24 mg/dL   Calcium 7.9 (L) 8.9 - 10.3 mg/dL   GFR calc non Af Amer >60 >60 mL/min   GFR calc Af Amer >60 >60 mL/min   Anion gap 6 5 - 15  CBC     Status: Abnormal   Collection Time: 06/25/17  4:12 AM  Result Value Ref Range   WBC 11.1 (H) 4.0 - 10.5 K/uL   RBC 4.18 (L) 4.22 - 5.81 MIL/uL   Hemoglobin 11.5 (L) 13.0 - 17.0 g/dL   HCT 09.8 (L) 11.9 - 14.7 %   MCV 85.6 78.0 - 100.0 fL   MCH 27.5 26.0 - 34.0 pg   MCHC 32.1 30.0 - 36.0 g/dL   RDW 82.9 56.2 - 13.0 %   Platelets 173 150 - 400 K/uL  Magnesium     Status: None   Collection Time: 06/25/17  4:12 AM  Result Value Ref Range   Magnesium 1.7 1.7 - 2.4 mg/dL  Phosphorus     Status: None   Collection Time: 06/25/17  4:12 AM  Result Value Ref Range   Phosphorus 2.7 2.5 - 4.6 mg/dL   Ct Angio Head W Or Wo Contrast  Addendum Date: 06/24/2017   ADDENDUM REPORT: 06/24/2017  03:50 ADDENDUM: There is bilateral atherosclerotic calcification of the internal carotid arteries at the skullbase without hemodynamically significant stenosis. This was erroneously labeled as normal in the findings of the initial report. Electronically Signed   By: Deatra Robinson M.D.   On: 06/24/2017 03:50   Result Date: 06/24/2017 CLINICAL DATA:  Intracranial hemorrhage EXAM: CT ANGIOGRAPHY HEAD AND NECK TECHNIQUE: Multidetector CT imaging of the head and neck was performed using the standard protocol during bolus administration of intravenous contrast. Multiplanar CT image reconstructions and MIPs were obtained to evaluate the vascular anatomy. Carotid stenosis measurements (when applicable) are obtained utilizing NASCET criteria, using the distal internal carotid diameter as the denominator. CONTRAST:  50 mL Isovue 370 COMPARISON:  Head CT 06/24/2017 FINDINGS: CTA NECK FINDINGS Aortic arch: There is no aneurysm or dissection of the visualized ascending aorta or aortic arch. Normal 3 vessel aortic branching pattern. The visualized proximal subclavian arteries are normal. Right carotid system: The right common carotid origin is widely patent. There is no common carotid or internal carotid artery dissection or aneurysm. Atherosclerotic calcification at the carotid bifurcation without hemodynamically significant stenosis. Left carotid system: The left common carotid origin is widely patent. There is no common carotid or internal carotid artery dissection or aneurysm. Atherosclerotic calcification at the carotid bifurcation without hemodynamically significant stenosis. Vertebral arteries: The vertebral system is left dominant. Both vertebral artery origins are normal. Both vertebral arteries are normal to their confluence with the basilar artery. Skeleton: There is no bony spinal canal stenosis. No lytic or blastic lesions. Other neck: The nasopharynx is clear. The oropharynx and hypopharynx are normal. The  epiglottis is normal. The supraglottic larynx, glottis and subglottic larynx are normal. No retropharyngeal collection. The parapharyngeal spaces are preserved. The parotid and submandibular glands are normal. No sialolithiasis or salivary ductal dilatation. The thyroid gland is normal. There is no cervical lymphadenopathy. Upper chest: No pneumothorax or pleural effusion. No nodules or masses. Review of the MIP images confirms the above findings CTA HEAD FINDINGS Anterior circulation: --Intracranial internal carotid arteries: Normal. --Anterior cerebral arteries: Azygos configuration of the anterior cerebral artery. --Middle cerebral arteries: Normal. --Posterior communicating arteries: Present on the left. Not clearly seen  on the right. Posterior circulation: --Posterior cerebral arteries: I cannot adequately visualize the origin of the right posterior cerebral artery. The P2 segment is normal. The left PCA arises from a fetal origin. --Superior cerebellar arteries: Normal. --Basilar artery: Normal. --Anterior inferior cerebellar arteries: Normal. --Posterior inferior cerebellar arteries: Normal. Venous sinuses: As permitted by contrast timing, patent. Anatomic variants: Azygos anterior cerebral artery. Fetal origin of the left posterior cerebral artery. Delayed phase: No parenchymal contrast enhancement. Intraparenchymal hematoma in the right temporal lobe measures 3.3 x 6.0 x 3.0 cm (volume = 31 cm^3), previously 5.4 x 3.5 x 3.1 cm (volume = 31 cm^3). Size of the ventricles are unchanged. The degree of intraventricular extension of blood is unchanged. Review of the MIP images confirms the above findings IMPRESSION: 1. Unchanged volume of intraparenchymal hemorrhage in the right temporal lobe. Unchanged intraventricular extension and ventricular configuration. 2. No emergent large vessel occlusion. Poor visualization of the origin of the right posterior cerebral artery, which probably arises from a small right  posterior communicating artery. The remainder of the visible PCA is normal. 3. No vascular abnormality to account for the intraparenchymal hemorrhage. In a patient of this age, lobar parenchymal hemorrhage may be secondary to underlying amyloid angiopathy for hypertension. 4. Carotid bifurcation and Aortic Atherosclerosis (ICD10-I70.0) without hemodynamically significant stenosis. Electronically Signed: By: Deatra Robinson M.D. On: 06/24/2017 03:31   Ct Head Wo Contrast  Result Date: 06/24/2017 CLINICAL DATA:  Status post craniotomy for RIGHT temporal lobe hematoma evacuation. LEFT-sided drift and mental status change. EXAM: CT HEAD WITHOUT CONTRAST TECHNIQUE: Contiguous axial images were obtained from the base of the skull through the vertex without intravenous contrast. COMPARISON:  CT HEAD Mar 24, 2017 at 0012 hours and MRI of the head at 1108 hours FINDINGS: BRAIN: Interval evacuation of RIGHT temporoparietal hematoma with small amount of residual blood products, and edema. RIGHT temporal lobe pneumocephalus. Bifrontal pneumocephalus with mild mass effect on subjacent frontal sulci. Ventricles and sulci are overall normal for patient's age, with redistributed blood products in the lateral ventricles, third and fourth ventricles. No hydrocephalus. Trace RIGHT cerebellar tentorial subdural hematoma. Small amount of scattered subarachnoid hemorrhage. No midline shift, mass effect or acute large vascular territory infarcts. Basal cisterns are patent. VASCULAR: Moderate to severe calcific atherosclerosis of the carotid siphons. SKULL: Status post RIGHT temporoparietal craniotomy, overlying scalp soft tissue swelling with skin staples. Trace for 8 subgaleal fusion. SINUSES/ORBITS: Trace paranasal sinus mucosal thickening without air-fluid levels. Mastoid air cells are well aerated.The included ocular globes and orbital contents are non-suspicious. OTHER: None. IMPRESSION: 1. Status post RIGHT craniotomy for  temporoparietal hematoma evacuation. Local edema and small amount of residual intraparenchymal hemorrhage. No residual midline shift. 2. Small RIGHT cerebellar tentorium subdural hematoma and trace subarachnoid hemorrhage, likely re- distributed. 3. Intraventricular blood products without hydrocephalus. Electronically Signed   By: Awilda Metro M.D.   On: 06/24/2017 19:51   Ct Head Wo Contrast  Result Date: 06/24/2017 CLINICAL DATA:  Altered level of consciousness. EXAM: CT HEAD WITHOUT CONTRAST TECHNIQUE: Contiguous axial images were obtained from the base of the skull through the vertex without intravenous contrast. COMPARISON:  None. FINDINGS: Brain: Large right temporal parenchymal hematoma extending into and filling the right lateral ventricle. Intraparenchymal component measures 3.5 x 6.7 x 3.1 cm (volume = 38 cm^3) with mild surrounding edema. Blood tracks into the third and fourth ventricle, with minimal dependent hemorrhage in the occipital horn of the left lateral ventricle. No discrete hydrocephalus. The basilar cisterns remain patent.  Hemorrhage crosses the midline without discrete midline shift. No subdural blood. Moderate generalized atrophy. Chronic small vessel ischemia. Vascular: Atherosclerosis of skullbase vasculature without hyperdense vessel or abnormal calcification. Skull: No skull fracture. Sinuses/Orbits: Paranasal sinuses and mastoid air cells are clear. The visualized orbits are unremarkable. Other: None. IMPRESSION: Large right temporal intraparenchymal hemorrhage with intraventricular extension, mild surrounding edema. Hemorrhage fills the right lateral ventricle and tracks into the third, fourth, and left lateral ventricles. The basilar cisterns remain patent, there is no hydrocephalus. Critical Value/emergent results were called by telephone at the time of interpretation on 06/24/2017 at 12:25 am to Dr. Ross Marcus , who verbally acknowledged these results. Electronically  Signed   By: Rubye Oaks M.D.   On: 06/24/2017 00:26   Ct Angio Neck W Or Wo Contrast  Addendum Date: 06/24/2017   ADDENDUM REPORT: 06/24/2017 03:50 ADDENDUM: There is bilateral atherosclerotic calcification of the internal carotid arteries at the skullbase without hemodynamically significant stenosis. This was erroneously labeled as normal in the findings of the initial report. Electronically Signed   By: Deatra Robinson M.D.   On: 06/24/2017 03:50   Result Date: 06/24/2017 CLINICAL DATA:  Intracranial hemorrhage EXAM: CT ANGIOGRAPHY HEAD AND NECK TECHNIQUE: Multidetector CT imaging of the head and neck was performed using the standard protocol during bolus administration of intravenous contrast. Multiplanar CT image reconstructions and MIPs were obtained to evaluate the vascular anatomy. Carotid stenosis measurements (when applicable) are obtained utilizing NASCET criteria, using the distal internal carotid diameter as the denominator. CONTRAST:  50 mL Isovue 370 COMPARISON:  Head CT 06/24/2017 FINDINGS: CTA NECK FINDINGS Aortic arch: There is no aneurysm or dissection of the visualized ascending aorta or aortic arch. Normal 3 vessel aortic branching pattern. The visualized proximal subclavian arteries are normal. Right carotid system: The right common carotid origin is widely patent. There is no common carotid or internal carotid artery dissection or aneurysm. Atherosclerotic calcification at the carotid bifurcation without hemodynamically significant stenosis. Left carotid system: The left common carotid origin is widely patent. There is no common carotid or internal carotid artery dissection or aneurysm. Atherosclerotic calcification at the carotid bifurcation without hemodynamically significant stenosis. Vertebral arteries: The vertebral system is left dominant. Both vertebral artery origins are normal. Both vertebral arteries are normal to their confluence with the basilar artery. Skeleton: There is  no bony spinal canal stenosis. No lytic or blastic lesions. Other neck: The nasopharynx is clear. The oropharynx and hypopharynx are normal. The epiglottis is normal. The supraglottic larynx, glottis and subglottic larynx are normal. No retropharyngeal collection. The parapharyngeal spaces are preserved. The parotid and submandibular glands are normal. No sialolithiasis or salivary ductal dilatation. The thyroid gland is normal. There is no cervical lymphadenopathy. Upper chest: No pneumothorax or pleural effusion. No nodules or masses. Review of the MIP images confirms the above findings CTA HEAD FINDINGS Anterior circulation: --Intracranial internal carotid arteries: Normal. --Anterior cerebral arteries: Azygos configuration of the anterior cerebral artery. --Middle cerebral arteries: Normal. --Posterior communicating arteries: Present on the left. Not clearly seen on the right. Posterior circulation: --Posterior cerebral arteries: I cannot adequately visualize the origin of the right posterior cerebral artery. The P2 segment is normal. The left PCA arises from a fetal origin. --Superior cerebellar arteries: Normal. --Basilar artery: Normal. --Anterior inferior cerebellar arteries: Normal. --Posterior inferior cerebellar arteries: Normal. Venous sinuses: As permitted by contrast timing, patent. Anatomic variants: Azygos anterior cerebral artery. Fetal origin of the left posterior cerebral artery. Delayed phase: No parenchymal contrast enhancement. Intraparenchymal  hematoma in the right temporal lobe measures 3.3 x 6.0 x 3.0 cm (volume = 31 cm^3), previously 5.4 x 3.5 x 3.1 cm (volume = 31 cm^3). Size of the ventricles are unchanged. The degree of intraventricular extension of blood is unchanged. Review of the MIP images confirms the above findings IMPRESSION: 1. Unchanged volume of intraparenchymal hemorrhage in the right temporal lobe. Unchanged intraventricular extension and ventricular configuration. 2. No  emergent large vessel occlusion. Poor visualization of the origin of the right posterior cerebral artery, which probably arises from a small right posterior communicating artery. The remainder of the visible PCA is normal. 3. No vascular abnormality to account for the intraparenchymal hemorrhage. In a patient of this age, lobar parenchymal hemorrhage may be secondary to underlying amyloid angiopathy for hypertension. 4. Carotid bifurcation and Aortic Atherosclerosis (ICD10-I70.0) without hemodynamically significant stenosis. Electronically Signed: By: Deatra Robinson M.D. On: 06/24/2017 03:31   [name] ZO Contrast  Result Date: 06/24/2017 CLINICAL DATA:  Intracerebral hemorrhage status post craniotomy. Altered level of consciousness. EXAM: MRI HEAD WITHOUT AND WITH CONTRAST TECHNIQUE: Multiplanar, multiecho pulse sequences of the brain and surrounding structures were obtained without and with intravenous contrast. CONTRAST:  18mL MULTIHANCE GADOBENATE DIMEGLUMINE 529 MG/ML IV SOLN COMPARISON:  Head CT/ CTA 06/24/2017 FINDINGS: Brain: Sequelae of interval posterior right temporal craniotomy are identified for evaluate duration of the previously demonstrated large right temporal parenchymal hematoma. Small volume blood products and gas are present in this region. There is a small amount of parenchymal restricted diffusion just posterior and superior to the hematoma cavity in the posterior temporal lobe/temporoparietal junction region. Small volume pneumocephalus is noted over both anterior frontal convexities without significant mass effect. A thin subdural fluid collection over the right cerebral convexity measures up to 7 mm in thickness in the temporoparietal region with only mild flattening of the underlying brain. There is a tiny subdural fluid collection over the posterior left cerebral convexity measuring 1 mm in thickness without mass effect. Small volume blood products are present in both lateral  ventricles, greatly decreased on the right following interval surgical evacuation. Scattered FLAIR hyperintensity in predominantly posterior cerebral sulci bilaterally may represent a combination of subarachnoid blood (some associated susceptibility most notable in the right parietal region) and possibly artifactual lack of CSF suppression due to the patient's intubated status and supplemental oxygenation. There is no midline shift. A chronic lacunar infarct is noted anteriorly in the left centrum semiovale. Mild, smooth dural thickening and enhancement over the right cerebral convexity is likely postoperative. Mildly prominent leptomeningeal enhancement in the right temporoparietal region may be predominantly vascular in etiology. No masslike enhancement is seen. Vascular: Major intracranial vascular flow voids are preserved. Skull and upper cervical spine: No suspicious marrow lesion. Sinuses/Orbits: Unremarkable orbits. Partially visualized endotracheal tube with retained fluid in the pharynx. Mild bilateral ethmoid and left frontal sinus mucosal thickening. Clear mastoid air cells. Other: Small right-sided scalp fluid collection at the craniotomy site. Overlying skin staples. IMPRESSION: 1. Postsurgical changes from interval right temporal hematoma evacuation. 2. Small volume acute ischemia just posterior to the surgical cavity. 3. Decreased intraventricular blood products. 4. Small right and tiny left subdural fluid collections without significant mass effect. 5. Scattered bilateral sulcal FLAIR signal abnormality which may reflect a combination of small volume subarachnoid blood products and artifact. Electronically Signed   By: Sebastian Ache M.D.   On: 06/24/2017 12:31   Dg Chest Port 1 View  Result Date: 06/25/2017 CLINICAL DATA:  Intracranial hemorrhage. EXAM:  PORTABLE CHEST 1 VIEW COMPARISON:  06/24/2017 . FINDINGS: Left IJ line stable position. Stable cardiomegaly. Mild bibasilar atelectasis. Mild left  base infiltrate cannot be excluded on today's exam . Small left pleural effusion cannot be excluded . No pneumothorax . IMPRESSION: 1.  Left IJ line in stable position. 2.  Stable cardiomegaly. 3. Low lung volumes. Mild basilar atelectasis. Mild left base infiltrate cannot be excluded on today's exam. Electronically Signed   By: Maisie Fus  Register   On: 06/25/2017 07:24   Dg Chest Port 1 View  Result Date: 06/24/2017 CLINICAL DATA:  Status postextubation. EXAM: PORTABLE CHEST 1 VIEW COMPARISON:  03/25/2015 chest radiographs. 06/24/2017 head and neck CTA. FINDINGS: A left jugular catheter courses into the left superior aspect of the mediastinum in then courses poor is on only to the right at the level of the aortic knob. When correlating with today's CTA, this is most likely in the central aspect of the left brachiocephalic vein, although arterial placement is not excluded. Aortic atherosclerosis is noted. The cardiac silhouette is within normal limits in size. There is minimal left basilar atelectasis. No sizable pleural effusion or pneumothorax is identified. Thoracic spondylosis is noted. IMPRESSION: 1. Left jugular catheter as above, likely in the left brachiocephalic vein though clinical or blood gas correlation is recommended to exclude arterial placement. 2. Minimal left basilar atelectasis. Electronically Signed   By: Sebastian Ache M.D.   On: 06/24/2017 13:32    Assessment/Plan: Diagnosis: right temporal ICH 1. Does the need for close, 24 hr/day medical supervision in concert with the patient's rehab needs make it unreasonable for this patient to be served in a less intensive setting? Yes 2. Co-Morbidities requiring supervision/potential complications: CAD, HTN, altered day/night rhythm, cognitive-behavioral issues 3. Due to bladder management, bowel management, safety, skin/wound care, disease management, medication administration, pain management and patient education, does the patient require 24  hr/day rehab nursing? Yes 4. Does the patient require coordinated care of a physician, rehab nurse, PT (1-2 hrs/day, 5 days/week), OT (1-2 hrs/day, 5 days/week) and SLP (1-2 hrs/day, 5 days/week) to address physical and functional deficits in the context of the above medical diagnosis(es)? Yes Addressing deficits in the following areas: balance, endurance, locomotion, strength, transferring, bowel/bladder control, bathing, dressing, feeding, grooming, toileting, cognition, speech, language, swallowing and psychosocial support 5. Can the patient actively participate in an intensive therapy program of at least 3 hrs of therapy per day at least 5 days per week? Yes and Potentially 6. The potential for patient to make measurable gains while on inpatient rehab is good 7. Anticipated functional outcomes upon discharge from inpatient rehab are supervision  with PT, supervision and min assist with OT, supervision and min assist with SLP. 8. Estimated rehab length of stay to reach the above functional goals is: potentially 18-24 days 9. Anticipated D/C setting: Home 10. Anticipated post D/C treatments: HH therapy 11. Overall Rehab/Functional Prognosis: good  RECOMMENDATIONS: This patient's condition is appropriate for continued rehabilitative care in the following setting: CIR Patient has agreed to participate in recommended program. N/A Note that insurance prior authorization may be required for reimbursement for recommended care.  Comment: Pt more lethargic today. Apparently didn't sleep at all last night. Need to normalize sleep pattern. Recommend a scheduled sleeping medication. Also need to maximize his stimulation/OOB during the day time and minimize the use of neuro-sedating medications. Follow up Head CT in the AM. Will be an appropriate candidate for CIR. Rehab Admissions Coordinator to follow up.  Thanks,  Earna Coder  Ermalene Postin, MD, FAAPMR    Charlton Amor., PA-C 06/25/2017

## 2017-06-25 NOTE — Progress Notes (Signed)
PT Cancellation Note  Patient Details Name: Randy Durham MRN: [id number] DOB: January 12, 1934   Cancelled Treatment:    Reason Eval/Treat Not Completed: Patient not medically ready. Pt currently on bedrest. Will continue to follow for updated activity orders prior to initiating PT evaluation. Thank you   Marylynn Pearson 06/25/2017, 7:14 AM   Conni Slipper, PT, DPT Acute Rehabilitation Services Pager: [phone]

## 2017-06-25 NOTE — Evaluation (Signed)
Clinical/Bedside Swallow Evaluation Patient Details  Name: Randy Durham MRN: [id number] Date of Birth: 1934/05/03  Today's Date: 06/25/2017 Time: SLP Start Time (ACUTE ONLY): 0900 SLP Stop Time (ACUTE ONLY): 0925 SLP Time Calculation (min) (ACUTE ONLY): 25 min  Past Medical History:  Past Medical History:  Diagnosis Date  . Anemia   . CAD (coronary artery disease)    Cath 03/26/2015 100% distal LAD stenosis, 80% ostial D1 stenosis, 75% ramus stenosis, 60% RPDA stenosis, 30% proximal RCA stenosis. Medical therapy, outpt myoview to assess LCx lesion  . Chronic kidney disease (CKD), stage III (moderate)   . Hyperlipidemia   . Hypertension   . Hypothyroidism   . Obesity (BMI 30-39.9)   . Spinal stenosis   . Thyroid nodule    Past Surgical History:  Past Surgical History:  Procedure Laterality Date  . CARDIAC CATHETERIZATION N/A 03/26/2015   Procedure: Left Heart Cath and Coronary Angiography;  Surgeon: Runell Gess, MD;  Location: [facility] INVASIVE CV LAB;  Service: Cardiovascular;  Laterality: N/A;  . PILONIDAL CYST EXCISION    . TONSILLECTOMY     HPI:  83 year old male with PMH of CAD, CKD, HLD, HTN, Hypothyroidism, Spinal Stenosis. Presents to ED on 8/21 with reported 12 hours of headache, nausea, and vomiting. CT of head revealed large right temporal IPH with IV extension and mild surrounding edema. Patient admitted to Neuro ICU. At 0200 while in ICU patient was taken for a STAT head CT for an acute neuro change in which revealed unchanged amount/size of IPH. Neurosurgery consulted and taken for craniotomy 8/22, intubated about 12 hours. CT on 8/22 shows Status post RIGHT craniotomy for temporoparietal hematoma evacuation. Local edema and small amount of residual intraparenchymal hemorrhage. No residual midline shift. Small RIGHT cerebellar tentorium subdural hematoma and trace subarachnoid hemorrhage, likely re- distributed.   Assessment / Plan / Recommendation Clinical Impression   Pt was able to participate in self-feeding and tolerated consecutive straw sips of more than 5oz thin liquid with no s/sx of aspiration. Pt was not presented with puree or solid consistencies due to nausea. The pt demonstrates adequate swallow function through his ability to tolerate thin liquids via straw, adequate mastication of ice, and no notable signs of weakness or coordination deficits while performing orofacial examination. Defer diet advancement to medical team as pts medical status improves. No SLP f/u needed, will sign off.  SLP Visit Diagnosis: Dysphagia, unspecified (R13.10)    Aspiration Risk  No limitations    Diet Recommendation Regular;Thin liquid   Liquid Administration via: Cup;Straw Medication Administration: Whole meds with liquid Supervision: Staff to assist with self feeding Compensations: Small sips/bites Postural Changes: Seated upright at 90 degrees    Other  Recommendations Oral Care Recommendations: Patient independent with oral care;Staff/trained caregiver to provide oral care   Follow up Recommendations None      Frequency and Duration            Prognosis Prognosis for Safe Diet Advancement: Good      Swallow Study   General HPI: 83 year old male with PMH of CAD, CKD, HLD, HTN, Hypothyroidism, Spinal Stenosis. Presents to ED on 8/21 with reported 12 hours of headache, nausea, and vomiting. CT of head revealed large right temporal IPH with IV extension and mild surrounding edema. Patient admitted to Neuro ICU. At 0200 while in ICU patient was taken for a STAT head CT for an acute neuro change in which revealed unchanged amount/size of IPH. Neurosurgery consulted and taken  for craniotomy 8/22, intubated about 12 hours. CT on 8/22 shows Status post RIGHT craniotomy for temporoparietal hematoma evacuation. Local edema and small amount of residual intraparenchymal hemorrhage. No residual midline shift. Small RIGHT cerebellar tentorium subdural hematoma and trace  subarachnoid hemorrhage, likely re- distributed. Type of Study: Bedside Swallow Evaluation Diet Prior to this Study: NPO Temperature Spikes Noted: No Respiratory Status: Room air History of Recent Intubation: Yes Length of Intubations (days): 1 days Date extubated: 06/24/17 Behavior/Cognition: Cooperative;Pleasant mood;Requires cueing;Confused Oral Cavity Assessment: Within Functional Limits Oral Care Completed by SLP: No Oral Cavity - Dentition: Dentures, top;Dentures, bottom Vision: Functional for self-feeding Self-Feeding Abilities: Able to feed self;Needs assist Patient Positioning: Upright in bed Baseline Vocal Quality: Normal Volitional Cough: Strong Volitional Swallow: Able to elicit    Oral/Motor/Sensory Function Overall Oral Motor/Sensory Function: Within functional limits   Ice Chips Ice chips: Within functional limits Presentation: Spoon   Thin Liquid Thin Liquid: Within functional limits Presentation: Straw;Cup;Self Fed    Nectar Thick Nectar Thick Liquid: Not tested   Honey Thick Honey Thick Liquid: Not tested   Puree Puree: Not tested Other Comments: DNT - pt feeling nauseated   Solid   GO   Solid: Not tested        Carmela Rima, Student SLP 06/25/2017,10:51 AM

## 2017-06-25 NOTE — Op Note (Signed)
06/23/2017 - 06/24/2017  10:56 AM  PATIENT:  Randy Durham  83 y.o. male presents with a large intratemporal hemorrhage on the right side with ventricular extension. I have recommended an emergent craniotomy to evacuate the hematoma to relieve the intracranial pressure.   PRE-OPERATIVE DIAGNOSIS:  right side intra temporal hematoma  POST-OPERATIVE DIAGNOSIS:  right side intra temporal hematoma  PROCEDURE:  Procedure(s): CRANIOTOMY right temporal for right intra temporal hematoma EVACUATION   SURGEON: Surgeon(s): Coletta Memos, MD Shirlean Kelly, MD  ASSISTANTS:Nudelman, Molly Maduro  ANESTHESIA:   general  EBL:  No intake/output data recorded.  BLOOD ADMINISTERED:none  CELL SAVER GIVEN:none  COUNT:per nursing  DRAINS: none   SPECIMEN:  No Specimen  DICTATION: Randy Durham was taken to the operating room, intubated, and placed under a general anesthetic without difficulty. His head was placed in a three pin [place] head holder once adequate anesthesia was obtained. I attached the head holder to the bed with his head turned towards the left and his body supine. l shaved and prepped his head. I infiltrated lidocaine into a linear coronal incision starting just behind and lower than the pinna extending medially , centered on the mastoid. I used Raney clips on the scalp and a cerebellar retractor to expose the skull. I made a burr hole laterally, then the craniotomy to turn a small craniotomy. I opened the dura in a cruciate manner and exposed the temporal lobe. I cauterized then made a small opening in the brain using suction. I used the suction to then find the hematoma and sucked it out of the cavity it had formed. I did enter the temporal horn of the lateral ventricle. I obtained hemostasis with gelfoam, irrigation, cautery, and time. Once good hemostasis was obtained I and Dr. Newell Coral  Closed the wound. I used a dural onlay to place over the temporal opening, approximated the bone  flap with plates and screws, and the galea with vicryl sutures and the scalp edges with staples. I applied a sterile dressing. I removed the head holder. He was taken straight to the Neuro ICU for his postoperative care intubated.   PLAN OF CARE: Admit to inpatient   PATIENT DISPOSITION:  PACU - hemodynamically stable.   Delay start of Pharmacological VTE agent (>24hrs) due to surgical blood loss or risk of bleeding:  yes

## 2017-06-26 DIAGNOSIS — G936 Cerebral edema: Secondary | ICD-10-CM

## 2017-06-26 LAB — BASIC METABOLIC PANEL
ANION GAP: 7 (ref 5–15)
BUN: 17 mg/dL (ref 6–20)
CALCIUM: 8.5 mg/dL — AB (ref 8.9–10.3)
CO2: 23 mmol/L (ref 22–32)
Chloride: 105 mmol/L (ref 101–111)
Creatinine, Ser: 0.94 mg/dL (ref 0.61–1.24)
GFR calc Af Amer: >60 mL/min (ref >60)
GFR calc non Af Amer: >60 mL/min (ref >60)
GLUCOSE: 85 mg/dL (ref 65–99)
Potassium: 4 mmol/L (ref 3.5–5.1)
Sodium: 135 mmol/L (ref 135–145)

## 2017-06-26 LAB — CBC
HEMATOCRIT: 37.8 % — AB (ref 39.0–52.0)
Hemoglobin: 12.5 g/dL — ABNORMAL LOW (ref 13.0–17.0)
MCH: 28.5 pg (ref 26.0–34.0)
MCHC: 33.1 g/dL (ref 30.0–36.0)
MCV: 86.1 fL (ref 78.0–100.0)
Platelets: 156 10*3/uL (ref 150–400)
RBC: 4.39 MIL/uL (ref 4.22–5.81)
RDW: 13.4 % (ref 11.5–15.5)
WBC: 8.4 10*3/uL (ref 4.0–10.5)

## 2017-06-26 MED ORDER — ALUM & MAG HYDROXIDE-SIMETH 200-200-20 MG/5ML PO SUSP
15.0000 mL | Freq: Four times a day (QID) | ORAL | Status: DC | PRN
  Administered 2017-06-26: 15 mL via ORAL
  Filled 2017-06-26: qty 30

## 2017-06-26 MED ORDER — ASPIRIN 81 MG PO CHEW
81.0000 mg | CHEWABLE_TABLET | Freq: Every day | ORAL | Status: DC
  Administered 2017-06-27 – 2017-06-30 (×4): 81 mg via ORAL
  Filled 2017-06-26 (×4): qty 1

## 2017-06-26 MED ORDER — PANTOPRAZOLE SODIUM 40 MG PO TBEC
40.0000 mg | DELAYED_RELEASE_TABLET | Freq: Every day | ORAL | Status: DC
  Administered 2017-06-26 – 2017-06-29 (×5): 40 mg via ORAL
  Filled 2017-06-26 (×5): qty 1

## 2017-06-26 NOTE — Progress Notes (Signed)
  Speech Language Pathology Treatment: Cognitive-Linquistic  Patient Details Name: Randy Durham MRN: [id number] DOB: [birth year] Today's Date: 06/26/2017 Time: 7048-8891 SLP Time Calculation (min) (ACUTE ONLY): 24 min  Assessment / Plan / Recommendation Clinical Impression  Pt less attentive and lart today, struggled with delirium and restlessness overnight, did not sleep per son. Pt arousable to verbal and tactile cues, but only opened eye x1 for 10 seconds. During functional tasks (oral care, placement of dentures, self feeding) pt typically required total assit with brief periods of participation and mod assist. Pt occasional responded "Yes" but otherwise non communicative. Recommend staff facilitate quiet rest at night if possible to allow increased arousal and participation during the day. Discussed with son and Charity fundraiser.   HPI HPI: Randy Durham with PMH of CAD, CKD, HLD, HTN, Hypothyroidism, Spinal Stenosis. Presents to ED on 8/21 with reported 12 hours of headache, nausea, and vomiting. CT of head revealed large right temporal IPH with IV extension and mild surrounding edema. Patient admitted to Neuro ICU. At 0200 while in ICU patient was taken for a STAT head CT for an acute neuro change in which revealed unchanged amount/size of IPH. Neurosurgery consulted and taken for craniotomy 8/22, intubated about 12 hours. CT on 8/22 shows Status post RIGHT craniotomy for temporoparietal hematoma evacuation. Local edema and small amount of residual intraparenchymal hemorrhage. No residual midline shift. Small RIGHT cerebellar tentorium subdural hematoma and trace subarachnoid hemorrhage, likely re- distributed.      SLP Plan  Continue with current plan of care       Recommendations                   General recommendations: Rehab consult Oral Care Recommendations: Oral care BID Follow up Recommendations: Inpatient Rehab SLP Visit Diagnosis: Attention and concentration deficit Attention and  concentration deficit following: Nontraumatic intracerebral hemorrhage Plan: Continue with current plan of care       GO               [facility], MA CCC-SLP [phone]  Claudine Mouton 06/26/2017, 9:31 AM

## 2017-06-26 NOTE — Consult Note (Signed)
[facility] CM Primary Care Navigator  06/26/2017  Randy Durham [birth year] [id number]   Went to see patient and met with wife Randy Durham), daughter Randy Durham) and son Randy Durham) at the bedside to identify possible discharge needs. Patient is sitting up on the recliner with eyes maintained close and was unable to carry conversation.  Wife reports that patient had complained of persistent headache with pain to right eye; very disoriented and had nausea/ vomiting that led to this admission/ surgery (craniotomy).  Wifeendorses Dr. Seward Carol with [facility] as patient's primary care provider.  [facility] [address] [facility].   Patient has been managinghis own medications at home prior to admission/ surgery but wife will continue medication management with use of weekly "pill box" as stated.  Patient has been driving up until this admission. Daughter (lives a mile away) will be providing transportation to hisdoctors'appointments after discharge.  Patient had been very independent before this hospitalization per family report. Wife and children will be his primary caregivers at home.  Patient's RN reports that plan is todischarge him to [facility] ([facility]) per therapy recommendation.  Patient's familyvoiced understanding to call primary care provider's officewhen hereturns backhome,for a post discharge follow-up within a week or sooner if needs arise.Patient letter (with PCP's contact number) was provided as theirreminder.   Discussed with patient and family regarding [facility] CM services available for health managementat home but communicated no needs or concerns at this present time.  They expressed understanding to seekreferral  from primary care provider to [facility] care management asdeemed necessary and appropriatefor services in the future (once he returns home).  [facility] care  management information was provided for future needs that he may have.   For questions, please contact:  Dannielle Huh, BSN, RN- [facility] Primary Care Navigator  Telephone: [phone] [place]

## 2017-06-26 NOTE — Evaluation (Signed)
Physical Therapy Evaluation Patient Details Name: Randy Durham MRN: [id number] DOB: 1934/01/15 Today's Date: 06/26/2017   History of Present Illness  Pt is an 83 year old male with PMH of CAD, CKD, HLD, HTN, Hypothyroidism, Spinal Stenosis. Presents to ED on 8/21 with reported 12 hours of headache, nausea, and vomiting. CT of head revealed large right temporal IPH with IV extension and mild surrounding edema. Patient admitted to Neuro ICU. At 0200 while in ICU patient was taken for a STAT head CT for an acute neuro change in which revealed unchanged amount/size of IPH. Neurosurgery consulted and taken for craniotomy 8/22, intubated about 12 hours. CT on 8/22 shows Status post RIGHT craniotomy for temporoparietal hematoma evacuation. Local edema and small amount of residual intraparenchymal hemorrhage. No residual midline shift. Small RIGHT cerebellar tentorium subdural hematoma and trace subarachnoid hemorrhage, likely re- distributed. pt has a left homonymous hemianopia post-op.  Clinical Impression  Pt presented supine in bed with HOB elevated, awake but maintaining eyes closed and willing to participate in therapy session. Pt's daughter was present throughout session. Prior to admission, pt's daughter reported that pt was very independent with all functional mobility and ADLs. Pt currently requires heavy two person physical assistance for bed mobility and transfers. Pt is very limited secondary to lethargy, weakness and cognitive deficits listed below. Pt is an excellent candidate for Inpatient Rehab for further intensive therapy services. PT will continue to follow acutely for mobility progression.    Follow Up Recommendations CIR;Supervision/Assistance - 24 hour    Equipment Recommendations  None recommended by PT    Recommendations for Other Services Rehab consult     Precautions / Restrictions Precautions Precautions: Fall Restrictions Weight Bearing Restrictions: No       Mobility  Bed Mobility Overal bed mobility: Needs Assistance Bed Mobility: Supine to Sit     Supine to sit: +2 for physical assistance;Total assist     General bed mobility comments: total A x2 for all aspects  Transfers Overall transfer level: Needs assistance Equipment used: 2 person hand held assist Transfers: Sit to/from Stand;Stand Pivot Transfers Sit to Stand: Max assist;+2 physical assistance Stand pivot transfers: Total assist;+2 physical assistance       General transfer comment: increased time, max A x2 with use of gait belt and bed pad to achieving standing position from sitting EOB. pt performed sit<>stand x2 from bed. Total A x2 for pivotal movement to chair.  Ambulation/Gait                Stairs            Wheelchair Mobility    Modified Rankin (Stroke Patients Only) Modified Rankin (Stroke Patients Only) Pre-Morbid Rankin Score: No symptoms Modified Rankin: Severe disability     Balance Overall balance assessment: Needs assistance Sitting-balance support: Feet supported;Bilateral upper extremity supported Sitting balance-Leahy Scale: Poor Sitting balance - Comments: consistent L lateral lean; progressing from max A to close min guard for brief periods of time Postural control: Left lateral lean Standing balance support: During functional activity Standing balance-Leahy Scale: Zero Standing balance comment: max-total A x2                             Pertinent Vitals/Pain Pain Assessment: Faces Faces Pain Scale: Hurts even more Pain Location: head Pain Intervention(s): Monitored during session;Repositioned    Home Living Family/patient expects to be discharged to:: Inpatient rehab  Additional Comments: Pt's family is very interested in CIR    Prior Function Level of Independence: Independent               Hand Dominance   Dominant Hand: Right    Extremity/Trunk Assessment   Upper  Extremity Assessment Upper Extremity Assessment: Defer to OT evaluation    Lower Extremity Assessment Lower Extremity Assessment: Generalized weakness       Communication   Communication: No difficulties  Cognition Arousal/Alertness: Lethargic (kept eyes closed most of the time) Behavior During Therapy: WFL for tasks assessed/performed Overall Cognitive Status: Impaired/Different from baseline Area of Impairment: Orientation;Attention;Following commands;Safety/judgement;Problem solving                 Orientation Level: Disoriented to;Situation;Time Current Attention Level: Focused   Following Commands: Follows one step commands with increased time;Follows one step commands inconsistently Safety/Judgement: Decreased awareness of safety;Decreased awareness of deficits   Problem Solving: Slow processing;Decreased initiation;Difficulty sequencing;Requires verbal cues;Requires tactile cues General Comments: pt with very slow processing and delayed responses      General Comments      Exercises     Assessment/Plan    PT Assessment Patient needs continued PT services  PT Problem List Decreased strength;Decreased activity tolerance;Decreased balance;Decreased mobility;Decreased coordination;Decreased cognition;Decreased knowledge of use of DME;Decreased safety awareness;Decreased knowledge of precautions;Pain       PT Treatment Interventions DME instruction;Gait training;Stair training;Functional mobility training;Therapeutic activities;Therapeutic exercise;Balance training;Neuromuscular re-education;Cognitive remediation;Patient/family education    PT Goals (Current goals can be found in the Care Plan section)  Acute Rehab PT Goals Patient Stated Goal: return to PLOF PT Goal Formulation: With patient/family Time For Goal Achievement: 07/10/17 Potential to Achieve Goals: Good    Frequency Min 4X/week   Barriers to discharge        Co-evaluation PT/OT/SLP  Co-Evaluation/Treatment: Yes Reason for Co-Treatment: Complexity of the patient's impairments (multi-system involvement);Necessary to address cognition/behavior during functional activity;For patient/therapist safety;To address functional/ADL transfers PT goals addressed during session: Mobility/safety with mobility;Balance         AM-PAC PT "6 Clicks" Daily Activity  Outcome Measure Difficulty turning over in bed (including adjusting bedclothes, sheets and blankets)?: Unable Difficulty moving from lying on back to sitting on the side of the bed? : Unable Difficulty sitting down on and standing up from a chair with arms (e.g., wheelchair, bedside commode, etc,.)?: Unable Help needed moving to and from a bed to chair (including a wheelchair)?: A Lot Help needed walking in hospital room?: Total Help needed climbing 3-5 steps with a railing? : Total 6 Click Score: 7    End of Session Equipment Utilized During Treatment: Gait belt Activity Tolerance: Patient limited by lethargy;Patient limited by fatigue Patient left: in chair;with call bell/phone within reach;with family/visitor present Nurse Communication: Mobility status;Need for lift equipment PT Visit Diagnosis: Other abnormalities of gait and mobility (R26.89);Other symptoms and signs involving the nervous system (R29.898)    Time: 1355-1441 PT Time Calculation (min) (ACUTE ONLY): 46 min   Charges:   PT Evaluation $PT Eval Moderate Complexity: 1 Mod     PT G Codes:        [place], PT, DPT [phone]   Alessandra Bevels Santosh Petter 06/26/2017, 4:06 PM

## 2017-06-26 NOTE — Progress Notes (Signed)
Patient ID: Randy Durham, male   DOB: [birth year], 83 y.o.   MRN: [id number] BP 101/66 (BP Location: Left Arm)   Pulse 96   Temp 98.3 F (36.8 C) (Oral)   Resp 17   Ht 5\' 3"  (1.6 m)   Wt 81.5 kg (179 lb 10.8 oz)   SpO2 96%   BMI 31.83 kg/m  Alert, following some commands Moving all extremities Wound is clean,dry, no signs of infection Aspirin ok to start, 81mg 

## 2017-06-26 NOTE — Progress Notes (Signed)
STROKE TEAM PROGRESS NOTE   SUBJECTIVE (INTERVAL HISTORY) His  son is at the bedside.  The patient was up all night and is sleepy this am.he does not complain of headache or nausea today OBJECTIVE Temp:  [98.1 F (36.7 C)-100.8 F (38.2 C)] 99.5 F (37.5 C) (08/24 0958) Pulse Rate:  [46-100] 97 (08/24 0958) Cardiac Rhythm: Atrial fibrillation (08/24 0700) Resp:  [11-27] 20 (08/24 0958) BP: (87-129)/(38-90) 129/70 (08/24 0958) SpO2:  [94 %-99 %] 98 % (08/24 0958)  CBC:   Recent Labs Lab 06/25/17 0412 06/26/17 1016  WBC 11.1* 8.4  HGB 11.5* 12.5*  HCT 35.8* 37.8*  MCV 85.6 86.1  PLT 173 156    Basic Metabolic Panel:   Recent Labs Lab 06/24/17 0930 06/25/17 0412 06/26/17 1016  NA 137 135 135  K 4.1 3.8 4.0  CL 104 104 105  CO2 22 25 23   GLUCOSE 103* 110* 85  BUN 15 13 17   CREATININE 1.07 0.99 0.94  CALCIUM 9.4 7.9* 8.5*  MG 1.9 1.7  --   PHOS 3.6 2.7  --     Lipid Panel:     Component Value Date/Time   CHOL 131 05/10/2015 1039   TRIG 101 06/24/2017 0930   HDL 45.00 05/10/2015 1039   CHOLHDL 3 05/10/2015 1039   VLDL 16.0 05/10/2015 1039   LDLCALC 70 05/10/2015 1039   HgbA1c:  Lab Results  Component Value Date   HGBA1C 5.6 06/24/2017   Urine Drug Screen: No results found for: LABOPIA, COCAINSCRNUR, LABBENZ, AMPHETMU, THCU, LABBARB  Alcohol Level No results found for: ETH  IMAGING  Ct Angio Head W Or Wo Contrast Ct Angio Neck W Or Wo Contrast 06/24/2017   IMPRESSION: 1. Unchanged volume of intraparenchymal hemorrhage in the right temporal lobe. Unchanged intraventricular extension and ventricular configuration. 2. No emergent large vessel occlusion. Poor visualization of the origin of the right posterior cerebral artery, which probably arises from a small right posterior communicating artery. The remainder of the visible PCA is normal. 3. No vascular abnormality to account for the intraparenchymal hemorrhage. In a patient of this age, lobar parenchymal  hemorrhage may be secondary to underlying amyloid angiopathy for hypertension. 4. Carotid bifurcation and Aortic Atherosclerosis (ICD10-I70.0) without hemodynamically significant stenosis.  Ct Head Wo Contrast 06/24/2017 IMPRESSION: Large right temporal intraparenchymal hemorrhage with intraventricular extension, mild surrounding edema. Hemorrhage fills the right lateral ventricle and tracks into the third, fourth, and left lateral ventricles. The basilar cisterns remain patent, there is no hydrocephalus.    06/25/2017 IMPRESSION: 1. Status post RIGHT craniotomy for temporoparietal hematoma evacuation. Local edema and small amount of residual intraparenchymal hemorrhage. No residual midline shift.  2. Small RIGHT cerebellar tentorium subdural hematoma and trace subarachnoid hemorrhage, likely re- distributed.  3. Intraventricular blood products without hydrocephalus.  [name] Wo Contrast 06/24/2017 IMPRESSION: 1. Postsurgical changes from interval right temporal hematoma evacuation. 2. Small volume acute ischemia just posterior to the surgical cavity. 3. Decreased intraventricular blood products. 4. Small right and tiny left subdural fluid collections without significant mass effect. 5. Scattered bilateral sulcal FLAIR signal abnormality which may reflect a combination of small volume subarachnoid blood products and artifact.  Dg Chest Port 1 View 06/24/2017 IMPRESSION: 1. Left jugular catheter as above, likely in the left brachiocephalic vein though clinical or blood gas correlation is recommended to exclude arterial placement. 2. Minimal left basilar atelectasis.  06/25/2017 IMPRESSION: 1.  Left IJ line in stable position. 2.  Stable cardiomegaly. 3. Low lung volumes.  Mild basilar atelectasis. Mild left base infiltrate cannot be excluded on today's exam.    PHYSICAL EXAM Elderly obese Caucasian male who is currently is  Not in distress . Afebrile. Head is nontraumatic. Neck is supple  without bruit.    Cardiac exam no murmur or gallop. Lungs are clear to auscultation. Distal pulses are well felt. Neurological Exam ; lethargic Eyes open partially to sternal rub.right gaze preference and partial left gaze palsy. Left hemianopsia. Mild right left confusion.  .  Able to move all 4 extremity   against gravity but left side less than the right. Pupils small irregular but reactive. Fundi could not be visualized. Mild left lower facial weakness. Tongue midline. Tone appears to be normal bilaterally. Both plantars are downgoing.  ASSESSMENT/PLAN Mr. Eliceo Mogul is a 83 y.o. male with history of CAD, CKD, HLD, HTN, hypothyroidism and spinal stenosis who presented to the ED with somnolence and headache since Tuesday morning.  He did not receive IV t-PA due to ICH.   Stroke: Large R temporal IPH with extension to R lateral, 3rd, and 4th ventricle with mild associated cytotoxic edema, and no hydrocephalus, in the setting of moderate hypertension and anticoagulation with Eliquis and aspirin, and possible amyloid angiopathy. Status post craniotomy for hematoma evacuation on 06/24/17  Resultant  left hemiparesis and left hemianopsia  CT head: Large right temporal intraparenchymal hemorrhage with intraventricular extension, mild surrounding edema. Hemorrhage fills the right lateral ventricle and tracks into the third, fourth, and left lateral ventricles.  MRI head (POD#0). Small volume acute ischemia posterior to surgical cavity, small right and tiny left subdural fluid collections without significant mass effect, small volume subarachnoid blood products versus artifact.  MRA head: not ordered  CTA head/neck: No vascular abnormality to account for the intraparenchymal hemorrhage  2D Echo: not ordered   LDL 70  HgbA1c 5.6  SCDs for VTE prophylaxis Diet clear liquid Room service appropriate? Yes; Fluid consistency: Thin  aspirin 81 mg daily and Eliquis (apixaban) daily prior to  admission, now on No antithrombotic  Patient counseled to be compliant with his antithrombotic medications  Ongoing aggressive stroke risk factor management  Therapy recommendations:   pending  Disposition:   pending  Hypertension  Stable  Off clevidipine  Added lisinopril and HCTZ Goal SBP < 140 for first 24-hours post-hemorrhage Long-term BP goal normotensive  Hyperlipidemia  Home meds: atorvastatin 80 mg PO daily  LDL 70, goal < 70  Resume statin once ICH is stable  Continue statin at discharge  Other Stroke Risk Factors  Advanced age  ETOH use, advised to drink no more than 2 drink(s) a day  Obesity, Body mass index is 31.83 kg/m., recommend weight loss, diet and exercise as appropriate   Other Active Problems  Headache: Loaded with 1g phenytoin IV, then 500 mg PO BID for headache and seizure prophylaxis in setting of IPH; PRN dilaudid  Hospital day # 2   Plan  Regularize sleep wake cycle. Mobilize out of bed. Therapy and rehabilitation consults. Check repeat BMP CBC and CT scan tomorrow morning. Will start aspirin 81 mg if no significant hemorrhage on CT scan. Discussed with son and answered questions. Greater than 50% time during this 35 minute visit was spent on counseling and coordination of care about his intracerebral hemorrhage, atrial fibrillation stroke prevention discussion      Delia Heady, MD Medical Director [facility] Pager: [phone] 06/26/2017 11:30 AM   To contact Stroke Continuity provider, please refer to [email or web address].  After hours, contact General Neurology

## 2017-06-26 NOTE — Progress Notes (Signed)
Patient is has been sleeping all morning. Son states that patient did not sleep all night. PT states his name with eyes closed and back to sleep. PT unable to identify where he is-he was able to state his first name only. Wife, daughter and son at his bedside.

## 2017-06-26 NOTE — Evaluation (Signed)
Occupational Therapy Evaluation Patient Details Name: Randy Durham MRN: [id number] DOB: [birth year] Today's Date: 06/26/2017    History of Present Illness Pt is an 83 year old male with PMH of CAD, CKD, HLD, HTN, Hypothyroidism, Spinal Stenosis. Presents to ED on 8/21 with reported 12 hours of headache, nausea, and vomiting. CT of head revealed large right temporal IPH with IV extension and mild surrounding edema. Patient admitted to Neuro ICU. At 0200 while in ICU patient was taken for a STAT head CT for an acute neuro change in which revealed unchanged amount/size of IPH. Neurosurgery consulted and taken for craniotomy 8/22, intubated about 12 hours. CT on 8/22 shows Status post RIGHT craniotomy for temporoparietal hematoma evacuation. Local edema and small amount of residual intraparenchymal hemorrhage. No residual midline shift. Small RIGHT cerebellar tentorium subdural hematoma and trace subarachnoid hemorrhage, likely re- distributed. pt has a left homonymous hemianopia post-op.   Clinical Impression   PTA Pt independent in ADL and mobility. Pt is currently max A to total A for all ADL and demonstrating severely delayed processing as well as other cognitive deficits that could be impacting performance. For example, Pt could not open eyes long enough for visual assessment, even for a functional task. Pt is max A +2 for stand pivot to recliner at this time. Pt will require skilled OT in the acute setting and comprehensive inpatient rehab to maximize safety and independence in ADL, visual compensatory strategies, and functional transfers. Next session to look at vision, and work on seated balance for ADL activities.     Follow Up Recommendations  CIR;Supervision/Assistance - 24 hour    Equipment Recommendations  Other (comment) (defer to next venue)    Recommendations for Other Services Rehab consult     Precautions / Restrictions Precautions Precautions: Fall Restrictions Weight Bearing  Restrictions: No      Mobility Bed Mobility Overal bed mobility: Needs Assistance Bed Mobility: Supine to Sit     Supine to sit: +2 for physical assistance;Total assist     General bed mobility comments: total A x2 for all aspects  Transfers Overall transfer level: Needs assistance Equipment used: 2 person hand held assist Transfers: Sit to/from Stand;Stand Pivot Transfers Sit to Stand: Max assist;+2 physical assistance Stand pivot transfers: Total assist;+2 physical assistance       General transfer comment: increased time, max A x2 with use of gait belt and bed pad to achieving standing position from sitting EOB. pt performed sit<>stand x2 from bed. Total A x2 for pivotal movement to chair.    Balance Overall balance assessment: Needs assistance Sitting-balance support: Feet supported;Bilateral upper extremity supported Sitting balance-Leahy Scale: Poor Sitting balance - Comments: consistent L lateral lean; progressing from max A to close min guard for brief periods of time Postural control: Left lateral lean Standing balance support: During functional activity Standing balance-Leahy Scale: Zero Standing balance comment: max-total A x2                           ADL either performed or assessed with clinical judgement   ADL Overall ADL's : Needs assistance/impaired                                       General ADL Comments: Pt is max A hand over hand to total A for all ADL     Vision Patient Visual Report:  Other (comment) (Pt would not keep eyes open during session today) Vision Assessment?: Vision impaired- to be further tested in functional context Additional Comments: Attempted to perform visual assessment, Pt unable to hold eyes open.     Perception     Praxis      Pertinent Vitals/Pain Pain Assessment: Faces Faces Pain Scale: Hurts even more Pain Location: head Pain Intervention(s): Monitored during session;Repositioned      Hand Dominance Right   Extremity/Trunk Assessment Upper Extremity Assessment Upper Extremity Assessment: Generalized weakness;Difficult to assess due to impaired cognition   Lower Extremity Assessment Lower Extremity Assessment: Defer to PT evaluation   Cervical / Trunk Assessment Cervical / Trunk Assessment: Other exceptions Cervical / Trunk Exceptions: history of spinal stenosis   Communication Communication Communication: No difficulties   Cognition Arousal/Alertness: Lethargic (kept eyes closed most of the time) Behavior During Therapy: WFL for tasks assessed/performed Overall Cognitive Status: Impaired/Different from baseline Area of Impairment: Orientation;Attention;Following commands;Safety/judgement;Problem solving                 Orientation Level: Disoriented to;Situation;Time Current Attention Level: Focused   Following Commands: Follows one step commands with increased time;Follows one step commands inconsistently Safety/Judgement: Decreased awareness of safety;Decreased awareness of deficits   Problem Solving: Slow processing;Decreased initiation;Difficulty sequencing;Requires verbal cues;Requires tactile cues General Comments: pt with very slow processing and delayed responses   General Comments  Pt has very supportive family. Daughter lives very close for assist after discharge    Exercises     Shoulder Instructions      Home Living Family/patient expects to be discharged to:: Inpatient rehab                                 Additional Comments: Pt's family is very interested in CIR      Prior Functioning/Environment Level of Independence: Independent                 OT Problem List: Decreased strength;Decreased range of motion;Impaired balance (sitting and/or standing);Impaired vision/perception;Decreased coordination;Decreased cognition;Decreased safety awareness;Decreased knowledge of use of DME or AE;Decreased  knowledge of precautions;Pain      OT Treatment/Interventions: Self-care/ADL training;Therapeutic exercise;Neuromuscular education;Energy conservation;DME and/or AE instruction;Therapeutic activities;Cognitive remediation/compensation;Visual/perceptual remediation/compensation;Patient/family education;Balance training    OT Goals(Current goals can be found in the care plan section) Acute Rehab OT Goals Patient Stated Goal: get back to playing golf OT Goal Formulation: With patient/family Time For Goal Achievement: 07/10/17 Potential to Achieve Goals: Good ADL Goals Pt Will Perform Grooming: with supervision;sitting Pt Will Perform Upper Body Bathing: with supervision;sitting Pt Will Perform Lower Body Bathing: with min guard assist;with caregiver independent in assisting;sitting/lateral leans Pt Will Transfer to Toilet: with mod assist;stand pivot transfer;bedside commode Pt Will Perform Toileting - Clothing Manipulation and hygiene: with min guard assist;sit to/from stand;with caregiver independent in assisting Additional ADL Goal #1: Pt will perform bed mobility at supervision level prior to particpating in ADL activity  OT Frequency: Min 3X/week   Barriers to D/C:            Co-evaluation PT/OT/SLP Co-Evaluation/Treatment: Yes Reason for Co-Treatment: Complexity of the patient's impairments (multi-system involvement);For patient/therapist safety;To address functional/ADL transfers PT goals addressed during session: Mobility/safety with mobility;Balance OT goals addressed during session: ADL's and self-care      AM-PAC PT "6 Clicks" Daily Activity     Outcome Measure Help from another person eating meals?: A Lot Help from another person taking care  of personal grooming?: A Lot Help from another person toileting, which includes using toliet, bedpan, or urinal?: Total Help from another person bathing (including washing, rinsing, drying)?: Total Help from another person to put on  and taking off regular upper body clothing?: Total Help from another person to put on and taking off regular lower body clothing?: Total 6 Click Score: 8   End of Session Equipment Utilized During Treatment: Gait belt Nurse Communication: Mobility status;Need for lift equipment;Precautions;Other (comment) (suggest new IV site?)  Activity Tolerance: Patient tolerated treatment well Patient left: in chair;with call bell/phone within reach;with chair alarm set;with family/visitor present  OT Visit Diagnosis: Unsteadiness on feet (R26.81);Other abnormalities of gait and mobility (R26.89);Muscle weakness (generalized) (M62.81);Low vision, both eyes (H54.2);Other symptoms and signs involving the nervous system (R29.898);Other symptoms and signs involving cognitive function                Time: 1355-1444 OT Time Calculation (min): 49 min Charges:  OT General Charges $OT Visit: 1 Procedure OT Evaluation $OT Eval Moderate Complexity: 1 Procedure OT Treatments $Self Care/Home Management : 8-22 mins G-Codes:     Sherryl Manges OTR/L [phone] Evern Bio Courteny Egler 06/26/2017, 5:48 PM

## 2017-06-26 NOTE — Progress Notes (Signed)
I met with pt's spouse, son and daughter at bedside to begin discussions concerning a possible inpt rehab admission pending his ability to participate with therapy. Therapy eval's are pending. I will follow up on Monday. [phone]

## 2017-06-27 ENCOUNTER — Inpatient Hospital Stay (HOSPITAL_COMMUNITY): Payer: Medicare Other

## 2017-06-27 ENCOUNTER — Encounter (HOSPITAL_COMMUNITY): Payer: Self-pay

## 2017-06-27 MED ORDER — ASPIRIN EC 81 MG PO TBEC: 81.0000 mg | DELAYED_RELEASE_TABLET | Freq: Every day | ORAL | Status: DC

## 2017-06-27 NOTE — Progress Notes (Signed)
Patient ID: Randy Durham, male   DOB: Nov 10, 1933, 83 y.o.   MRN: [id number] Patient doing well improved alertness  Moves all extremities well incision clean dry and intact intermittently follows commands  Continue to mobilize with physical therapy

## 2017-06-27 NOTE — Progress Notes (Signed)
Physical Therapy Treatment Patient Details Name: Randy Durham MRN: [id number] DOB: 25-May-1934 Today's Date: 06/27/2017    History of Present Illness Pt is an 83 year old male with PMH of CAD, CKD, HLD, HTN, Hypothyroidism, Spinal Stenosis. Presents to ED on 8/21 with reported 12 hours of headache, nausea, and vomiting. CT of head revealed large right temporal IPH with IV extension and mild surrounding edema. Patient admitted to Neuro ICU. At 0200 while in ICU patient was taken for a STAT head CT for an acute neuro change in which revealed unchanged amount/size of IPH. Neurosurgery consulted and taken for craniotomy 8/22, intubated about 12 hours. CT on 8/22 shows Status post RIGHT craniotomy for temporoparietal hematoma evacuation. Local edema and small amount of residual intraparenchymal hemorrhage. No residual midline shift. Small RIGHT cerebellar tentorium subdural hematoma and trace subarachnoid hemorrhage, likely re- distributed. pt has a left homonymous hemianopia post-op.    PT Comments    Pt is demonstrating R side neglect but could wgt shift over to his R side with max assist as he is resisting the effort.  Pt is getting up to side of bed with max to min assist to control balance, and varies with his attention to the task.  He is so L side leaning that R side lean was done to correct midline.  He is overall finally unable to stand enough to get to chair, and was assisted back to bed with PT and CNA.  He was assisting less than yesterday per note, but was still demonstrating active effort and can be expected to want to continue on with his rehab.  Talked with family about the continuum of rehab care to increase awareness of the path.     Follow Up Recommendations  CIR     Equipment Recommendations  None recommended by PT    Recommendations for Other Services Rehab consult     Precautions / Restrictions Precautions Precautions: Fall (craniotomy incision R side of   head) Restrictions Weight Bearing Restrictions: No Other Position/Activity Restrictions: supine to side to sit slowly due to symptoms    Mobility  Bed Mobility Overal bed mobility: Needs Assistance Bed Mobility: Supine to Sit;Sit to Supine     Supine to sit: Max assist Sit to supine: Total assist      Transfers Overall transfer level: Needs assistance Equipment used: Rolling walker (2 wheeled);2 person hand held assist Transfers: Sit to/from Stand Sit to Stand: Max assist;Total assist;+2 physical assistance;+2 safety/equipment;From elevated surface Stand pivot transfers: From elevated surface (attempted but pt did not support the effort with LE's)          Ambulation/Gait             General Gait Details: unable   Stairs            Wheelchair Mobility    Modified Rankin (Stroke Patients Only) Modified Rankin (Stroke Patients Only) Pre-Morbid Rankin Score: No symptoms Modified Rankin: Severe disability     Balance Overall balance assessment: Needs assistance Sitting-balance support: Feet supported;Bilateral upper extremity supported Sitting balance-Leahy Scale: Poor Sitting balance - Comments: can control his sit posture for short times then lists to L mainly Postural control: Left lateral lean Standing balance support: During functional activity;Bilateral upper extremity supported Standing balance-Leahy Scale: Zero                              Cognition Arousal/Alertness: Lethargic Behavior During Therapy: [facility] for tasks  assessed/performed Overall Cognitive Status: Impaired/Different from baseline Area of Impairment: Orientation;Attention;Memory;Following commands;Safety/judgement;Awareness;Problem solving                 Orientation Level: Time;Situation Current Attention Level: Selective Memory: Decreased short-term memory;Decreased recall of precautions Following Commands: Follows one step commands with increased  time;Follows one step commands inconsistently Safety/Judgement: Decreased awareness of safety;Decreased awareness of deficits Awareness: Intellectual Problem Solving: Slow processing;Decreased initiation;Difficulty sequencing;Requires verbal cues;Requires tactile cues General Comments: requires repositioning continually with sitting balance due to his leaning to L then to R      Exercises      General Comments        Pertinent Vitals/Pain Pain Assessment: Faces Faces Pain Scale: Hurts even more Pain Location: head and muscles  Pain Descriptors / Indicators: Operative site guarding;Tightness Pain Intervention(s): Limited activity within patient's tolerance;Monitored during session;Repositioned    Home Living                      Prior Function            PT Goals (current goals can now be found in the care plan section) Acute Rehab PT Goals PT Goal Formulation: With patient/family Progress towards PT goals: Progressing toward goals    Frequency    Min 4X/week      PT Plan Current plan remains appropriate    Co-evaluation              AM-PAC PT "6 Clicks" Daily Activity  Outcome Measure  Difficulty turning over in bed (including adjusting bedclothes, sheets and blankets)?: Unable Difficulty moving from lying on back to sitting on the side of the bed? : Unable Difficulty sitting down on and standing up from a chair with arms (e.g., wheelchair, bedside commode, etc,.)?: Unable Help needed moving to and from a bed to chair (including a wheelchair)?: A Lot Help needed walking in hospital room?: Total Help needed climbing 3-5 steps with a railing? : Total 6 Click Score: 7    End of Session Equipment Utilized During Treatment: Gait belt Activity Tolerance: Patient limited by lethargy;Patient limited by fatigue Patient left: in bed;with call bell/phone within reach;with nursing/sitter in room;with family/visitor present;with bed alarm set Nurse  Communication: Mobility status;Need for lift equipment PT Visit Diagnosis: Other abnormalities of gait and mobility (R26.89);Other symptoms and signs involving the nervous system (R29.898)     Time: 0375-4360 PT Time Calculation (min) (ACUTE ONLY): 39 min  Charges:  $Therapeutic Activity: 23-37 mins $Neuromuscular Re-education: 8-22 mins                    G Codes:  Functional Assessment Tool Used: AM-PAC 6 Clicks Basic Mobility     Ivar Drape 06/27/2017, 1:08 PM   Samul Dada, PT MS Acute Rehab Dept. Number: [facility] [id number] and [facility] [phone]

## 2017-06-27 NOTE — Progress Notes (Signed)
STROKE TEAM PROGRESS NOTE   SUBJECTIVE (INTERVAL HISTORY) His  son is at the bedside.  The patient looks better today and slept better and.he does not complain of headache or nausea today.Follow-up CT scan of the head shows evolving infarct but no increase hemorrhage mild mass effect but no midline shift OBJECTIVE Temp:  [97.5 F (36.4 C)-98.3 F (36.8 C)] 98.2 F (36.8 C) (08/25 1031) Pulse Rate:  [72-96] 94 (08/25 1031) Cardiac Rhythm: Atrial fibrillation (08/25 0837) Resp:  [17-18] 18 (08/25 1031) BP: (101-176)/(55-91) 156/91 (08/25 1031) SpO2:  [96 %-98 %] 98 % (08/25 1031)  CBC:   Recent Labs Lab 06/25/17 0412 06/26/17 1016  WBC 11.1* 8.4  HGB 11.5* 12.5*  HCT 35.8* 37.8*  MCV 85.6 86.1  PLT 173 156    Basic Metabolic Panel:   Recent Labs Lab 06/24/17 0930 06/25/17 0412 06/26/17 1016  NA 137 135 135  K 4.1 3.8 4.0  CL 104 104 105  CO2 22 25 23   GLUCOSE 103* 110* 85  BUN 15 13 17   CREATININE 1.07 0.99 0.94  CALCIUM 9.4 7.9* 8.5*  MG 1.9 1.7  --   PHOS 3.6 2.7  --     Lipid Panel:     Component Value Date/Time   CHOL 131 05/10/2015 1039   TRIG 101 06/24/2017 0930   HDL 45.00 05/10/2015 1039   CHOLHDL 3 05/10/2015 1039   VLDL 16.0 05/10/2015 1039   LDLCALC 70 05/10/2015 1039   HgbA1c:  Lab Results  Component Value Date   HGBA1C 5.6 06/24/2017   Urine Drug Screen: No results found for: LABOPIA, COCAINSCRNUR, LABBENZ, AMPHETMU, THCU, LABBARB  Alcohol Level No results found for: ETH  IMAGING  Ct Angio Head W Or Wo Contrast Ct Angio Neck W Or Wo Contrast 06/24/2017   IMPRESSION: 1. Unchanged volume of intraparenchymal hemorrhage in the right temporal lobe. Unchanged intraventricular extension and ventricular configuration. 2. No emergent large vessel occlusion. Poor visualization of the origin of the right posterior cerebral artery, which probably arises from a small right posterior communicating artery. The remainder of the visible PCA is normal. 3.  No vascular abnormality to account for the intraparenchymal hemorrhage. In a patient of this age, lobar parenchymal hemorrhage may be secondary to underlying amyloid angiopathy for hypertension. 4. Carotid bifurcation and Aortic Atherosclerosis (ICD10-I70.0) without hemodynamically significant stenosis.  Ct Head Wo Contrast 06/24/2017 IMPRESSION: Large right temporal intraparenchymal hemorrhage with intraventricular extension, mild surrounding edema. Hemorrhage fills the right lateral ventricle and tracks into the third, fourth, and left lateral ventricles. The basilar cisterns remain patent, there is no hydrocephalus.    06/25/2017 IMPRESSION: 1. Status post RIGHT craniotomy for temporoparietal hematoma evacuation. Local edema and small amount of residual intraparenchymal hemorrhage. No residual midline shift.  2. Small RIGHT cerebellar tentorium subdural hematoma and trace subarachnoid hemorrhage, likely re- distributed.  3. Intraventricular blood products without hydrocephalus.  [name] Wo Contrast 06/24/2017 IMPRESSION: 1. Postsurgical changes from interval right temporal hematoma evacuation. 2. Small volume acute ischemia just posterior to the surgical cavity. 3. Decreased intraventricular blood products. 4. Small right and tiny left subdural fluid collections without significant mass effect. 5. Scattered bilateral sulcal FLAIR signal abnormality which may reflect a combination of small volume subarachnoid blood products and artifact.  Dg Chest Port 1 View 06/24/2017 IMPRESSION: 1. Left jugular catheter as above, likely in the left brachiocephalic vein though clinical or blood gas correlation is recommended to exclude arterial placement. 2. Minimal left basilar atelectasis.  06/25/2017  IMPRESSION: 1.  Left IJ line in stable position. 2.  Stable cardiomegaly. 3. Low lung volumes. Mild basilar atelectasis. Mild left base infiltrate cannot be excluded on today's exam.  CT Head 06/27/17 :   1. Evolving infarct along the posterior margin of the intraparenchymal hematoma site. Increased cytotoxic edema there since 06/24/2017 but no significant mass effect. 2. Stable to mildly regressed intracranial hemorrhage; intraparenchymal, intraventricular, and extra-axial. 3. Postoperative changes including continued pneumocephalus.  PHYSICAL EXAM Elderly obese Caucasian male who is currently is  Not in distress . Afebrile. Head is nontraumatic. Neck is supple without bruit.    Cardiac exam no murmur or gallop. Lungs are clear to auscultation. Distal pulses are well felt. Neurological Exam ; sleepy but  eyes open partially to sternal rub.right gaze preference and partial left gaze palsy. Left hemianopsia. Mild right left confusion.  .  Able to move all 4 extremity   against gravity but left side less than the right. Pupils small irregular but reactive. Fundi could not be visualized. Mild left lower facial weakness. Tongue midline. Tone appears to be normal bilaterally. Both plantars are downgoing.  ASSESSMENT/PLAN Mr. Atharva Mirsky is a 83 y.o. male with history of CAD, CKD, HLD, HTN, hypothyroidism and spinal stenosis who presented to the ED with somnolence and headache since Tuesday morning.  He did not receive IV t-PA due to ICH.   Stroke: Large R temporal IPH with extension to R lateral, 3rd, and 4th ventricle with mild associated cytotoxic edema, and no hydrocephalus, in the setting of moderate hypertension and anticoagulation with Eliquis and aspirin, and possible amyloid angiopathy. Status post craniotomy for hematoma evacuation on 06/24/17  Resultant  left hemiparesis and left hemianopsia  CT head: Large right temporal intraparenchymal hemorrhage with intraventricular extension, mild surrounding edema. Hemorrhage fills the right lateral ventricle and tracks into the third, fourth, and left lateral ventricles.  MRI head (POD#0). Small volume acute ischemia posterior to surgical cavity,  small right and tiny left subdural fluid collections without significant mass effect, small volume subarachnoid blood products versus artifact.  MRA head: not ordered  CTA head/neck: No vascular abnormality to account for the intraparenchymal hemorrhage  2D Echo: not ordered   LDL 70  HgbA1c 5.6  SCDs for VTE prophylaxis Diet clear liquid Room service appropriate? Yes; Fluid consistency: Thin  aspirin 81 mg daily and Eliquis (apixaban) daily prior to admission, now on No antithrombotic  Patient counseled to be compliant with his antithrombotic medications  Ongoing aggressive stroke risk factor management  Therapy recommendations:   CLR Disposition:   CLR Hypertension  Stable  Off clevidipine  Added lisinopril and HCTZ Goal SBP < 140 for first 24-hours post-hemorrhage Long-term BP goal normotensive  Hyperlipidemia  Home meds: atorvastatin 80 mg PO daily  LDL 70, goal < 70  Resume statin once ICH is stable  Continue statin at discharge  Other Stroke Risk Factors  Advanced age  ETOH use, advised to drink no more than 2 drink(s) a day  Obesity, Body mass index is 31.83 kg/m., recommend weight loss, diet and exercise as appropriate   Other Active Problems  Headache: Loaded with 1g phenytoin IV, then 500 mg PO BID for headache and seizure prophylaxis in setting of IPH; PRN dilaudid  Hospital day # 3   Plan  Regularize sleep wake cycle. Mobilize out of bed. Continue herapy and rehabilitation consults.  Start aspirin 81 mg  Discussed with son and answered questions. Greater than 50% time during this 25 minute visit  was spent on counseling and coordination of care about his intracerebral hemorrhage, atrial fibrillation stroke prevention discussion. Hopefully transfer to inpatient rehabilitation over the next few days when bed available      Delia Heady, MD Medical Director [facility] Pager: [phone] 06/27/2017 12:06 PM   To contact Stroke  Continuity provider, please refer to [email or web address]. After hours, contact General Neurology

## 2017-06-28 ENCOUNTER — Inpatient Hospital Stay (HOSPITAL_COMMUNITY): Payer: Medicare Other

## 2017-06-28 MED ORDER — VALPROATE SODIUM 500 MG/5ML IV SOLN
500.0000 mg | Freq: Two times a day (BID) | INTRAVENOUS | Status: DC
  Administered 2017-06-28 – 2017-06-30 (×5): 500 mg via INTRAVENOUS
  Filled 2017-06-28 (×6): qty 5

## 2017-06-28 NOTE — Progress Notes (Signed)
Patient more alert this shift, able to verbalize name and DOB w/o difficulty. Scalp wound C/D/I, staples intact, no drainage or order noted, remains with son at bedside.

## 2017-06-28 NOTE — Progress Notes (Signed)
Pt seen and examined.  Not following commands well overnight. Stat CT ordered by Neuro and stable. Family at bedside without concerns today.   EXAM: Temp:  [97.7 F (36.5 C)-99.4 F (37.4 C)] 97.7 F (36.5 C) (08/26 0602) Pulse Rate:  [76-94] 80 (08/26 0602) Resp:  [18-20] 20 (08/26 0602) BP: (142-165)/(78-91) 165/79 (08/26 0602) SpO2:  [96 %-98 %] 98 % (08/26 0602) Intake/Output      08/25 0701 - 08/26 0700 08/26 0701 - 08/27 0700   I.V. (mL/kg) 844 (10.4)    Total Intake(mL/kg) 844 (10.4)    Net +844          Urine Occurrence 4 x     Drowsy Not in any distress Does not follow commands Does move all extremities to pain Incision c/d/i  Plan Repeat head CT stable No new NS recs

## 2017-06-28 NOTE — Progress Notes (Signed)
After assessing patient again, NIHSS 22. Still not following commands. Paged Dr. Gaspar Cola and mad him away. He returned call. Stat CT to be done. Will continue to monitor.

## 2017-06-28 NOTE — Progress Notes (Signed)
Received by side report earlier from [facility]. When I first attempted to assess patient he was too lethargic and did not follow commands. Later he did squeeze hands bilaterally very slightly. Other than that, he really did not follow commands. Difficult to arouse.  Paged MD. Requested Depakote in different route and also made him aware of the difference of NIHSS from previous. He told me to check in 1 hour and rather than sternal rub suggested I take an ink pen to bottom of feet.After this he would like me to page him.

## 2017-06-28 NOTE — Progress Notes (Signed)
Clinically worsening. Not following commands. Moves all extremities but appears non-purposeful. DDx includes somnolence due to intraventricular blood seen on recent CT, versus new hemorrhage or stroke. Obtaining STAT CT head.   Electronically signed: Dr. Caryl Pina

## 2017-06-28 NOTE — Progress Notes (Signed)
STROKE TEAM PROGRESS NOTE   SUBJECTIVE (INTERVAL HISTORY) His  Wife and son is at the bedside.  The patient l again was lethargic last night prompting another Ct scan of head which showed no acute changes but evolving infarct but no increase hemorrhage mild mass effect but no midline shift OBJECTIVE Temp:  [97.6 F (36.4 C)-99.4 F (37.4 C)] 97.6 F (36.4 C) (08/26 1435) Pulse Rate:  [71-87] 71 (08/26 1435) Cardiac Rhythm: Atrial fibrillation (08/26 0830) Resp:  [18-20] 18 (08/26 1435) BP: (142-184)/(78-91) 184/91 (08/26 1435) SpO2:  [96 %-99 %] 99 % (08/26 1435)  CBC:   Recent Labs Lab 06/25/17 0412 06/26/17 1016  WBC 11.1* 8.4  HGB 11.5* 12.5*  HCT 35.8* 37.8*  MCV 85.6 86.1  PLT 173 156    Basic Metabolic Panel:   Recent Labs Lab 06/24/17 0930 06/25/17 0412 06/26/17 1016  NA 137 135 135  K 4.1 3.8 4.0  CL 104 104 105  CO2 22 25 23   GLUCOSE 103* 110* 85  BUN 15 13 17   CREATININE 1.07 0.99 0.94  CALCIUM 9.4 7.9* 8.5*  MG 1.9 1.7  --   PHOS 3.6 2.7  --     Lipid Panel:     Component Value Date/Time   CHOL 131 05/10/2015 1039   TRIG 101 06/24/2017 0930   HDL 45.00 05/10/2015 1039   CHOLHDL 3 05/10/2015 1039   VLDL 16.0 05/10/2015 1039   LDLCALC 70 05/10/2015 1039   HgbA1c:  Lab Results  Component Value Date   HGBA1C 5.6 06/24/2017   Urine Drug Screen: No results found for: LABOPIA, COCAINSCRNUR, LABBENZ, AMPHETMU, THCU, LABBARB  Alcohol Level No results found for: ETH  IMAGING  Ct Angio Head W Or Wo Contrast Ct Angio Neck W Or Wo Contrast 06/24/2017   IMPRESSION: 1. Unchanged volume of intraparenchymal hemorrhage in the right temporal lobe. Unchanged intraventricular extension and ventricular configuration. 2. No emergent large vessel occlusion. Poor visualization of the origin of the right posterior cerebral artery, which probably arises from a small right posterior communicating artery. The remainder of the visible PCA is normal. 3. No vascular  abnormality to account for the intraparenchymal hemorrhage. In a patient of this age, lobar parenchymal hemorrhage may be secondary to underlying amyloid angiopathy for hypertension. 4. Carotid bifurcation and Aortic Atherosclerosis (ICD10-I70.0) without hemodynamically significant stenosis.  Ct Head Wo Contrast 06/24/2017 IMPRESSION: Large right temporal intraparenchymal hemorrhage with intraventricular extension, mild surrounding edema. Hemorrhage fills the right lateral ventricle and tracks into the third, fourth, and left lateral ventricles. The basilar cisterns remain patent, there is no hydrocephalus.    06/25/2017 IMPRESSION: 1. Status post RIGHT craniotomy for temporoparietal hematoma evacuation. Local edema and small amount of residual intraparenchymal hemorrhage. No residual midline shift.  2. Small RIGHT cerebellar tentorium subdural hematoma and trace subarachnoid hemorrhage, likely re- distributed.  3. Intraventricular blood products without hydrocephalus.  Mr Randy Durham Wo Contrast 06/24/2017 IMPRESSION: 1. Postsurgical changes from interval right temporal hematoma evacuation. 2. Small volume acute ischemia just posterior to the surgical cavity. 3. Decreased intraventricular blood products. 4. Small right and tiny left subdural fluid collections without significant mass effect. 5. Scattered bilateral sulcal FLAIR signal abnormality which may reflect a combination of small volume subarachnoid blood products and artifact.  Dg Chest Port 1 View 06/24/2017 IMPRESSION: 1. Left jugular catheter as above, likely in the left brachiocephalic vein though clinical or blood gas correlation is recommended to exclude arterial placement. 2. Minimal left basilar atelectasis.  06/25/2017 IMPRESSION:  1.  Left IJ line in stable position. 2.  Stable cardiomegaly. 3. Low lung volumes. Mild basilar atelectasis. Mild left base infiltrate cannot be excluded on today's exam.  CT Head 06/27/17 :  1. Evolving  infarct along the posterior margin of the intraparenchymal hematoma site. Increased cytotoxic edema there since 06/24/2017 but no significant mass effect. 2. Stable to mildly regressed intracranial hemorrhage; intraparenchymal, intraventricular, and extra-axial. 3. Postoperative changes including continued pneumocephalus.  PHYSICAL EXAM Elderly obese Caucasian male who is currently is  Not in distress . Afebrile. Head is nontraumatic. Neck is supple without bruit.    Cardiac exam no murmur or gallop. Lungs are clear to auscultation. Distal pulses are well felt. Neurological Exam ; sleepy but  eyes open partially to sternal rub.right gaze preference and partial left gaze palsy. Left hemianopsia. Mild right left confusion.  .  Able to move all 4 extremity   against gravity but left side less than the right. Pupils small irregular but reactive. Fundi could not be visualized. Mild left lower facial weakness. Tongue midline. Tone appears to be normal bilaterally. Both plantars are downgoing.  ASSESSMENT/PLAN [name] is a 83 y.o. male with history of CAD, CKD, HLD, HTN, hypothyroidism and spinal stenosis who presented to the ED with somnolence and headache since Tuesday morning.  He did not receive IV t-PA due to ICH.   Stroke: Large R temporal IPH with extension to R lateral, 3rd, and 4th ventricle with mild associated cytotoxic edema, and no hydrocephalus, in the setting of moderate hypertension and anticoagulation with Eliquis and aspirin, and possible amyloid angiopathy. Status post craniotomy for hematoma evacuation on 06/24/17  Resultant  left hemiparesis and left hemianopsia  CT head: Large right temporal intraparenchymal hemorrhage with intraventricular extension, mild surrounding edema. Hemorrhage fills the right lateral ventricle and tracks into the third, fourth, and left lateral ventricles.  MRI head (POD#0). Small volume acute ischemia posterior to surgical cavity, small right  and tiny left subdural fluid collections without significant mass effect, small volume subarachnoid blood products versus artifact.  MRA head: not ordered  CTA head/neck: No vascular abnormality to account for the intraparenchymal hemorrhage  2D Echo: not ordered   LDL 70  HgbA1c 5.6  SCDs for VTE prophylaxis Diet full liquid Room service appropriate? Yes; Fluid consistency: Thin  aspirin 81 mg daily and Eliquis (apixaban) daily prior to admission, now on No antithrombotic  Patient counseled to be compliant with his antithrombotic medications  Ongoing aggressive stroke risk factor management  Therapy recommendations:   CLR Disposition:   CLR Hypertension  Stable  Off clevidipine  Added lisinopril and HCTZ Goal SBP < 140 for first 24-hours post-hemorrhage Long-term BP goal normotensive  Hyperlipidemia  Home meds: atorvastatin 80 mg PO daily  LDL 70, goal < 70  Resume statin once ICH is stable  Continue statin at discharge  Other Stroke Risk Factors  Advanced age  ETOH use, advised to drink no more than 2 drink(s) a day  Obesity, Body mass index is 31.83 kg/m., recommend weight loss, diet and exercise as appropriate   Other Active Problems  Headache: Loaded with 1g phenytoin IV, then 500 mg PO BID for headache and seizure prophylaxis in setting of IPH; PRN dilaudid  Hospital day # 4   Plan  Regularize sleep wake cycle. Mobilize out of bed. Continue these andaspirin 81 mg  Discussed with his mild and answered questions.check EEG her silent seizures since  Greater than 50% time during this 25 minute  visit was spent on counseling and coordination of care about his intracerebral hemorrhage, atrial fibrillation stroke prevention discussion. Hopefully transfer to inpatient rehabilitation over the next few days when bed available      Delia Heady, MD Medical Director [facility] Pager: [phone] 06/28/2017 3:04 PM   To contact Stroke  Continuity provider, please refer to [email or web address]. After hours, contact General Neurology

## 2017-06-28 NOTE — Progress Notes (Signed)
Patient back from CT. Dr Otelia Limes aware. Telemetry on. Patient appears comfortable in no distress. Bed alarm on. Family at bedside.

## 2017-06-29 ENCOUNTER — Inpatient Hospital Stay (HOSPITAL_COMMUNITY): Payer: Medicare Other

## 2017-06-29 DIAGNOSIS — R400 Somnolence: Secondary | ICD-10-CM

## 2017-06-29 DIAGNOSIS — I638 Other cerebral infarction: Secondary | ICD-10-CM

## 2017-06-29 DIAGNOSIS — I480 Paroxysmal atrial fibrillation: Secondary | ICD-10-CM

## 2017-06-29 DIAGNOSIS — E785 Hyperlipidemia, unspecified: Secondary | ICD-10-CM

## 2017-06-29 LAB — CBC
HCT: 34.6 % — ABNORMAL LOW (ref 39.0–52.0)
HEMOGLOBIN: 12.2 g/dL — AB (ref 13.0–17.0)
MCH: 29 pg (ref 26.0–34.0)
MCHC: 35.3 g/dL (ref 30.0–36.0)
MCV: 82.4 fL (ref 78.0–100.0)
Platelets: 262 10*3/uL (ref 150–400)
RBC: 4.2 MIL/uL — ABNORMAL LOW (ref 4.22–5.81)
RDW: 12.9 % (ref 11.5–15.5)
WBC: 8.5 10*3/uL (ref 4.0–10.5)

## 2017-06-29 LAB — BASIC METABOLIC PANEL
ANION GAP: 10 (ref 5–15)
BUN: 14 mg/dL (ref 6–20)
CALCIUM: 8.3 mg/dL — AB (ref 8.9–10.3)
CO2: 23 mmol/L (ref 22–32)
CREATININE: 0.76 mg/dL (ref 0.61–1.24)
Chloride: 98 mmol/L — ABNORMAL LOW (ref 101–111)
GFR calc non Af Amer: >60 mL/min (ref >60)
Glucose, Bld: 116 mg/dL — ABNORMAL HIGH (ref 65–99)
Potassium: 3.4 mmol/L — ABNORMAL LOW (ref 3.5–5.1)
SODIUM: 131 mmol/L — AB (ref 135–145)

## 2017-06-29 MED ORDER — ACETAMINOPHEN 160 MG/5ML PO SOLN: 650.0000 mg | ORAL | Status: DC | PRN

## 2017-06-29 MED ORDER — ATORVASTATIN CALCIUM 80 MG PO TABS: 80.0000 mg | ORAL_TABLET | Freq: Every day | ORAL | Status: DC

## 2017-06-29 MED ORDER — AMANTADINE HCL 100 MG PO CAPS
100.0000 mg | ORAL_CAPSULE | Freq: Two times a day (BID) | ORAL | Status: DC
  Administered 2017-06-29: 100 mg via ORAL
  Filled 2017-06-29 (×2): qty 1

## 2017-06-29 MED ORDER — ACETAMINOPHEN 325 MG PO TABS
650.0000 mg | ORAL_TABLET | ORAL | Status: DC | PRN
  Administered 2017-06-29: 650 mg via ORAL

## 2017-06-29 MED ORDER — AMANTADINE HCL 50 MG/5ML PO SYRP
100.0000 mg | ORAL_SOLUTION | Freq: Two times a day (BID) | ORAL | Status: DC
  Administered 2017-06-30 (×2): 100 mg via ORAL
  Filled 2017-06-29 (×2): qty 10

## 2017-06-29 MED ORDER — ACETAMINOPHEN 650 MG RE SUPP: 650.0000 mg | RECTAL | Status: DC | PRN

## 2017-06-29 MED ORDER — HEPARIN SODIUM (PORCINE) 5000 UNIT/ML IJ SOLN
5000.0000 [IU] | Freq: Three times a day (TID) | INTRAMUSCULAR | Status: DC
  Administered 2017-06-29 – 2017-06-30 (×3): 5000 [IU] via SUBCUTANEOUS
  Filled 2017-06-29 (×3): qty 1

## 2017-06-29 MED ORDER — ENSURE ENLIVE PO LIQD
237.0000 mL | Freq: Three times a day (TID) | ORAL | Status: DC
  Administered 2017-06-29 – 2017-06-30 (×3): 237 mL via ORAL
  Filled 2017-06-29 (×6): qty 237

## 2017-06-29 MED ORDER — ENSURE ENLIVE PO LIQD
237.0000 mL | Freq: Two times a day (BID) | ORAL | Status: DC
  Administered 2017-06-29: 237 mL via ORAL
  Filled 2017-06-29 (×3): qty 237

## 2017-06-29 NOTE — Progress Notes (Signed)
Spoke to patient's son, who is in room with his dad.  Explained the low bed and mats, and he didn't think it would be a good idea, and refused.  I also spoke to son about alerting staff if patient appears or states he has pain. At this time, patient is resting.

## 2017-06-29 NOTE — Progress Notes (Signed)
Observed pt with OT. I await pt's ability to better participate before planning admit to inpt rehab. Hopeful soon. [phone]

## 2017-06-29 NOTE — Progress Notes (Signed)
STROKE TEAM PROGRESS NOTE   SUBJECTIVE (INTERVAL HISTORY) His wife and son is at the bedside.  The patient is again lethargic this morning due to lack of good sleep at night and working hard with PT/OT this am. Hard to arouse but able to move all extremities with stimulation. EEG no seizure. Pending CIR. Will add amantadine and ensure.   OBJECTIVE Temp:  [97.5 F (36.4 C)-98.2 F (36.8 C)] 97.5 F (36.4 C) (08/27 1437) Pulse Rate:  [76-91] 76 (08/27 1437) Cardiac Rhythm: Atrial fibrillation (08/27 0900) Resp:  [16-20] 20 (08/27 1437) BP: (98-161)/(56-85) 146/85 (08/27 1437) SpO2:  [98 %-99 %] 98 % (08/27 1437)  CBC:   Recent Labs Lab 06/26/17 1016 06/29/17 0233  WBC 8.4 8.5  HGB 12.5* 12.2*  HCT 37.8* 34.6*  MCV 86.1 82.4  PLT 156 262    Basic Metabolic Panel:   Recent Labs Lab 06/24/17 0930 06/25/17 0412 06/26/17 1016 06/29/17 0233  NA 137 135 135 131*  K 4.1 3.8 4.0 3.4*  CL 104 104 105 98*  CO2 22 25 23 23   GLUCOSE 103* 110* 85 116*  BUN 15 13 17 14   CREATININE 1.07 0.99 0.94 0.76  CALCIUM 9.4 7.9* 8.5* 8.3*  MG 1.9 1.7  --   --   PHOS 3.6 2.7  --   --     Lipid Panel:     Component Value Date/Time   CHOL 131 05/10/2015 1039   TRIG 101 06/24/2017 0930   HDL 45.00 05/10/2015 1039   CHOLHDL 3 05/10/2015 1039   VLDL 16.0 05/10/2015 1039   LDLCALC 70 05/10/2015 1039   HgbA1c:  Lab Results  Component Value Date   HGBA1C 5.6 06/24/2017   Urine Drug Screen: No results found for: LABOPIA, COCAINSCRNUR, LABBENZ, AMPHETMU, THCU, LABBARB  Alcohol Level No results found for: ETH  IMAGING I have personally reviewed the radiological images below and agree with the radiology interpretations.  Ct Angio Head W Or Wo Contrast Ct Angio Neck W Or Wo Contrast 06/24/2017   IMPRESSION: 1. Unchanged volume of intraparenchymal hemorrhage in the right temporal lobe. Unchanged intraventricular extension and ventricular configuration. 2. No emergent large vessel  occlusion. Poor visualization of the origin of the right posterior cerebral artery, which probably arises from a small right posterior communicating artery. The remainder of the visible PCA is normal. 3. No vascular abnormality to account for the intraparenchymal hemorrhage. In a patient of this age, lobar parenchymal hemorrhage may be secondary to underlying amyloid angiopathy for hypertension. 4. Carotid bifurcation and Aortic Atherosclerosis (ICD10-I70.0) without hemodynamically significant stenosis.  Ct Head Wo Contrast 06/24/2017 IMPRESSION: Large right temporal intraparenchymal hemorrhage with intraventricular extension, mild surrounding edema. Hemorrhage fills the right lateral ventricle and tracks into the third, fourth, and left lateral ventricles. The basilar cisterns remain patent, there is no hydrocephalus.    06/25/2017 IMPRESSION: 1. Status post RIGHT craniotomy for temporoparietal hematoma evacuation. Local edema and small amount of residual intraparenchymal hemorrhage. No residual midline shift.  2. Small RIGHT cerebellar tentorium subdural hematoma and trace subarachnoid hemorrhage, likely re- distributed.  3. Intraventricular blood products without hydrocephalus.  [name] Wo Contrast 06/24/2017 IMPRESSION: 1. Postsurgical changes from interval right temporal hematoma evacuation. 2. Small volume acute ischemia just posterior to the surgical cavity. 3. Decreased intraventricular blood products. 4. Small right and tiny left subdural fluid collections without significant mass effect. 5. Scattered bilateral sulcal FLAIR signal abnormality which may reflect a combination of small volume subarachnoid blood products and  artifact.  CT Head 06/27/17 :  1. Evolving infarct along the posterior margin of the intraparenchymal hematoma site. Increased cytotoxic edema there since 06/24/2017 but no significant mass effect. 2. Stable to mildly regressed intracranial hemorrhage; intraparenchymal,  intraventricular, and extra-axial. 3. Postoperative changes including continued pneumocephalus.  EEG - This EEG is abnormal due to moderate diffuse slowing of the background. Clinical Correlation of the above findings indicates diffuse cerebral dysfunction that is non-specific in etiology and can be seen with hypoxic/ischemic injury, toxic/metabolic encephalopathies, or medication effect.  The absence of epileptiform discharges does not rule out a clinical diagnosis of epilepsy.  Clinical correlation is advised.  TTE 04/06/17 -Left ventricle: The cavity size was normal. Wall thickness was   increased in a pattern of mild LVH. There was focal basal   hypertrophy. Systolic function was normal. The estimated ejection   fraction was in the range of 50% to 55%. Wall motion was normal;   there were no regional wall motion abnormalities. Features are   consistent with a pseudonormal left ventricular filling pattern,   with concomitant abnormal relaxation and increased filling   pressure (grade 2 diastolic dysfunction). - Aortic valve: Mildly to moderately calcified annulus. - Left atrium: The atrium was mildly dilated.   PHYSICAL EXAM Elderly obese Caucasian male who is currently is  Not in distress . Afebrile. Head is nontraumatic. Neck is supple without bruit.    Cardiac exam no murmur or gallop. Lungs are clear to auscultation. Distal pulses are well felt. Neurological Exam: sleepy but  eyes open partially to sternal rub.right gaze preference and partial left gaze palsy. Left hemianopsia. Mild right left confusion.  .  Able to move all 4 extremity   against gravity but left side less than the right. Pupils small irregular but reactive. Fundi could not be visualized. Mild left lower facial weakness. Tongue midline. Tone appears to be normal bilaterally. Both plantars are downgoing.  ASSESSMENT/PLAN Mr. Yash Lassman is a 83 y.o. male with history of CAD, CKD, HLD, HTN, hypothyroidism and spinal  stenosis who presented to the ED with somnolence and headache since Tuesday morning.  He did not receive IV t-PA due to ICH.   ICH vs. hemorrhagic conversion: Large R temporal IPH with IVH s/p craniotomy for hematoma evacuation. Subsequent MRI showed left temporal infarcts, concerning for temporal ICH actually due to hemorrhagic transformation. However patient is on eliquis for PAF treatment at home.  Resultant  left hemiparesis and left hemianopsia  CT head: Large right temporal ICH with IVH, mild surrounding edema.   MRI head (POD#0). Small volume acute ischemia posterior to surgical cavity  CTA head/neck: No vascular abnormality to account for the ICH  2D Echo: EF 50-55% on 04/06/17  Repeat CT right temporal infarct, right temporal ICH s/p evacuation  LDL 70  HgbA1c 5.6  Heparin subcutaneous for VTE prophylaxis Diet full liquid Room service appropriate? Yes; Fluid consistency: Thin  aspirin 81 mg daily and Eliquis (apixaban) daily prior to admission, now on aspirin 81. Will repeat CT head in 3-4 weeks if ICH resolved, will consider to resume anticoagulation.  Patient counseled to be compliant with his antithrombotic medications  Ongoing aggressive stroke risk factor management  Therapy recommendations:   CIR  Disposition:   CIR  PAF on eliquis  03/2015 30 day cardiac event monitoring showed PAF  Patient on eilquis at home  Continue aspirin 81  Will repeat CT head in 3-4 weeks if ICH resolved, will consider to resume anticoagulation  Hypertension  Stable  Off clevidipine  on lisinopril and metoprolol Long-term BP goal normotensive  Hyperlipidemia  Home meds: atorvastatin 80 mg PO daily  LDL 70, goal < 70  Resume Lipitor  Continue statin at discharge  Headache  Loaded with 1 g Depakote  Continue Depakote 500 twice a day  Other Stroke Risk Factors  Advanced age  ETOH use, advised to drink no more than 2 drink(s) a day  CAD on aspirin  Obesity,  Body mass index is 31.83 kg/m., recommend weight loss, diet and exercise as appropriate   Other Active Problems  Spinal stenosis  Drowsy sleepy - at amantadine during the day, correct sleep and wake cycle disturbance  Hospital day # 5   Marvel Plan, MD PhD Stroke Neurology 06/29/2017 6:13 PM   To contact Stroke Continuity provider, please refer to [email or web address]. After hours, contact General Neurology

## 2017-06-29 NOTE — Care Management Note (Signed)
Case Management Note  Patient Details  Name: Lochie Peveler MRN: [id number] Date of Birth: Jul 12, 1934  Subjective/Objective:                    Action/Plan: PT/OT recommendations are for CIR. CM following for d/c disposition.   Expected Discharge Date:                  Expected Discharge Plan:  IP Rehab Facility  In-House Referral:  Clinical Social Work  Discharge planning Services  CM Consult  Post Acute Care Choice:    Choice offered to:     DME Arranged:    DME Agency:     HH Arranged:    HH Agency:     Status of Service:  In process, will continue to follow  If discussed at Long Length of Stay Meetings, dates discussed:    Additional Comments:  Kermit Balo, RN 06/29/2017, 1:49 PM

## 2017-06-29 NOTE — Progress Notes (Signed)
Physical Therapy Treatment Patient Details Name: Randy Durham MRN: [id number] DOB: October 04, 1934 Today's Date: 06/29/2017    History of Present Illness Pt is an 83 year old male with PMH of CAD, CKD, HLD, HTN, Hypothyroidism, Spinal Stenosis. Presents to ED on 8/21 with reported 12 hours of headache, nausea, and vomiting. CT of head revealed large right temporal IPH with IV extension and mild surrounding edema. Patient admitted to Neuro ICU. At 0200 while in ICU patient was taken for a STAT head CT for an acute neuro change in which revealed unchanged amount/size of IPH. Neurosurgery consulted and taken for craniotomy 8/22, intubated about 12 hours. CT on 8/22 shows Status post RIGHT craniotomy for temporoparietal hematoma evacuation. Local edema and small amount of residual intraparenchymal hemorrhage. No residual midline shift. Small RIGHT cerebellar tentorium subdural hematoma and trace subarachnoid hemorrhage, likely re- distributed. pt has a left homonymous hemianopia post-op.    PT Comments    Patient seen for mobility progression and OOB activity. Patient session limited by lethargy but patient was able to follow some commands during session. Performed PROM supine BLEs prior to EOB. Assisted patient OOB to chair with 2 person assist. Will continue to see and progress as tolerated.    Follow Up Recommendations  CIR     Equipment Recommendations  None recommended by PT    Recommendations for Other Services Rehab consult     Precautions / Restrictions Precautions Precautions: Fall (craniotomy incision R side of  head) Restrictions Weight Bearing Restrictions: No Other Position/Activity Restrictions: supine to side to sit slowly due to symptoms    Mobility  Bed Mobility Overal bed mobility: Needs Assistance Bed Mobility: Rolling;Supine to Sit;Sit to Supine Rolling: Max assist;+2 for physical assistance   Supine to sit: Total assist;+2 for physical assistance     General bed  mobility comments: max to total A x2 for all aspects  Transfers Overall transfer level: Needs assistance Equipment used: 2 person hand held assist Transfers: Sit to/from Stand;Stand Pivot Transfers Sit to Stand: Max assist;Total assist;+2 physical assistance;+2 safety/equipment;From elevated surface Stand pivot transfers: Max assist;Total assist;+2 physical assistance;+2 safety/equipment;From elevated surface       General transfer comment: face to face with chuck pad for transition, patient with inability to take on weight through LEs, was attempting to use UE to hold therapist arms  Ambulation/Gait             General Gait Details: unable   Stairs            Wheelchair Mobility    Modified Rankin (Stroke Patients Only) Modified Rankin (Stroke Patients Only) Pre-Morbid Rankin Score: No symptoms Modified Rankin: Severe disability     Balance Overall balance assessment: Needs assistance Sitting-balance support: Feet supported;Bilateral upper extremity supported Sitting balance-Leahy Scale: Poor Sitting balance - Comments: lists to the left during EOb activity, poor ability to arouse, minimal engagement Postural control: Left lateral lean Standing balance support: During functional activity;Bilateral upper extremity supported Standing balance-Leahy Scale: Zero Standing balance comment: max-total A x2                            Cognition Arousal/Alertness: Lethargic Behavior During Therapy: WFL for tasks assessed/performed Overall Cognitive Status: Difficult to assess Area of Impairment: Orientation;Attention;Memory;Following commands;Safety/judgement;Awareness;Problem solving                   Current Attention Level: Focused Memory: Decreased short-term memory;Decreased recall of precautions Following Commands: Follows one  step commands with increased time;Follows one step commands inconsistently     Problem Solving: Slow  processing;Decreased initiation;Difficulty sequencing;Requires verbal cues;Requires tactile cues General Comments: Poor ability to engage with therapies, itermittently following commands      Exercises      General Comments        Pertinent Vitals/Pain Pain Assessment: Faces Faces Pain Scale: No hurt (no grimacing or evidence of distress)    Home Living                      Prior Function            PT Goals (current goals can now be found in the care plan section) Acute Rehab PT Goals Patient Stated Goal: get back to playing golf PT Goal Formulation: With patient/family Time For Goal Achievement: 07/10/17 Potential to Achieve Goals: Good Progress towards PT goals: Progressing toward goals    Frequency    Min 4X/week      PT Plan Current plan remains appropriate    Co-evaluation              AM-PAC PT "6 Clicks" Daily Activity  Outcome Measure  Difficulty turning over in bed (including adjusting bedclothes, sheets and blankets)?: Unable Difficulty moving from lying on back to sitting on the side of the bed? : Unable Difficulty sitting down on and standing up from a chair with arms (e.g., wheelchair, bedside commode, etc,.)?: Unable Help needed moving to and from a bed to chair (including a wheelchair)?: A Lot Help needed walking in hospital room?: Total Help needed climbing 3-5 steps with a railing? : Total 6 Click Score: 7    End of Session Equipment Utilized During Treatment: Gait belt Activity Tolerance: Patient limited by lethargy;Patient limited by fatigue Patient left: in chair;with call bell/phone within reach;with chair alarm set;with nursing/sitter in room;with family/visitor present Nurse Communication: Mobility status;Need for lift equipment PT Visit Diagnosis: Other abnormalities of gait and mobility (R26.89);Other symptoms and signs involving the nervous system (R29.898)     Time: 9211-9417 PT Time Calculation (min) (ACUTE  ONLY): 21 min  Charges:  $Therapeutic Activity: 8-22 mins                    G Codes:       Randy Durham, PT DPT  Board Certified Neurologic Specialist [phone]    Randy Durham 06/29/2017, 10:40 AM

## 2017-06-29 NOTE — Procedures (Signed)
ELECTROENCEPHALOGRAM REPORT  Date of Study: 06/29/2017  Patient's Name: Randy Durham MRN: [id number] Date of Birth: [birth year]  Referring Provider: Dr. Delia Heady  Clinical History: This is an 83 year old man with intracranial bleed and increasing lethargy  Medications: valproate (DEPACON) 500 mg in dextrose 5 % 50 mL IVPB  acetaminophen (TYLENOL) tablet 650 mg  alum & mag hydroxide-simeth (MAALOX/MYLANTA) 200-200-20 MG/5ML suspension 15 mL  aspirin chewable tablet 81 mg  hydrALAZINE (APRESOLINE) injection 5 mg  HYDROcodone-acetaminophen (NORCO/VICODIN) 5-325 MG per tablet 1 tablet  labetalol (NORMODYNE,TRANDATE) injection 10-40 mg  levothyroxine (SYNTHROID, LEVOTHROID) tablet 112 mcg  lisinopril (PRINIVIL,ZESTRIL) tablet 5 mg  metoCLOPramide (REGLAN) injection 10 mg  metoprolol tartrate (LOPRESSOR) tablet 12.5 mg  nitroGLYCERIN (NITROSTAT) SL tablet 0.4 mg  ondansetron (ZOFRAN) injection 4 mg  pantoprazole (PROTONIX) EC tablet 40 mg  promethazine (PHENERGAN) tablet 12.5-25 mg  senna-docusate (Senokot-S) tablet 1 tablet  tamsulosin (FLOMAX) capsule 0.4 mg   Technical Summary: A multichannel digital EEG recording measured by the international 10-20 system with electrodes applied with paste and impedances below 5000 ohms performed as portable with EKG monitoring in a lethargic patient.  Hyperventilation and photic stimulation were not performed.  The digital EEG was referentially recorded, reformatted, and digitally filtered in a variety of bipolar and referential montages for optimal display.   Description: The patient is lethargic during the recording. There is no clear posterior dominant rhythm. The background consists of a large amount of diffuse 4-5 Hz theta and 2-3 Hz delta slowing. Normal sleep architecture is not seen. Hyperventilation and photic stimulation were not performed. There was EKG artifact over C4. Patient noted to have constant leg jerks with no EEG correlate.  There were no epileptiform discharges or electrographic seizures seen.    EKG lead showed irregular rhythm.   Impression: This EEG is abnormal due to moderate diffuse slowing of the background.  Clinical Correlation of the above findings indicates diffuse cerebral dysfunction that is non-specific in etiology and can be seen with hypoxic/ischemic injury, toxic/metabolic encephalopathies, or medication effect.  The absence of epileptiform discharges does not rule out a clinical diagnosis of epilepsy.  Clinical correlation is advised.   Patrcia Dolly, M.D.

## 2017-06-29 NOTE — Progress Notes (Signed)
Occupational Therapy Treatment Patient Details Name: Randy Durham MRN: [id number] DOB: [birth year] Today's Date: 06/29/2017    History of present illness Pt is an 84 year old male with PMH of CAD, CKD, HLD, HTN, Hypothyroidism, Spinal Stenosis. Presents to ED on 8/21 with reported 12 hours of headache, nausea, and vomiting. CT of head revealed large right temporal IPH with IV extension and mild surrounding edema. Patient admitted to Neuro ICU. At 0200 while in ICU patient was taken for a STAT head CT for an acute neuro change in which revealed unchanged amount/size of IPH. Neurosurgery consulted and taken for craniotomy 8/22, intubated about 12 hours. CT on 8/22 shows Status post RIGHT craniotomy for temporoparietal hematoma evacuation. Local edema and small amount of residual intraparenchymal hemorrhage. No residual midline shift. Small RIGHT cerebellar tentorium subdural hematoma and trace subarachnoid hemorrhage, likely re- distributed. pt has a left homonymous hemianopia post-op.   OT comments  Pt continues to present with decreased occupational performance and participation due to lethargy. Pt requiring Max hand-over-hand A to perform self-feeding and grooming while positioned upright in recliner. Pt demonstrating increasing engagement as session progressed and responded well to tactile and verbal cues; however, unable to keep eyes open. Continue to recommend dc to CIR pending pt's progress and lethargy status. Will continues to follow acutely to facilitate safe dc and optimize occupational performance.    Follow Up Recommendations  CIR;Supervision/Assistance - 24 hour    Equipment Recommendations  Other (comment) (Defer to next venue)    Recommendations for Other Services Rehab consult    Precautions / Restrictions Precautions Precautions: Fall Restrictions Weight Bearing Restrictions: No Other Position/Activity Restrictions: supine to side to sit slowly due to symptoms        Mobility Bed Mobility Overal bed mobility: Needs Assistance Bed Mobility: Rolling;Supine to Sit;Sit to Supine Rolling: Max assist;+2 for physical assistance   Supine to sit: Total assist;+2 for physical assistance     General bed mobility comments: Pt in chair upon arrival  Transfers Overall transfer level: Needs assistance Equipment used: None Transfers: Sit to/from Stand Sit to Stand: Total assist;+2 physical assistance;Max assist Stand pivot transfers: Max assist;Total assist;+2 physical assistance;+2 safety/equipment;From elevated surface       General transfer comment: Using bed pad to list hips off surface. Max A to lean forward and push upward. Unable to reach standing    Balance Overall balance assessment: Needs assistance Sitting-balance support: Feet supported;Bilateral upper extremity supported Sitting balance-Leahy Scale: Poor Sitting balance - Comments: lists to the left during EOb activity, poor ability to arouse, minimal engagement Postural control: Left lateral lean Standing balance support: During functional activity;No upper extremity supported Standing balance-Leahy Scale: Zero Standing balance comment: Total A +2 for attempt at sit<>Stand to provide pressure relief                           ADL either performed or assessed with clinical judgement   ADL Overall ADL's : Needs assistance/impaired Eating/Feeding: Maximal assistance;Sitting;Cueing for sequencing Eating/Feeding Details (indicate cue type and reason): Pt performed self feeding with Max A and Max cueing; optimized sitting postion with supportive seating and pillows. Pt able to grasp OT's hand holding feeding utensil and bring hand from bowl to mouth. Pt benefited from tactil cues to bicept to cue for elbow flexion and bring utensil to mouth. Pt also required hand over hand for drinking out of cup. Pt requiring Max verbal cues to increase participation and engagement due  to pt's lethargy.  Family reports that pt was able to fall asleep quickly PTA.  Grooming: Wash/dry face;Brushing hair;Maximal assistance;Sitting;Cueing for sequencing Grooming Details (indicate cue type and reason): Pt requiring Max hand over hand A to perform grooming tasks and Max tactile and verbal cues to engage in ADL.                                General ADL Comments: Focused session on increasing pt's occupational participation and engagement. Pt performed self feeding and grooming tasks while seated in recliner with optimized support for sitting position. Pt required Max A and Max cuing to optimize engagment. Pt follow simple direct cues with increased time (inconsistantly).      Vision   Vision Assessment?: Vision impaired- to be further tested in functional context Additional Comments: Pt continues to keep eye closed. Opened eyes at end of session, but unabel to maintain   Perception     Praxis      Cognition Arousal/Alertness: Lethargic Behavior During Therapy: WFL for tasks assessed/performed Overall Cognitive Status: Difficult to assess Area of Impairment: Attention;Memory;Following commands;Safety/judgement;Awareness;Problem solving                   Current Attention Level: Focused Memory: Decreased short-term memory;Decreased recall of precautions Following Commands: Follows one step commands with increased time;Follows one step commands inconsistently Safety/Judgement: Decreased awareness of safety;Decreased awareness of deficits Awareness: Intellectual Problem Solving: Slow processing;Decreased initiation;Difficulty sequencing;Requires verbal cues;Requires tactile cues General Comments: Pt responding to his name. Answered a few simple yes/no questions. Pt following simple direct prompts inconsistantly.         Exercises Exercises: Other exercises Other Exercises Other Exercises: PROM and stretching of RUE of shoulder, elbow, wrist, and hand; 5 reps; seated in  recliner   Shoulder Instructions       General Comments Pt family present during session and educated on pressure relief.    Pertinent Vitals/ Pain       Pain Assessment: Faces Faces Pain Scale: No hurt Pain Intervention(s): Monitored during session  Home Living                                          Prior Functioning/Environment              Frequency  Min 3X/week        Progress Toward Goals  OT Goals(current goals can now be found in the care plan section)  Progress towards OT goals: Progressing toward goals  Acute Rehab OT Goals Patient Stated Goal: get back to playing golf OT Goal Formulation: With patient/family Time For Goal Achievement: 07/10/17 Potential to Achieve Goals: Good ADL Goals Pt Will Perform Grooming: with supervision;sitting Pt Will Perform Upper Body Bathing: with supervision;sitting Pt Will Perform Lower Body Bathing: with min guard assist;with caregiver independent in assisting;sitting/lateral leans Pt Will Transfer to Toilet: with mod assist;stand pivot transfer;bedside commode Pt Will Perform Toileting - Clothing Manipulation and hygiene: with min guard assist;sit to/from stand;with caregiver independent in assisting Additional ADL Goal #1: Pt will perform bed mobility at supervision level prior to particpating in ADL activity  Plan Discharge plan remains appropriate;Other (comment) (Pending pt progress)    Co-evaluation                 AM-PAC PT "6 Clicks" Daily Activity  Outcome Measure   Help from another person eating meals?: A Lot Help from another person taking care of personal grooming?: A Lot Help from another person toileting, which includes using toliet, bedpan, or urinal?: Total Help from another person bathing (including washing, rinsing, drying)?: Total Help from another person to put on and taking off regular upper body clothing?: Total Help from another person to put on and taking off  regular lower body clothing?: Total 6 Click Score: 8    End of Session Equipment Utilized During Treatment: Gait belt  OT Visit Diagnosis: Unsteadiness on feet (R26.81);Other abnormalities of gait and mobility (R26.89);Muscle weakness (generalized) (M62.81);Low vision, both eyes (H54.2);Other symptoms and signs involving the nervous system (R29.898);Other symptoms and signs involving cognitive function   Activity Tolerance Patient tolerated treatment well;Patient limited by lethargy   Patient Left in chair;with call bell/phone within reach;with chair alarm set;with family/visitor present   Nurse Communication Mobility status;Need for lift equipment;Precautions        Time: 1610-9604 OT Time Calculation (min): 35 min  Charges: OT General Charges $OT Visit: 1 Visit OT Treatments $Self Care/Home Management : 23-37 mins  Steffi Noviello MSOT, OTR/L Acute Rehab Pager: [phone] Office: [phone]   Theodoro Grist Cloyd Ragas 06/29/2017, 1:31 PM

## 2017-06-29 NOTE — Care Management Important Message (Signed)
Important Message  Patient Details  Name: Randy Durham MRN: [id number] Date of Birth: 16-Oct-1934   Medicare Important Message Given:  Yes    Annmarie Plemmons 06/29/2017, 2:00 PM

## 2017-06-29 NOTE — Progress Notes (Signed)
EEG completed, results pending. 

## 2017-06-29 NOTE — Clinical Social Work Note (Signed)
Clinical Social Work Assessment  Patient Details  Name: Randy Durham MRN: [id number] Date of Birth: [birth year]  Date of referral:  06/29/17               Reason for consult:  Facility Placement, Discharge Planning                Permission sought to share information with:  Facility Sport and exercise psychologist, Family Supports Permission granted to share information::  Yes, Verbal Permission Granted  Name::     [place]  Agency::  SNF  Relationship::  Wife, Son  Sport and exercise psychologist Information:     Housing/Transportation Living arrangements for the past 2 months:  Single Family Home Source of Information:  Spouse, Adult Children Patient Interpreter Needed:  None Criminal Activity/Legal Involvement Pertinent to Current Situation/Hospitalization:  No - Comment as needed Significant Relationships:  Adult Children, Spouse Lives with:  Self, Spouse Do you feel safe going back to the place where you live?  Yes Need for family participation in patient care:  Yes (Comment) (patient currently not oriented)  Care giving concerns:  Patient has been living at home with spouse, but family is unable to care for him in his current condition. Patient will benefit from short term rehab stay prior to returning home in order to improve ability to perform ADLs.   Social Worker assessment / plan:  CSW met with patient's wife and son at bedside to discuss discharge planning. CSW discussed recommendation for CIR, but explained the concern that he may not be able to participate in the intense level of therapies required for CIR admission. CSW explained secondary option for placement at SNF for short term rehab. Patient's wife and son asked questions about facilities and the process for admitting, as well as when they would know whether or not the patient was definitively approved or denied CIR admission. CSW answered questions. Patient's wife and son were appreciative of information. CSW to fax out referral, and follow to  facilitate SNF discharge if needed.  Employment status:  Retired Forensic scientist:  Medicare PT Recommendations:  Inpatient [facility] / Referral to community resources:  [place]  Patient/Family's Response to care:  Patient's wife and son would prefer patient to admit to CIR but are agreeable to SNF admission if needed.  Patient/Family's Understanding of and Emotional Response to Diagnosis, Current Treatment, and Prognosis:  Patient's wife and son are aware that they cannot provide the care that the patient needs at this time, that he will need to recover some strength and improve independence prior to returning home. Patient's wife and son seemed overwhelmed and tired with everything that had been happening with the patient over the past few days, but are hopeful that he will improve and be able to come home soon.  Emotional Assessment Appearance:  Appears stated age Attitude/Demeanor/Rapport:  Unable to Assess Affect (typically observed):  Unable to Assess Orientation:  Oriented to Self Alcohol / Substance use:  Not Applicable Psych involvement (Current and /or in the community):  No (Comment)  Discharge Needs  Concerns to be addressed:  Care Coordination, Discharge Planning Concerns Readmission within the last 30 days:  No Current discharge risk:  Physical Impairment, Cognitively Impaired Barriers to Discharge:  Continued Medical Work up   [employer], [place] 06/29/2017, 3:32 PM

## 2017-06-29 NOTE — [place] FL2 (Signed)
[place] MEDICAID FL2 LEVEL OF CARE SCREENING TOOL     IDENTIFICATION  Patient Name: Randy Durham Birthdate: Mar 26, 1934 Sex: male Admission Date (Current Location): 06/23/2017  [facility] and [place] Number:  Producer, television/film/video and Address:  [facility], [address], [place], [place] [address]      Provider Number: [id number]  Attending Physician Name and Address:  Marvel Plan, MD  Relative Name and Phone Number:       Current Level of Care: Hospital Recommended Level of Care: Skilled Nursing Facility Prior Approval Number:    Date Approved/Denied:   PASRR Number: [id number]  Discharge Plan: SNF    Current Diagnoses: Patient Active Problem List   Diagnosis Date Noted  . Intracerebral hemorrhage 06/24/2017  . ICH (intracerebral hemorrhage) (HCC) 06/24/2017  . Intraventricular hemorrhage (HCC) 06/24/2017  . Cytotoxic brain edema (HCC) 06/24/2017  . Paroxysmal atrial fibrillation (HCC) 03/30/2015  . Bradycardia 03/27/2015  . NSVT (nonsustained ventricular tachycardia) (HCC) 03/27/2015  . CAD- occluded distal LAD, 75-80% CFX- medical Rx 03/27/2015  . NSTEMI 03/25/15 03/25/2015  . Chronic kidney disease (CKD), stage III (moderate)   . Obesity (BMI 30-39.9)   . Spinal stenosis   . Hypertension   . Hyperlipidemia     Orientation RESPIRATION BLADDER Height & Weight     Self  Normal Incontinent Weight: 179 lb 10.8 oz (81.5 kg) Height:  5\' 3"  (160 cm)  BEHAVIORAL SYMPTOMS/MOOD NEUROLOGICAL BOWEL NUTRITION STATUS    Convulsions/Seizures Incontinent Diet (see DC summary)  AMBULATORY STATUS COMMUNICATION OF NEEDS Skin   Total Care Verbally Skin abrasions                       Personal Care Assistance Level of Assistance  Bathing, Feeding, Dressing Bathing Assistance: Maximum assistance Feeding assistance: Maximum assistance Dressing Assistance: Maximum assistance     Functional Limitations Info             SPECIAL CARE  FACTORS FREQUENCY  PT (By licensed PT), OT (By licensed OT)     PT Frequency: 5x/wk OT Frequency: 5x/wk            Contractures      Additional Factors Info  Code Status, Allergies Code Status Info: Full Allergies Info: NKA           Current Medications (06/29/2017):  This is the current hospital active medication list Current Facility-Administered Medications  Medication Dose Route Frequency Provider Last Rate Last Dose  .  stroke: mapping our early stages of recovery book   Does not apply Once Caryl Pina, MD      . 0.9 % NaCl with KCl 20 mEq/ L  infusion   Intravenous Continuous Coletta Memos, MD 80 mL/hr at 06/29/17 0346 1,000 mL at 06/29/17 0346  . acetaminophen (TYLENOL) tablet 650 mg  650 mg Per Tube Q4H PRN Marton Redwood, NP   650 mg at 06/26/17 1707   Or  . acetaminophen (TYLENOL) solution 650 mg  650 mg Per Tube Q4H PRN Patteson, Paul Dykes, NP       Or  . acetaminophen (TYLENOL) suppository 650 mg  650 mg Rectal Q4H PRN Patteson, Samuel A, NP      . alum & mag hydroxide-simeth (MAALOX/MYLANTA) 200-200-20 MG/5ML suspension 15 mL  15 mL Oral Q6H PRN Coletta Memos, MD   15 mL at 06/26/17 1753  . aspirin chewable tablet 81 mg  81 mg Oral Daily Patteson, Paul Dykes, NP  81 mg at 06/29/17 1029  . hydrALAZINE (APRESOLINE) injection 5 mg  5 mg Intravenous Q4H PRN Carolyn Stare A, NP   5 mg at 06/28/17 1507  . HYDROcodone-acetaminophen (NORCO/VICODIN) 5-325 MG per tablet 1 tablet  1 tablet Oral Q4H PRN Marton Redwood, NP   1 tablet at 06/29/17 0345  . labetalol (NORMODYNE,TRANDATE) injection 10-40 mg  10-40 mg Intravenous Q10 min PRN Coletta Memos, MD   20 mg at 06/28/17 1507  . levothyroxine (SYNTHROID, LEVOTHROID) tablet 112 mcg  112 mcg Per Tube QAC breakfast Carolyn Stare A, NP   112 mcg at 06/29/17 1028  . lisinopril (PRINIVIL,ZESTRIL) tablet 5 mg  5 mg Per Tube QHS Patteson, Samuel A, NP   5 mg at 06/28/17 2301  . metoCLOPramide (REGLAN) injection 10 mg  10  mg Intravenous Q6H PRN Patteson, Samuel A, NP      . metoprolol tartrate (LOPRESSOR) tablet 12.5 mg  12.5 mg Per Tube BID Patteson, Samuel A, NP   12.5 mg at 06/29/17 1028  . nitroGLYCERIN (NITROSTAT) SL tablet 0.4 mg  0.4 mg Sublingual Q5 Min x 3 PRN Caryl Pina, MD      . ondansetron [facility]) injection 4 mg  4 mg Intravenous Q4H PRN Coletta Memos, MD   4 mg at 06/25/17 2205  . pantoprazole (PROTONIX) EC tablet 40 mg  40 mg Oral QHS Micki Riley, MD   40 mg at 06/28/17 2302  . promethazine (PHENERGAN) tablet 12.5-25 mg  12.5-25 mg Oral Q4H PRN Coletta Memos, MD      . senna-docusate (Senokot-S) tablet 1 tablet  1 tablet Oral BID Caryl Pina, MD   1 tablet at 06/29/17 1029  . tamsulosin (FLOMAX) capsule 0.4 mg  0.4 mg Oral Daily Caryl Pina, MD   0.4 mg at 06/29/17 1029  . valproate (DEPACON) 500 mg in dextrose 5 % 50 mL IVPB  500 mg Intravenous Q12H Caryl Pina, MD 55 mL/hr at 06/29/17 1028 500 mg at 06/29/17 1028     Discharge Medications: Please see discharge summary for a list of discharge medications.  Relevant Imaging Results:  Relevant Lab Results:   Additional Information SS#: [id number]  Baldemar Lenis, LCSW

## 2017-06-30 ENCOUNTER — Inpatient Hospital Stay (HOSPITAL_COMMUNITY)
Admission: RE | Admit: 2017-06-30 | Discharge: 2017-07-22 | DRG: 091 | Disposition: A | Discharge status: Skilled Nursing Facility | Payer: Medicare Other | Source: Intra-hospital | Attending: Physical Medicine & Rehabilitation | Admitting: Physical Medicine & Rehabilitation

## 2017-06-30 DIAGNOSIS — B964 Proteus (mirabilis) (morganii) as the cause of diseases classified elsewhere: Secondary | ICD-10-CM | POA: Diagnosis present

## 2017-06-30 DIAGNOSIS — R2689 Other abnormalities of gait and mobility: Principal | ICD-10-CM | POA: Diagnosis present

## 2017-06-30 DIAGNOSIS — R4182 Altered mental status, unspecified: Secondary | ICD-10-CM | POA: Diagnosis present

## 2017-06-30 DIAGNOSIS — E039 Hypothyroidism, unspecified: Secondary | ICD-10-CM | POA: Diagnosis present

## 2017-06-30 DIAGNOSIS — R4701 Aphasia: Secondary | ICD-10-CM | POA: Diagnosis present

## 2017-06-30 DIAGNOSIS — Z66 Do not resuscitate: Secondary | ICD-10-CM | POA: Diagnosis present

## 2017-06-30 DIAGNOSIS — G441 Vascular headache, not elsewhere classified: Secondary | ICD-10-CM

## 2017-06-30 DIAGNOSIS — N390 Urinary tract infection, site not specified: Secondary | ICD-10-CM | POA: Diagnosis present

## 2017-06-30 DIAGNOSIS — Z48811 Encounter for surgical aftercare following surgery on the nervous system: Secondary | ICD-10-CM | POA: Diagnosis not present

## 2017-06-30 DIAGNOSIS — R400 Somnolence: Secondary | ICD-10-CM | POA: Diagnosis present

## 2017-06-30 DIAGNOSIS — G8929 Other chronic pain: Secondary | ICD-10-CM | POA: Diagnosis present

## 2017-06-30 DIAGNOSIS — R627 Adult failure to thrive: Secondary | ICD-10-CM | POA: Diagnosis present

## 2017-06-30 DIAGNOSIS — M6281 Muscle weakness (generalized): Secondary | ICD-10-CM | POA: Diagnosis not present

## 2017-06-30 DIAGNOSIS — Z87891 Personal history of nicotine dependence: Secondary | ICD-10-CM | POA: Diagnosis not present

## 2017-06-30 DIAGNOSIS — I252 Old myocardial infarction: Secondary | ICD-10-CM | POA: Diagnosis not present

## 2017-06-30 DIAGNOSIS — E785 Hyperlipidemia, unspecified: Secondary | ICD-10-CM | POA: Diagnosis present

## 2017-06-30 DIAGNOSIS — N183 Chronic kidney disease, stage 3 unspecified: Secondary | ICD-10-CM | POA: Diagnosis present

## 2017-06-30 DIAGNOSIS — Z79899 Other long term (current) drug therapy: Secondary | ICD-10-CM | POA: Diagnosis not present

## 2017-06-30 DIAGNOSIS — Z7982 Long term (current) use of aspirin: Secondary | ICD-10-CM

## 2017-06-30 DIAGNOSIS — I2510 Atherosclerotic heart disease of native coronary artery without angina pectoris: Secondary | ICD-10-CM | POA: Diagnosis present

## 2017-06-30 DIAGNOSIS — I69120 Aphasia following nontraumatic intracerebral hemorrhage: Secondary | ICD-10-CM | POA: Diagnosis not present

## 2017-06-30 DIAGNOSIS — I10 Essential (primary) hypertension: Secondary | ICD-10-CM | POA: Diagnosis not present

## 2017-06-30 DIAGNOSIS — I480 Paroxysmal atrial fibrillation: Secondary | ICD-10-CM | POA: Diagnosis present

## 2017-06-30 DIAGNOSIS — I63412 Cerebral infarction due to embolism of left middle cerebral artery: Secondary | ICD-10-CM | POA: Diagnosis not present

## 2017-06-30 DIAGNOSIS — R5383 Other fatigue: Secondary | ICD-10-CM

## 2017-06-30 DIAGNOSIS — R41841 Cognitive communication deficit: Secondary | ICD-10-CM | POA: Diagnosis not present

## 2017-06-30 DIAGNOSIS — Z7901 Long term (current) use of anticoagulants: Secondary | ICD-10-CM | POA: Diagnosis not present

## 2017-06-30 DIAGNOSIS — I129 Hypertensive chronic kidney disease with stage 1 through stage 4 chronic kidney disease, or unspecified chronic kidney disease: Secondary | ICD-10-CM | POA: Diagnosis present

## 2017-06-30 DIAGNOSIS — I611 Nontraumatic intracerebral hemorrhage in hemisphere, cortical: Secondary | ICD-10-CM

## 2017-06-30 DIAGNOSIS — Z515 Encounter for palliative care: Secondary | ICD-10-CM

## 2017-06-30 DIAGNOSIS — G936 Cerebral edema: Secondary | ICD-10-CM | POA: Diagnosis present

## 2017-06-30 DIAGNOSIS — G464 Cerebellar stroke syndrome: Secondary | ICD-10-CM | POA: Diagnosis not present

## 2017-06-30 DIAGNOSIS — R569 Unspecified convulsions: Secondary | ICD-10-CM | POA: Diagnosis present

## 2017-06-30 DIAGNOSIS — I619 Nontraumatic intracerebral hemorrhage, unspecified: Secondary | ICD-10-CM | POA: Diagnosis not present

## 2017-06-30 DIAGNOSIS — R1312 Dysphagia, oropharyngeal phase: Secondary | ICD-10-CM | POA: Diagnosis not present

## 2017-06-30 DIAGNOSIS — A499 Bacterial infection, unspecified: Secondary | ICD-10-CM | POA: Diagnosis not present

## 2017-06-30 DIAGNOSIS — N179 Acute kidney failure, unspecified: Secondary | ICD-10-CM | POA: Diagnosis present

## 2017-06-30 DIAGNOSIS — R278 Other lack of coordination: Secondary | ICD-10-CM | POA: Diagnosis not present

## 2017-06-30 LAB — BASIC METABOLIC PANEL
Anion gap: 8 (ref 5–15)
BUN: 12 mg/dL (ref 6–20)
CHLORIDE: 101 mmol/L (ref 101–111)
CO2: 25 mmol/L (ref 22–32)
CREATININE: 0.78 mg/dL (ref 0.61–1.24)
Calcium: 8.4 mg/dL — ABNORMAL LOW (ref 8.9–10.3)
GFR calc non Af Amer: >60 mL/min (ref >60)
Glucose, Bld: 104 mg/dL — ABNORMAL HIGH (ref 65–99)
POTASSIUM: 3.5 mmol/L (ref 3.5–5.1)
SODIUM: 134 mmol/L — AB (ref 135–145)

## 2017-06-30 LAB — CBC
HEMATOCRIT: 36.6 % — AB (ref 39.0–52.0)
HEMOGLOBIN: 12.4 g/dL — AB (ref 13.0–17.0)
MCH: 28.5 pg (ref 26.0–34.0)
MCHC: 33.9 g/dL (ref 30.0–36.0)
MCV: 84.1 fL (ref 78.0–100.0)
Platelets: 267 10*3/uL (ref 150–400)
RBC: 4.35 MIL/uL (ref 4.22–5.81)
RDW: 13 % (ref 11.5–15.5)
WBC: 7.8 10*3/uL (ref 4.0–10.5)

## 2017-06-30 MED ORDER — ASPIRIN 81 MG PO CHEW
81.0000 mg | CHEWABLE_TABLET | Freq: Every day | ORAL | Status: DC
  Administered 2017-07-01 – 2017-07-13 (×13): 81 mg via ORAL
  Filled 2017-06-30 (×13): qty 1

## 2017-06-30 MED ORDER — NITROGLYCERIN 0.4 MG SL SUBL: 0.4000 mg | SUBLINGUAL_TABLET | SUBLINGUAL | Status: DC | PRN

## 2017-06-30 MED ORDER — LEVOTHYROXINE SODIUM 112 MCG PO TABS
112.0000 ug | ORAL_TABLET | Freq: Every day | ORAL | Status: DC
  Administered 2017-07-01 – 2017-07-22 (×21): 112 ug
  Filled 2017-06-30 (×23): qty 1

## 2017-06-30 MED ORDER — AMANTADINE HCL 50 MG/5ML PO SYRP: 100.0000 mg | ORAL_SOLUTION | Freq: Two times a day (BID) | ORAL | Status: DC

## 2017-06-30 MED ORDER — LISINOPRIL 5 MG PO TABS
5.0000 mg | ORAL_TABLET | Freq: Every day | ORAL | Status: DC
  Administered 2017-06-30 – 2017-07-21 (×22): 5 mg via ORAL
  Filled 2017-06-30 (×21): qty 1

## 2017-06-30 MED ORDER — ONDANSETRON HCL 4 MG/2ML IJ SOLN
4.0000 mg | Freq: Four times a day (QID) | INTRAMUSCULAR | Status: DC | PRN
Start: 2017-06-30 — End: 2017-07-22

## 2017-06-30 MED ORDER — LISINOPRIL 5 MG PO TABS
5.0000 mg | ORAL_TABLET | Freq: Every day | ORAL | Status: DC
  Filled 2017-06-30: qty 1

## 2017-06-30 MED ORDER — VALPROATE SODIUM 500 MG/5ML IV SOLN
500.0000 mg | Freq: Two times a day (BID) | INTRAVENOUS | Status: DC
  Administered 2017-06-30 – 2017-07-01 (×2): 500 mg via INTRAVENOUS
  Filled 2017-06-30 (×5): qty 5

## 2017-06-30 MED ORDER — SORBITOL 70 % SOLN
30.0000 mL | Freq: Every day | Status: DC | PRN
  Administered 2017-07-11: 30 mL via ORAL
  Filled 2017-06-30: qty 30

## 2017-06-30 MED ORDER — ENSURE ENLIVE PO LIQD
237.0000 mL | Freq: Three times a day (TID) | ORAL | Status: DC
  Administered 2017-07-01 – 2017-07-22 (×58): 237 mL via ORAL

## 2017-06-30 MED ORDER — METOPROLOL TARTRATE 12.5 MG HALF TABLET
12.5000 mg | ORAL_TABLET | Freq: Two times a day (BID) | ORAL | Status: DC
  Administered 2017-06-30: 12.5 mg
  Filled 2017-06-30: qty 1

## 2017-06-30 MED ORDER — HEPARIN SODIUM (PORCINE) 5000 UNIT/ML IJ SOLN: 5000.0000 [IU] | Freq: Three times a day (TID) | INTRAMUSCULAR | Status: DC

## 2017-06-30 MED ORDER — ACETAMINOPHEN 160 MG/5ML PO SOLN
650.0000 mg | ORAL | Status: DC | PRN
  Filled 2017-06-30: qty 20.3

## 2017-06-30 MED ORDER — QUETIAPINE FUMARATE 50 MG PO TABS
50.0000 mg | ORAL_TABLET | Freq: Every evening | ORAL | Status: DC | PRN
  Administered 2017-06-30 – 2017-07-03 (×4): 50 mg via ORAL
  Filled 2017-06-30 (×4): qty 1

## 2017-06-30 MED ORDER — AMANTADINE HCL 50 MG/5ML PO SYRP
100.0000 mg | ORAL_SOLUTION | Freq: Two times a day (BID) | ORAL | Status: DC
  Administered 2017-07-01 – 2017-07-06 (×11): 100 mg via ORAL
  Filled 2017-06-30 (×12): qty 10

## 2017-06-30 MED ORDER — ACETAMINOPHEN 650 MG RE SUPP: 650.0000 mg | RECTAL | Status: DC | PRN

## 2017-06-30 MED ORDER — ACETAMINOPHEN 325 MG PO TABS
650.0000 mg | ORAL_TABLET | ORAL | Status: DC | PRN
  Administered 2017-07-01: 650 mg via ORAL
  Filled 2017-06-30 (×2): qty 2

## 2017-06-30 MED ORDER — ATORVASTATIN CALCIUM 80 MG PO TABS
80.0000 mg | ORAL_TABLET | Freq: Every day | ORAL | Status: DC
  Administered 2017-06-30 – 2017-07-21 (×22): 80 mg via ORAL
  Filled 2017-06-30 (×21): qty 1

## 2017-06-30 MED ORDER — METOPROLOL TARTRATE 12.5 MG HALF TABLET
12.5000 mg | ORAL_TABLET | Freq: Two times a day (BID) | ORAL | Status: DC
  Administered 2017-07-01 – 2017-07-22 (×43): 12.5 mg via ORAL
  Filled 2017-06-30 (×43): qty 1

## 2017-06-30 MED ORDER — ONDANSETRON HCL 4 MG PO TABS
4.0000 mg | ORAL_TABLET | Freq: Four times a day (QID) | ORAL | Status: DC | PRN
  Administered 2017-07-15: 4 mg via ORAL
  Filled 2017-06-30: qty 1

## 2017-06-30 MED ORDER — HEPARIN SODIUM (PORCINE) 5000 UNIT/ML IJ SOLN
5000.0000 [IU] | Freq: Three times a day (TID) | INTRAMUSCULAR | Status: DC
  Administered 2017-06-30 – 2017-07-13 (×38): 5000 [IU] via SUBCUTANEOUS
  Filled 2017-06-30 (×39): qty 1

## 2017-06-30 NOTE — Progress Notes (Signed)
Randy Oyster, MD Physician Addendum Physical Medicine and Rehabilitation  Consult Note Date of Service: 06/25/2017 3:06 PM  Related encounter: ED to Hosp-Admission (Current) from 06/23/2017 in [facility]ICAL     Expand All Collapse All   [] Hide copied text [] Hover for attribution information      Physical Medicine and Rehabilitation Consult Reason for Consult: Decreased functional mobility Referring Physician: Dr. Pearlean Brownie   HPI: Randy Durham is a 83 y.o. right handed male with history of CAD maintained on Eliquis, hypertension, CKD stage III. Per chart review patient lives with wife in [place]. Independent and driving prior to admission. One level apartment with 10 steps to entry. Wife can assist but limited physically he also has a daughter in the area that works. Presented 06/24/2017 with somnolence and increasing headache as well as bouts of nausea vomiting and chills. CT of the head showed a large right temporal intraparenchymal hemorrhage with intraventricular extension and mild surrounding edema. Hemorrhage fills the right lateral ventricle and tracks into the third, fourth and left lateral ventricles. Underwent craniotomy right temporal 4 evacuation of hematoma 06/24/2017 per Dr. Mikal Plane. Maintained on Depakote for seizure prophylaxis. Formal physical and occupational therapy evaluations pending. M.D. has requested physical medicine rehabilitation consult.   Review of Systems  Constitutional: Positive for malaise/fatigue. Negative for chills and fever.  HENT: Negative for hearing loss.   Eyes: Negative for blurred vision and double vision.  Respiratory: Negative for cough and shortness of breath.   Cardiovascular: Positive for palpitations and leg swelling. Negative for chest pain.  Gastrointestinal: Positive for constipation, nausea and vomiting.  Genitourinary: Positive for urgency.  Musculoskeletal: Positive for joint pain and  myalgias.  Skin: Negative for rash.  Neurological: Positive for headaches. Negative for seizures.  All other systems reviewed and are negative.      Past Medical History:  Diagnosis Date  . Anemia   . CAD (coronary artery disease)    Cath 03/26/2015 100% distal LAD stenosis, 80% ostial D1 stenosis, 75% ramus stenosis, 60% RPDA stenosis, 30% proximal RCA stenosis. Medical therapy, outpt myoview to assess LCx lesion  . Chronic kidney disease (CKD), stage III (moderate)   . Hyperlipidemia   . Hypertension   . Hypothyroidism   . Obesity (BMI 30-39.9)   . Spinal stenosis   . Thyroid nodule         Past Surgical History:  Procedure Laterality Date  . CARDIAC CATHETERIZATION N/A 03/26/2015   Procedure: Left Heart Cath and Coronary Angiography;  Surgeon: Runell Gess, MD;  Location: [facility] INVASIVE CV LAB;  Service: Cardiovascular;  Laterality: N/A;  . CRANIOTOMY Right 06/24/2017   Procedure: CRANIOTOMY HEMATOMA EVACUATION SUBDURAL;  Surgeon: Coletta Memos, MD;  Location: [facility] OR;  Service: Neurosurgery;  Laterality: Right;  . PILONIDAL CYST EXCISION    . TONSILLECTOMY          Family History  Problem Relation Age of Onset  . Heart attack Father 47       cause of death  . Prostate cancer Father   . Cancer Sister   . Heart disease Sister    Social History:  reports that he has quit smoking. His smoking use included Cigars and Pipe. He has never used smokeless tobacco. He reports that he drinks alcohol. He reports that he does not use drugs. Allergies: No Known Allergies       Medications Prior to Admission  Medication Sig Dispense Refill  . apixaban (ELIQUIS) 5 MG  TABS tablet Take 1 tablet (5 mg total) by mouth 2 (two) times daily. 60 tablet 5  . aspirin EC 81 MG tablet Take 81 mg by mouth daily.    Marland Kitchen atorvastatin (LIPITOR) 80 MG tablet Take 1 tablet (80 mg total) by mouth daily at 6 PM. 30 tablet 5  . levothyroxine (SYNTHROID, LEVOTHROID) 112 MCG tablet Take  112 mcg by mouth daily before breakfast.    . lisinopril (PRINIVIL,ZESTRIL) 10 MG tablet Take 1 tablet (10 mg total) by mouth at bedtime. 30 tablet 5  . metoprolol tartrate (LOPRESSOR) 25 MG tablet Take 0.5 tablets (12.5 mg total) by mouth 2 (two) times daily. 30 tablet 11  . Multiple Vitamins-Minerals (CENTRUM SILVER PO) Take 1 tablet by mouth daily.    Marland Kitchen omeprazole (PRILOSEC) 20 MG capsule Take 20 mg by mouth daily.    . tamsulosin (FLOMAX) 0.4 MG CAPS capsule Take 0.4 mg by mouth daily.    Marland Kitchen acetaminophen (TYLENOL) 325 MG tablet Take 325 mg by mouth every 6 (six) hours as needed (pain).    . fluticasone (FLONASE) 50 MCG/ACT nasal spray Place 1 spray into both nostrils daily as needed (seasonal allergies).    . Menthol-Zinc Oxide (GOLD BOND EX) Apply 1 application topically at bedtime.    . nitroGLYCERIN (NITROSTAT) 0.4 MG SL tablet Place 1 tablet (0.4 mg total) under the tongue every 5 (five) minutes x 3 doses as needed for chest pain. 25 tablet 6  . sildenafil (VIAGRA) 50 MG tablet Take 50 mg by mouth daily as needed for erectile dysfunction. Take 1 hour prior to sexual activity ( do not exceed 1 dose per 24 hour period)       Home: Home Living Family/patient expects to be discharged to:: Private residence Living Arrangements: Spouse/significant other  Functional History: Functional Status:  Mobility:  ADL:  Cognition: Cognition Overall Cognitive Status: Impaired/Different from baseline Arousal/Alertness: Awake/alert Orientation Level: Oriented to person, Oriented to place Attention: Selective Selective Attention: Impaired Selective Attention Impairment: Verbal basic, Functional basic Memory: Impaired Memory Impairment: Other (comment) (Attention deficits impede working memory) IT consultant Overall Cognitive Status: Impaired/Different from baseline  Blood pressure (!) 103/47, pulse (!) 57, temperature 98.5 F (36.9 C), temperature source Oral, resp. rate 13,  height 5\' 3"  (1.6 m), weight 81.5 kg (179 lb 10.8 oz), SpO2 96 %. Physical Exam  Constitutional: He appears well-developed.  Eyes:  Pupils reactive to light  Neck: Normal range of motion. Neck supple. No thyromegaly present.  Cardiovascular: Normal rate, regular rhythm and normal heart sounds.   Respiratory: Effort normal and breath sounds normal. No respiratory distress.  GI: Soft. Bowel sounds are normal. He exhibits no distension.  Neurological:  Patient lethargic but arousable. He keeps his eyes closed during exam. Recognized his name but said he was in [place], [place]. Did move all 4's spontaneously but inconsistently to command. Restless.   Skin:  Craniotomy site clean and dry  Psychiatric:  Lethargic    Lab Results Last 24 Hours       Results for orders placed or performed during the hospital encounter of 06/23/17 (from the past 24 hour(s))  Hemoglobin A1c     Status: None   Collection Time: 06/24/17  5:45 PM  Result Value Ref Range   Hgb A1c MFr Bld 5.6 4.8 - 5.6 %   Mean Plasma Glucose 114.02 mg/dL  Glucose, capillary     Status: Abnormal   Collection Time: 06/24/17  6:31 PM  Result Value Ref Range  Glucose-Capillary 116 (H) 65 - 99 mg/dL  Basic metabolic panel     Status: Abnormal   Collection Time: 06/25/17  4:12 AM  Result Value Ref Range   Sodium 135 135 - 145 mmol/L   Potassium 3.8 3.5 - 5.1 mmol/L   Chloride 104 101 - 111 mmol/L   CO2 25 22 - 32 mmol/L   Glucose, Bld 110 (H) 65 - 99 mg/dL   BUN 13 6 - 20 mg/dL   Creatinine, Ser 1.61 0.61 - 1.24 mg/dL   Calcium 7.9 (L) 8.9 - 10.3 mg/dL   GFR calc non Af Amer >60 >60 mL/min   GFR calc Af Amer >60 >60 mL/min   Anion gap 6 5 - 15  CBC     Status: Abnormal   Collection Time: 06/25/17  4:12 AM  Result Value Ref Range   WBC 11.1 (H) 4.0 - 10.5 K/uL   RBC 4.18 (L) 4.22 - 5.81 MIL/uL   Hemoglobin 11.5 (L) 13.0 - 17.0 g/dL   HCT 09.6 (L) 04.5 - 40.9 %   MCV 85.6 78.0 - 100.0 fL   MCH  27.5 26.0 - 34.0 pg   MCHC 32.1 30.0 - 36.0 g/dL   RDW 81.1 91.4 - 78.2 %   Platelets 173 150 - 400 K/uL  Magnesium     Status: None   Collection Time: 06/25/17  4:12 AM  Result Value Ref Range   Magnesium 1.7 1.7 - 2.4 mg/dL  Phosphorus     Status: None   Collection Time: 06/25/17  4:12 AM  Result Value Ref Range   Phosphorus 2.7 2.5 - 4.6 mg/dL      Imaging Results (Last 48 hours)  Ct Angio Head W Or Wo Contrast  Addendum Date: 06/24/2017   ADDENDUM REPORT: 06/24/2017 03:50 ADDENDUM: There is bilateral atherosclerotic calcification of the internal carotid arteries at the skullbase without hemodynamically significant stenosis. This was erroneously labeled as normal in the findings of the initial report. Electronically Signed   By: Deatra Robinson M.D.   On: 06/24/2017 03:50   Result Date: 06/24/2017 CLINICAL DATA:  Intracranial hemorrhage EXAM: CT ANGIOGRAPHY HEAD AND NECK TECHNIQUE: Multidetector CT imaging of the head and neck was performed using the standard protocol during bolus administration of intravenous contrast. Multiplanar CT image reconstructions and MIPs were obtained to evaluate the vascular anatomy. Carotid stenosis measurements (when applicable) are obtained utilizing NASCET criteria, using the distal internal carotid diameter as the denominator. CONTRAST:  50 mL Isovue 370 COMPARISON:  Head CT 06/24/2017 FINDINGS: CTA NECK FINDINGS Aortic arch: There is no aneurysm or dissection of the visualized ascending aorta or aortic arch. Normal 3 vessel aortic branching pattern. The visualized proximal subclavian arteries are normal. Right carotid system: The right common carotid origin is widely patent. There is no common carotid or internal carotid artery dissection or aneurysm. Atherosclerotic calcification at the carotid bifurcation without hemodynamically significant stenosis. Left carotid system: The left common carotid origin is widely patent. There is no common carotid or  internal carotid artery dissection or aneurysm. Atherosclerotic calcification at the carotid bifurcation without hemodynamically significant stenosis. Vertebral arteries: The vertebral system is left dominant. Both vertebral artery origins are normal. Both vertebral arteries are normal to their confluence with the basilar artery. Skeleton: There is no bony spinal canal stenosis. No lytic or blastic lesions. Other neck: The nasopharynx is clear. The oropharynx and hypopharynx are normal. The epiglottis is normal. The supraglottic larynx, glottis and subglottic larynx are normal. No retropharyngeal  collection. The parapharyngeal spaces are preserved. The parotid and submandibular glands are normal. No sialolithiasis or salivary ductal dilatation. The thyroid gland is normal. There is no cervical lymphadenopathy. Upper chest: No pneumothorax or pleural effusion. No nodules or masses. Review of the MIP images confirms the above findings CTA HEAD FINDINGS Anterior circulation: --Intracranial internal carotid arteries: Normal. --Anterior cerebral arteries: Azygos configuration of the anterior cerebral artery. --Middle cerebral arteries: Normal. --Posterior communicating arteries: Present on the left. Not clearly seen on the right. Posterior circulation: --Posterior cerebral arteries: I cannot adequately visualize the origin of the right posterior cerebral artery. The P2 segment is normal. The left PCA arises from a fetal origin. --Superior cerebellar arteries: Normal. --Basilar artery: Normal. --Anterior inferior cerebellar arteries: Normal. --Posterior inferior cerebellar arteries: Normal. Venous sinuses: As permitted by contrast timing, patent. Anatomic variants: Azygos anterior cerebral artery. Fetal origin of the left posterior cerebral artery. Delayed phase: No parenchymal contrast enhancement. Intraparenchymal hematoma in the right temporal lobe measures 3.3 x 6.0 x 3.0 cm (volume = 31 cm^3), previously 5.4 x 3.5 x  3.1 cm (volume = 31 cm^3). Size of the ventricles are unchanged. The degree of intraventricular extension of blood is unchanged. Review of the MIP images confirms the above findings IMPRESSION: 1. Unchanged volume of intraparenchymal hemorrhage in the right temporal lobe. Unchanged intraventricular extension and ventricular configuration. 2. No emergent large vessel occlusion. Poor visualization of the origin of the right posterior cerebral artery, which probably arises from a small right posterior communicating artery. The remainder of the visible PCA is normal. 3. No vascular abnormality to account for the intraparenchymal hemorrhage. In a patient of this age, lobar parenchymal hemorrhage may be secondary to underlying amyloid angiopathy for hypertension. 4. Carotid bifurcation and Aortic Atherosclerosis (ICD10-I70.0) without hemodynamically significant stenosis. Electronically Signed: By: Deatra Robinson M.D. On: 06/24/2017 03:31   Ct Head Wo Contrast  Result Date: 06/24/2017 CLINICAL DATA:  Status post craniotomy for RIGHT temporal lobe hematoma evacuation. LEFT-sided drift and mental status change. EXAM: CT HEAD WITHOUT CONTRAST TECHNIQUE: Contiguous axial images were obtained from the base of the skull through the vertex without intravenous contrast. COMPARISON:  CT HEAD Mar 24, 2017 at 0012 hours and MRI of the head at 1108 hours FINDINGS: BRAIN: Interval evacuation of RIGHT temporoparietal hematoma with small amount of residual blood products, and edema. RIGHT temporal lobe pneumocephalus. Bifrontal pneumocephalus with mild mass effect on subjacent frontal sulci. Ventricles and sulci are overall normal for patient's age, with redistributed blood products in the lateral ventricles, third and fourth ventricles. No hydrocephalus. Trace RIGHT cerebellar tentorial subdural hematoma. Small amount of scattered subarachnoid hemorrhage. No midline shift, mass effect or acute large vascular territory infarcts.  Basal cisterns are patent. VASCULAR: Moderate to severe calcific atherosclerosis of the carotid siphons. SKULL: Status post RIGHT temporoparietal craniotomy, overlying scalp soft tissue swelling with skin staples. Trace for 8 subgaleal fusion. SINUSES/ORBITS: Trace paranasal sinus mucosal thickening without air-fluid levels. Mastoid air cells are well aerated.The included ocular globes and orbital contents are non-suspicious. OTHER: None. IMPRESSION: 1. Status post RIGHT craniotomy for temporoparietal hematoma evacuation. Local edema and small amount of residual intraparenchymal hemorrhage. No residual midline shift. 2. Small RIGHT cerebellar tentorium subdural hematoma and trace subarachnoid hemorrhage, likely re- distributed. 3. Intraventricular blood products without hydrocephalus. Electronically Signed   By: Awilda Metro M.D.   On: 06/24/2017 19:51   Ct Head Wo Contrast  Result Date: 06/24/2017 CLINICAL DATA:  Altered level of consciousness. EXAM: CT HEAD WITHOUT  CONTRAST TECHNIQUE: Contiguous axial images were obtained from the base of the skull through the vertex without intravenous contrast. COMPARISON:  None. FINDINGS: Brain: Large right temporal parenchymal hematoma extending into and filling the right lateral ventricle. Intraparenchymal component measures 3.5 x 6.7 x 3.1 cm (volume = 38 cm^3) with mild surrounding edema. Blood tracks into the third and fourth ventricle, with minimal dependent hemorrhage in the occipital horn of the left lateral ventricle. No discrete hydrocephalus. The basilar cisterns remain patent. Hemorrhage crosses the midline without discrete midline shift. No subdural blood. Moderate generalized atrophy. Chronic small vessel ischemia. Vascular: Atherosclerosis of skullbase vasculature without hyperdense vessel or abnormal calcification. Skull: No skull fracture. Sinuses/Orbits: Paranasal sinuses and mastoid air cells are clear. The visualized orbits are unremarkable.  Other: None. IMPRESSION: Large right temporal intraparenchymal hemorrhage with intraventricular extension, mild surrounding edema. Hemorrhage fills the right lateral ventricle and tracks into the third, fourth, and left lateral ventricles. The basilar cisterns remain patent, there is no hydrocephalus. Critical Value/emergent results were called by telephone at the time of interpretation on 06/24/2017 at 12:25 am to Dr. Ross Marcus , who verbally acknowledged these results. Electronically Signed   By: Rubye Oaks M.D.   On: 06/24/2017 00:26   Ct Angio Neck W Or Wo Contrast  Addendum Date: 06/24/2017   ADDENDUM REPORT: 06/24/2017 03:50 ADDENDUM: There is bilateral atherosclerotic calcification of the internal carotid arteries at the skullbase without hemodynamically significant stenosis. This was erroneously labeled as normal in the findings of the initial report. Electronically Signed   By: Deatra Robinson M.D.   On: 06/24/2017 03:50   Result Date: 06/24/2017 CLINICAL DATA:  Intracranial hemorrhage EXAM: CT ANGIOGRAPHY HEAD AND NECK TECHNIQUE: Multidetector CT imaging of the head and neck was performed using the standard protocol during bolus administration of intravenous contrast. Multiplanar CT image reconstructions and MIPs were obtained to evaluate the vascular anatomy. Carotid stenosis measurements (when applicable) are obtained utilizing NASCET criteria, using the distal internal carotid diameter as the denominator. CONTRAST:  50 mL Isovue 370 COMPARISON:  Head CT 06/24/2017 FINDINGS: CTA NECK FINDINGS Aortic arch: There is no aneurysm or dissection of the visualized ascending aorta or aortic arch. Normal 3 vessel aortic branching pattern. The visualized proximal subclavian arteries are normal. Right carotid system: The right common carotid origin is widely patent. There is no common carotid or internal carotid artery dissection or aneurysm. Atherosclerotic calcification at the carotid  bifurcation without hemodynamically significant stenosis. Left carotid system: The left common carotid origin is widely patent. There is no common carotid or internal carotid artery dissection or aneurysm. Atherosclerotic calcification at the carotid bifurcation without hemodynamically significant stenosis. Vertebral arteries: The vertebral system is left dominant. Both vertebral artery origins are normal. Both vertebral arteries are normal to their confluence with the basilar artery. Skeleton: There is no bony spinal canal stenosis. No lytic or blastic lesions. Other neck: The nasopharynx is clear. The oropharynx and hypopharynx are normal. The epiglottis is normal. The supraglottic larynx, glottis and subglottic larynx are normal. No retropharyngeal collection. The parapharyngeal spaces are preserved. The parotid and submandibular glands are normal. No sialolithiasis or salivary ductal dilatation. The thyroid gland is normal. There is no cervical lymphadenopathy. Upper chest: No pneumothorax or pleural effusion. No nodules or masses. Review of the MIP images confirms the above findings CTA HEAD FINDINGS Anterior circulation: --Intracranial internal carotid arteries: Normal. --Anterior cerebral arteries: Azygos configuration of the anterior cerebral artery. --Middle cerebral arteries: Normal. --Posterior communicating arteries: Present on the  left. Not clearly seen on the right. Posterior circulation: --Posterior cerebral arteries: I cannot adequately visualize the origin of the right posterior cerebral artery. The P2 segment is normal. The left PCA arises from a fetal origin. --Superior cerebellar arteries: Normal. --Basilar artery: Normal. --Anterior inferior cerebellar arteries: Normal. --Posterior inferior cerebellar arteries: Normal. Venous sinuses: As permitted by contrast timing, patent. Anatomic variants: Azygos anterior cerebral artery. Fetal origin of the left posterior cerebral artery. Delayed phase: No  parenchymal contrast enhancement. Intraparenchymal hematoma in the right temporal lobe measures 3.3 x 6.0 x 3.0 cm (volume = 31 cm^3), previously 5.4 x 3.5 x 3.1 cm (volume = 31 cm^3). Size of the ventricles are unchanged. The degree of intraventricular extension of blood is unchanged. Review of the MIP images confirms the above findings IMPRESSION: 1. Unchanged volume of intraparenchymal hemorrhage in the right temporal lobe. Unchanged intraventricular extension and ventricular configuration. 2. No emergent large vessel occlusion. Poor visualization of the origin of the right posterior cerebral artery, which probably arises from a small right posterior communicating artery. The remainder of the visible PCA is normal. 3. No vascular abnormality to account for the intraparenchymal hemorrhage. In a patient of this age, lobar parenchymal hemorrhage may be secondary to underlying amyloid angiopathy for hypertension. 4. Carotid bifurcation and Aortic Atherosclerosis (ICD10-I70.0) without hemodynamically significant stenosis. Electronically Signed: By: Deatra Robinson M.D. On: 06/24/2017 03:31   [name] ZO Contrast  Result Date: 06/24/2017 CLINICAL DATA:  Intracerebral hemorrhage status post craniotomy. Altered level of consciousness. EXAM: MRI HEAD WITHOUT AND WITH CONTRAST TECHNIQUE: Multiplanar, multiecho pulse sequences of the brain and surrounding structures were obtained without and with intravenous contrast. CONTRAST:  18mL MULTIHANCE GADOBENATE DIMEGLUMINE 529 MG/ML IV SOLN COMPARISON:  Head CT/ CTA 06/24/2017 FINDINGS: Brain: Sequelae of interval posterior right temporal craniotomy are identified for evaluate duration of the previously demonstrated large right temporal parenchymal hematoma. Small volume blood products and gas are present in this region. There is a small amount of parenchymal restricted diffusion just posterior and superior to the hematoma cavity in the posterior temporal  lobe/temporoparietal junction region. Small volume pneumocephalus is noted over both anterior frontal convexities without significant mass effect. A thin subdural fluid collection over the right cerebral convexity measures up to 7 mm in thickness in the temporoparietal region with only mild flattening of the underlying brain. There is a tiny subdural fluid collection over the posterior left cerebral convexity measuring 1 mm in thickness without mass effect. Small volume blood products are present in both lateral ventricles, greatly decreased on the right following interval surgical evacuation. Scattered FLAIR hyperintensity in predominantly posterior cerebral sulci bilaterally may represent a combination of subarachnoid blood (some associated susceptibility most notable in the right parietal region) and possibly artifactual lack of CSF suppression due to the patient's intubated status and supplemental oxygenation. There is no midline shift. A chronic lacunar infarct is noted anteriorly in the left centrum semiovale. Mild, smooth dural thickening and enhancement over the right cerebral convexity is likely postoperative. Mildly prominent leptomeningeal enhancement in the right temporoparietal region may be predominantly vascular in etiology. No masslike enhancement is seen. Vascular: Major intracranial vascular flow voids are preserved. Skull and upper cervical spine: No suspicious marrow lesion. Sinuses/Orbits: Unremarkable orbits. Partially visualized endotracheal tube with retained fluid in the pharynx. Mild bilateral ethmoid and left frontal sinus mucosal thickening. Clear mastoid air cells. Other: Small right-sided scalp fluid collection at the craniotomy site. Overlying skin staples. IMPRESSION: 1. Postsurgical changes from interval right  temporal hematoma evacuation. 2. Small volume acute ischemia just posterior to the surgical cavity. 3. Decreased intraventricular blood products. 4. Small right and tiny left  subdural fluid collections without significant mass effect. 5. Scattered bilateral sulcal FLAIR signal abnormality which may reflect a combination of small volume subarachnoid blood products and artifact. Electronically Signed   By: Sebastian Ache M.D.   On: 06/24/2017 12:31   Dg Chest Port 1 View  Result Date: 06/25/2017 CLINICAL DATA:  Intracranial hemorrhage. EXAM: PORTABLE CHEST 1 VIEW COMPARISON:  06/24/2017 . FINDINGS: Left IJ line stable position. Stable cardiomegaly. Mild bibasilar atelectasis. Mild left base infiltrate cannot be excluded on today's exam . Small left pleural effusion cannot be excluded . No pneumothorax . IMPRESSION: 1.  Left IJ line in stable position. 2.  Stable cardiomegaly. 3. Low lung volumes. Mild basilar atelectasis. Mild left base infiltrate cannot be excluded on today's exam. Electronically Signed   By: Maisie Fus  Register   On: 06/25/2017 07:24   Dg Chest Port 1 View  Result Date: 06/24/2017 CLINICAL DATA:  Status postextubation. EXAM: PORTABLE CHEST 1 VIEW COMPARISON:  03/25/2015 chest radiographs. 06/24/2017 head and neck CTA. FINDINGS: A left jugular catheter courses into the left superior aspect of the mediastinum in then courses poor is on only to the right at the level of the aortic knob. When correlating with today's CTA, this is most likely in the central aspect of the left brachiocephalic vein, although arterial placement is not excluded. Aortic atherosclerosis is noted. The cardiac silhouette is within normal limits in size. There is minimal left basilar atelectasis. No sizable pleural effusion or pneumothorax is identified. Thoracic spondylosis is noted. IMPRESSION: 1. Left jugular catheter as above, likely in the left brachiocephalic vein though clinical or blood gas correlation is recommended to exclude arterial placement. 2. Minimal left basilar atelectasis. Electronically Signed   By: Sebastian Ache M.D.   On: 06/24/2017 13:32      Assessment/Plan: Diagnosis: right temporal ICH 1. Does the need for close, 24 hr/day medical supervision in concert with the patient's rehab needs make it unreasonable for this patient to be served in a less intensive setting? Yes 2. Co-Morbidities requiring supervision/potential complications: CAD, HTN, altered day/night rhythm, cognitive-behavioral issues 3. Due to bladder management, bowel management, safety, skin/wound care, disease management, medication administration, pain management and patient education, does the patient require 24 hr/day rehab nursing? Yes 4. Does the patient require coordinated care of a physician, rehab nurse, PT (1-2 hrs/day, 5 days/week), OT (1-2 hrs/day, 5 days/week) and SLP (1-2 hrs/day, 5 days/week) to address physical and functional deficits in the context of the above medical diagnosis(es)? Yes Addressing deficits in the following areas: balance, endurance, locomotion, strength, transferring, bowel/bladder control, bathing, dressing, feeding, grooming, toileting, cognition, speech, language, swallowing and psychosocial support 5. Can the patient actively participate in an intensive therapy program of at least 3 hrs of therapy per day at least 5 days per week? Yes and Potentially 6. The potential for patient to make measurable gains while on inpatient rehab is good 7. Anticipated functional outcomes upon discharge from inpatient rehab are supervision  with PT, supervision and min assist with OT, supervision and min assist with SLP. 8. Estimated rehab length of stay to reach the above functional goals is: potentially 18-24 days 9. Anticipated D/C setting: Home 10. Anticipated post D/C treatments: HH therapy 11. Overall Rehab/Functional Prognosis: good  RECOMMENDATIONS: This patient's condition is appropriate for continued rehabilitative care in the following setting: CIR Patient has  agreed to participate in recommended program. N/A Note that insurance  prior authorization may be required for reimbursement for recommended care.  Comment: Pt more lethargic today. Apparently didn't sleep at all last night. Need to normalize sleep pattern. Recommend a scheduled sleeping medication. Also need to maximize his stimulation/OOB during the day time and minimize the use of neuro-sedating medications. Follow up Head CT in the AM. Will be an appropriate candidate for CIR. Rehab Admissions Coordinator to follow up.  Thanks,  Randy Oyster, MD, [place] [facility]    Charlton Amor., PA-C 06/25/2017    Revision History                             Routing History

## 2017-06-30 NOTE — H&P (Signed)
Physical Medicine and Rehabilitation Admission H&P    Chief Complaint  Patient presents with  . Altered Mental Status  : HPI: Randy Durham is a 83 y.o. right handed male with history of Spinal stenosis, CAD/NSTEMI/PAF  maintained on Eliquis, hypertension, CKD stage III. Per chart review patient lives with wife in [place]. Independent and driving prior to admission. One level apartment with 10 steps to entry. Wife can assist but limited physically he also has a daughter in the area that works. Presented 06/24/2017 with somnolence and increasing headache as well as bouts of nausea vomiting and chills. CT of the head showed a large right temporal intraparenchymal hemorrhage with intraventricular extension and mild surrounding edema. Hemorrhage fills the right lateral ventricle and tracks into the third, fourth and left lateral ventricles. Underwent craniotomy right temporal for evacuation of hematoma 06/24/2017 per Dr. Cyndy Freeze. Maintained on Valproate for seizure prophylaxis. Latest follow-up CT scan 06/28/2017 showing no new mass effect or midline shift. EEG completed due to some somnolence 06/29/2017 negative for seizure with somnolence felt to be possibly due to narcotics. Patient had been placed on amantadine twice a day. His Eliquis was discontinued secondary to [place] but low dose aspirin had been initiated 06/26/2017. Placed on subcutaneous heparin for DVT prophylaxis 06/29/2017.  Physical and occupational therapy evaluations completed with recommendations of physical medicine rehabilitation consult. Patient was admitted for comprehensive rehabilitation program.  Review of Systems  Unable to perform ROS: Acuity of condition   Past Medical History:  Diagnosis Date  . Anemia   . CAD (coronary artery disease)    Cath 03/26/2015 100% distal LAD stenosis, 80% ostial D1 stenosis, 75% ramus stenosis, 60% RPDA stenosis, 30% proximal RCA stenosis. Medical therapy, outpt myoview to assess LCx lesion    . Chronic kidney disease (CKD), stage III (moderate)   . Hyperlipidemia   . Hypertension   . Hypothyroidism   . Obesity (BMI 30-39.9)   . Spinal stenosis   . Thyroid nodule    Past Surgical History:  Procedure Laterality Date  . CARDIAC CATHETERIZATION N/A 03/26/2015   Procedure: Left Heart Cath and Coronary Angiography;  Surgeon: Lorretta Harp, MD;  Location: [facility];  Service: Cardiovascular;  Laterality: N/A;  . CRANIOTOMY Right 06/24/2017   Procedure: CRANIOTOMY HEMATOMA EVACUATION SUBDURAL;  Surgeon: Ashok Pall, MD;  Location: [facility];  Service: Neurosurgery;  Laterality: Right;  . PILONIDAL CYST EXCISION    . TONSILLECTOMY     Family History  Problem Relation Age of Onset  . Heart attack Father 24       cause of death  . Prostate cancer Father   . Cancer Sister   . Heart disease Sister    Social History:  reports that he has quit smoking. His smoking use included Cigars and Pipe. He smoked 0.25 packs per day. He has never used smokeless tobacco. He reports that he drinks alcohol. He reports that he does not use drugs. Allergies: No Known Allergies Medications Prior to Admission  Medication Sig Dispense Refill  . apixaban (ELIQUIS) 5 MG TABS tablet Take 1 tablet (5 mg total) by mouth 2 (two) times daily. 60 tablet 5  . aspirin EC 81 MG tablet Take 81 mg by mouth daily.    Marland Kitchen atorvastatin (LIPITOR) 80 MG tablet Take 1 tablet (80 mg total) by mouth daily at 6 PM. 30 tablet 5  . levothyroxine (SYNTHROID, LEVOTHROID) 112 MCG tablet Take 112 mcg by mouth daily before breakfast.    .  lisinopril (PRINIVIL,ZESTRIL) 10 MG tablet Take 1 tablet (10 mg total) by mouth at bedtime. 30 tablet 5  . metoprolol tartrate (LOPRESSOR) 25 MG tablet Take 0.5 tablets (12.5 mg total) by mouth 2 (two) times daily. 30 tablet 11  . Multiple Vitamins-Minerals (CENTRUM SILVER PO) Take 1 tablet by mouth daily.    Marland Kitchen omeprazole (PRILOSEC) 20 MG capsule Take 20 mg by mouth daily.    .  tamsulosin (FLOMAX) 0.4 MG CAPS capsule Take 0.4 mg by mouth daily.    Marland Kitchen acetaminophen (TYLENOL) 325 MG tablet Take 325 mg by mouth every 6 (six) hours as needed (pain).    . fluticasone (FLONASE) 50 MCG/ACT nasal spray Place 1 spray into both nostrils daily as needed (seasonal allergies).    . Menthol-Zinc Oxide (GOLD BOND EX) Apply 1 application topically at bedtime.    . nitroGLYCERIN (NITROSTAT) 0.4 MG SL tablet Place 1 tablet (0.4 mg total) under the tongue every 5 (five) minutes x 3 doses as needed for chest pain. 25 tablet 6  . sildenafil (VIAGRA) 50 MG tablet Take 50 mg by mouth daily as needed for erectile dysfunction. Take 1 hour prior to sexual activity ( do not exceed 1 dose per 24 hour period)       Home: Home Living Family/patient expects to be discharged to:: Inpatient rehab Living Arrangements: Spouse/significant other Additional Comments: Pt's family is very interested in CIR   Functional History: Prior Function Level of Independence: Independent  Functional Status:  Mobility: Bed Mobility Overal bed mobility: Needs Assistance Bed Mobility: Rolling, Supine to Sit, Sit to Supine Rolling: Max assist, +2 for physical assistance Supine to sit: Total assist, +2 for physical assistance Sit to supine: Total assist General bed mobility comments: Pt in chair upon arrival Transfers Overall transfer level: Needs assistance Equipment used: None Transfers: Sit to/from Stand Sit to Stand: Total assist, +2 physical assistance, Max assist Stand pivot transfers: Max assist, Total assist, +2 physical assistance, +2 safety/equipment, From elevated surface General transfer comment: Using bed pad to list hips off surface. Max A to lean forward and push upward. Unable to reach standing Ambulation/Gait General Gait Details: unable    ADL: ADL Overall ADL's : Needs assistance/impaired Eating/Feeding: Maximal assistance, Sitting, Cueing for sequencing Eating/Feeding Details  (indicate cue type and reason): Pt performed self feeding with Max A and Max cueing; optimized sitting postion with supportive seating and pillows. Pt able to grasp OT's hand holding feeding utensil and bring hand from bowl to mouth. Pt benefited from tactil cues to bicept to cue for elbow flexion and bring utensil to mouth. Pt also required hand over hand for drinking out of cup. Pt requiring Max verbal cues to increase participation and engagement due to pt's lethargy. Family reports that pt was able to fall asleep quickly PTA.  Grooming: Wash/dry face, Brushing hair, Maximal assistance, Sitting, Cueing for sequencing Grooming Details (indicate cue type and reason): Pt requiring Max hand over hand A to perform grooming tasks and Max tactile and verbal cues to engage in ADL.  General ADL Comments: Focused session on increasing pt's occupational participation and engagement. Pt performed self feeding and grooming tasks while seated in recliner with optimized support for sitting position. Pt required Max A and Max cuing to optimize engagment. Pt follow simple direct cues with increased time (inconsistantly).   Cognition: Cognition Overall Cognitive Status: Difficult to assess Arousal/Alertness: Awake/alert Orientation Level: Oriented to person Attention: Selective Selective Attention: Impaired Selective Attention Impairment: Verbal basic, Functional basic Memory:  Impaired Memory Impairment: Other (comment) (Attention deficits impede working memory) Cognition Arousal/Alertness: Lethargic Behavior During Therapy: WFL for tasks assessed/performed Overall Cognitive Status: Difficult to assess Area of Impairment: Attention, Memory, Following commands, Safety/judgement, Awareness, Problem solving Orientation Level: Time, Situation Current Attention Level: Focused Memory: Decreased short-term memory, Decreased recall of precautions Following Commands: Follows one step commands with increased time,  Follows one step commands inconsistently Safety/Judgement: Decreased awareness of safety, Decreased awareness of deficits Awareness: Intellectual Problem Solving: Slow processing, Decreased initiation, Difficulty sequencing, Requires verbal cues, Requires tactile cues General Comments: Pt responding to his name. Answered a few simple yes/no questions. Pt following simple direct prompts inconsistantly.  Difficult to assess due to: Level of arousal  Physical Exam: Blood pressure 134/79, pulse 80, temperature 98.1 F (36.7 C), temperature source Oral, resp. rate 18, height _0  (1.6 m), weight 81.6 kg (180 lb), SpO2 98 %. Physical Exam  Constitutional: No distress.  HENT:  Head: Normocephalic.  Mouth/Throat: Oropharynx is clear and moist.  Eyes:  Pupils are pinpoint  Neck: Normal range of motion. Neck supple. No JVD present. No tracheal deviation present. No thyromegaly present.  Cardiovascular: Normal rate.  Exam reveals no friction rub.   No murmur heard. Cardiac rate controlled  Respiratory: Effort normal and breath sounds normal. No respiratory distress. He has no wheezes. He has no rales.  GI: Soft. Bowel sounds are normal. He exhibits no distension. There is no tenderness. There is no rebound.  Skin: Skin is warm.   Skin warm and dry. Craniotomy site clean and dry Neurological. Patient is lethargic but arousable. He will answer yes no simple questions with some inaccuracy. Does provide his name when asked. I asked him where he is, and he is unable to tell me even when given choices. Pt will typically not open eye lids. I held them open and he made eye contact with me and family, some tracking. Pupils pinpoint. Moves all 4's spontaneously. Speech more clear. Withdraws to pain in all 4's.  Psych: pleasantly confused    Results for orders placed or performed during the hospital encounter of 06/23/17 (from the past 48 hour(s))  CBC     Status: Abnormal   Collection Time: 06/29/17   2:33 AM  Result Value Ref Range   WBC 8.5 4.0 - 10.5 K/uL   RBC 4.20 (L) 4.22 - 5.81 MIL/uL   Hemoglobin 12.2 (L) 13.0 - 17.0 g/dL   HCT 34.6 (L) 39.0 - 52.0 %   MCV 82.4 78.0 - 100.0 fL   MCH 29.0 26.0 - 34.0 pg   MCHC 35.3 30.0 - 36.0 g/dL   RDW 12.9 11.5 - 15.5 %   Platelets 262 150 - 400 K/uL  Basic metabolic panel     Status: Abnormal   Collection Time: 06/29/17  2:33 AM  Result Value Ref Range   Sodium 131 (L) 135 - 145 mmol/L   Potassium 3.4 (L) 3.5 - 5.1 mmol/L   Chloride 98 (L) 101 - 111 mmol/L   CO2 23 22 - 32 mmol/L   Glucose, Bld 116 (H) 65 - 99 mg/dL   BUN 14 6 - 20 mg/dL   Creatinine, Ser 0.76 0.61 - 1.24 mg/dL   Calcium 8.3 (L) 8.9 - 10.3 mg/dL   GFR calc non Af Amer >60 >60 mL/min   GFR calc Af Amer >60 >60 mL/min    Comment: (NOTE) The eGFR has been calculated using the CKD EPI equation. This calculation has not been validated in all  clinical situations. eGFR's persistently <60 mL/min signify possible Chronic Kidney Disease.    Anion gap 10 5 - 15  CBC     Status: Abnormal   Collection Time: 06/30/17  8:33 AM  Result Value Ref Range   WBC 7.8 4.0 - 10.5 K/uL   RBC 4.35 4.22 - 5.81 MIL/uL   Hemoglobin 12.4 (L) 13.0 - 17.0 g/dL   HCT 36.6 (L) 39.0 - 52.0 %   MCV 84.1 78.0 - 100.0 fL   MCH 28.5 26.0 - 34.0 pg   MCHC 33.9 30.0 - 36.0 g/dL   RDW 13.0 11.5 - 15.5 %   Platelets 267 150 - 400 K/uL  Basic metabolic panel     Status: Abnormal   Collection Time: 06/30/17  8:33 AM  Result Value Ref Range   Sodium 134 (L) 135 - 145 mmol/L   Potassium 3.5 3.5 - 5.1 mmol/L   Chloride 101 101 - 111 mmol/L   CO2 25 22 - 32 mmol/L   Glucose, Bld 104 (H) 65 - 99 mg/dL   BUN 12 6 - 20 mg/dL   Creatinine, Ser 0.78 0.61 - 1.24 mg/dL   Calcium 8.4 (L) 8.9 - 10.3 mg/dL   GFR calc non Af Amer >60 >60 mL/min   GFR calc Af Amer >60 >60 mL/min    Comment: (NOTE) The eGFR has been calculated using the CKD EPI equation. This calculation has not been validated in all  clinical situations. eGFR's persistently <60 mL/min signify possible Chronic Kidney Disease.    Anion gap 8 5 - 15   Dg Chest Port 1 View  Result Date: 06/29/2017 CLINICAL DATA:  Shortness of breath, CHF EXAM: PORTABLE CHEST 1 VIEW COMPARISON:  06/25/2017 FINDINGS: Low lung volumes with bibasilar atelectasis. Heart is borderline in size. No overt edema. No effusions or acute bony abnormality. IMPRESSION: Low volumes, bibasilar atelectasis. Electronically Signed   By: Rolm Baptise M.D.   On: 06/29/2017 07:22       Medical Problem List and Plan: 1.  Decreased functional mobility secondary to right temporal ICH with IVH status post craniotomy for hematoma evacuation 06/24/2017  -admit to inpatient rehab 2.  DVT Prophylaxis/Anticoagulation: Subcutaneous heparin for DVT prophylaxis initiated 06/29/2017 3. Pain Management/back pain: Discontinue hydrocodone due to somnolence. Monitor mental status 4. Mood: Amantadine 100 mg twice a day 5. Neuropsych: This patient is not capable of making decisions on his own behalf.  -sleep chart  -limit neuro-sedating medications, suspect some of lethargy is from medication provided at night for confusion/restlessness  -schedule HS seroquel  -day time amantadine 6. Skin/Wound Care: Routine skin checks 7. Fluids/Electrolytes/Nutrition: Routine I&O's follow-up chemistries upon admission 8. Seizure prophylaxis. Valproate 500 mg every 12 hours. EEG negative 9. CAD with cardiac catheterization 03/26/2015. Continue low-dose aspirin. 10. CKD stage III. Follow-up chemistries on admission 11. Hypertension/PAF. Lisinopril 5 mg daily, Lopressor 12.5 mg twice a day. Cardiac rate controlled 12. Hyperlipidemia. Lipitor 13. Hypothyroidism. Synthroid  Post Admission Physician Evaluation: 1. Functional deficits secondary  to right temporal ICH/IVH. 2. Patient is admitted to receive collaborative, interdisciplinary care between the physiatrist, rehab nursing staff, and  therapy team. 3. Patient's level of medical complexity and substantial therapy needs in context of that medical necessity cannot be provided at a lesser intensity of care such as a SNF. 4. Patient has experienced substantial functional loss from his/her baseline which was documented above under the "Functional History" and "Functional Status" headings.  Judging by the patient's diagnosis, physical exam, and functional  history, the patient has potential for functional progress which will result in measurable gains while on inpatient rehab.  These gains will be of substantial and practical use upon discharge  in facilitating mobility and self-care at the household level. 5. Physiatrist will provide 24 hour management of medical needs as well as oversight of the therapy plan/treatment and provide guidance as appropriate regarding the interaction of the two. 6. The Preadmission Screening has been reviewed and patient status is unchanged unless otherwise stated above. 7. 24 hour rehab nursing will assist with bladder management, bowel management, safety, skin/wound care, disease management, medication administration, pain management and patient education  and help integrate therapy concepts, techniques,education, etc. 8. PT will assess and treat for/with: Lower extremity strength, range of motion, stamina, balance, functional mobility, safety, adaptive techniques and equipment, NMR, family education, vestibular assessment, day/night restoration.   Goals are: supervision to min assist. 9. OT will assess and treat for/with: ADL's, functional mobility, safety, upper extremity strength, adaptive techniques and equipment, NMR, family education, ego support, behavioral mgt.   Goals are: supervision to min assist. Therapy may not yet proceed with showering this patient. 10. SLP will assess and treat for/with: cognition, communication, behavior, family education.  Goals are: min assist to mod assist. 11. Case Management  and Social Worker will assess and treat for psychological issues and discharge planning. 12. Team conference will be held weekly to assess progress toward goals and to determine barriers to discharge. 13. Patient will receive at least 3 hours of therapy per day at least 5 days per week. 14. ELOS: 18-24 days       15. Prognosis:  excellent     Meredith Staggers, MD, [facility] 06/30/2017  Cathlyn Parsons., PA-C 06/30/2017

## 2017-06-30 NOTE — H&P (Signed)
Physical Medicine and Rehabilitation Admission H&P       Chief Complaint  Patient presents with  . Altered Mental Status  : HPI: Randy Molinariis a 83 y.o.right handed malewith history of Spinal stenosis, CAD/NSTEMI/PAF maintained on Eliquis, hypertension, CKD stage III.Per chart review patient lives with wife in [place]. Independent and driving prior to admission. One level apartment with 10 steps to entry. Wife can assist but limited physically he also has a daughter in the area that works.Presented 06/24/2017 with somnolence and increasing headache as well as bouts of nauseavomitingand chills. CT of the head showed a large right temporal intraparenchymal hemorrhage with intraventricular extension and mild surrounding edema. Hemorrhage fills the right lateral ventricle and tracks into the third, fourth and left lateral ventricles. Underwent craniotomy right temporal for evacuation of hematoma 06/24/2017 per Dr. Cyndy Freeze.Maintained on Valproate for seizure prophylaxis. Latest follow-up CT scan 06/28/2017 showing no new mass effect or midline shift. EEG completed due to some somnolence 06/29/2017 negative for seizure with somnolence felt to be possibly due to narcotics. Patient had been placed on amantadine twice a day. His Eliquis was discontinued secondary to [place] but low dose aspirin had been initiated 06/26/2017. Placed on subcutaneous heparin for DVT prophylaxis 06/29/2017. Physical and occupational therapy evaluations completed with recommendations of physical medicine rehabilitation consult. Patient was admitted for comprehensive rehabilitation program.  Review of Systems  Unable to perform ROS: Acuity of condition       Past Medical History:  Diagnosis Date  . Anemia   . CAD (coronary artery disease)    Cath 03/26/2015 100% distal LAD stenosis, 80% ostial D1 stenosis, 75% ramus stenosis, 60% RPDA stenosis, 30% proximal RCA stenosis. Medical therapy, outpt myoview to  assess LCx lesion  . Chronic kidney disease (CKD), stage III (moderate)   . Hyperlipidemia   . Hypertension   . Hypothyroidism   . Obesity (BMI 30-39.9)   . Spinal stenosis   . Thyroid nodule         Past Surgical History:  Procedure Laterality Date  . CARDIAC CATHETERIZATION N/A 03/26/2015   Procedure: Left Heart Cath and Coronary Angiography;  Surgeon: Lorretta Harp, MD;  Location: [facility];  Service: Cardiovascular;  Laterality: N/A;  . CRANIOTOMY Right 06/24/2017   Procedure: CRANIOTOMY HEMATOMA EVACUATION SUBDURAL;  Surgeon: Ashok Pall, MD;  Location: [facility];  Service: Neurosurgery;  Laterality: Right;  . PILONIDAL CYST EXCISION    . TONSILLECTOMY          Family History  Problem Relation Age of Onset  . Heart attack Father 62       cause of death  . Prostate cancer Father   . Cancer Sister   . Heart disease Sister    Social History:  reports that he has quit smoking. His smoking use included Cigars and Pipe. He smoked 0.25 packs per day. He has never used smokeless tobacco. He reports that he drinks alcohol. He reports that he does not use drugs. Allergies: No Known Allergies       Medications Prior to Admission  Medication Sig Dispense Refill  . apixaban (ELIQUIS) 5 MG TABS tablet Take 1 tablet (5 mg total) by mouth 2 (two) times daily. 60 tablet 5  . aspirin EC 81 MG tablet Take 81 mg by mouth daily.    Marland Kitchen atorvastatin (LIPITOR) 80 MG tablet Take 1 tablet (80 mg total) by mouth daily at 6 PM. 30 tablet 5  . levothyroxine (SYNTHROID, LEVOTHROID) 112 MCG  tablet Take 112 mcg by mouth daily before breakfast.    . lisinopril (PRINIVIL,ZESTRIL) 10 MG tablet Take 1 tablet (10 mg total) by mouth at bedtime. 30 tablet 5  . metoprolol tartrate (LOPRESSOR) 25 MG tablet Take 0.5 tablets (12.5 mg total) by mouth 2 (two) times daily. 30 tablet 11  . Multiple Vitamins-Minerals (CENTRUM SILVER PO) Take 1 tablet by mouth daily.    Marland Kitchen omeprazole  (PRILOSEC) 20 MG capsule Take 20 mg by mouth daily.    . tamsulosin (FLOMAX) 0.4 MG CAPS capsule Take 0.4 mg by mouth daily.    Marland Kitchen acetaminophen (TYLENOL) 325 MG tablet Take 325 mg by mouth every 6 (six) hours as needed (pain).    . fluticasone (FLONASE) 50 MCG/ACT nasal spray Place 1 spray into both nostrils daily as needed (seasonal allergies).    . Menthol-Zinc Oxide (GOLD BOND EX) Apply 1 application topically at bedtime.    . nitroGLYCERIN (NITROSTAT) 0.4 MG SL tablet Place 1 tablet (0.4 mg total) under the tongue every 5 (five) minutes x 3 doses as needed for chest pain. 25 tablet 6  . sildenafil (VIAGRA) 50 MG tablet Take 50 mg by mouth daily as needed for erectile dysfunction. Take 1 hour prior to sexual activity ( do not exceed 1 dose per 24 hour period)       Home: Home Living Family/patient expects to be discharged to:: Inpatient rehab Living Arrangements: Spouse/significant other Additional Comments: Pt's family is very interested in CIR   Functional History: Prior Function Level of Independence: Independent  Functional Status:  Mobility: Bed Mobility Overal bed mobility: Needs Assistance Bed Mobility: Rolling, Supine to Sit, Sit to Supine Rolling: Max assist, +2 for physical assistance Supine to sit: Total assist, +2 for physical assistance Sit to supine: Total assist General bed mobility comments: Pt in chair upon arrival Transfers Overall transfer level: Needs assistance Equipment used: None Transfers: Sit to/from Stand Sit to Stand: Total assist, +2 physical assistance, Max assist Stand pivot transfers: Max assist, Total assist, +2 physical assistance, +2 safety/equipment, From elevated surface General transfer comment: Using bed pad to list hips off surface. Max A to lean forward and push upward. Unable to reach standing Ambulation/Gait General Gait Details: unable  ADL: ADL Overall ADL's : Needs assistance/impaired Eating/Feeding: Maximal  assistance, Sitting, Cueing for sequencing Eating/Feeding Details (indicate cue type and reason): Pt performed self feeding with Max A and Max cueing; optimized sitting postion with supportive seating and pillows. Pt able to grasp OT's hand holding feeding utensil and bring hand from bowl to mouth. Pt benefited from tactil cues to bicept to cue for elbow flexion and bring utensil to mouth. Pt also required hand over hand for drinking out of cup. Pt requiring Max verbal cues to increase participation and engagement due to pt's lethargy. Family reports that pt was able to fall asleep quickly PTA.  Grooming: Wash/dry face, Brushing hair, Maximal assistance, Sitting, Cueing for sequencing Grooming Details (indicate cue type and reason): Pt requiring Max hand over hand A to perform grooming tasks and Max tactile and verbal cues to engage in ADL.  General ADL Comments: Focused session on increasing pt's occupational participation and engagement. Pt performed self feeding and grooming tasks while seated in recliner with optimized support for sitting position. Pt required Max A and Max cuing to optimize engagment. Pt follow simple direct cues with increased time (inconsistantly).   Cognition: Cognition Overall Cognitive Status: Difficult to assess Arousal/Alertness: Awake/alert Orientation Level: Oriented to person Attention: Selective  Selective Attention: Impaired Selective Attention Impairment: Verbal basic, Functional basic Memory: Impaired Memory Impairment: Other (comment) (Attention deficits impede working memory) Cognition Arousal/Alertness: Lethargic Behavior During Therapy: WFL for tasks assessed/performed Overall Cognitive Status: Difficult to assess Area of Impairment: Attention, Memory, Following commands, Safety/judgement, Awareness, Problem solving Orientation Level: Time, Situation Current Attention Level: Focused Memory: Decreased short-term memory, Decreased recall of  precautions Following Commands: Follows one step commands with increased time, Follows one step commands inconsistently Safety/Judgement: Decreased awareness of safety, Decreased awareness of deficits Awareness: Intellectual Problem Solving: Slow processing, Decreased initiation, Difficulty sequencing, Requires verbal cues, Requires tactile cues General Comments: Pt responding to his name. Answered a few simple yes/no questions. Pt following simple direct prompts inconsistantly.  Difficult to assess due to: Level of arousal  Physical Exam: Blood pressure 134/79, pulse 80, temperature 98.1 F (36.7 C), temperature source Oral, resp. rate 18, height _0  (1.6 m), weight 81.6 kg (180 lb), SpO2 98 %. Physical Exam  Constitutional: No distress.  HENT:  Head: Normocephalic.  Mouth/Throat: Oropharynx is clear and moist.  Eyes:  Pupils are pinpoint  Neck: Normal range of motion. Neck supple. No JVD present. No tracheal deviation present. No thyromegaly present.  Cardiovascular: Normal rate.  Exam reveals no friction rub.   No murmur heard. Cardiac rate controlled  Respiratory: Effort normal and breath sounds normal. No respiratory distress. He has no wheezes. He has no rales.  GI: Soft. Bowel sounds are normal. He exhibits no distension. There is no tenderness. There is no rebound.  Skin: Skin is warm.   Skin warm and dry. Craniotomy site clean and dry Neurological. Patient is lethargic but arousable. He will answer yes no simple questions with some inaccuracy. Does provide his name when asked. I asked him where he is, and he is unable to tell me even when given choices. Pt will typically not open eye lids. I held them open and he made eye contact with me and family, some tracking. Pupils pinpoint. Moves all 4's spontaneously. Speech more clear. Withdraws to pain in all 4's.  Psych: pleasantly confused    Lab Results Last 48 Hours        Results for orders placed or performed during the  hospital encounter of 06/23/17 (from the past 48 hour(s))  CBC     Status: Abnormal   Collection Time: 06/29/17  2:33 AM  Result Value Ref Range   WBC 8.5 4.0 - 10.5 K/uL   RBC 4.20 (L) 4.22 - 5.81 MIL/uL   Hemoglobin 12.2 (L) 13.0 - 17.0 g/dL   HCT 34.6 (L) 39.0 - 52.0 %   MCV 82.4 78.0 - 100.0 fL   MCH 29.0 26.0 - 34.0 pg   MCHC 35.3 30.0 - 36.0 g/dL   RDW 12.9 11.5 - 15.5 %   Platelets 262 150 - 400 K/uL  Basic metabolic panel     Status: Abnormal   Collection Time: 06/29/17  2:33 AM  Result Value Ref Range   Sodium 131 (L) 135 - 145 mmol/L   Potassium 3.4 (L) 3.5 - 5.1 mmol/L   Chloride 98 (L) 101 - 111 mmol/L   CO2 23 22 - 32 mmol/L   Glucose, Bld 116 (H) 65 - 99 mg/dL   BUN 14 6 - 20 mg/dL   Creatinine, Ser 0.76 0.61 - 1.24 mg/dL   Calcium 8.3 (L) 8.9 - 10.3 mg/dL   GFR calc non Af Amer >60 >60 mL/min   GFR calc Af Amer >60 >60 mL/min  Comment: (NOTE) The eGFR has been calculated using the CKD EPI equation. This calculation has not been validated in all clinical situations. eGFR's persistently <60 mL/min signify possible Chronic Kidney Disease.    Anion gap 10 5 - 15  CBC     Status: Abnormal   Collection Time: 06/30/17  8:33 AM  Result Value Ref Range   WBC 7.8 4.0 - 10.5 K/uL   RBC 4.35 4.22 - 5.81 MIL/uL   Hemoglobin 12.4 (L) 13.0 - 17.0 g/dL   HCT 36.6 (L) 39.0 - 52.0 %   MCV 84.1 78.0 - 100.0 fL   MCH 28.5 26.0 - 34.0 pg   MCHC 33.9 30.0 - 36.0 g/dL   RDW 13.0 11.5 - 15.5 %   Platelets 267 150 - 400 K/uL  Basic metabolic panel     Status: Abnormal   Collection Time: 06/30/17  8:33 AM  Result Value Ref Range   Sodium 134 (L) 135 - 145 mmol/L   Potassium 3.5 3.5 - 5.1 mmol/L   Chloride 101 101 - 111 mmol/L   CO2 25 22 - 32 mmol/L   Glucose, Bld 104 (H) 65 - 99 mg/dL   BUN 12 6 - 20 mg/dL   Creatinine, Ser 0.78 0.61 - 1.24 mg/dL   Calcium 8.4 (L) 8.9 - 10.3 mg/dL   GFR calc non Af Amer >60 >60 mL/min    GFR calc Af Amer >60 >60 mL/min    Comment: (NOTE) The eGFR has been calculated using the CKD EPI equation. This calculation has not been validated in all clinical situations. eGFR's persistently <60 mL/min signify possible Chronic Kidney Disease.    Anion gap 8 5 - 15      Imaging Results (Last 48 hours)  Dg Chest Port 1 View  Result Date: 06/29/2017 CLINICAL DATA:  Shortness of breath, CHF EXAM: PORTABLE CHEST 1 VIEW COMPARISON:  06/25/2017 FINDINGS: Low lung volumes with bibasilar atelectasis. Heart is borderline in size. No overt edema. No effusions or acute bony abnormality. IMPRESSION: Low volumes, bibasilar atelectasis. Electronically Signed   By: Rolm Baptise M.D.   On: 06/29/2017 07:22        Medical Problem List and Plan: 1.  Decreased functional mobility secondary to right temporal ICH with IVH status post craniotomy for hematoma evacuation 06/24/2017             -admit to inpatient rehab 2.  DVT Prophylaxis/Anticoagulation: Subcutaneous heparin for DVT prophylaxis initiated 06/29/2017 3. Pain Management/back pain: Discontinue hydrocodone due to somnolence. Monitor mental status 4. Mood: Amantadine 100 mg twice a day 5. Neuropsych: This patient is not capable of making decisions on his own behalf.             -sleep chart             -limit neuro-sedating medications, suspect some of lethargy is from medication provided at night for confusion/restlessness             -schedule HS seroquel             -day time amantadine 6. Skin/Wound Care: Routine skin checks 7. Fluids/Electrolytes/Nutrition: Routine I&O's follow-up chemistries upon admission 8. Seizure prophylaxis. Valproate 500 mg every 12 hours. EEG negative 9. CAD with cardiac catheterization 03/26/2015. Continue low-dose aspirin. 10. CKD stage III. Follow-up chemistries on admission 11. Hypertension/PAF. Lisinopril 5 mg daily, Lopressor 12.5 mg twice a day. Cardiac rate controlled 12. Hyperlipidemia.  Lipitor 13. Hypothyroidism. Synthroid  Post Admission Physician Evaluation: 1. Functional deficits  secondary  to right temporal ICH/IVH. 2. Patient is admitted to receive collaborative, interdisciplinary care between the physiatrist, rehab nursing staff, and therapy team. 3. Patient's level of medical complexity and substantial therapy needs in context of that medical necessity cannot be provided at a lesser intensity of care such as a SNF. 4. Patient has experienced substantial functional loss from his/her baseline which was documented above under the "Functional History" and "Functional Status" headings.  Judging by the patient's diagnosis, physical exam, and functional history, the patient has potential for functional progress which will result in measurable gains while on inpatient rehab.  These gains will be of substantial and practical use upon discharge  in facilitating mobility and self-care at the household level. 5. Physiatrist will provide 24 hour management of medical needs as well as oversight of the therapy plan/treatment and provide guidance as appropriate regarding the interaction of the two. 6. The Preadmission Screening has been reviewed and patient status is unchanged unless otherwise stated above. 7. 24 hour rehab nursing will assist with bladder management, bowel management, safety, skin/wound care, disease management, medication administration, pain management and patient education  and help integrate therapy concepts, techniques,education, etc. 8. PT will assess and treat for/with: Lower extremity strength, range of motion, stamina, balance, functional mobility, safety, adaptive techniques and equipment, NMR, family education, vestibular assessment, day/night restoration.   Goals are: supervision to min assist. 9. OT will assess and treat for/with: ADL's, functional mobility, safety, upper extremity strength, adaptive techniques and equipment, NMR, family education, ego support,  behavioral mgt.   Goals are: supervision to min assist. Therapy may not yet proceed with showering this patient. 10. SLP will assess and treat for/with: cognition, communication, behavior, family education.  Goals are: min assist to mod assist. 11. Case Management and Social Worker will assess and treat for psychological issues and discharge planning. 12. Team conference will be held weekly to assess progress toward goals and to determine barriers to discharge. 13. Patient will receive at least 3 hours of therapy per day at least 5 days per week. 14. ELOS: 18-24 days       15. Prognosis:  excellent     Meredith Staggers, MD, [facility] 06/30/2017  Cathlyn Parsons., PA-C 06/30/2017

## 2017-06-30 NOTE — Progress Notes (Signed)
CSW alerted by Rehab Admission Coordinator that patient will admit to CIR today. CSW no longer needed for discharge planning.  CSW signing off.  Blenda Nicely, [place] Clinical Social Worker [phone]

## 2017-06-30 NOTE — H&P (Signed)
Patient is being transferred to4W. Report was given to debra RN

## 2017-06-30 NOTE — Progress Notes (Signed)
Patient ID: Randy Durham, male   DOB: 1934-06-06, 83 y.o.   MRN: [id number] Admitted to unit via bed, oriented to unit and rehab schedule. Reviewed medication and routine with pt and family. Caregivers and plan of care. Pamelia Hoit

## 2017-06-30 NOTE — Progress Notes (Signed)
I can admit pt to inpt rehab today. I contacted Dr. Roda Shutters, RN CM and SW . Family are at bedside and are in agreement to admit. I will make the arraignments to admit today. [phone]

## 2017-06-30 NOTE — Progress Notes (Signed)
Standley Brooking, RN Rehab Admission Coordinator Signed Physical Medicine and Rehabilitation  PMR Pre-admission Date of Service: 06/30/2017 2:58 PM  Related encounter: ED to Hosp-Admission (Current) from 06/23/2017 in [facility]       [] Hide copied text PMR Admission Coordinator Pre-Admission Assessment  Patient: Anoop Durham is an 83 y.o., male MRN: [id number] DOB: Sep 20, 1934 Height: 5\' 3"  (160 cm) Weight: 81.6 kg (180 lb)                                                                                                                                                  Insurance Information HMO:     PPO:      PCP:      IPA:      80/20: yes     OTHER:  No HMO PRIMARY: medicare a and b      Policy#: [id number]      Subscriber: pt Benefits:  Phone #: passport one online     Name: 06/30/17 Eff. Date: 04/03/2001     Deduct: $1340      Out of Pocket Max: none      Life Max: none CIR: 100%      SNF: 20 full days Outpatient: 80%     Co-Pay: 20% Home Health: 100%      Co-Pay: none DME: 80%     Co-Pay: 20% Providers: pt choice  SECONDARY: A[employer]       Policy#: [id number]      Subscriber: pt  Medicaid Application Date:       Case Manager:  Disability Application Date:       Case Worker:   Emergency Contact Information        Contact Information    Name Relation Home Work Mobile   Mastropietro,Lucille Spouse [phone]       Current Medical History  Patient Admitting Diagnosis: right temporal ICH  History of Present Illness: :  Randy Molinariis a 83 y.o.right handed malewith history of Spinal stenosis, CAD/NSTEMI/PAF maintained on Eliquis, hypertension, CKD stage III.Presented 06/24/2017 with somnolence and increasing headache as well as bouts of nauseavomitingand chills. CT of the head showed a large right temporal intraparenchymal hemorrhage with intraventricular extension and mild surrounding edema. Hemorrhage fills the right lateral  ventricle and tracks into the third, fourth and left lateral ventricles. Underwent craniotomy right temporal forevacuation of hematoma 06/24/2017 per Dr. Mikal Plane.Maintained on Valproatefor seizure prophylaxis. Latest follow-up CT scan 06/28/2017 showing no new mass effect or midline shift. EEG completed due to some somnolence 06/29/2017 negative for seizure with somnolence felt to be possibly due to narcotics. Patient had been placed on amantadine twice a day. His Eliquiswas discontinued secondary to ICH but low dose aspirin had been initiated 06/26/2017. Placed on subcutaneous heparin for DVT prophylaxis 06/29/2017.   Total: 18 NIHSS  Past Medical  History      Past Medical History:  Diagnosis Date  . Anemia   . CAD (coronary artery disease)    Cath 03/26/2015 100% distal LAD stenosis, 80% ostial D1 stenosis, 75% ramus stenosis, 60% RPDA stenosis, 30% proximal RCA stenosis. Medical therapy, outpt myoview to assess LCx lesion  . Chronic kidney disease (CKD), stage III (moderate)   . Hyperlipidemia   . Hypertension   . Hypothyroidism   . Obesity (BMI 30-39.9)   . Spinal stenosis   . Thyroid nodule     Family History  family history includes Cancer in his sister; Heart attack (age of onset: 1) in his father; Heart disease in his sister; Prostate cancer in his father.  Prior Rehab/Hospitalizations:  Has the patient had major surgery during 100 days prior to admission? No  Current Medications   Current Facility-Administered Medications:  .   stroke: mapping our early stages of recovery book, , Does not apply, Once, Caryl Pina, MD .  0.9 % NaCl with KCl 20 mEq/ L  infusion, , Intravenous, Continuous, Cabbell, Kyle, MD, Last Rate: 80 mL/hr at 06/30/17 1049 .  acetaminophen (TYLENOL) tablet 650 mg, 650 mg, Oral, Q4H PRN, 650 mg at 06/29/17 1453 **OR** acetaminophen (TYLENOL) solution 650 mg, 650 mg, Per Tube, Q4H PRN **OR** acetaminophen (TYLENOL) suppository 650 mg,  650 mg, Rectal, Q4H PRN, Marvel Plan, MD .  alum & mag hydroxide-simeth (MAALOX/MYLANTA) 200-200-20 MG/5ML suspension 15 mL, 15 mL, Oral, Q6H PRN, Coletta Memos, MD, 15 mL at 06/26/17 1753 .  amantadine (SYMMETREL) 50 MG/5ML solution 100 mg, 100 mg, Oral, BID, Marvel Plan, MD, 100 mg at 06/30/17 1415 .  aspirin chewable tablet 81 mg, 81 mg, Oral, Daily, Patteson, Samuel A, NP, 81 mg at 06/30/17 1049 .  atorvastatin (LIPITOR) tablet 80 mg, 80 mg, Oral, q1800, Marvel Plan, MD .  feeding supplement (ENSURE ENLIVE) (ENSURE ENLIVE) liquid 237 mL, 237 mL, Oral, TID BM, Marvel Plan, MD, 237 mL at 06/30/17 1400 .  heparin injection 5,000 Units, 5,000 Units, Subcutaneous, Q8H, Marvel Plan, MD, 5,000 Units at 06/30/17 1415 .  hydrALAZINE (APRESOLINE) injection 5 mg, 5 mg, Intravenous, Q4H PRN, Patteson, Samuel A, NP, 5 mg at 06/28/17 1507 .  HYDROcodone-acetaminophen (NORCO/VICODIN) 5-325 MG per tablet 1 tablet, 1 tablet, Oral, Q4H PRN, Patteson, Samuel A, NP, 1 tablet at 06/30/17 0616 .  labetalol (NORMODYNE,TRANDATE) injection 10-40 mg, 10-40 mg, Intravenous, Q10 min PRN, Coletta Memos, MD, 20 mg at 06/28/17 1507 .  levothyroxine (SYNTHROID, LEVOTHROID) tablet 112 mcg, 112 mcg, Per Tube, QAC breakfast, Patteson, Samuel A, NP, 112 mcg at 06/30/17 0839 .  lisinopril (PRINIVIL,ZESTRIL) tablet 5 mg, 5 mg, Per Tube, QHS, Patteson, Samuel A, NP, 5 mg at 06/29/17 2205 .  metoCLOPramide (REGLAN) injection 10 mg, 10 mg, Intravenous, Q6H PRN, Patteson, Samuel A, NP .  metoprolol tartrate (LOPRESSOR) tablet 12.5 mg, 12.5 mg, Per Tube, BID, Patteson, Samuel A, NP, 12.5 mg at 06/30/17 1049 .  nitroGLYCERIN (NITROSTAT) SL tablet 0.4 mg, 0.4 mg, Sublingual, Q5 Min x 3 PRN, Caryl Pina, MD .  [DISCONTINUED] ondansetron (ZOFRAN) tablet 4 mg, 4 mg, Oral, Q4H PRN **OR** ondansetron (ZOFRAN) injection 4 mg, 4 mg, Intravenous, Q4H PRN, Coletta Memos, MD, 4 mg at 06/25/17 2205 .  pantoprazole (PROTONIX) EC tablet 40 mg, 40 mg,  Oral, QHS, Micki Riley, MD, 40 mg at 06/29/17 2205 .  promethazine (PHENERGAN) tablet 12.5-25 mg, 12.5-25 mg, Oral, Q4H PRN, Coletta Memos, MD .  senna-docusate (Senokot-S) tablet  1 tablet, 1 tablet, Oral, BID, Caryl Pina, MD, 1 tablet at 06/30/17 1049 .  valproate (DEPACON) 500 mg in dextrose 5 % 50 mL IVPB, 500 mg, Intravenous, Q12H, Caryl Pina, MD, Stopped at 06/30/17 1157  Patients Current Diet: Diet full liquid Room service appropriate? Yes; Fluid consistency: Thin  Precautions / Restrictions Precautions Precautions: Fall Restrictions Weight Bearing Restrictions: No Other Position/Activity Restrictions: supine to side to sit slowly due to symptoms   Has the patient had 2 or more falls or a fall with injury in the past year?No  Prior Activity Level Community (5-7x/wk): Independent and driving pta; active and retired  Journalist, newspaper / Corporate investment banker Devices/Equipment: None  Prior Device Use: Indicate devices/aids used by the patient prior to current illness, exacerbation or injury? None of the above  Prior Functional Level Prior Function Level of Independence: Independent  Self Care: Did the patient need help bathing, dressing, using the toilet or eating?  Independent  Indoor Mobility: Did the patient need assistance with walking from room to room (with or without device)? Independent  Stairs: Did the patient need assistance with internal or external stairs (with or without device)? Independent  Functional Cognition: Did the patient need help planning regular tasks such as shopping or remembering to take medications? Independent  Current Functional Level Cognition  Arousal/Alertness: Awake/alert Overall Cognitive Status: Difficult to assess Difficult to assess due to: Level of arousal Current Attention Level: Focused Orientation Level: Oriented to person Following Commands: Follows one step commands with increased time, Follows  one step commands inconsistently Safety/Judgement: Decreased awareness of safety, Decreased awareness of deficits General Comments: Pt responding to his name. Answered a few simple yes/no questions. Pt following simple direct prompts inconsistantly.  Attention: Selective Selective Attention: Impaired Selective Attention Impairment: Verbal basic, Functional basic Memory: Impaired Memory Impairment: Other (comment) (Attention deficits impede working memory)    Extremity Assessment (includes Sensation/Coordination)  Upper Extremity Assessment: Generalized weakness, Difficult to assess due to impaired cognition  Lower Extremity Assessment: Defer to PT evaluation    ADLs  Overall ADL's : Needs assistance/impaired Eating/Feeding: Maximal assistance, Sitting, Cueing for sequencing Eating/Feeding Details (indicate cue type and reason): Pt performed self feeding with Max A and Max cueing; optimized sitting postion with supportive seating and pillows. Pt able to grasp OT's hand holding feeding utensil and bring hand from bowl to mouth. Pt benefited from tactil cues to bicept to cue for elbow flexion and bring utensil to mouth. Pt also required hand over hand for drinking out of cup. Pt requiring Max verbal cues to increase participation and engagement due to pt's lethargy. Family reports that pt was able to fall asleep quickly PTA.  Grooming: Wash/dry face, Brushing hair, Maximal assistance, Sitting, Cueing for sequencing Grooming Details (indicate cue type and reason): Pt requiring Max hand over hand A to perform grooming tasks and Max tactile and verbal cues to engage in ADL.  General ADL Comments: Focused session on increasing pt's occupational participation and engagement. Pt performed self feeding and grooming tasks while seated in recliner with optimized support for sitting position. Pt required Max A and Max cuing to optimize engagment. Pt follow simple direct cues with increased time  (inconsistantly).     Mobility  Overal bed mobility: Needs Assistance Bed Mobility: Rolling, Supine to Sit, Sit to Supine Rolling: Max assist, +2 for physical assistance Supine to sit: Total assist, +2 for physical assistance Sit to supine: Total assist General bed mobility comments: Pt in chair upon arrival  Transfers  Overall transfer level: Needs assistance Equipment used: None Transfers: Sit to/from Stand Sit to Stand: Total assist, +2 physical assistance, Max assist Stand pivot transfers: Max assist, Total assist, +2 physical assistance, +2 safety/equipment, From elevated surface General transfer comment: Using bed pad to list hips off surface. Max A to lean forward and push upward. Unable to reach standing    Ambulation / Gait / Stairs / Wheelchair Mobility  Ambulation/Gait General Gait Details: unable    Posture / Balance Dynamic Sitting Balance Sitting balance - Comments: lists to the left during EOb activity, poor ability to arouse, minimal engagement Balance Overall balance assessment: Needs assistance Sitting-balance support: Feet supported, Bilateral upper extremity supported Sitting balance-Leahy Scale: Poor Sitting balance - Comments: lists to the left during EOb activity, poor ability to arouse, minimal engagement Postural control: Left lateral lean Standing balance support: During functional activity, No upper extremity supported Standing balance-Leahy Scale: Zero Standing balance comment: Total A +2 for attempt at sit<>Stand to provide pressure relief    Special needs/care consideration BiPAP/CPAP  N/a CPM  N/a Continuous Drip IV  N/a Dialysis  N/a Life Vest  N/a Oxygen  N/a Special Bed  N/a Trach Size  N/a Wound Vac n/a Skin  Head surgical incision with staples Bowel mgmt: incontinent LBM 06/30/17 Bladder mgmt: incontinent Diabetic mgmt n/a Hgb A1c 5.6   Previous Home Environment Living Arrangements: Spouse/significant other  Lives With:  Spouse Available Help at Discharge: Available 24 hours/day Type of Home: Apartment Home Layout: One level Home Access: Stairs to enter [employer]:  (two entrances with same amount of steps. Rail on one side an) Secretary/administrator of Steps: 16 steps to upstairs apartment Bathroom Shower/Tub: Hydrographic surveyor, Engineer, building services: Pharmacist, community: Yes How Accessible: Accessible via walker Home Care Services: No Additional Comments: Pt's family is very interested in CIR  Discharge Living Setting Plans for Discharge Living Setting: Patient's home, Lives with (comment), Apartment (wife) Type of Home at Discharge: Apartment Discharge Home Layout: One level Discharge Home Access: Stairs to enter Entrance Stairs-Rails:  (two entrances with same amount of steps. 16 with rails on op) Entrance Stairs-Number of Steps: 16 Discharge Bathroom Shower/Tub: Tub/shower unit, Curtain Discharge Bathroom Toilet: Standard Discharge Bathroom Accessibility: Yes How Accessible: Accessible via walker Does the patient have any problems obtaining your medications?: No  Social/Family/Support Systems Patient Roles: Spouse, Parent Contact Information: [name], wife Anticipated Caregiver: wife and daughter Anticipated Caregiver's Contact Information: see above Caregiver Availability: 24/7 Discharge Plan Discussed with Primary Caregiver: Yes Is Caregiver In Agreement with Plan?: Yes Does Caregiver/Family have Issues with Lodging/Transportation while Pt is in Rehab?: No (Family stay with him 24/7 in hospital; son from [place] i s)  Goals/Additional Needs Patient/Family Goal for Rehab: supervision PT, supervision to min assist OT and SLP Expected length of stay: ELOS 18-24 days Pt/Family Agrees to Admission and willing to participate: Yes Program Orientation Provided & Reviewed with Pt/Caregiver Including Roles  & Responsibilities: Yes  Barriers to Discharge: Home  environment access/layout, Incontinence  Decrease burden of Care through IP rehab admission: n/a  Possible need for SNF placement upon discharge: yes if pt unable to reach level to be managed at home.  Patient Condition: This patient's medical and functional status has changed since the consult dated: 06/26/2017 in which the Rehabilitation Physician determined and documented that the patient's condition is appropriate for intensive rehabilitative care in an inpatient rehabilitation facility. See "History of Present Illness" (above) for medical update. Functional changes are: overall  max assist. Patient's medical and functional status update has been discussed with the Rehabilitation physician and patient remains appropriate for inpatient rehabilitation. Will admit to inpatient rehab today.  Preadmission Screen Completed By:  Clois Dupes, 06/30/2017 4:05 PM ______________________________________________________________________   Discussed status with Dr. Riley Kill on 06/30/2017 at  1605 and received telephone approval for admission today.  Admission Coordinator:  Clois Dupes, time 0102 Date 06/30/2017       Cosigned by: [name], MD at 06/30/2017 4:08 PM  Revision History

## 2017-06-30 NOTE — PMR Pre-admission (Signed)
PMR Admission Coordinator Pre-Admission Assessment  Patient: Randy Durham is an 83 y.o., male MRN: [id number] DOB: [birth year] Height: 5\' 3"  (160 cm) Weight: 81.6 kg (180 lb)              Insurance Information HMO:     PPO:      PCP:      IPA:      80/20: yes     OTHER:  No HMO PRIMARY: medicare a and b      Policy#: [id number]      Subscriber: pt Benefits:  Phone #: passport one online     Name: 06/30/17 Eff. Date: 04/03/2001     Deduct: $1340      Out of Pocket Max: none      Life Max: none CIR: 100%      SNF: 20 full days Outpatient: 80%     Co-Pay: 20% Home Health: 100%      Co-Pay: none DME: 80%     Co-Pay: 20% Providers: pt choice  SECONDARY: [employer]       Policy#: [id number]      Subscriber: pt  Medicaid Application Date:       Case Manager:  Disability Application Date:       Case Worker:   Emergency Contact Information Contact Information    Name Relation Home Work Mobile   Reinhold,Lucille Spouse [phone]       Current Medical History  Patient Admitting Diagnosis: right temporal ICH  History of Present Illness: :  Gemayel Molinariis a 83 y.o.right handed malewith history of Spinal stenosis, CAD/NSTEMI/PAF maintained on Eliquis, hypertension, CKD stage III.Presented 06/24/2017 with somnolence and increasing headache as well as bouts of nauseavomitingand chills. CT of the head showed a large right temporal intraparenchymal hemorrhage with intraventricular extension and mild surrounding edema. Hemorrhage fills the right lateral ventricle and tracks into the third, fourth and left lateral ventricles. Underwent craniotomy right temporal for evacuation of hematoma 06/24/2017 per Dr. Mikal Plane.Maintained on Valproate for seizure prophylaxis. Latest follow-up CT scan 06/28/2017 showing no new mass effect or midline shift. EEG completed due to some somnolence 06/29/2017 negative for seizure with somnolence felt to be possibly due to narcotics. Patient had been placed on  amantadine twice a day. His Eliquis was discontinued secondary to ICH but low dose aspirin had been initiated 06/26/2017. Placed on subcutaneous heparin for DVT prophylaxis 06/29/2017.   Total: 18 NIHSS  Past Medical History  Past Medical History:  Diagnosis Date  . Anemia   . CAD (coronary artery disease)    Cath 03/26/2015 100% distal LAD stenosis, 80% ostial D1 stenosis, 75% ramus stenosis, 60% RPDA stenosis, 30% proximal RCA stenosis. Medical therapy, outpt myoview to assess LCx lesion  . Chronic kidney disease (CKD), stage III (moderate)   . Hyperlipidemia   . Hypertension   . Hypothyroidism   . Obesity (BMI 30-39.9)   . Spinal stenosis   . Thyroid nodule     Family History  family history includes Cancer in his sister; Heart attack (age of onset: 43) in his father; Heart disease in his sister; Prostate cancer in his father.  Prior Rehab/Hospitalizations:  Has the patient had major surgery during 100 days prior to admission? No  Current Medications   Current Facility-Administered Medications:  .   stroke: mapping our early stages of recovery book, , Does not apply, Once, Caryl Pina, MD .  0.9 % NaCl with KCl 20 mEq/ L  infusion, , Intravenous, Continuous, Coletta Memos, MD, Last Rate:  80 mL/hr at 06/30/17 1049 .  acetaminophen (TYLENOL) tablet 650 mg, 650 mg, Oral, Q4H PRN, 650 mg at 06/29/17 1453 **OR** acetaminophen (TYLENOL) solution 650 mg, 650 mg, Per Tube, Q4H PRN **OR** acetaminophen (TYLENOL) suppository 650 mg, 650 mg, Rectal, Q4H PRN, Marvel Plan, MD .  alum & mag hydroxide-simeth (MAALOX/MYLANTA) 200-200-20 MG/5ML suspension 15 mL, 15 mL, Oral, Q6H PRN, Coletta Memos, MD, 15 mL at 06/26/17 1753 .  amantadine (SYMMETREL) 50 MG/5ML solution 100 mg, 100 mg, Oral, BID, Marvel Plan, MD, 100 mg at 06/30/17 1415 .  aspirin chewable tablet 81 mg, 81 mg, Oral, Daily, Patteson, Samuel A, NP, 81 mg at 06/30/17 1049 .  atorvastatin (LIPITOR) tablet 80 mg, 80 mg, Oral, q1800,  Marvel Plan, MD .  feeding supplement (ENSURE ENLIVE) (ENSURE ENLIVE) liquid 237 mL, 237 mL, Oral, TID BM, Marvel Plan, MD, 237 mL at 06/30/17 1400 .  heparin injection 5,000 Units, 5,000 Units, Subcutaneous, Q8H, Marvel Plan, MD, 5,000 Units at 06/30/17 1415 .  hydrALAZINE (APRESOLINE) injection 5 mg, 5 mg, Intravenous, Q4H PRN, Patteson, Samuel A, NP, 5 mg at 06/28/17 1507 .  HYDROcodone-acetaminophen (NORCO/VICODIN) 5-325 MG per tablet 1 tablet, 1 tablet, Oral, Q4H PRN, Patteson, Samuel A, NP, 1 tablet at 06/30/17 0616 .  labetalol (NORMODYNE,TRANDATE) injection 10-40 mg, 10-40 mg, Intravenous, Q10 min PRN, Coletta Memos, MD, 20 mg at 06/28/17 1507 .  levothyroxine (SYNTHROID, LEVOTHROID) tablet 112 mcg, 112 mcg, Per Tube, QAC breakfast, Patteson, Samuel A, NP, 112 mcg at 06/30/17 0839 .  lisinopril (PRINIVIL,ZESTRIL) tablet 5 mg, 5 mg, Per Tube, QHS, Patteson, Samuel A, NP, 5 mg at 06/29/17 2205 .  metoCLOPramide (REGLAN) injection 10 mg, 10 mg, Intravenous, Q6H PRN, Patteson, Samuel A, NP .  metoprolol tartrate (LOPRESSOR) tablet 12.5 mg, 12.5 mg, Per Tube, BID, Patteson, Samuel A, NP, 12.5 mg at 06/30/17 1049 .  nitroGLYCERIN (NITROSTAT) SL tablet 0.4 mg, 0.4 mg, Sublingual, Q5 Min x 3 PRN, Caryl Pina, MD .  [DISCONTINUED] ondansetron (ZOFRAN) tablet 4 mg, 4 mg, Oral, Q4H PRN **OR** ondansetron (ZOFRAN) injection 4 mg, 4 mg, Intravenous, Q4H PRN, Coletta Memos, MD, 4 mg at 06/25/17 2205 .  pantoprazole (PROTONIX) EC tablet 40 mg, 40 mg, Oral, QHS, Micki Riley, MD, 40 mg at 06/29/17 2205 .  promethazine (PHENERGAN) tablet 12.5-25 mg, 12.5-25 mg, Oral, Q4H PRN, Coletta Memos, MD .  senna-docusate (Senokot-S) tablet 1 tablet, 1 tablet, Oral, BID, Caryl Pina, MD, 1 tablet at 06/30/17 1049 .  valproate (DEPACON) 500 mg in dextrose 5 % 50 mL IVPB, 500 mg, Intravenous, Q12H, Caryl Pina, MD, Stopped at 06/30/17 1157  Patients Current Diet: Diet full liquid Room service appropriate? Yes;  Fluid consistency: Thin  Precautions / Restrictions Precautions Precautions: Fall Restrictions Weight Bearing Restrictions: No Other Position/Activity Restrictions: supine to side to sit slowly due to symptoms   Has the patient had 2 or more falls or a fall with injury in the past year?No  Prior Activity Level Community (5-7x/wk): Independent and driving pta; active and retired  Journalist, newspaper / Corporate investment banker Devices/Equipment: None  Prior Device Use: Indicate devices/aids used by the patient prior to current illness, exacerbation or injury? None of the above  Prior Functional Level Prior Function Level of Independence: Independent  Self Care: Did the patient need help bathing, dressing, using the toilet or eating?  Independent  Indoor Mobility: Did the patient need assistance with walking from room to room (with or without device)? Independent  Stairs: Did  the patient need assistance with internal or external stairs (with or without device)? Independent  Functional Cognition: Did the patient need help planning regular tasks such as shopping or remembering to take medications? Independent  Current Functional Level Cognition  Arousal/Alertness: Awake/alert Overall Cognitive Status: Difficult to assess Difficult to assess due to: Level of arousal Current Attention Level: Focused Orientation Level: Oriented to person Following Commands: Follows one step commands with increased time, Follows one step commands inconsistently Safety/Judgement: Decreased awareness of safety, Decreased awareness of deficits General Comments: Pt responding to his name. Answered a few simple yes/no questions. Pt following simple direct prompts inconsistantly.  Attention: Selective Selective Attention: Impaired Selective Attention Impairment: Verbal basic, Functional basic Memory: Impaired Memory Impairment: Other (comment) (Attention deficits impede working memory)     Extremity Assessment (includes Sensation/Coordination)  Upper Extremity Assessment: Generalized weakness, Difficult to assess due to impaired cognition  Lower Extremity Assessment: Defer to PT evaluation    ADLs  Overall ADL's : Needs assistance/impaired Eating/Feeding: Maximal assistance, Sitting, Cueing for sequencing Eating/Feeding Details (indicate cue type and reason): Pt performed self feeding with Max A and Max cueing; optimized sitting postion with supportive seating and pillows. Pt able to grasp OT's hand holding feeding utensil and bring hand from bowl to mouth. Pt benefited from tactil cues to bicept to cue for elbow flexion and bring utensil to mouth. Pt also required hand over hand for drinking out of cup. Pt requiring Max verbal cues to increase participation and engagement due to pt's lethargy. Family reports that pt was able to fall asleep quickly PTA.  Grooming: Wash/dry face, Brushing hair, Maximal assistance, Sitting, Cueing for sequencing Grooming Details (indicate cue type and reason): Pt requiring Max hand over hand A to perform grooming tasks and Max tactile and verbal cues to engage in ADL.  General ADL Comments: Focused session on increasing pt's occupational participation and engagement. Pt performed self feeding and grooming tasks while seated in recliner with optimized support for sitting position. Pt required Max A and Max cuing to optimize engagment. Pt follow simple direct cues with increased time (inconsistantly).     Mobility  Overal bed mobility: Needs Assistance Bed Mobility: Rolling, Supine to Sit, Sit to Supine Rolling: Max assist, +2 for physical assistance Supine to sit: Total assist, +2 for physical assistance Sit to supine: Total assist General bed mobility comments: Pt in chair upon arrival    Transfers  Overall transfer level: Needs assistance Equipment used: None Transfers: Sit to/from Stand Sit to Stand: Total assist, +2 physical assistance, Max  assist Stand pivot transfers: Max assist, Total assist, +2 physical assistance, +2 safety/equipment, From elevated surface General transfer comment: Using bed pad to list hips off surface. Max A to lean forward and push upward. Unable to reach standing    Ambulation / Gait / Stairs / Wheelchair Mobility  Ambulation/Gait General Gait Details: unable    Posture / Balance Dynamic Sitting Balance Sitting balance - Comments: lists to the left during EOb activity, poor ability to arouse, minimal engagement Balance Overall balance assessment: Needs assistance Sitting-balance support: Feet supported, Bilateral upper extremity supported Sitting balance-Leahy Scale: Poor Sitting balance - Comments: lists to the left during EOb activity, poor ability to arouse, minimal engagement Postural control: Left lateral lean Standing balance support: During functional activity, No upper extremity supported Standing balance-Leahy Scale: Zero Standing balance comment: Total A +2 for attempt at sit<>Stand to provide pressure relief    Special needs/care consideration BiPAP/CPAP  N/a CPM  N/a Continuous  Drip IV  N/a Dialysis  N/a Life Vest  N/a Oxygen  N/a Special Bed  N/a Trach Size  N/a Wound Vac n/a Skin  Head surgical incision with staples Bowel mgmt: incontinent LBM 06/30/17 Bladder mgmt: incontinent Diabetic mgmt n/a Hgb A1c 5.6   Previous Home Environment Living Arrangements: Spouse/significant other  Lives With: Spouse Available Help at Discharge: Available 24 hours/day Type of Home: Apartment Home Layout: One level Home Access: Stairs to enter Entrance Stairs-Rails:  (two entrances with same amount of steps. Rail on one side an) Secretary/administrator of Steps: 16 steps to upstairs apartment Bathroom Shower/Tub: Hydrographic surveyor, Engineer, building services: Pharmacist, community: Yes How Accessible: Accessible via walker Home Care Services: No Additional Comments: Pt's family is  very interested in CIR  Discharge Living Setting Plans for Discharge Living Setting: Patient's home, Lives with (comment), Apartment (wife) Type of Home at Discharge: Apartment Discharge Home Layout: One level Discharge Home Access: Stairs to enter Entrance Stairs-Rails:  (two entrances with same amount of steps. 16 with rails on op) Entrance Stairs-Number of Steps: 16 Discharge Bathroom Shower/Tub: Tub/shower unit, Curtain Discharge Bathroom Toilet: Standard Discharge Bathroom Accessibility: Yes How Accessible: Accessible via walker Does the patient have any problems obtaining your medications?: No  Social/Family/Support Systems Patient Roles: Spouse, Parent Contact Information: [name], wife Anticipated Caregiver: wife and daughter Anticipated Caregiver's Contact Information: see above Caregiver Availability: 24/7 Discharge Plan Discussed with Primary Caregiver: Yes Is Caregiver In Agreement with Plan?: Yes Does Caregiver/Family have Issues with Lodging/Transportation while Pt is in Rehab?: No (Family stay with him 24/7 in hospital; son from [place] i s)  Goals/Additional Needs Patient/Family Goal for Rehab: supervision PT, supervision to min assist OT and SLP Expected length of stay: [facility]OS 18-24 days Pt/Family Agrees to Admission and willing to participate: Yes Program Orientation Provided & Reviewed with Pt/Caregiver Including Roles  & Responsibilities: Yes  Barriers to Discharge: Home environment access/layout, Incontinence  Decrease burden of Care through IP rehab admission: n/a  Possible need for SNF placement upon discharge: yes if pt unable to reach level to be managed at home.  Patient Condition: This patient's medical and functional status has changed since the consult dated: 06/26/2017 in which the Rehabilitation Physician determined and documented that the patient's condition is appropriate for intensive rehabilitative care in an inpatient rehabilitation facility. See  "History of Present Illness" (above) for medical update. Functional changes are: overall max assist. Patient's medical and functional status update has been discussed with the Rehabilitation physician and patient remains appropriate for inpatient rehabilitation. Will admit to inpatient rehab today.  Preadmission Screen Completed By:  Clois Dupes, 06/30/2017 4:05 PM ______________________________________________________________________   Discussed status with Dr. Riley Kill on 06/30/2017 at  1605 and received telephone approval for admission today.  Admission Coordinator:  Clois Dupes, time 1610 Date 06/30/2017

## 2017-06-30 NOTE — Discharge Summary (Signed)
Stroke Discharge Summary  Patient ID: Randy Durham   MRN: [id number]      DOB: 1934/07/11  Date of Admission: 06/23/2017 Date of Discharge: 06/30/2017  Attending Physician:  Marvel Plan, Stroke MD Consultant(s):  Treatment Team:  Coletta Memos, MD pulmonary/intensive care and rehabilitation medicine Patient's PCP:  Renford Dills, MD  Discharge Diagnoses:  Active Problems:   Hemorrhage of right temporal lobe [facility]) s/p surgical evacuation   Left MCA infarct   Intraventricular hemorrhage (HCC)   PAF on eliquis   Cytotoxic brain edema (HCC)   HTN   HLD   CAD    obesity  Past Medical History:  Diagnosis Date  . Anemia   . CAD (coronary artery disease)    Cath 03/26/2015 100% distal LAD stenosis, 80% ostial D1 stenosis, 75% ramus stenosis, 60% RPDA stenosis, 30% proximal RCA stenosis. Medical therapy, outpt myoview to assess LCx lesion  . Chronic kidney disease (CKD), stage III (moderate)   . Hyperlipidemia   . Hypertension   . Hypothyroidism   . Obesity (BMI 30-39.9)   . Spinal stenosis   . Thyroid nodule    Past Surgical History:  Procedure Laterality Date  . CARDIAC CATHETERIZATION N/A 03/26/2015   Procedure: Left Heart Cath and Coronary Angiography;  Surgeon: Runell Gess, MD;  Location: [facility] INVASIVE CV LAB;  Service: Cardiovascular;  Laterality: N/A;  . CRANIOTOMY Right 06/24/2017   Procedure: CRANIOTOMY HEMATOMA EVACUATION SUBDURAL;  Surgeon: Coletta Memos, MD;  Location: [facility] OR;  Service: Neurosurgery;  Laterality: Right;  . PILONIDAL CYST EXCISION    . TONSILLECTOMY      Medications to be continued on Rehab   LABORATORY STUDIES CBC    Component Value Date/Time   WBC 7.8 06/30/2017 0833   RBC 4.35 06/30/2017 0833   HGB 12.4 (L) 06/30/2017 0833   HGB 15.8 03/25/2017 0914   HCT 36.6 (L) 06/30/2017 0833   HCT 46.4 03/25/2017 0914   PLT 267 06/30/2017 0833   PLT 224 03/25/2017 0914   MCV 84.1 06/30/2017 0833   MCV 86 03/25/2017 0914   MCH 28.5  06/30/2017 0833   MCHC 33.9 06/30/2017 0833   RDW 13.0 06/30/2017 0833   RDW 14.2 03/25/2017 0914   LYMPHSABS 1.5 03/25/2017 0914   MONOABS 0.5 05/10/2015 1039   EOSABS 0.1 03/25/2017 0914   BASOSABS 0.0 03/25/2017 0914   CMP    Component Value Date/Time   NA 134 (L) 06/30/2017 0833   NA 140 03/25/2017 0914   K 3.5 06/30/2017 0833   CL 101 06/30/2017 0833   CO2 25 06/30/2017 0833   GLUCOSE 104 (H) 06/30/2017 0833   BUN 12 06/30/2017 0833   BUN 19 03/25/2017 0914   CREATININE 0.78 06/30/2017 0833   CALCIUM 8.4 (L) 06/30/2017 0833   PROT 7.2 06/23/2017 1829   ALBUMIN 4.3 06/23/2017 1829   AST 41 06/23/2017 1829   ALT 33 06/23/2017 1829   ALKPHOS 58 06/23/2017 1829   BILITOT 1.5 (H) 06/23/2017 1829   GFRNONAA >60 06/30/2017 0833   GFRAA >60 06/30/2017 0833   COAGS Lab Results  Component Value Date   INR 1.04 06/24/2017   INR 1.05 03/25/2015   Lipid Panel    Component Value Date/Time   CHOL 131 05/10/2015 1039   TRIG 101 06/24/2017 0930   HDL 45.00 05/10/2015 1039   CHOLHDL 3 05/10/2015 1039   VLDL 16.0 05/10/2015 1039   LDLCALC 70 05/10/2015 1039   HgbA1C  Lab Results  Component Value Date   HGBA1C 5.6 06/24/2017   Urinalysis No results found for: COLORURINE, APPEARANCEUR, LABSPEC, PHURINE, GLUCOSEU, HGBUR, BILIRUBINUR, KETONESUR, PROTEINUR, UROBILINOGEN, NITRITE, LEUKOCYTESUR Urine Drug Screen No results found for: LABOPIA, COCAINSCRNUR, LABBENZ, AMPHETMU, THCU, LABBARB  Alcohol Level No results found for: [facility]   SIGNIFICANT DIAGNOSTIC STUDIES Ct Angio Head W Or Wo Contrast Ct Angio Neck W Or Wo Contrast 06/24/2017   IMPRESSION: 1. Unchanged volume of intraparenchymal hemorrhage in the right temporal lobe. Unchanged intraventricular extension and ventricular configuration. 2. No emergent large vessel occlusion. Poor visualization of the origin of the right posterior cerebral artery, which probably arises from a small right posterior communicating artery. The  remainder of the visible PCA is normal. 3. No vascular abnormality to account for the intraparenchymal hemorrhage. In a patient of this age, lobar parenchymal hemorrhage may be secondary to underlying amyloid angiopathy for hypertension. 4. Carotid bifurcation and Aortic Atherosclerosis (ICD10-I70.0) without hemodynamically significant stenosis.  Ct Head Wo Contrast 06/24/2017 IMPRESSION: Large right temporal intraparenchymal hemorrhage with intraventricular extension, mild surrounding edema. Hemorrhage fills the right lateral ventricle and tracks into the third, fourth, and left lateral ventricles. The basilar cisterns remain patent, there is no hydrocephalus.    06/25/2017 IMPRESSION: 1. Status post RIGHT craniotomy for temporoparietal hematoma evacuation. Local edema and small amount of residual intraparenchymal hemorrhage. No residual midline shift.  2. Small RIGHT cerebellar tentorium subdural hematoma and trace subarachnoid hemorrhage, likely re- distributed.  3. Intraventricular blood products without hydrocephalus.  [name] Wo Contrast 06/24/2017 IMPRESSION: 1. Postsurgical changes from interval right temporal hematoma evacuation. 2. Small volume acute ischemia just posterior to the surgical cavity. 3. Decreased intraventricular blood products. 4. Small right and tiny left subdural fluid collections without significant mass effect. 5. Scattered bilateral sulcal FLAIR signal abnormality which may reflect a combination of small volume subarachnoid blood products and artifact.  CT Head 06/27/17 :  1. Evolving infarct along the posterior margin of the intraparenchymal hematoma site. Increased cytotoxic edema there since 06/24/2017 but no significant mass effect. 2. Stable to mildly regressed intracranial hemorrhage; intraparenchymal, intraventricular, and extra-axial. 3. Postoperative changes including continued pneumocephalus.  EEG - This EEG is abnormal due to moderatediffuse  slowing of the background. Clinical Correlation of the above findings indicates diffuse cerebral dysfunction that is non-specific in etiology and can be seen with hypoxic/ischemic injury, toxic/metabolic encephalopathies, or medication effect. The absence of epileptiform discharges does not rule out a clinical diagnosis of epilepsy. Clinical correlation is advised.  TTE 04/06/17 -Left ventricle: The cavity size was normal. Wall thickness was increased in a pattern of mild LVH. There was focal basal hypertrophy. Systolic function was normal. The estimated ejection fraction was in the range of 50% to 55%. Wall motion was normal; there were no regional wall motion abnormalities. Features are consistent with a pseudonormal left ventricular filling pattern, with concomitant abnormal relaxation and increased filling pressure (grade 2 diastolic dysfunction). - Aortic valve: Mildly to moderately calcified annulus. - Left atrium: The atrium was mildly dilated.    HISTORY OF PRESENT ILLNESS Randy Durham is an 83 y.o. male who presented to the ED with somnolence and headache since Tuesday morning. He woke up with the headache at 3 AM and vomited once. Also with nausea, diarrhea and chills.  At 7:15 AM he woke up his wife and told her that he had headache pain behind his right eye. He appeared lethargic and confused. Later, he began exhibiting worsened confusion, repeating questions and being unable to log  on to a computer. He also seemed to have difficulty with vision on his left. He appeared to be very sleepy per his wife. Of note, for the past few weeks he has been falling asleep at the table. He is on oral anticoagulation with Eliquis for MI prevention per family.   On arrival to the ED, he could not follow commands appropriately, stated that the year was 76 and that he was in [place] or [place].   CT in the ED revealed a large right temporal intraparenchymal hemorrhage with  intraventricular extension and mild surrounding edema. Hemorrhage fills the right lateral ventricle and tracks into the third, fourth, and left lateral ventricles. The basilar cisterns remain patent, there is no hydrocephalus.  His PMHx includes CAD, CKD, HLD, HTN, hypothyroidism and spinal stenosis. He has no prior history of stroke.   ICH score: 4   HOSPITAL COURSE Randy Durham is a 83 y.o. male with history of CAD, CKD, HLD, HTN, hypothyroidism and spinal stenosis who presented to the ED with somnolence and headache.    ICH vs. hemorrhagic conversion: Large R temporal IPH with IVH s/p craniotomy for hematoma evacuation. Subsequent MRI showed left temporal infarcts, concerning for temporal ICH actually due to hemorrhagic transformation. However patient was on eliquis for PAF treatment PTA.  Resultant  left hemiparesis and left hemianopsia  CT head: Large right temporal ICH with IVH, mild surrounding edema.   MRI head (POD#0). Small volume acute ischemia posterior to surgical cavity  CTA head/neck: No vascular abnormality to account for the ICH  2D Echo: EF 50-55% on 04/06/17  Repeat CT right temporal infarct, right temporal ICH s/p evacuation  LDL 70  HgbA1c 5.6  Heparin subcutaneous for VTE prophylaxis  Diet full liquid Room service appropriate? Yes; Fluid consistency: Thin  aspirin 81 mg daily and Eliquis (apixaban) daily prior to admission, now on aspirin 81. Will repeat CT head in 3-4 weeks if ICH resolved, will consider to resume anticoagulation.  Patient counseled to be compliant with his antithrombotic medications  Ongoing aggressive stroke risk factor management  Therapy recommendations:   CIR  Disposition:   CIR today  PAF on eliquis  03/2015 30 day cardiac event monitoring showed PAF  Patient on eilquis at home  Continue aspirin 81  Will repeat CT head in 3-4 weeks if ICH resolved, will consider to resume  anticoagulation  Hypertension  Stable  Off clevidipine  on lisinopril and metoprolol  Long-term BP goal normotensive  Hyperlipidemia  Home meds: atorvastatin 80 mg PO daily  LDL 70, goal < 70  Resume Lipitor  Continue statin at discharge  Headache  Loaded with 1 g Depakote  Continue Depakote 500 twice a day  Other Stroke Risk Factors  Advanced age  ETOH use, advised to drink no more than 2 drink(s) a day  CAD on aspirin  Obesity, Body mass index is 31.83 kg/m., recommend weight loss, diet and exercise as appropriate   Other Active Problems  Spinal stenosis  Drowsy sleepy - add amantadine during the day, correct sleep and wake cycle disturbance  DISCHARGE EXAM Blood pressure (!) 132/95, pulse 89, temperature (!) 97.3 F (36.3 C), temperature source Oral, resp. rate 18, height 5\' 3"  (1.6 m), weight 180 lb (81.6 kg), SpO2 97 %.  Elderly obese Caucasian male who is currently is  Not in distress . Afebrile. Head is nontraumatic. Neck is supple without bruit.    Cardiac exam no murmur or gallop. Lungs are clear to auscultation. Distal pulses  are well felt. Neurological Exam: sleepy but  eyes open partially to sternal rub.right gaze preference and partial left gaze palsy. Left hemianopsia. Mild right left confusion.  .  Able to move all 4 extremity   against gravity but left side less than the right. Pupils small irregular but reactive. Fundi could not be visualized. Mild left lower facial weakness. Tongue midline. Tone appears to be normal bilaterally. Both plantars are downgoing.   DISCHARGE PLAN  Disposition:  Transfer to [facility] for ongoing PT, OT and ST  aspirin 325 mg daily for secondary stroke prevention.  Recommend ongoing risk factor control by Primary Care Physician at time of discharge from inpatient rehabilitation.  Follow-up Renford Dills, MD in 2 weeks following discharge from rehab.  Follow-up with Dr. Delia Heady,  Stroke Clinic in 6 weeks, office to schedule an appointment.   35 minutes were spent preparing discharge.  Marvel Plan, MD PhD Stroke Neurology 06/30/2017 9:16 PM

## 2017-07-01 ENCOUNTER — Inpatient Hospital Stay (HOSPITAL_COMMUNITY): Payer: Medicare Other | Admitting: Occupational Therapy

## 2017-07-01 ENCOUNTER — Encounter (HOSPITAL_COMMUNITY): Payer: Self-pay

## 2017-07-01 ENCOUNTER — Inpatient Hospital Stay (HOSPITAL_COMMUNITY): Payer: Medicare Other | Admitting: Physical Therapy

## 2017-07-01 ENCOUNTER — Inpatient Hospital Stay (HOSPITAL_COMMUNITY): Payer: Medicare Other

## 2017-07-01 LAB — CBC WITH DIFFERENTIAL/PLATELET
BASOS ABS: 0 10*3/uL (ref 0.0–0.1)
BASOS PCT: 0 %
EOS ABS: 0.2 10*3/uL (ref 0.0–0.7)
EOS PCT: 2 %
HCT: 40 % (ref 39.0–52.0)
Hemoglobin: 13.5 g/dL (ref 13.0–17.0)
LYMPHS PCT: 16 %
Lymphs Abs: 1.2 10*3/uL (ref 0.7–4.0)
MCH: 28.4 pg (ref 26.0–34.0)
MCHC: 33.8 g/dL (ref 30.0–36.0)
MCV: 84.2 fL (ref 78.0–100.0)
MONO ABS: 0.8 10*3/uL (ref 0.1–1.0)
Monocytes Relative: 10 %
Neutro Abs: 5.6 10*3/uL (ref 1.7–7.7)
Neutrophils Relative %: 72 %
PLATELETS: 291 10*3/uL (ref 150–400)
RBC: 4.75 MIL/uL (ref 4.22–5.81)
RDW: 13.2 % (ref 11.5–15.5)
WBC: 7.8 10*3/uL (ref 4.0–10.5)

## 2017-07-01 LAB — URINALYSIS, COMPLETE (UACMP) WITH MICROSCOPIC
Bacteria, UA: NONE SEEN
Bilirubin Urine: NEGATIVE
Glucose, UA: NEGATIVE mg/dL
Ketones, ur: 5 mg/dL — AB
Leukocytes, UA: NEGATIVE
Nitrite: NEGATIVE
PROTEIN: NEGATIVE mg/dL
SPECIFIC GRAVITY, URINE: 1.016 (ref 1.005–1.030)
Squamous Epithelial / LPF: NONE SEEN
WBC, UA: NONE SEEN WBC/hpf (ref 0–5)
pH: 6 (ref 5.0–8.0)

## 2017-07-01 LAB — COMPREHENSIVE METABOLIC PANEL
ALT: 27 U/L (ref 17–63)
AST: 27 U/L (ref 15–41)
Albumin: 2.7 g/dL — ABNORMAL LOW (ref 3.5–5.0)
Alkaline Phosphatase: 51 U/L (ref 38–126)
Anion gap: 10 (ref 5–15)
BUN: 10 mg/dL (ref 6–20)
CO2: 24 mmol/L (ref 22–32)
Calcium: 8.5 mg/dL — ABNORMAL LOW (ref 8.9–10.3)
Chloride: 100 mmol/L — ABNORMAL LOW (ref 101–111)
Creatinine, Ser: 0.9 mg/dL (ref 0.61–1.24)
GFR calc Af Amer: >60 mL/min (ref >60)
GFR calc non Af Amer: >60 mL/min (ref >60)
Glucose, Bld: 90 mg/dL (ref 65–99)
Potassium: 3.7 mmol/L (ref 3.5–5.1)
Sodium: 134 mmol/L — ABNORMAL LOW (ref 135–145)
Total Bilirubin: 1.1 mg/dL (ref 0.3–1.2)
Total Protein: 5.6 g/dL — ABNORMAL LOW (ref 6.5–8.1)

## 2017-07-01 LAB — VALPROIC ACID LEVEL: VALPROIC ACID LVL: 55 ug/mL (ref 50.0–100.0)

## 2017-07-01 NOTE — Progress Notes (Signed)
Patient information reviewed and entered into eRehab system by Asaph Serena, RN, CRRN, PPS Coordinator.  Information including medical coding and functional independence measure will be reviewed and updated through discharge.     Per nursing patient and family given "Data Collection Information Summary for Patients in Inpatient Rehabilitation Facilities with attached "Privacy Act Statement-Health Care Records" upon admission.  

## 2017-07-01 NOTE — Evaluation (Addendum)
Speech Language Pathology Assessment and Plan  Patient Details  Name: Jiro Kiester MRN: [id number] Date of Birth: 1934-08-28  SLP Diagnosis: Cognitive Impairments  Rehab Potential: Fair ELOS: 20-21 days    Today's Date: 07/01/2017 SLP Individual Time: 1330-1415 SLP Individual Time Calculation (min): 45 min   Problem List:  Patient Active Problem List   Diagnosis Date Noted  . Somnolence   . Intracerebral hemorrhage 06/24/2017  . Hemorrhage of right temporal lobe (HCC) 06/24/2017  . Intraventricular hemorrhage (HCC) 06/24/2017  . Cytotoxic brain edema (HCC) 06/24/2017  . Paroxysmal atrial fibrillation (HCC) 03/30/2015  . Bradycardia 03/27/2015  . NSVT (nonsustained ventricular tachycardia) (HCC) 03/27/2015  . CAD- occluded distal LAD, 75-80% CFX- medical Rx 03/27/2015  . NSTEMI 03/25/15 03/25/2015  . Chronic kidney disease (CKD), stage III (moderate)   . Obesity (BMI 30-39.9)   . Spinal stenosis   . Hypertension   . Hyperlipidemia    Past Medical History:  Past Medical History:  Diagnosis Date  . Anemia   . CAD (coronary artery disease)    Cath 03/26/2015 100% distal LAD stenosis, 80% ostial D1 stenosis, 75% ramus stenosis, 60% RPDA stenosis, 30% proximal RCA stenosis. Medical therapy, outpt myoview to assess LCx lesion  . Chronic kidney disease (CKD), stage III (moderate)   . Hyperlipidemia   . Hypertension   . Hypothyroidism   . Obesity (BMI 30-39.9)   . Spinal stenosis   . Thyroid nodule    Past Surgical History:  Past Surgical History:  Procedure Laterality Date  . CARDIAC CATHETERIZATION N/A 03/26/2015   Procedure: Left Heart Cath and Coronary Angiography;  Surgeon: Runell Gess, MD;  Location: [facility] INVASIVE CV LAB;  Service: Cardiovascular;  Laterality: N/A;  . CRANIOTOMY Right 06/24/2017   Procedure: CRANIOTOMY HEMATOMA EVACUATION SUBDURAL;  Surgeon: Coletta Memos, MD;  Location: [facility] OR;  Service: Neurosurgery;  Laterality: Right;  . PILONIDAL CYST EXCISION     . TONSILLECTOMY      Assessment / Plan / Recommendation Clinical Impression Seanmichael Molinariis a 83 y.o.right handed malewith history of Spinal stenosis, CAD/NSTEMI/PAF maintained on Eliquis, hypertension, CKD stage III.Per chart review patient lives with wife in [place]. Independent and driving prior to admission. One level apartment with 10 steps to entry. Wife can assist but limited physically he also has a daughter in the area that works.Presented 06/24/2017 with somnolence and increasing headache as well as bouts of nauseavomitingand chills. CT of the head showed a large right temporal intraparenchymal hemorrhage with intraventricular extension and mild surrounding edema. Hemorrhage fills the right lateral ventricle and tracks into the third, fourth and left lateral ventricles. Underwent craniotomy right temporal forevacuation of hematoma 06/24/2017 per Dr. Mikal Plane.Maintained on Valproatefor seizure prophylaxis. Latest follow-up CT scan 06/28/2017 showing no new mass effect or midline shift. EEG completed due to some somnolence 06/29/2017 negative for seizure with somnolence felt to be possibly due to narcotics. Patient had been placed on amantadine twice a day. His Eliquiswas discontinued secondary to ICH but low dose aspirin had been initiated 06/26/2017. Placed on subcutaneous heparin for DVT prophylaxis 06/29/2017.  Pt was evaluated on 07/01/17 to assess cognitive - linguistic and swallowing abilities, however pt's severe reduction in attention and ability to maintain arousal greatly impacted ability to assess skill set. Pt demonstrated severe lethargy, inability to maintain arousal greater than 2 seconds given maximum multimodal cues from SLP and family members. Pt demonstrated severe impairment in ability express wants and needs and respond to basic yes/no questions. Pt demonstrated ability to express  three words, his name and greeting his daughter, however was unable to open eyes  during expression. Pt was unable to participate in BSE due to severe lethargy and risk of aspiration. Pt is currently on a liquid only diet, due to severity of fatigue, however prior to CIR was on a regular/ thin diet with not noted swallowing impairments. SLP recommends a BSE be completed when pt is able to maintain arousal. [place] to being hospitalized family stated that pt was independent in swallowing, driving, active in the church, independent in all ADLs, medication management and directional driving skills, however wife noted mild impairments in medication management and directional skills in the past two months. Pt will benefit from skilled ST services to maximum functional independence and reduce burden of care for discharge home with spouse.    Skilled Therapeutic Interventions          Skilled ST services focused on multimodal cues to gain arousal from pt with assistance from family members. SLP provided educated to family pertaining to swallow precautions to reduce risk of aspiration during periods of reduced arousal, family expressed understanding. SLP provided education on methods to stimulate arousal and stimulate cognition.    SLP Assessment  Patient will need skilled Speech Lanaguage Pathology Services during CIR admission    Recommendations  SLP Diet Recommendations:  (unable to assess due to reduced arosual, however previous diet reg/thin) Medication Administration:  (unable to assess due to reudced arosual, however previous diet reg/thin) Supervision: Full supervision/cueing for compensatory strategies Compensations:  (unable to assess due to reduced arosual, however previous reccomendation small sips/bites) Postural Changes and/or Swallow Maneuvers: Seated upright 90 degrees Oral Care Recommendations: Oral care BID Patient destination: Home Follow up Recommendations: Home Health SLP;Outpatient SLP Equipment Recommended: None recommended by SLP    SLP Frequency 3 to 5 out of 7  days   SLP Duration  SLP Intensity  SLP Treatment/Interventions 20-21 days  Minumum of 1-2 x/day, 30 to 90 minutes  Cognitive remediation/compensation;Cueing hierarchy;Environmental controls;Functional tasks;Medication managment    Pain Pain Assessment Pain Assessment:  (Pt unable to express pain, however facial grimace noted)  Prior Functioning Cognitive/Linguistic Baseline: Within functional limits (however wife noted min change in memory two months prior to CVA) Type of Home: Apartment  Lives With: Spouse Available Help at Discharge: Available 24 hours/day Vocation: Retired  Function:  Eating Eating Eating activity did not occur: Safety/medical concerns   Eating Assist Level: Helper feeds patient (per family)           Cognition Comprehension Comprehension assist level: Understands basic less than 25% of the time/ requires cueing >75% of the time  Expression   Expression assist level: Expresses basis less than 25% of the time/requires cueing >75% of the time.  Social Interaction Social Interaction assist level: Interacts appropriately less than 25% of the time. May be withdrawn or combative.  Problem Solving Problem solving assist level: Solves basic less than 25% of the time - needs direction nearly all the time or does not effectively solve problems and may need a restraint for safety  Memory Memory assist level: Recognizes or recalls less than 25% of the time/requires cueing greater than 75% of the time   Short Term Goals: Week 1: SLP Short Term Goal 1 (Week 1): Pt will demonstrate focused attention to functional task for 30 seconds with Max multimodal cues. SLP Short Term Goal 2 (Week 1): Pt will consistently demonstrate O x 4 with Max multimodal cues. SLP Short Term Goal 3 (Week  1): Pt will answer yes/no questions with 30% accuracy using max multimodal cues. SLP Short Term Goal 4 (Week 1): Pt will communicate basic wants and needs via multimodal communciation with  Max A verbal cues. SLP Short Term Goal 5 (Week 1): Pt will initate basic familar ADL tasks in 30% of opportunties with Max multimodal cues.  Refer to Care Plan for Long Term Goals  Recommendations for other services: None   Discharge Criteria: Patient will be discharged from SLP if patient refuses treatment 3 consecutive times without medical reason, if treatment goals not met, if there is a change in medical status, if patient makes no progress towards goals or if patient is discharged from hospital.  The above assessment, treatment plan, treatment alternatives and goals were discussed and mutually agreed upon: by family  Celsa Nordahl  [facility] 07/01/2017, 3:20 PM

## 2017-07-01 NOTE — Progress Notes (Signed)
Physical Therapy Assessment and Plan  Patient Details  Name: Randy Durham MRN: [id number] Date of Birth: Mar 17, 1934  PT Diagnosis: Abnormal posture, Abnormality of gait, Cognitive deficits, Coordination disorder, Difficulty walking, Impaired cognition and Muscle weakness Rehab Potential: Good ELOS: 21-24 days   Today's Date: 07/01/2017 PT Individual Time: 0800-0910 PT Individual Time Calculation (min): 70 min    Problem List:  Patient Active Problem List   Diagnosis Date Noted  . Somnolence   . Intracerebral hemorrhage 06/24/2017  . Hemorrhage of right temporal lobe ([place]) 06/24/2017  . Intraventricular hemorrhage ([place]) 06/24/2017  . Cytotoxic brain edema ([place]) 06/24/2017  . Paroxysmal atrial fibrillation ([place]) 03/30/2015  . Bradycardia 03/27/2015  . NSVT (nonsustained ventricular tachycardia) ([place]) 03/27/2015  . CAD- occluded distal LAD, 75-80% CFX- medical Rx 03/27/2015  . NSTEMI 03/25/15 03/25/2015  . Chronic kidney disease (CKD), stage III (moderate)   . Obesity (BMI 30-39.9)   . Spinal stenosis   . Hypertension   . Hyperlipidemia     Past Medical History:  Past Medical History:  Diagnosis Date  . Anemia   . CAD (coronary artery disease)    Cath 03/26/2015 100% distal LAD stenosis, 80% ostial D1 stenosis, 75% ramus stenosis, 60% RPDA stenosis, 30% proximal RCA stenosis. Medical therapy, outpt myoview to assess LCx lesion  . Chronic kidney disease (CKD), stage III (moderate)   . Hyperlipidemia   . Hypertension   . Hypothyroidism   . Obesity (BMI 30-39.9)   . Spinal stenosis   . Thyroid nodule    Past Surgical History:  Past Surgical History:  Procedure Laterality Date  . CARDIAC CATHETERIZATION N/A 03/26/2015   Procedure: Left Heart Cath and Coronary Angiography;  Surgeon: Lorretta Harp, MD;  Location: [facility];  Service: Cardiovascular;  Laterality: N/A;  . CRANIOTOMY Right 06/24/2017   Procedure: CRANIOTOMY HEMATOMA EVACUATION SUBDURAL;  Surgeon:  Ashok Pall, MD;  Location: [facility];  Service: Neurosurgery;  Laterality: Right;  . PILONIDAL CYST EXCISION    . TONSILLECTOMY      Assessment & Plan Clinical Impression: Randy Molinariis a 83 y.o.right handed malewith history of Spinal stenosis, CAD/NSTEMI/PAF maintained on Eliquis, hypertension, CKD stage III.Per chart review patient lives with wife in [place]. Independent and driving prior to admission. One level apartment with 10 steps to entry. Wife can assist but limited physically he also has a daughter in the area that works.Presented 06/24/2017 with somnolence and increasing headache as well as bouts of nauseavomitingand chills. CT of the head showed a large right temporal intraparenchymal hemorrhage with intraventricular extension and mild surrounding edema. Hemorrhage fills the right lateral ventricle and tracks into the third, fourth and left lateral ventricles. Underwent craniotomy right temporal forevacuation of hematoma 06/24/2017 per Dr. Cyndy Freeze.Maintained on Valproatefor seizure prophylaxis. Latest follow-up CT scan 06/28/2017 showing no new mass effect or midline shift. EEG completed due to some somnolence 06/29/2017 negative for seizure with somnolence felt to be possibly due to narcotics. Patient had been placed on amantadine twice a day. His Eliquiswas discontinued secondary to [place] but low dose aspirin had been initiated 06/26/2017. Placed on subcutaneous heparin for DVT prophylaxis 06/29/2017.   Patient transferred to CIR on 06/30/2017.    Patient currently requires total/ max with mobility secondary to muscle weakness, decreased cardiorespiratoy endurance, decreased coordination and decreased motor planning, decreased awareness and decreased safety awareness and decreased sitting balance, decreased standing balance, decreased postural control and decreased balance strategies.  Prior to hospitalization, patient was independent  with mobility and lived  with Spouse in a  Apartment home.  Home access is 10 step entry to upstairs apartmentStairs to enter.  Patient will benefit from skilled PT intervention to maximize safe functional mobility, minimize fall risk and decrease caregiver burden for planned discharge home with 24 hour assist.  Anticipate patient will benefit from follow up [facility] at discharge.  PT - End of Session Activity Tolerance: Tolerates < 10 min activity, no significant change in vital signs (pt unable to tolerate activity due to lethargic state) Endurance Deficit: Yes PT Assessment Rehab Potential (ACUTE/IP ONLY): Good PT Barriers to Discharge: Home environment access/layout PT Patient demonstrates impairments in the following area(s): Balance;Endurance;Motor;Safety;Pain PT Transfers Functional Problem(s): Bed Mobility;Bed to Chair;Car;Furniture PT Locomotion Functional Problem(s): Ambulation;Stairs PT Plan PT Intensity: Minimum of 1-2 x/day ,45 to 90 minutes PT Frequency: 5 out of 7 days PT Duration Estimated Length of Stay: 21-24 days PT Treatment/Interventions: Ambulation/gait training;Community reintegration;DME/adaptive equipment instruction;Neuromuscular re-education;Stair training;UE/LE Strength taining/ROM;Discharge planning;Balance/vestibular training;Therapeutic Activities;UE/LE Coordination activities;Cognitive remediation/compensation;Functional mobility training;Patient/family education;Therapeutic Exercise PT Transfers Anticipated Outcome(s): min assist  PT Locomotion Anticipated Outcome(s): min assist  PT Recommendation Recommendations for Other Services: Speech consult;Neuropsych consult;Therapeutic Recreation consult Follow Up Recommendations: Home health PT;24 hour supervision/assistance Patient destination: Home Equipment Recommended: To be determined  Skilled Therapeutic Intervention Pt received lying in bed, asleep. Family present. Pt difficult to arouse and lethargic throughout session. Eyes closed for most of session,.  Pt unable to report pain but displayed facial grimacing and hands over head, indicating possible headache. Pt oriented to person but unable to answer any other questions due to lethargic state. Pt required total assist + 2 for rolling and supine<>sit. Pt minimally responsive to verbal or tactile cues. Pt required max assist +2 for static sitting balance, displayed left lateral lean. Pt transferred to chair with maximove. BP elevated, as reported below, RN notified. Family educated on pressure relief. Pt left sitting up in chair, quick release belt on, all needs in reach, family present.   PT Evaluation Precautions/Restrictions Precautions Precautions: Fall Restrictions Weight Bearing Restrictions: No General Chart Reviewed: Yes Additional Pertinent History: Pt post right temporal intraparenchymal hemorrhage  PT Amount of Missed Time (min): 20 Minutes PT Missed Treatment Reason: Other (Comment) (significant lethargy) Family/Caregiver Present: Yes  Vital SignsTherapy Vitals Pulse Rate: 92 BP: (!) 151/102 Oxygen Therapy SpO2: 99 % O2 Device: Not Delivered Pain Pain Assessment Pain Assessment:  (Pt unable to express pain but signs facial grimaces & hands to head indicated possible head pain) Home Living/Prior Functioning Home Living Available Help at Discharge: Available 24 hours/day Type of Home: Apartment Home Access: Stairs to enter [employer] of Steps: 10 step entry to upstairs apartment Home Layout: One level  Lives With: Spouse Vision/Perception  Vision - Assessment Additional Comments: Pt with eyes closed most of session; open briefly throughout  Cognition Arousal/Alertness: Lethargic (Pt unable to respond to most questions) Orientation Level: Oriented to person Sensation Sensation Light Touch: Not tested Proprioception: Not tested Additional Comments: Pt lethargic and unable to respond to sensation or coordination testing  Motor  Motor Motor: Other  (comment) (generalized weakness)  Mobility Bed Mobility Bed Mobility: Rolling Right;Rolling Left;Sit to Supine;Supine to Sit Rolling Right: 1: +2 Total assist Rolling Right: Patient Percentage: 0% Rolling Right Details: Manual facilitation for weight shifting;Tactile cues for sequencing;Verbal cues for sequencing Rolling Left: 1: +2 Total assist Rolling Left: Patient Percentage: 0% Rolling Left Details: Manual facilitation for weight shifting;Tactile cues for sequencing;Verbal cues for sequencing Supine to Sit: 1: +2 Total assist Supine  to Sit: Patient Percentage: 0% Sit to Supine: 1: +2 Total assist Sit to Supine: Patient Percentage: 0% Sit to Supine - Details: Manual facilitation for weight bearing;Verbal cues for sequencing Transfers Transfers: Yes Transfer via Lift Equipment: Maximove Locomotion  Ambulation Ambulation:  (Not assessed due to pt lethargic state; will assess when appropriate ) Wheelchair Mobility Wheelchair Mobility:  (Not assessed due to pt lethargic state; will assess when appropriate )  Trunk/Postural Assessment  Cervical Assessment Cervical Assessment: Exceptions to [facility] (forward head) Thoracic Assessment Thoracic Assessment: Exceptions to [facility] (kyphotic ) Lumbar Assessment Lumbar Assessment: Exceptions to [facility] (posterior pelvic tilt) Postural Control Postural Control: Deficits on evaluation (unable to maintain sitting balance; required max assist +2)  Balance Balance Balance Assessed: Yes Static Sitting Balance Static Sitting - Comment/# of Minutes: Pt required max assist for sitting balance +2. Displays L lateral lean  Dynamic Sitting Balance Sitting balance - Comments: Unable to follow commands for reaching  Extremity Assessment  RUE Assessment RUE Assessment: Not tested (due to lethargic state; pt able to lightly squeeze fingers) LUE Assessment LUE Assessment: Not tested (due to lethargic state; pt able to lightly squeeze fingers ) RLE  Assessment RLE Assessment: Not tested (due to lethargic state) LLE Assessment LLE Assessment: Not tested (due to lethargic state)   See Function Navigator for Current Functional Status.   Refer to Care Plan for Long Term Goals  Recommendations for other services: Neuropsych and Therapeutic Recreation  Pet therapy and Stress management  Discharge Criteria: Patient will be discharged from PT if patient refuses treatment 3 consecutive times without medical reason, if treatment goals not met, if there is a change in medical status, if patient makes no progress towards goals or if patient is discharged from hospital.  The above assessment, treatment plan, treatment alternatives and goals were discussed and mutually agreed upon: by patient and by family  Gwinda Passe, SPT 07/01/2017, 12:08 PM

## 2017-07-01 NOTE — Progress Notes (Signed)
PHYSICAL MEDICINE & REHABILITATION     PROGRESS NOTE    Subjective/Complaints: Slept quite well, 8+ hours. Difficult to arouse this morning  ROS: Limited due to cognitive/behavioral   Objective: Vital Signs: Blood pressure 137/87, pulse 96, temperature 98.6 F (37 C), temperature source Oral, resp. rate 20, height 5\' 3"  (1.6 m), weight 81.7 kg (180 lb 1.9 oz), SpO2 97 %. No results found.  Recent Labs  06/30/17 0833 07/01/17 0534  WBC 7.8 7.8  HGB 12.4* 13.5  HCT 36.6* 40.0  PLT 267 291    Recent Labs  06/30/17 0833 07/01/17 0534  NA 134* 134*  K 3.5 3.7  CL 101 100*  GLUCOSE 104* 90  BUN 12 10  CREATININE 0.78 0.90  CALCIUM 8.4* 8.5*   CBG (last 3)  No results for input(s): GLUCAP in the last 72 hours.  Wt Readings from Last 3 Encounters:  07/01/17 81.7 kg (180 lb 1.9 oz)  06/30/17 81.6 kg (180 lb)  03/25/17 82.1 kg (181 lb)    Physical Exam:  Constitutional: No distress.  HENT:  Head: Normocephalic.  Mouth/Throat: Oropharynx is clear and moist.  Eyes:  Pupils are pinpoint Neck: Normal range of motion. Neck supple. No JVDpresent. No tracheal deviationpresent. No thyromegalypresent.  Cardiovascular: RRR without murmur. No JVD  Respiratory: CTA Bilaterally without wheezes or rales. Normal effort .  GI: Soft. Bowel sounds are normal. He exhibits no distension. There is no tenderness. There is no rebound.  Skin: Skin is warm.  Skin warm and dry. Craniotomy site clean and dry Neurological.pt responds to tactile and some verbal stim. Slow to arouse as a whole. . Moves all 4's spontaneously.  . Withdraws to pain in all 4's.  Psych:  somnolent  Assessment/Plan: 1. Functional and cognitive deficits secondary to right temporal ICH which require 3+ hours per day of interdisciplinary therapy in a comprehensive inpatient rehab setting. Physiatrist is providing close team supervision and 24 hour management of active medical problems listed  below. Physiatrist and rehab team continue to assess barriers to discharge/monitor patient progress toward functional and medical goals.  Function:  Bathing Bathing position      Bathing parts      Bathing assist        Upper Body Dressing/Undressing Upper body dressing                    Upper body assist        Lower Body Dressing/Undressing Lower body dressing                                  Lower body assist        Toileting Toileting          Toileting assist     Transfers Chair/bed transfer             Locomotion Ambulation           Wheelchair          Cognition Comprehension    Expression    Social Interaction    Problem Solving    Memory     Medical Problem List and Plan: 1. Decreased functional mobilitysecondary to right temporal ICH with IVH status post craniotomy for hematoma evacuation 06/24/2017 -begin therapies today  -arousal remains an issue  -spoke with family regarding his care/mgt. They are supportive 2. DVT Prophylaxis/Anticoagulation: Subcutaneous heparin for DVT prophylaxis initiated 06/29/2017  3. Pain Management/back pain: Discontinue hydrocodone due to somnolence. Monitor mental status 4. Mood:Amantadine 100 mg twice a day 0700 and 1200 5. Neuropsych: This patient is notcapable of making decisions on hisown behalf. -sleep chart -limit neuro-sedating medications, suspect some of lethargy is from medication provided at night for confusion/restlessness -schedule HS seroquel effective. Can we use only 25mg  to achieve same sleep effect? -continue day time amantadine 6. Skin/Wound Care: Routine skin checks 7. Fluids/Electrolytes/Nutrition: encourage PO as possible  -I personally reviewed the patient's labs today.   -add vpa level to labs  8.Seizure prophylaxis. Valproate 500 mg every 12 hours. EEG negative 9.CAD with cardiac  catheterization 03/26/2015. Continue low-dose aspirin. 10.CKD stage III. Follow-up chemistries on admission 11.Hypertension/PAF. Lisinopril 5 mg daily, Lopressor 12.5 mg twice a day. Cardiac rate controlled at present 12.Hyperlipidemia. Lipitor 13.Hypothyroidism. Synthroid. Wouldn't check level at this time due acute neurological issues   LOS (Days) 1 A FACE TO FACE EVALUATION WAS PERFORMED  [name], MD 07/01/2017 9:12 AM

## 2017-07-01 NOTE — Evaluation (Signed)
Occupational Therapy Assessment and Plan  Patient Details  Name: Randy Durham MRN: [id number] Date of Birth: Jun 21, 1934  OT Diagnosis: abnormal posture, acute pain, cognitive deficits, disturbance of vision and muscle weakness (generalized) Rehab Potential: Rehab Potential (ACUTE ONLY): Good ELOS: 20-21 days   Today's Date: 07/01/2017 OT Individual Time: 1030-1100 OT Individual Time Calculation (min): 30 min  and Today's Date: 07/01/2017 OT Missed Time: 30 Minutes Missed Time Reason: Patient fatigue;Other (comment) (lethargy)    Problem List:  Patient Active Problem List   Diagnosis Date Noted  . Somnolence   . Intracerebral hemorrhage 06/24/2017  . Hemorrhage of right temporal lobe ([place]) 06/24/2017  . Intraventricular hemorrhage ([place]) 06/24/2017  . Cytotoxic brain edema ([place]) 06/24/2017  . Paroxysmal atrial fibrillation ([place]) 03/30/2015  . Bradycardia 03/27/2015  . NSVT (nonsustained ventricular tachycardia) ([place]) 03/27/2015  . CAD- occluded distal LAD, 75-80% CFX- medical Rx 03/27/2015  . NSTEMI 03/25/15 03/25/2015  . Chronic kidney disease (CKD), stage III (moderate)   . Obesity (BMI 30-39.9)   . Spinal stenosis   . Hypertension   . Hyperlipidemia     Past Medical History:  Past Medical History:  Diagnosis Date  . Anemia   . CAD (coronary artery disease)    Cath 03/26/2015 100% distal LAD stenosis, 80% ostial D1 stenosis, 75% ramus stenosis, 60% RPDA stenosis, 30% proximal RCA stenosis. Medical therapy, outpt myoview to assess LCx lesion  . Chronic kidney disease (CKD), stage III (moderate)   . Hyperlipidemia   . Hypertension   . Hypothyroidism   . Obesity (BMI 30-39.9)   . Spinal stenosis   . Thyroid nodule    Past Surgical History:  Past Surgical History:  Procedure Laterality Date  . CARDIAC CATHETERIZATION N/A 03/26/2015   Procedure: Left Heart Cath and Coronary Angiography;  Surgeon: Lorretta Harp, MD;  Location: [facility];  Service:  Cardiovascular;  Laterality: N/A;  . CRANIOTOMY Right 06/24/2017   Procedure: CRANIOTOMY HEMATOMA EVACUATION SUBDURAL;  Surgeon: Ashok Pall, MD;  Location: [facility];  Service: Neurosurgery;  Laterality: Right;  . PILONIDAL CYST EXCISION    . TONSILLECTOMY      Assessment & Plan Clinical Impression: Randy Durham is a 83 y.o. right handed male with history of Spinal stenosis, CAD/NSTEMI/PAF  maintained on Eliquis, hypertension, CKD stage III. Per chart review patient lives with wife in [place]. Independent and driving prior to admission. One level apartment with 10 steps to entry. Wife can assist but limited physically he also has a daughter in the area that works. Presented 06/24/2017 with somnolence and increasing headache as well as bouts of nausea vomiting and chills. CT of the head showed a large right temporal intraparenchymal hemorrhage with intraventricular extension and mild surrounding edema. Hemorrhage fills the right lateral ventricle and tracks into the third, fourth and left lateral ventricles. Underwent craniotomy right temporal for evacuation of hematoma 06/24/2017 per Dr. Cyndy Freeze. Maintained on Valproate for seizure prophylaxis. Latest follow-up CT scan 06/28/2017 showing no new mass effect or midline shift. EEG completed due to some somnolence 06/29/2017 negative for seizure with somnolence felt to be possibly due to narcotics. Patient had been placed on amantadine twice a day. His Eliquis was discontinued secondary to [place] but low dose aspirin had been initiated 06/26/2017. Placed on subcutaneous heparin for DVT prophylaxis 06/29/2017.  Physical and occupational therapy evaluations completed with recommendations of physical medicine rehabilitation consult. Patient was admitted for comprehensive rehabilitation program. Patient transferred to CIR on 06/30/2017 .    Patient currently  requires total with basic self-care skills secondary to muscle weakness, decreased cardiorespiratoy  endurance, decreased coordination, impaired visual skills that need to be evaluated further., decreased initiation and decreased attention and decreased sitting balance and decreased postural control.  Prior to hospitalization, patient was fully independent and active.  Patient will benefit from skilled intervention to increase independence with basic self-care skills prior to discharge home with care partner.  Anticipate patient will require minimal physical assistance and follow up home health.  OT - End of Session Endurance Deficit: Yes OT Assessment Rehab Potential (ACUTE ONLY): Good OT Barriers to Discharge: Inaccessible home environment;Home environment access/layout OT Barriers to Discharge Comments: pt lives in 2nd floor apt, daughter has town home but no bedroom or full bath on first floor OT Patient demonstrates impairments in the following area(s): Balance;Cognition;Endurance;Motor;Perception;Vision;Pain OT Basic ADL's Functional Problem(s): Eating;Grooming;Bathing;Dressing;Toileting OT Transfers Functional Problem(s): Toilet OT Additional Impairment(s): Fuctional Use of Upper Extremity OT Plan OT Intensity: Minimum of 1-2 x/day, 45 to 90 minutes OT Frequency: 5 out of 7 days OT Duration/Estimated Length of Stay: 20-21 days OT Treatment/Interventions: Balance/vestibular training;Cognitive remediation/compensation;Discharge planning;DME/adaptive equipment instruction;Functional mobility training;Neuromuscular re-education;Patient/family education;Self Care/advanced ADL retraining;Psychosocial support;Pain management;Therapeutic Activities;Therapeutic Exercise;UE/LE Strength taining/ROM;UE/LE Coordination activities;Visual/perceptual remediation/compensation OT Self Feeding Anticipated Outcome(s): supervision OT Basic Self-Care Anticipated Outcome(s): min A OT Toileting Anticipated Outcome(s): min A  OT Bathroom Transfers Anticipated Outcome(s): min A to [facility] OT Recommendation Patient  destination: Home Follow Up Recommendations: Home health OT Equipment Recommended: 3 in 1 bedside comode   Skilled Therapeutic Intervention Pt seen for initial evaluation and  Attempted ADL training, but he was too lethargic to participate.  His wife and son provided information that they have seen him actively use BUE to reach to scratch his chin or touch his hair.  With PROM, increased tone noted in B biceps but the evaluation of all his skills was severely limited as pt was only able to open his eyes for a second at a time 4x in the session.  He would respond with "hello" or other simple 1 word questions but only for 30% of the questions.  Worked on gentle neck PROM as pt holds head to his L side and educated his son on how to perform gentle massage to upper traps and asked his family to sit on pt's R side so he would have to look towards them.  Discussed role of OT and POC.  Due to limited evaluation, goals are set at min A for now but may need to be modified as pt becomes alert and his skill levels can be assessed more accurately.  Pt resting in tilt in space w/c in room with all needs met.   OT Evaluation Precautions/Restrictions  Precautions Precautions: Fall Restrictions Weight Bearing Restrictions: No General OT Amount of Missed Time: 30 Minutes PT Missed Treatment Reason: Other (Comment) (significant lethargy) Vital Signs Therapy Vitals Pulse Rate: 92 BP: (!) 151/102 Oxygen Therapy SpO2: 99 % O2 Device: Not Delivered Pain Pain Assessment Pain Assessment:  (Pt unable to express pain but signs facial grimaces & hands to head indicated possible head pain) Home Living/Prior Functioning Home Living Family/patient expects to be discharged to:: Private residence Living Arrangements: Spouse/significant other Available Help at Discharge: Available 24 hours/day Type of Home: Apartment Home Access: Stairs to enter [employer] of Steps: 10 step entry to upstairs  apartment Home Layout: One level  Lives With: Spouse Prior Function Level of Independence: Independent with basic ADLs, Independent with gait, Independent with homemaking with ambulation  Able to Take Stairs?: Yes Driving: Yes Vocation: Retired Leisure: Hobbies-yes (Comment) Comments: active in the community ADL ADL ADL Comments: refer to functional navigator Vision Patient Visual Report:  (pt would only open eyes for a second at a time 4x in 30 min) Vision Assessment?: Vision impaired- to be further tested in functional context Additional Comments: pt would only open eyes for a second at a time 4x in 30 min Perception  Perception: Not tested (too lethargic to participate) Praxis Praxis: Not tested (too lethargic to participate) Cognition Overall Cognitive Status: Difficult to assess Arousal/Alertness: Lethargic Orientation Level:  (too lethargic to participate) Sensation Sensation Light Touch: Not tested Stereognosis: Not tested Hot/Cold: Not tested Proprioception: Not tested Additional Comments: Pt lethargic and unable to respond to sensation or coordination testing  Coordination Fine Motor Movements are Fluid and Coordinated: Yes (per family, needs total A to eat/ drink) Motor  Motor Motor: Other (comment) (generalized weakness) Mobility  Bed Mobility Bed Mobility: Rolling Right;Rolling Left;Sit to Supine Rolling Right: 1: +2 Total assist Rolling Right Details: Manual facilitation for weight shifting;Tactile cues for sequencing;Verbal cues for sequencing Rolling Left: 1: +2 Total assist Rolling Left Details: Manual facilitation for weight shifting;Tactile cues for sequencing;Verbal cues for sequencing Sit to Supine: 1: +2 Total assist  Trunk/Postural Assessment  Cervical Assessment Cervical Assessment: Exceptions to [facility] (forward head) Thoracic Assessment Thoracic Assessment: Exceptions to [facility] (kyphotic ) Lumbar Assessment Lumbar Assessment: Exceptions to [facility]  (posterior pelvic tilt) Postural Control Postural Control: Deficits on evaluation (unable to maintain sitting balance; required max assist +2)  Balance Balance Balance Assessed: Yes Static Sitting Balance Static Sitting - Comment/# of Minutes: Pt required max assist for sitting balance +2. Displays L lateral lean  Dynamic Sitting Balance Sitting balance - Comments: Unable to follow commands for reaching  Extremity/Trunk Assessment RUE Assessment RUE Assessment: Not tested (due to lethargic state; pt able to lightly squeeze fingers) LUE Assessment LUE Assessment: Not tested (due to lethargic state; pt able to lightly squeeze fingers )   See Function Navigator for Current Functional Status.   Refer to Care Plan for Long Term Goals  Recommendations for other services: None - patient may benefit from neuropsych and therapeutic recreation at a later time    Discharge Criteria: Patient will be discharged from OT if patient refuses treatment 3 consecutive times without medical reason, if treatment goals not met, if there is a change in medical status, if patient makes no progress towards goals or if patient is discharged from hospital.  The above assessment, treatment plan, treatment alternatives and goals were discussed and mutually agreed upon: by family  [place] 07/01/2017, 12:47 PM

## 2017-07-02 ENCOUNTER — Inpatient Hospital Stay (HOSPITAL_COMMUNITY): Payer: Medicare Other | Admitting: Speech Pathology

## 2017-07-02 ENCOUNTER — Inpatient Hospital Stay (HOSPITAL_COMMUNITY): Payer: Medicare Other | Admitting: Occupational Therapy

## 2017-07-02 ENCOUNTER — Inpatient Hospital Stay (HOSPITAL_COMMUNITY): Payer: Medicare Other | Admitting: Physical Therapy

## 2017-07-02 LAB — URINE CULTURE

## 2017-07-02 MED ORDER — ACETAMINOPHEN 160 MG/5ML PO SOLN
650.0000 mg | ORAL | Status: DC | PRN
  Administered 2017-07-02: 650 mg via ORAL

## 2017-07-02 MED ORDER — ACETAMINOPHEN 650 MG RE SUPP: 650.0000 mg | RECTAL | Status: DC | PRN

## 2017-07-02 MED ORDER — ACETAMINOPHEN 325 MG PO TABS
650.0000 mg | ORAL_TABLET | Freq: Four times a day (QID) | ORAL | Status: DC
  Administered 2017-07-02 – 2017-07-22 (×79): 650 mg via ORAL
  Filled 2017-07-02 (×80): qty 2

## 2017-07-02 MED ORDER — VALPROATE SODIUM 250 MG/5ML PO SOLN
500.0000 mg | Freq: Two times a day (BID) | ORAL | Status: DC
  Administered 2017-07-02 – 2017-07-15 (×28): 500 mg via ORAL
  Filled 2017-07-02 (×32): qty 10

## 2017-07-02 MED ORDER — ACETAMINOPHEN 325 MG PO TABS
650.0000 mg | ORAL_TABLET | ORAL | Status: DC | PRN
  Administered 2017-07-02 (×2): 650 mg via ORAL
  Filled 2017-07-02 (×4): qty 2

## 2017-07-02 NOTE — Progress Notes (Signed)
Speech Language Pathology Daily Make-Up Session Note  Patient Details  Name: Randy Durham Tassinari MRN: [id number] Date of Birth: January 25, 1934  Today's Date: 07/02/2017 SLP Individual Time: 1430-1500 (make-up session) SLP Individual Time Calculation (min): 30 min  Short Term Goals: Week 1: SLP Short Term Goal 1 (Week 1): Pt will demonstrate focused attention to functional task for 30 seconds with Max multimodal cues. SLP Short Term Goal 2 (Week 1): Pt will consistently demonstrate O x 4 with Max multimodal cues. SLP Short Term Goal 3 (Week 1): Pt will answer yes/no questions with 30% accuracy using max multimodal cues. SLP Short Term Goal 4 (Week 1): Pt will communicate basic wants and needs via multimodal communciation with Max A verbal cues. SLP Short Term Goal 5 (Week 1): Pt will initate basic familar ADL tasks in 30% of opportunties with Max multimodal cues. SLP Short Term Goal 6 (Week 1): Patient will consume current diet with minimal overt s/s of aspiration with Mod A verbal cues for use of swallowing compensatory strategies.   Skilled Therapeutic Interventions: Skilled treatment session focused on dysphagia and cognitive goals. Upon arrival, patient was awake but appeared lethargic while supine in bed. Patient repositioned in bed to maximize arousal and alertness for PO intake. Patient consumed minimal amounts of his lunch meal of Dys. 2 textures with thin liquids without overt s/s of aspiration but demonstrated mildly prolonged mastication due to decreased awareness of bolus and lethargy. Patient also required Max A verbal cues to keep his eyes open for 1-2 minute intervals and total A for self-feeding. Recommend patient continue current diet but stressed the importance of patient being awake and alert prior to PO intake. Family verbalized understanding and agreement. Patient left supine in bed with all needs within reach and family present. Continue with current plan of care.       Function:  Eating Eating   Modified Consistency Diet: Yes Eating Assist Level: Set up assist for;Supervision or verbal cues;Helper scoops food on utensil   Eating Set Up Assist For: Opening containers Helper Scoops Food on Utensil: Every scoop     Cognition Comprehension Comprehension assist level: Understands basic less than 25% of the time/ requires cueing >75% of the time  Expression   Expression assist level: Expresses basis less than 25% of the time/requires cueing >75% of the time.  Social Interaction Social Interaction assist level: Interacts appropriately less than 25% of the time. May be withdrawn or combative.  Problem Solving Problem solving assist level: Solves basic less than 25% of the time - needs direction nearly all the time or does not effectively solve problems and may need a restraint for safety  Memory Memory assist level: Recognizes or recalls less than 25% of the time/requires cueing greater than 75% of the time    Pain No/Denies Pain   Therapy/Group: Individual Therapy  Choua Ikner 07/02/2017, 4:12 PM

## 2017-07-02 NOTE — Progress Notes (Addendum)
[facility]patient [facility] Individual Statement of Services  Patient Name:  Randy Durham  Date:  07/02/2017  Welcome to the [facility].  Our goal is to provide you with an individualized program based on your diagnosis and situation, designed to meet your specific needs.  With this comprehensive rehabilitation program, you will be expected to participate in at least 3 hours of rehabilitation therapies Monday-Friday, with modified therapy programming on the weekends.  Your rehabilitation program will include the following services:  Physical Therapy (PT), Occupational Therapy (OT), Speech Therapy (ST), 24 hour per day rehabilitation nursing, Case Management (Social Worker), Rehabilitation Medicine, Nutrition Services and Pharmacy Services  Weekly team conferences will be held on Tuesdays to discuss your progress.  Your Social Worker will talk with you frequently to get your input and to update you on team discussions.  Team conferences with you and your family in attendance may also be held.  Expected length of stay:  20 to 21 days  Overall anticipated outcome:  Minimal assistance  Depending on your progress and recovery, your program may change. Your Social Worker will coordinate services and will keep you informed of any changes. Your Social Worker's name and contact numbers are listed  below.  The following services may also be recommended but are not provided by the Inpatient [facility] Center:   Driving Evaluations  Home Health Rehabiltiation Services  Outpatient Rehabilitation Services   Arrangements will be made to provide these services after discharge if needed.  Arrangements include referral to agencies that provide these services.  Your insurance has been verified to be:  Medicare and AARP Your primary doctor is:  Dr. Renford Dillsonald Polite  Pertinent information will be shared with your doctor and your insurance company.  Social Worker:  Staci AcostaJenny Liba Hulsey,  LCSW  [phone] or [phone]) [phone]  Information discussed with and copy given to patient by: Elvera LennoxPrevatt, Gertrude Bucks Capps, 07/02/2017, 1:10 PM

## 2017-07-02 NOTE — Progress Notes (Signed)
Physical Therapy Session Note  Patient Details  Name: Randy Durham MRN: [id number] Date of Birth: 28-Jul-1934  Today's Date: 07/02/2017 PT Individual Time: [id number] and 1306-1401 PT Individual Time Calculation (min): 58 min and 55 min   Short Term Goals: Week 1:  PT Short Term Goal 1 (Week 1): Pt will roll with mod assist and min cues  PT Short Term Goal 2 (Week 1): Pt will maintain static sitting balance with min assist  PT Short Term Goal 3 (Week 1): Pt will supine<>sit with mod assist   Skilled Therapeutic Interventions/Progress Updates:  Treatment 1: Pt received in room with son [name] present. Pt lethargic, requiring cuing to open eyes, but able to engage in conversation. Therapist donned pt's ted hose total assist for time management.  Attempted to have pt transfer to sitting EOB but pt unable to follow commands and multimodal cuing to initiate movement, therefore maximove utilized to transfer pt bed>TIS w/c. Pt requires total assist for rolling L<>R with placement for UE/LE placement, initiation and sequencing of movement for sling placement. Once in w/c therapist adjusted head rest for increased comfort and positioning. Pt engaged in conversation focusing on orientation and cognition; pt able to [facility] and recall name of children but requires assistance to recall date and other information (correct number of grandchildren, years married to his wife, etc.). Pt consumed breakfast with max cuing to hold cup and spoon to self feed. Pt also requires max cuing to locate and attend to items on L side; pt presents with impaired depth perception and spatial awareness. At end of session pt left sitting in w/c with seat belt donned and son present to supervise. During session pt with behaviors indicating headache & pt reporting frontal headache, RN made aware.   During session pt reported need to urinate but unable to void in urinal.    Treatment 2: Pt received in bed  with son [name] & wife [name](Lucille) present to observe session. Pt without verbal c/o pain but held head as if it hurt; RN already notified PT that pt unable to receive pain medication until later this afternoon. Pt noted to be incontinent of urine and required total assist for LE and UE placement, max multimodal cuing for sequencing for rolling L<>R in bed with use of bed rails; pt with poor initiation and engagement in movement. Therapist provides total assist for peri hygiene and donning new brief & gown. Pt transferred bed>TIS w/c via MaxiSky total assist. Transported pt to dayroom via w/c total assist and transferred to standing in standing frame. Pt able to tolerate standing x 5 minutes with significant anterior lean and inability to hold upper body upright despite multimodal cuing. Pt reported he felt "strange" but denied feelings of lightheadedness or dizziness. Pt then noted to be incontinent of bowels and returned to room and back to bed via MaxiSky. Pt left in care of RN & NT. Throughout session pt with great difficulty following one step commands and requires max/total assist to initiate movement for anterior weight shifting when sitting in TIS w/c.   Therapy Documentation Precautions:  Precautions Precautions: Fall Restrictions Weight Bearing Restrictions: No   See Function Navigator for Current Functional Status.   Therapy/Group: Individual Therapy  Sandi MariscalVictoria M Willye Javier 07/02/2017, 3:59 PM

## 2017-07-02 NOTE — Progress Notes (Signed)
Occupational Therapy Session Note  Patient Details  Name: Randy Durham MRN: [id number] Date of Birth: 1934/07/02  Today's Date: 07/02/2017 OT Individual Time: 1000-1056 OT Individual Time Calculation (min): 56 min    Short Term Goals: Week 1:  OT Short Term Goal 1 (Week 1): Pt will maintain attention and alertness for 10 min to participate in bathing. OT Short Term Goal 2 (Week 1): Pt will be able to bring a cup to his mouth with min A. OT Short Term Goal 3 (Week 1): Pt will be able to bathe UB with mod-max A.  OT Short Term Goal 4 (Week 1): Pt will be able to sit unsupported for 10 min with mod A to progress to sitting on a BSC.  Skilled Therapeutic Interventions/Progress Updates:    Upon entering the room, pt seated in wheelchair with family present in room. Pt awake and agreeable to OT intervention. Pt verbalizing need to use bathroom when asked. Pt standing into STEDY with +2 assistance and taken into bathroom. Pt transferred onto elevated toilet with STEDY and +2 assistance. Pt needing assistance for clothing management. Pt unable to void while seated on toilet. Pt began to fatigue and closing eyes. Pt standing from toileting with increased effort from therapist and helper. Pt returned to wheelchair via STEDY. Pt unable to keep eyes open. Pt answering questions if asked and given increased time. OT played Raford Pitchereil Diamond music as it is pt's favorite Tree surgeonartist. Pt humming some of music but continued to not open eyes. Pt reclined in tilt in space with quick release belt donned. All needs within reach.   Therapy Documentation Precautions:  Precautions Precautions: Fall Restrictions Weight Bearing Restrictions: No General:   Vital Signs: Therapy Vitals Temp: 97.8 F (36.6 C) Temp Source: Oral Pulse Rate: 98 Resp: 18 BP: (!) 148/92 Patient Position (if appropriate): Lying Oxygen Therapy SpO2: 98 % O2 Device: Not Delivered Pain:   ADL: ADL ADL Comments: refer to functional  navigator Vision   Perception    Praxis   Exercises:   Other Treatments:    See Function Navigator for Current Functional Status.   Therapy/Group: Individual Therapy  Alen BleacherBradsher, Elisavet Buehrer P 07/02/2017, 5:07 PM

## 2017-07-02 NOTE — Evaluation (Signed)
Speech Language Pathology Bedside Swallow Evaluation   Patient Details  Name: Randy Durham MRN: [id number] Date of Birth: Jul 27, 1934  SLP Diagnosis: Dysphagia  Rehab Potential: Good ELOS: 20-21 days    Today's Date: 07/02/2017 SLP Individual Time: 0930-1000 SLP Individual Time Calculation (min): 30 min   Problem List:  Patient Active Problem List   Diagnosis Date Noted  . Somnolence   . Intracerebral hemorrhage 06/24/2017  . Hemorrhage of right temporal lobe ([place]) 06/24/2017  . Intraventricular hemorrhage ([place]) 06/24/2017  . Cytotoxic brain edema ([place]) 06/24/2017  . Paroxysmal atrial fibrillation ([place]) 03/30/2015  . Bradycardia 03/27/2015  . NSVT (nonsustained ventricular tachycardia) ([place]) 03/27/2015  . CAD- occluded distal LAD, 75-80% CFX- medical Rx 03/27/2015  . NSTEMI 03/25/15 03/25/2015  . Chronic kidney disease (CKD), stage III (moderate)   . Obesity (BMI 30-39.9)   . Spinal stenosis   . Hypertension   . Hyperlipidemia    Past Medical History:  Past Medical History:  Diagnosis Date  . Anemia   . CAD (coronary artery disease)    Cath 03/26/2015 100% distal LAD stenosis, 80% ostial D1 stenosis, 75% ramus stenosis, 60% RPDA stenosis, 30% proximal RCA stenosis. Medical therapy, outpt myoview to assess LCx lesion  . Chronic kidney disease (CKD), stage III (moderate)   . Hyperlipidemia   . Hypertension   . Hypothyroidism   . Obesity (BMI 30-39.9)   . Spinal stenosis   . Thyroid nodule    Past Surgical History:  Past Surgical History:  Procedure Laterality Date  . CARDIAC CATHETERIZATION N/A 03/26/2015   Procedure: Left Heart Cath and Coronary Angiography;  Surgeon: Lorretta Harp, MD;  Location: [facility] [facility];  Service: Cardiovascular;  Laterality: N/A;  . CRANIOTOMY Right 06/24/2017   Procedure: CRANIOTOMY HEMATOMA EVACUATION SUBDURAL;  Surgeon: Ashok Pall, MD;  Location: [facility];  Service: Neurosurgery;  Laterality: Right;  . PILONIDAL CYST EXCISION     . TONSILLECTOMY      Assessment / Plan / Recommendation Clinical Impression Patient was administered a BSE. Patient consumed large, consecutive sips of thin liquids via straw without overt s/s of aspiration and demonstrated what appeared to be a timely swallow initiation. Patient also demonstrated efficient and mildly prolonged mastication of soft and regular textures, suspect due to lethargy and decreased awareness of bolus. Recommend patient upgrade to Dys. 2 textures with full supervision. Prognosis for upgrade is good as patient's cognition and overall arousal continues to improve. Patient's son present and provided appropriate cueing throughout session for self-feeding and use of safe swallowing strategies.    Skilled Therapeutic Interventions          Administered a BSE. Please see above for details. Patient was upright in the wheelchair and appeared more alert this session. However, patient required Max A verbal cues to keep his eyes opens and for initiation of self-feeding throughout the session. Patient's son present and provided education in regards to patient's current cognitive impairments and their impact on his swallowing function. He verbalized understanding and agreement. Patient left upright in wheelchair with son present. Continue with current plan of care.    SLP Assessment  Patient will need skilled Speech Lanaguage Pathology Services during CIR admission    Recommendations  SLP Diet Recommendations: Dysphagia 2 (Fine chop);Thin Liquid Administration via: Cup Medication Administration: Crushed with puree Supervision: Full supervision/cueing for compensatory strategies;Patient able to self feed Compensations: Minimize environmental distractions;Slow rate;Small sips/bites Postural Changes and/or Swallow Maneuvers: Seated upright 90 degrees Recommendations for Other Services: Neuropsych consult  Patient destination: Home Follow up Recommendations: Home Health SLP;24 hour  supervision/assistance Equipment Recommended: None recommended by SLP    SLP Frequency 3 to 5 out of 7 days   SLP Duration  SLP Intensity  SLP Treatment/Interventions 20-21 days  Minumum of 1-2 x/day, 30 to 90 minutes  Dysphagia/aspiration precaution training;Patient/family education;Functional tasks    Pain No/Denies Pain   Function:  Eating Eating   Modified Consistency Diet: Yes Eating Assist Level: Set up assist for;Supervision or verbal cues;Helper scoops food on utensil   Eating Set Up Assist For: Opening containers Helper Yoder on Utensil: Every scoop     Cognition Comprehension Comprehension assist level: Understands basic less than 25% of the time/ requires cueing >75% of the time  Expression   Expression assist level: Expresses basis less than 25% of the time/requires cueing >75% of the time.  Social Interaction Social Interaction assist level: Interacts appropriately less than 25% of the time. May be withdrawn or combative.  Problem Solving Problem solving assist level: Solves basic less than 25% of the time - needs direction nearly all the time or does not effectively solve problems and may need a restraint for safety  Memory Memory assist level: Recognizes or recalls less than 25% of the time/requires cueing greater than 75% of the time   Short Term Goals: Week 1: SLP Short Term Goal 1 (Week 1): Pt will demonstrate focused attention to functional task for 30 seconds with Max multimodal cues. SLP Short Term Goal 2 (Week 1): Pt will consistently demonstrate O x 4 with Max multimodal cues. SLP Short Term Goal 3 (Week 1): Pt will answer yes/no questions with 30% accuracy using max multimodal cues. SLP Short Term Goal 4 (Week 1): Pt will communicate basic wants and needs via multimodal communciation with Max A verbal cues. SLP Short Term Goal 5 (Week 1): Pt will initate basic familar ADL tasks in 30% of opportunties with Max multimodal cues. SLP Short Term Goal  6 (Week 1): Patient will consume current diet with minimal overt s/s of aspiration with Mod A verbal cues for use of swallowing compensatory strategies.   Refer to Care Plan for Long Term Goals  Recommendations for other services: None   Discharge Criteria: Patient will be discharged from SLP if patient refuses treatment 3 consecutive times without medical reason, if treatment goals not met, if there is a change in medical status, if patient makes no progress towards goals or if patient is discharged from hospital.  The above assessment, treatment plan, treatment alternatives and goals were discussed and mutually agreed upon: by patient and by family  Forestine Macho 07/02/2017, 4:05 PM

## 2017-07-02 NOTE — Progress Notes (Signed)
[place] PHYSICAL MEDICINE & REHABILITATION     PROGRESS NOTE    Subjective/Complaints: Son says he "started to wake up" yesterday afternoon. Evening was a little restless with headache being a complaint.   ROS: pt denies nausea, vomiting, diarrhea, cough, shortness of breath or chest pain   Objective: Vital Signs: Blood pressure (!) 155/98, pulse 88, temperature 97.7 F (36.5 C), temperature source Oral, resp. rate 20, height 5\' 3"  (1.6 m), weight 82.1 kg (181 lb), SpO2 97 %. No results found.  Recent Labs  06/30/17 0833 07/01/17 0534  WBC 7.8 7.8  HGB 12.4* 13.5  HCT 36.6* 40.0  PLT 267 291    Recent Labs  06/30/17 0833 07/01/17 0534  NA 134* 134*  K 3.5 3.7  CL 101 100*  GLUCOSE 104* 90  BUN 12 10  CREATININE 0.78 0.90  CALCIUM 8.4* 8.5*   CBG (last 3)  No results for input(s): GLUCAP in the last 72 hours.  Wt Readings from Last 3 Encounters:  07/02/17 82.1 kg (181 lb)  06/30/17 81.6 kg (180 lb)  03/25/17 82.1 kg (181 lb)    Physical Exam:  Constitutional: No distress.  HENT:  Head: Normocephalic.  Mouth/Throat: Oropharynx is clear and moist.  Eyes: open/tracking today Neck: Normal range of motion. Neck supple. No JVDpresent. No tracheal deviationpresent. No thyromegalypresent.  Cardiovascular: RRR without murmur. No JVD   Respiratory: CTA Bilaterally without wheezes or rales. Normal effort   GI: Soft. Bowel sounds are normal. He exhibits no distension. There is no tenderness. There is no rebound.  Skin: Skin is warm.  Skin warm and dry. Craniotomy site clean and dry Neurological.more alert. Normal language. Eyes open. Follows simple commands. Told me what part of [place] his family is from . Moves all 4's spontaneously.  Marland Kitchen   Psych:  pleasant  Assessment/Plan: 1. Functional and cognitive deficits secondary to right temporal ICH which require 3+ hours per day of interdisciplinary therapy in a comprehensive inpatient rehab setting. Physiatrist is  providing close team supervision and 24 hour management of active medical problems listed below. Physiatrist and rehab team continue to assess barriers to discharge/monitor patient progress toward functional and medical goals.  Function:  Bathing Bathing position      Bathing parts      Bathing assist        Upper Body Dressing/Undressing Upper body dressing   What is the patient wearing?: Hospital gown                Upper body assist        Lower Body Dressing/Undressing Lower body dressing   What is the patient wearing?: Hospital Gown                              Lower body assist        Toileting Toileting Toileting activity did not occur: No continent bowel/bladder event        Toileting assist     Transfers Chair/bed transfer   Chair/bed transfer method: Other Chair/bed transfer assist level: dependent (Pt equals 0%) Chair/bed transfer assistive device: Mechanical lift Mechanical lift: Maximove   Locomotion Ambulation Ambulation activity did not occur: Safety/medical concerns (fatigue)         Wheelchair Wheelchair activity did not occur: Safety/medical concerns        Cognition Comprehension Comprehension assist level: Understands basic less than 25% of the time/ requires cueing >75% of the time  Expression Expression assist level: Expresses basis less than 25% of the time/requires cueing >75% of the time.  Social Interaction Social Interaction assist level: Interacts appropriately less than 25% of the time. May be withdrawn or combative.  Problem Solving Problem solving assist level: Solves basic less than 25% of the time - needs direction nearly all the time or does not effectively solve problems and may need a restraint for safety  Memory Memory assist level: Recognizes or recalls less than 25% of the time/requires cueing greater than 75% of the time   Medical Problem List and Plan: 1. Decreased functional mobilitysecondary to  right temporal ICH with IVH status post craniotomy for hematoma evacuation 06/24/2017 -continue therapies  -normalize sleep/wake. More alert today 2. DVT Prophylaxis/Anticoagulation: Subcutaneous heparin for DVT prophylaxis initiated 06/29/2017 3. Pain Management/back pain: Discontinue hydrocodone due to somnolence. Monitor mental status 4. Mood:Amantadine 100 mg twice a day 0700 and 1200 5. Neuropsych: This patient is notcapable of making decisions on hisown behalf. -continue sleep chart -limit neuro-sedating medications, suspect some of lethargy is from medication provided at night for confusion/restlessness -maintain HS seroquel  -continue day time amantadine  -stimulate during day  -ua/ucx neg so far. VPA level low normal 6. Skin/Wound Care: Routine skin checks 7. Fluids/Electrolytes/Nutrition: encourage PO as possible  -I personally reviewed the patient's labs today.   -add vpa level to labs  8.Seizure prophylaxis. Valproate 500 mg every 12 hours. EEG negative  -VPA level 55 on 8/29 9.CAD with cardiac catheterization 03/26/2015. Continue low-dose aspirin. 10.CKD stage III. Follow-up chemistries on admission 11.Hypertension/PAF. Lisinopril 5 mg daily, Lopressor 12.5 mg twice a day. Cardiac rate controlled at present 12.Hyperlipidemia. Lipitor 13.Hypothyroidism. Synthroid. Follow up levels as outpt.    LOS (Days) 2 A FACE TO FACE EVALUATION WAS PERFORMED  Ranelle OysterSWARTZ,Dorsey Authement T, MD 07/02/2017 9:19 AM

## 2017-07-03 ENCOUNTER — Inpatient Hospital Stay (HOSPITAL_COMMUNITY): Payer: Medicare Other | Admitting: Physical Therapy

## 2017-07-03 ENCOUNTER — Inpatient Hospital Stay (HOSPITAL_COMMUNITY): Payer: Medicare Other | Admitting: Occupational Therapy

## 2017-07-03 ENCOUNTER — Inpatient Hospital Stay (HOSPITAL_COMMUNITY): Payer: Medicare Other | Admitting: Speech Pathology

## 2017-07-03 NOTE — Progress Notes (Addendum)
Initial Nutrition Assessment  DOCUMENTATION CODES:   Obesity unspecified  INTERVENTION:   Follow up on calorie count results 9/4.   Ensure Enlive po TID, each supplement provides 350 kcal and 20 grams of protein  Magic cup TID with meals, each supplement provides 290 kcal and 9 grams of protein  NUTRITION DIAGNOSIS:   Inadequate oral intake related to lethargy/confusion as evidenced by meal completion < 50%, per patient/family report.  GOAL:   Patient will meet greater than or equal to 90% of their needs  MONITOR:   PO intake, Diet advancement, I & O's  REASON FOR ASSESSMENT:   Consult Calorie Count  ASSESSMENT:   Pt with PMH of CAD, NSTEMI, PAF, HTN, CKD stage III who was recently hospitalized for large R ICH and s/p crani is not admitted to rehab.    Wife, son and daughter at bedside and provide hx. Pt had no wt changes PTA and had a good appetite. Pt ate yesterday per family but has not ate anything today. He did drink some of an ensure. Per family he has been sleeping almost all day.  RD completed nutrition-focused physical exam, pt did not wake and did not open eyes or participate during the exam. No findings except pale nail beds.  48 hour Calorie count ordered.   Labs reviewed: Na 134 (L)   Diet Order:  DIET DYS 2 Room service appropriate? Yes; Fluid consistency: Thin  Skin:   (R head incision)  Last BM:  8/30  Height:   Ht Readings from Last 1 Encounters:  06/30/17 5\' 3"  (1.6 m)    Weight:   Wt Readings from Last 1 Encounters:  07/02/17 181 lb (82.1 kg)    Ideal Body Weight:  56.3 kg  BMI:  Body mass index is 32.06 kg/m.  Estimated Nutritional Needs:   Kcal:  1700-1900  Protein:  95-110 grams  Fluid:  > 1.7 L/day  EDUCATION NEEDS:   No education needs identified at this time  Kendell BaneHeather Omaira Mellen RD, LDN, CNSC [phone] Pager [phone] After Hours Pager

## 2017-07-03 NOTE — Progress Notes (Addendum)
Physical Therapy Session Note  Patient Details  Name: Randy Durham MRN: [id number] Date of Birth: Aug 11, 1934  Today's Date: 07/03/2017 PT Individual Time: [id number] and [id number] PT Individual Time Calculation (min): 27 min and 18 min   Short Term Goals: Week 1:  PT Short Term Goal 1 (Week 1): Pt will roll with mod assist and min cues  PT Short Term Goal 2 (Week 1): Pt will maintain static sitting balance with min assist  PT Short Term Goal 3 (Week 1): Pt will supine<>sit with mod assist   Skilled Therapeutic Interventions/Progress Updates:  Treatment 1: Pt received asleep in bed with family (wife Malena CatholicLucille, son Renae Fickleaul, daughter Estil DaftMary Susan) present in room. Pt unable to open eyes and follow commands; per family and rehab tech pt was unable to arouse during morning OT session. Therapist and rehab tech provided total assist for rolling L<>R with assistance for UE/LE placement and initiating and completing movement to allow sling to be placed total assist. Pt transferred bed>TIS w/c via MaxiSky. Therapist attempted to increase pt's alertness by washing face with wet washcloth, sternal rub, talking to him, and pushing him around unit in w/c. Pt able to verbalize a few nonsensical sentences but unable to open eyes or engage in therapeutic activity. Pt returned to room & left in TIS w/c with seat belt donned & family present to supervise. Pt missed 33 minutes of skilled PT treatment 2/2 lethargy.  Treatment 2: Pt received in TIS w/c with family present. Family reports they have been educated on, and have been performing pressure relief for pt while he sits in TIS w/c. Pt unable to open eyes despite multimodal cuing, therapist attempting to engage him in conversation, and sternal rub. Pt transferred back to bed via MaxiSky. Pt noted to have smear of incontinent BM. Pt required dependent assistance to roll L<>R for peri care and donning of clean brief. At end of session pt left in bed with family present to  supervise, educated them on need to notify RN when they left & they voiced understanding, RN aware also. Pt missed 12 minutes of skilled PT treatment 2/2 lethargy.  Addendum: Pt able to voice a few words but no complete sentences.  Therapy Documentation Precautions:  Precautions Precautions: Fall Restrictions Weight Bearing Restrictions: No   General: PT Amount of Missed Time (min): 33 Minutes + 12 minutes PT Missed Treatment Reason: Patient fatigue (pt lethargic, unable to open eyes)   See Function Navigator for Current Functional Status.   Therapy/Group: Individual Therapy  Sandi MariscalVictoria M Lon Klippel 07/03/2017, 3:26 PM

## 2017-07-03 NOTE — Progress Notes (Addendum)
Occupational Therapy Session Note  Patient Details  Name: Randy Durham MRN: [id number] Date of Birth: Jul 16, 1934  Today's Date: 07/03/2017 OT Individual Time: [id number] OT Individual Time Calculation (min): 66 min    Short Term Goals: Week 1:  OT Short Term Goal 1 (Week 1): Pt will maintain attention and alertness for 10 min to participate in bathing. OT Short Term Goal 2 (Week 1): Pt will be able to bring a cup to his mouth with min A. OT Short Term Goal 3 (Week 1): Pt will be able to bathe UB with mod-max A.  OT Short Term Goal 4 (Week 1): Pt will be able to sit unsupported for 10 min with mod A to progress to sitting on a BSC.  Skilled Therapeutic Interventions/Progress Updates:    Tx focus on alertness, attention, and awareness during self care completion.   Pt greeted supine in bed with son Renae Fickleaul present. Pt unable to open eyes with sternal rub/cold wash cloth to face. Tried to increase alertness while RN was present to administer medication. Listened to his favorite upbeat Elvis songs while RN and OT danced with him. Pt still with significant lethargy, holding Lt side of head. HOH for face/chest washing during BADLs bedlevel. Pt not localizing to touch/light pinches with OT positioned LEs to wash/don Teds and gripper socks. HOH for grasping bedrail during rolling L<R for changing soiled brief with pt releasing grip instantly when OT removed handheld support. 2 helpers required for LB self care. Son opened pts eyes periodically during tx with pt unable to track stimulus or direct gaze towards people around him. Pt repositioned to Lt side for pressure relief. Pt left with 4 bedrails up and bed in lowest position with son at bedside.    No spontaneous Rt arm movement/activity engagement during tx.  Therapy Documentation Precautions:  Precautions Precautions: Fall Restrictions Weight Bearing Restrictions: No General: General PT Missed Treatment Reason: Patient fatigue (pt lethargic,  unable to open eyes) Pain: Pt holding Lt side of head and grimacing during tx. RN made aware.    ADL: ADL ADL Comments: refer to functional navigator     See Function Navigator for Current Functional Status.   Therapy/Group: Individual Therapy  Milayah Krell A Lailie Smead 07/03/2017, 12:19 PM

## 2017-07-03 NOTE — Progress Notes (Signed)
Social Work Assessment and Plan  Patient Details  Name: Randy Durham MRN: [id number] Date of Birth: [birth year]  Today's Date: 07/01/2017  Problem List:  Patient Active Problem List   Diagnosis Date Noted  . Somnolence   . Intracerebral hemorrhage 06/24/2017  . Hemorrhage of right temporal lobe ([place]) 06/24/2017  . Intraventricular hemorrhage ([place]) 06/24/2017  . Cytotoxic brain edema ([place]) 06/24/2017  . Paroxysmal atrial fibrillation ([place]) 03/30/2015  . Bradycardia 03/27/2015  . NSVT (nonsustained ventricular tachycardia) ([place]) 03/27/2015  . CAD- occluded distal LAD, 75-80% CFX- medical Rx 03/27/2015  . NSTEMI 03/25/15 03/25/2015  . Chronic kidney disease (CKD), stage III (moderate)   . Obesity (BMI 30-39.9)   . Spinal stenosis   . Hypertension   . Hyperlipidemia    Past Medical History:  Past Medical History:  Diagnosis Date  . Anemia   . CAD (coronary artery disease)    Cath 03/26/2015 100% distal LAD stenosis, 80% ostial D1 stenosis, 75% ramus stenosis, 60% RPDA stenosis, 30% proximal RCA stenosis. Medical therapy, outpt myoview to assess LCx lesion  . Chronic kidney disease (CKD), stage III (moderate)   . Hyperlipidemia   . Hypertension   . Hypothyroidism   . Obesity (BMI 30-39.9)   . Spinal stenosis   . Thyroid nodule    Past Surgical History:  Past Surgical History:  Procedure Laterality Date  . CARDIAC CATHETERIZATION N/A 03/26/2015   Procedure: Left Heart Cath and Coronary Angiography;  Surgeon: Lorretta Harp, MD;  Location: [facility];  Service: Cardiovascular;  Laterality: N/A;  . CRANIOTOMY Right 06/24/2017   Procedure: CRANIOTOMY HEMATOMA EVACUATION SUBDURAL;  Surgeon: Ashok Pall, MD;  Location: [facility];  Service: Neurosurgery;  Laterality: Right;  . PILONIDAL CYST EXCISION    . TONSILLECTOMY     Social History:  reports that he has quit smoking. His smoking use included Cigars and Pipe. He smoked 0.25 packs per day. He has never used smokeless  tobacco. He reports that he drinks alcohol. He reports that he does not use drugs.  Family / Support Systems Marital Status: Married How Long?: 78 years in december Patient Roles: Spouse, Parent, Volunteer Spouse/Significant Other: Binyomin Brann - wife - [phone] Children: 3 children - 2 sons and 1 dtr Other Supports: church family and friends Anticipated Caregiver: wife and daughter Ability/Limitations of Caregiver: wife needs pt to be able to manage pt at home Caregiver Availability: 24/7 Family Dynamics: close, supportive family  Social History Preferred language: English Religion:  Read: Yes Write: Yes Employment Status: Retired Date Retired/Disabled/Unemployed: 1999 Age Retired: 7 Public relations account executive Issues: none reported Guardian/Conservator: MD has stated that pt is not fully capable of making his own decisions.  Next of kin for decision making is his wife.   Abuse/Neglect Physical Abuse: Denies Verbal Abuse: Denies Sexual Abuse: Denies Exploitation of patient/patient's resources: Denies Self-Neglect: Denies  Emotional Status Pt's affect, behavior and adjustment status: Pt was lethargic during CSW visit, so was not able to assess.  CSW to continue to monitor/assess this throughout his stay as his cognitive status improves. Recent Psychosocial Issues: none reported Psychiatric History: none reported Substance Abuse History: none reported  Patient / Family Perceptions, Expectations & Goals Pt/Family understanding of illness & functional limitations: Pt's family has a good understanding of pt's condition and feel that the medical team has given good information and answered their questions. Premorbid pt/family roles/activities: Pt volunteered at the church a lot and spent time running errands for and with his wife.  Anticipated changes in roles/activities/participation: Pt will not be able to drive or volunteer at d/c, but family hopes he will eventually  return to these activities. Pt/family expectations/goals: Pt's family would like for pt to get to a level where they can manage him at home.  Community Resources Express Scripts: None Premorbid Home Care/DME Agencies: None Transportation available at discharge: family - Wife has not been driving, but can drive.  Plans to practice so that she can drive pt to appts and run errands, as needed. Resource referrals recommended: Neuropsychology, Support group (specify)  Discharge Planning Living Arrangements: Spouse/significant other Support Systems: Spouse/significant other, Children, Friends/neighbors, Church/faith community Type of Residence: Private residence Insurance Resources: [employer], Multimedia programmer (specify) Web designer for secondary) Financial Resources: Radio broadcast assistant Screen Referred: No Money Management: Spouse Does the patient have any problems obtaining your medications?: No Home Management: Wife cares for inside of home. Patient/Family Preliminary Plans: [name] and son hope that pt can return home and his care be managed by wife.  They recognize that pt may need further reha, potentionally at SNF. Social Work Anticipated Follow Up Needs: HH/OP, Support Group Expected length of stay: 20 to 21 days  Clinical Impression CSW met with pt and his wife and son to introduce self and role of CSW, as well as to complete assessment.  Pt was sleeping during most of visit, so CSW will talk with him at another time when he is more alert.  Pt has very supportive family.  Wife and son are hopeful that he will improve cognitively so that he can return home with wife, but wife was relieved to hear that SNF could be a back up plan if he requires more care than what can be provided at home.  Pt has 15/16 stairs to climb to apartment, so pt's dtr has offered to let pt/wife come to her townhome at d/c if that would be easier for pt.  Wife even stated they could "swap" residences for a little  while to give dtr some privacy.  Support given to wife/son in this difficult time.  They appreciate that pt was able to come to CIR.  CSW will continue to follow and assist as needed.  Keslyn Teater, Silvestre Mesi 07/03/2017, 11:19 AM

## 2017-07-03 NOTE — Progress Notes (Addendum)
[facility] PHYSICAL MEDICINE & REHABILITATION     PROGRESS NOTE    Subjective/Complaints: Son says he "started to wake up" yesterday afternoon. Evening was a little restless with headache being a complaint.   ROS: pt denies nausea, vomiting, diarrhea, cough, shortness of breath or chest pain   Objective: Vital Signs: Blood pressure (!) 174/74, pulse 70, temperature 98 F (36.7 C), temperature source Oral, resp. rate 16, height 5\' 3"  (1.6 m), weight 82.1 kg (181 lb), SpO2 99 %. No results found.  Recent Labs  07/01/17 0534  WBC 7.8  HGB 13.5  HCT 40.0  PLT 291    Recent Labs  07/01/17 0534  NA 134*  K 3.7  CL 100*  GLUCOSE 90  BUN 10  CREATININE 0.90  CALCIUM 8.5*   CBG (last 3)  No results for input(s): GLUCAP in the last 72 hours.  Wt Readings from Last 3 Encounters:  07/02/17 82.1 kg (181 lb)  06/30/17 81.6 kg (180 lb)  03/25/17 82.1 kg (181 lb)    Physical Exam:  Constitutional: No distress.  HENT:  Head: Normocephalic.  Mouth/Throat: Oropharynx is clear and moist.  Eyes: open/tracking today Neck: Normal range of motion. Neck supple. No JVDpresent. No tracheal deviationpresent. No thyromegalypresent.  Cardiovascular: RRR without murmur. No JVD   Respiratory: CTA Bilaterally without wheezes or rales. Normal effort   GI: Soft. Bowel sounds are normal. He exhibits no distension. There is no tenderness. There is no rebound.  Skin: Skin is warm.  Skin warm and dry. Craniotomy site clean and dry Neurological.more alert. Normal language. Eyes open. Follows simple commands. [place] family is from . Moves all 4's spontaneously.  Marland Kitchen.   Psych:  pleasant  Assessment/Plan: 1. Functional and cognitive deficits secondary to right temporal ICH which require 3+ hours per day of interdisciplinary therapy in a comprehensive inpatient rehab setting. Physiatrist is providing close team supervision and 24 hour management of active medical problems  listed below. Physiatrist and rehab team continue to assess barriers to discharge/monitor patient progress toward functional and medical goals.  Function:  Bathing Bathing position      Bathing parts      Bathing assist        Upper Body Dressing/Undressing Upper body dressing   What is the patient wearing?: Hospital gown                Upper body assist        Lower Body Dressing/Undressing Lower body dressing   What is the patient wearing?: Hospital Gown                              Lower body assist        Toileting Toileting Toileting activity did not occur: No continent bowel/bladder event   Toileting steps completed by helper: Adjust clothing prior to toileting, Performs perineal hygiene, Adjust clothing after toileting    Toileting assist Assist level: Two helpers   Transfers Chair/bed transfer   Chair/bed transfer method: Other Chair/bed transfer assist level: dependent (Pt equals 0%) Chair/bed transfer assistive device: Mechanical lift Mechanical lift: Maxisky   Locomotion Ambulation Ambulation activity did not occur: Safety/medical concerns (fatigue)         Wheelchair Wheelchair activity did not occur: Safety/medical concerns        Cognition Comprehension Comprehension assist level: Understands basic less than 25% of the time/ requires cueing >75% of  the time  Expression Expression assist level: Expresses basis less than 25% of the time/requires cueing >75% of the time.  Social Interaction Social Interaction assist level: Interacts appropriately less than 25% of the time. May be withdrawn or combative.  Problem Solving Problem solving assist level: Solves basic less than 25% of the time - needs direction nearly all the time or does not effectively solve problems and may need a restraint for safety  Memory Memory assist level: Recognizes or recalls less than 25% of the time/requires cueing greater than 75% of the time   Medical  Problem List and Plan: 1. Decreased functional mobilitysecondary to right temporal ICH with IVH status post craniotomy for hematoma evacuation 06/24/2017 -continue therapies  -sleep/wake improving gradually 2. DVT Prophylaxis/Anticoagulation: Subcutaneous heparin for DVT prophylaxis initiated 06/29/2017 3. Pain Management/back pain: Discontinue hydrocodone due to somnolence. Monitor mental status 4. Mood:Amantadine 100 mg twice a day 0700 and 1200 5. Neuropsych: This patient is notcapable of making decisions on hisown behalf. -continue sleep chart -limiting neuro-sedating medications, suspect some of lethargy is from medication provided at night for confusion/restlessness -maintain HS seroquel  -continue day time amantadine  -stimulate during day  -ua/ucx neg so far. VPA level low normal 6. Skin/Wound Care: Routine skin checks 7. Fluids/Electrolytes/Nutrition: encourage PO as possible  -family helping to push po, ensure supps  -bmet in am   -need to record I/O's more accurately--start calorie ct 8.Seizure prophylaxis. Valproate 500 mg every 12 hours. EEG negative  -VPA level 55 on 8/29 9.CAD with cardiac catheterization 03/26/2015. Continue low-dose aspirin. 10.CKD stage III. Follow-up chemistries on admission 11.Hypertension/PAF. Lisinopril 5 mg daily, Lopressor 12.5 mg twice a day. Cardiac rate controlled at present 12.Hyperlipidemia. Lipitor 13.Hypothyroidism. Synthroid. Follow up levels as outpt.    LOS (Days) 3 A FACE TO FACE EVALUATION WAS PERFORMED  [name], MD 07/03/2017 9:49 AM

## 2017-07-03 NOTE — Progress Notes (Signed)
Speech Language Pathology Daily Session Note  Patient Details  Name: Randy Durham MRN: [id number] Date of Birth: May 14, 1934  Today's Date: 07/03/2017 SLP Individual Time: [id number] SLP Individual Time Calculation (min): 45 min and Today's Date: 07/03/2017 SLP Missed Time: 15 Minutes Missed Time Reason: Patient fatigue  Short Term Goals: Week 1: SLP Short Term Goal 1 (Week 1): Pt will demonstrate focused attention to functional task for 30 seconds with Max multimodal cues. SLP Short Term Goal 2 (Week 1): Pt will consistently demonstrate O x 4 with Max multimodal cues. SLP Short Term Goal 3 (Week 1): Pt will answer yes/no questions with 30% accuracy using max multimodal cues. SLP Short Term Goal 4 (Week 1): Pt will communicate basic wants and needs via multimodal communciation with Max A verbal cues. SLP Short Term Goal 5 (Week 1): Pt will initate basic familar ADL tasks in 30% of opportunties with Max multimodal cues. SLP Short Term Goal 6 (Week 1): Patient will consume current diet with minimal overt s/s of aspiration with Mod A verbal cues for use of swallowing compensatory strategies.   Skilled Therapeutic Interventions: Skilled treatment session focused on cognitive goals. Upon arrival, patient was asleep while upright in the wheelchair. SLP facilitated session by providing Max encouragement and interventions to maximize patient's arousal. However, despite multiple attempts but did not open his eyes throughout the session or demonstrate any purposeful behavior. Patient with intermittent language of confusion that was essentially unintelligible throughout session. Patient missed remaining 15 minutes of session due to lethargy. RN aware. Patient left reclined in wheelchair with family present. Continue with current plan of care.       Function:   Cognition Comprehension Comprehension assist level: Understands basic less than 25% of the time/ requires cueing >75% of the time  Expression    Expression assist level: Expresses basis less than 25% of the time/requires cueing >75% of the time.  Social Interaction Social Interaction assist level: Interacts appropriately less than 25% of the time. May be withdrawn or combative.  Problem Solving Problem solving assist level: Solves basic less than 25% of the time - needs direction nearly all the time or does not effectively solve problems and may need a restraint for safety  Memory Memory assist level: Recognizes or recalls less than 25% of the time/requires cueing greater than 75% of the time    Pain No reports of pain   Therapy/Group: Individual Therapy  Linard Daft 07/03/2017, 1:45 PM

## 2017-07-03 NOTE — IPOC Note (Signed)
Overall Plan of Care [facility]) Patient Details Name: Randy Durham MRN: [id number] DOB: 01-15-1934  Admitting Diagnosis: [facility] Problems: Principal Problem:   Hemorrhage of right temporal lobe (HCC) Active Problems:   Chronic kidney disease (CKD), stage III (moderate)   Cytotoxic brain edema (HCC)   Somnolence     Functional Problem List: Nursing Nutrition, Bowel, Endurance, Medication Management, Safety, Bladder  PT Balance, Endurance, Motor, Safety, Pain  OT Balance, Cognition, Endurance, Motor, Perception, Vision, Pain  SLP Behavior  TR         Basic ADL's: OT Eating, Grooming, Bathing, Dressing, Toileting     Advanced  ADL's: OT       Transfers: PT Bed Mobility, Bed to Chair, Car, Oncologist: PT Ambulation, Stairs     Additional Impairments: OT Fuctional Use of Upper Extremity  SLP        TR      Anticipated Outcomes Item Anticipated Outcome  Self Feeding supervision  Swallowing  Min A    Basic self-care  min A  Toileting  min A    Bathroom Transfers min A to BSC  Bowel/Bladder  manage bowel with mod I assist and bladder with min assist  Transfers  min assist   Locomotion  min assist   Communication     Cognition  Mod  Pain     Safety/Judgment  maintain safety with supervision/cues   Therapy Plan: PT Intensity: Minimum of 1-2 x/day ,45 to 90 minutes PT Frequency: 5 out of 7 days PT Duration Estimated Length of Stay: 21-24 days OT Intensity: Minimum of 1-2 x/day, 45 to 90 minutes OT Frequency: 5 out of 7 days OT Duration/Estimated Length of Stay: 20-21 days SLP Intensity: Minumum of 1-2 x/day, 30 to 90 minutes SLP Frequency: 3 to 5 out of 7 days SLP Duration/Estimated Length of Stay: 20-21 days    Team Interventions: Nursing Interventions Patient/Family Education, Discharge Planning, Bladder Management, Cognitive Remediation/Compensation, Psychosocial Support, Bowel Management, Medication Management,  Dysphagia/Aspiration Precaution Training  PT interventions Ambulation/gait training, Community reintegration, DME/adaptive equipment instruction, Neuromuscular re-education, Stair training, UE/LE Strength taining/ROM, Discharge planning, Warden/ranger, Therapeutic Activities, UE/LE Coordination activities, Cognitive remediation/compensation, Functional mobility training, Patient/family education, Therapeutic Exercise  OT Interventions Balance/vestibular training, Cognitive remediation/compensation, Discharge planning, DME/adaptive equipment instruction, Functional mobility training, Neuromuscular re-education, Patient/family education, Self Care/advanced ADL retraining, Psychosocial support, Pain management, Therapeutic Activities, Therapeutic Exercise, UE/LE Strength taining/ROM, UE/LE Coordination activities, Visual/perceptual remediation/compensation  SLP Interventions Dysphagia/aspiration precaution training, Patient/family education, Functional tasks  TR Interventions    SW/CM Interventions Discharge Planning, Psychosocial Support, Patient/Family Education   Barriers to Discharge MD  Medical stability and Incontinence  Nursing      PT Home environment access/layout    OT Inaccessible home environment, Home environment access/layout pt lives in 2nd floor apt, daughter has town home but no bedroom or full bath on first floor  SLP Medical stability    SW       Team Discharge Planning: Destination: PT-Home ,OT- Home , SLP-Home Projected Follow-up: PT-Home health PT, 24 hour supervision/assistance, OT-  Home health OT, SLP-Home Health SLP, 24 hour supervision/assistance Projected Equipment Needs: PT-To be determined, OT- 3 in 1 bedside comode, SLP-None recommended by SLP Equipment Details: PT- , OT-  Patient/family involved in discharge planning: PT- Patient, Family member/caregiver,  OT-Family Adult nurse, Other (comment) (pt unable ), SLP-Patient, Family  member/caregiver  MD ELOS: 21-24 days Medical Rehab Prognosis:  Excellent Assessment: The patient has been  admitted for CIR therapies with the diagnosis of ICH. The team will be addressing functional mobility, strength, stamina, balance, safety, adaptive techniques and equipment, self-care, bowel and bladder mgt, patient and caregiver education, swallowing, nutrition, cognition, communication, normalization of day/nights. Goals have been set at min assist for basic mobility and self-care and mod assist for cognition.    Ranelle OysterZachary T. Swartz, MD, FAAPMR      See Team Conference Notes for weekly updates to the plan of care

## 2017-07-04 ENCOUNTER — Inpatient Hospital Stay (HOSPITAL_COMMUNITY): Payer: Medicare Other | Admitting: Occupational Therapy

## 2017-07-04 ENCOUNTER — Inpatient Hospital Stay (HOSPITAL_COMMUNITY): Payer: Medicare Other | Admitting: Physical Therapy

## 2017-07-04 ENCOUNTER — Inpatient Hospital Stay (HOSPITAL_COMMUNITY): Payer: Medicare Other | Admitting: Speech Pathology

## 2017-07-04 DIAGNOSIS — I10 Essential (primary) hypertension: Secondary | ICD-10-CM

## 2017-07-04 DIAGNOSIS — N179 Acute kidney failure, unspecified: Secondary | ICD-10-CM

## 2017-07-04 LAB — BASIC METABOLIC PANEL
Anion gap: 10 (ref 5–15)
BUN: 16 mg/dL (ref 6–20)
CO2: 27 mmol/L (ref 22–32)
Calcium: 9.2 mg/dL (ref 8.9–10.3)
Chloride: 102 mmol/L (ref 101–111)
Creatinine, Ser: 1.07 mg/dL (ref 0.61–1.24)
GFR calc Af Amer: >60 mL/min (ref >60)
GFR calc non Af Amer: >60 mL/min (ref >60)
Glucose, Bld: 101 mg/dL — ABNORMAL HIGH (ref 65–99)
Potassium: 4 mmol/L (ref 3.5–5.1)
Sodium: 139 mmol/L (ref 135–145)

## 2017-07-04 MED ORDER — AMLODIPINE BESYLATE 2.5 MG PO TABS
2.5000 mg | ORAL_TABLET | Freq: Every day | ORAL | Status: DC
  Administered 2017-07-04 – 2017-07-22 (×19): 2.5 mg via ORAL
  Filled 2017-07-04 (×19): qty 1

## 2017-07-04 MED ORDER — QUETIAPINE FUMARATE 25 MG PO TABS
12.5000 mg | ORAL_TABLET | Freq: Every evening | ORAL | Status: DC | PRN
  Administered 2017-07-04 – 2017-07-05 (×2): 12.5 mg via ORAL
  Filled 2017-07-04 (×2): qty 1

## 2017-07-04 NOTE — Progress Notes (Signed)
[place] PHYSICAL MEDICINE & REHABILITATION     PROGRESS NOTE    Subjective/Complaints: Patient seen lying bed this morning. He slept well overnight for sleep chart. Son at bedside who notes patient was lethargic all day yesterday.  ROS: Unable to obtain due to lethargy  Objective: Vital Signs: Blood pressure (!) 145/48, pulse 61, temperature 98.6 F (37 C), temperature source Oral, resp. rate (!) 22, height 5\' 3"  (1.6 m), weight 80.7 kg (178 lb), SpO2 99 %. No results found. No results for input(s): WBC, HGB, HCT, PLT in the last 72 hours.  Recent Labs  07/04/17 0848  NA 139  K 4.0  CL 102  GLUCOSE 101*  BUN 16  CREATININE 1.07  CALCIUM 9.2   CBG (last 3)  No results for input(s): GLUCAP in the last 72 hours.  Wt Readings from Last 3 Encounters:  07/04/17 80.7 kg (178 lb)  06/30/17 81.6 kg (180 lb)  03/25/17 82.1 kg (181 lb)    Physical Exam:  Constitutional: No distress. Vital signs reviewed HENT: Normocephalic.  Eyes: Does not open eyes Cardiovascular: RRR. No JVD   Respiratory: CTA Bilaterally. Normal effort   GI: Bowel sounds are normal. He exhibits no distension. Skin: Skin is warm and dry. Craniotomy site clean and dry Neurological: Lethargic Unable to arouse 4 MMT or sensation testing   Psych:  Unable to assess due to mentation  Assessment/Plan: 1. Functional and cognitive deficits secondary to right temporal ICH which require 3+ hours per day of interdisciplinary therapy in a comprehensive inpatient rehab setting. Physiatrist is providing close team supervision and 24 hour management of active medical problems listed below. Physiatrist and rehab team continue to assess barriers to discharge/monitor patient progress toward functional and medical goals.  Function:  Bathing Bathing position   Position: Bed  Bathing parts   Body parts bathed by helper: Right arm, Left arm, Chest, Abdomen, Front perineal area, Buttocks, Right upper leg, Left upper  leg, Right lower leg, Left lower leg, Back  Bathing assist Assist Level: 2 helpers      Upper Body Dressing/Undressing Upper body dressing   What is the patient wearing?: Hospital gown                Upper body assist Assist Level:  (Dependent)      Lower Body Dressing/Undressing Lower body dressing   What is the patient wearing?: Non-skid slipper socks, Ted Hose           Non-skid slipper socks- Performed by helper: Don/doff right sock, Don/doff left sock               TED Hose - Performed by helper: Don/doff right TED hose, Don/doff left TED hose  Lower body assist Assist for lower body dressing: 2 Helpers      Toileting Toileting Toileting activity did not occur: No continent bowel/bladder event   Toileting steps completed by helper: Adjust clothing prior to toileting, Performs perineal hygiene, Adjust clothing after toileting    Toileting assist Assist level: Two helpers   Transfers Chair/bed transfer   Chair/bed transfer method: Other Chair/bed transfer assist level: dependent (Pt equals 0%) Chair/bed transfer assistive device: Mechanical lift Mechanical lift: Maxisky   Locomotion Ambulation Ambulation activity did not occur: Safety/medical concerns (fatigue)         Wheelchair Wheelchair activity did not occur: Safety/medical concerns        Cognition Comprehension Comprehension assist level: Understands basic 25 - 49% of the time/ requires cueing 50 -  75% of the time  Expression Expression assist level: Expresses basis less than 25% of the time/requires cueing >75% of the time.  Social Interaction Social Interaction assist level: Interacts appropriately less than 25% of the time. May be withdrawn or combative.  Problem Solving Problem solving assist level: Solves basic less than 25% of the time - needs direction nearly all the time or does not effectively solve problems and may need a restraint for safety  Memory Memory assist level: Recognizes  or recalls less than 25% of the time/requires cueing greater than 75% of the time   Medical Problem List and Plan: 1. Decreased functional mobilitysecondary to right temporal ICH with IVH status post craniotomy for hematoma evacuation 06/24/2017 -continue therapies  Notes reviewed, images reviewed 2. DVT Prophylaxis/Anticoagulation: Subcutaneous heparin for DVT prophylaxis initiated 06/29/2017 3. Pain Management/back pain: Discontinue hydrocodone due to somnolence.   Monitor mental status 4. Mood:Amantadine 100 mg twice a day 0700 and 1200 5. Neuropsych: This patient is notcapable of making decisions on hisown behalf. -continue sleep chart -limiting neuro-sedating medications, suspect some of lethargy is from medication provided at night for confusion/restlessness  Reduced Seroquel, with plan to DC  -continue day time amantadine  -stimulate during day  -ua/ucx with multiple species. 6. Skin/Wound Care: Routine skin checks 7. Fluids/Electrolytes/Nutrition: encourage PO as possible  -bmet within acceptable range on 9/1   -need to record I/O's more accurately--start calorie ct  -AKI, Bun/Cr trending up, encourage fluids 8.Seizure prophylaxis. Valproate 500 mg every 12 hours. EEG negative  -VPA level 55 on 8/29 9.CAD with cardiac catheterization 03/26/2015. Continue low-dose aspirin. 10.Hypertension/PAF. Lisinopril 5 mg daily, Lopressor 12.5 mg twice a day.   Norvasc 2.5 started on 9/1 to limit renal effects 11.Hyperlipidemia. Lipitor 12.Hypothyroidism. Synthroid. Follow up levels as outpt.    LOS (Days) 4 A FACE TO FACE EVALUATION WAS PERFORMED  Aubert Choyce Karis JubaAnil Dick Hark, MD 07/04/2017 10:29 AM

## 2017-07-04 NOTE — Progress Notes (Signed)
Occupational Therapy Session Note  Patient Details  Name: Randy Durham MRN: [id number] Date of Birth: [birth year]  Today's Date: 07/04/2017 OT Individual Time: [id number] and [id number] OT Individual Time Calculation (min): 56 min and 48 min    Short Term Goals: Week 1:  OT Short Term Goal 1 (Week 1): Pt will maintain attention and alertness for 10 min to participate in bathing. OT Short Term Goal 2 (Week 1): Pt will be able to bring a cup to his mouth with min A. OT Short Term Goal 3 (Week 1): Pt will be able to bathe UB with mod-max A.  OT Short Term Goal 4 (Week 1): Pt will be able to sit unsupported for 10 min with mod A to progress to sitting on a BSC.  Skilled Therapeutic Interventions/Progress Updates:   Session 1: Upon entering the room, pt supine in bed sleeping with son present. Pt with no c/o pain this session. Pt continues to be very lethargic throughout this session. Supine >sit with total A to EOB. Pt seated on EOB for 20 minutes with mod - max A while therapist assisted him with washing his hair. Pt occasionally speaking incoherently but not opening eyes throughout session. Pt returned to supine with +2 assistance. Pt repositioned onto R side with total A. Call bell and all needed items within reach.   Session 2:  Upon entering the room, pt seated in wheelchair with son present in room. Therapist propelling wheelchair outside in order to attempt to alert pt. He did not open eyes but states, " feels nice". Pt incontinent in brief and returned to room. Maxi sky utilized to transfer pt to bed for hygiene. Clothing management and hygiene performed with +2 assistance. Pt rolling L <> R with +2 assistance as well. Pt remained supine at end of session with all needs within reach.   Therapy Documentation Precautions:  Precautions Precautions: Fall Restrictions Weight Bearing Restrictions: No General:   Vital Signs:  Pain: Pain Assessment Pain Assessment: Faces Pain Score: 0-No  pain Faces Pain Scale: No hurt ADL: ADL ADL Comments: refer to functional navigator  See Function Navigator for Current Functional Status.   Therapy/Group: Individual Therapy  Alen BleacherBradsher, Barbaraann Avans P 07/04/2017, 8:57 AM

## 2017-07-04 NOTE — Progress Notes (Signed)
Speech Language Pathology Daily Session Note  Patient Details  Name: Randy Durham MRN: [id number] Date of Birth: 01/05/1934  Today's Date: 07/04/2017 SLP Individual Time: 1345-1415 SLP Individual Time Calculation (min): 30 min  Short Term Goals: Week 1: SLP Short Term Goal 1 (Week 1): Pt will demonstrate focused attention to functional task for 30 seconds with Max multimodal cues. SLP Short Term Goal 2 (Week 1): Pt will consistently demonstrate O x 4 with Max multimodal cues. SLP Short Term Goal 3 (Week 1): Pt will answer yes/no questions with 30% accuracy using max multimodal cues. SLP Short Term Goal 4 (Week 1): Pt will communicate basic wants and needs via multimodal communciation with Max A verbal cues. SLP Short Term Goal 5 (Week 1): Pt will initate basic familar ADL tasks in 30% of opportunties with Max multimodal cues. SLP Short Term Goal 6 (Week 1): Patient will consume current diet with minimal overt s/s of aspiration with Mod A verbal cues for use of swallowing compensatory strategies.   Skilled Therapeutic Interventions: Skilled treatment session focused on dysphagia and cognition goals. When SLP arrived to room, pt was sitting upright in wheelchair with his eyes closed. Although he didn't open his eyes, he perceived SLP speaking to him and spoke (intelligible words but conveyed no meaning). Pt never opened his eyes throughout session by perceived straw at lips and consumed thin liquids via straw without overt s/s of aspiration. Pt also perceived spoon at mouth and consumed puree textures with complete oral clearing and no overt s/s of aspiration. With dysphagia 2 textures, pt demonstrated extended mastication for excessive of greater than 5 minutes. SLP performed finger sweep x 4 to clear oral cavity of bolus. Despite Max A/Total A cues, pt unable to follow 1 step directions to open mouth or to spit. SLP downgraded diet to dysphagia 1 with thin liquids. Son present during session and  education provided. Pt left upright in wheelchair with all needs within reach and son present. Continue per current plan of care.       Function:  Eating Eating   Modified Consistency Diet: Yes Eating Assist Level: Helper feeds patient   Eating Set Up Assist For: Opening containers Helper Scoops Food on Utensil: Every scoop     Cognition Comprehension Comprehension assist level: Understands basic less than 25% of the time/ requires cueing >75% of the time  Expression   Expression assist level: Expresses basis less than 25% of the time/requires cueing >75% of the time.  Social Interaction Social Interaction assist level: Interacts appropriately less than 25% of the time. May be withdrawn or combative.  Problem Solving Problem solving assist level: Solves basic less than 25% of the time - needs direction nearly all the time or does not effectively solve problems and may need a restraint for safety  Memory Memory assist level: Recognizes or recalls less than 25% of the time/requires cueing greater than 75% of the time    Pain Pain Assessment Pain Score: Asleep  Therapy/Group: Individual Therapy  Donyale Berthold 07/04/2017, 3:39 PM

## 2017-07-04 NOTE — Progress Notes (Signed)
Physical Therapy Session Note  Patient Details  Name: Randy Durham MRN: [id number] Date of Birth: [birth year]  Today's Date: 07/04/2017 PT Individual Time: [id number] PT Individual Time Calculation (min): 30 min   Short Term Goals: Week 1:  PT Short Term Goal 1 (Week 1): Pt will roll with mod assist and min cues  PT Short Term Goal 2 (Week 1): Pt will maintain static sitting balance with min assist  PT Short Term Goal 3 (Week 1): Pt will supine<>sit with mod assist   Skilled Therapeutic Interventions/Progress Updates:  Pt received in room with son [name] present. Pt holding head and grimacing as if in pain but RN reports pt isn't due for pain medication until noon. Pt lying with eyes closed and unable to open eyes on command or follow instructions.  Pt noted to be incontinent of BM & urine. Pt rolled L<>R dependently to allow therapist and rehab tech to perform peri hygiene, don clean brief, and for sling placement total assist. Pt transferred bed>TIS w/c via MaxiSky for safety 2/2 pt's lethargy. Once in chair pt was dependently propelled around unit in attempt to increase arousal. Pt would randomly state "Hey" but unable to engage further, despite verbal and tactile stimulation. RN aware of pt's lethargy. Pt left in TIS w/c with seat belt donned & son present to supervise.   Therapy Documentation Precautions:  Precautions Precautions: Fall Restrictions Weight Bearing Restrictions: No   General: PT Amount of Missed Time (min): 30 Minutes PT Missed Treatment Reason: Patient fatigue (lethargic, unable to arouse)   See Function Navigator for Current Functional Status.   Therapy/Group: Individual Therapy  Sandi MariscalVictoria M Kaniel Kiang 07/04/2017, 12:00 PM

## 2017-07-04 NOTE — Plan of Care (Signed)
Problem: RH BOWEL ELIMINATION Goal: RH STG MANAGE BOWEL WITH ASSISTANCE STG Manage Bowel with max Assistance.    Outcome: Not Progressing Pt incontinent of stool.  Problem: RH BLADDER ELIMINATION Goal: RH STG MANAGE BLADDER WITH ASSISTANCE STG Manage Bladder With min Assistance   Outcome: Not Progressing Pt incontinent of urine that requires changing of brief and bed pad.

## 2017-07-05 ENCOUNTER — Inpatient Hospital Stay (HOSPITAL_COMMUNITY): Payer: Medicare Other

## 2017-07-05 DIAGNOSIS — G441 Vascular headache, not elsewhere classified: Secondary | ICD-10-CM

## 2017-07-05 DIAGNOSIS — R5383 Other fatigue: Secondary | ICD-10-CM

## 2017-07-05 MED ORDER — TOPIRAMATE 25 MG PO TABS
25.0000 mg | ORAL_TABLET | Freq: Every day | ORAL | Status: DC
  Administered 2017-07-05 – 2017-07-06 (×2): 25 mg via ORAL
  Filled 2017-07-05 (×2): qty 1

## 2017-07-05 NOTE — Progress Notes (Signed)
Occupational Therapy Session Note  Patient Details  Name: Karlton Lemonrimo Galbreath MRN: [id number] Date of Birth: 1934-10-22  Today's Date: 07/05/2017 OT Individual Time: 1500-1530 OT Individual Time Calculation (min): 30 min    Short Term Goals: Week 1:  OT Short Term Goal 1 (Week 1): Pt will maintain attention and alertness for 10 min to participate in bathing. OT Short Term Goal 2 (Week 1): Pt will be able to bring a cup to his mouth with min A. OT Short Term Goal 3 (Week 1): Pt will be able to bathe UB with mod-max A.  OT Short Term Goal 4 (Week 1): Pt will be able to sit unsupported for 10 min with mod A to progress to sitting on a BSC.  Skilled Therapeutic Interventions/Progress Updates:    1;1. Family present During session. Pt with eyes intermittantly open during first 15 min. OT completes HOH A to thread B sleeves/head into shirt. OT provides total A supine>sitting with initally MAX A for sitting balance fading to CGA. Pt listens to neil diamond music while sitting EOB, and "dances with therast" keeping eyes open ~7 min EOB. After 7 min, pt beginning to fatigue closing eyes and leaning forward. OT provides total A to transition back to supine and position in bed. Exited session with pt supine in bed with NT in room to change sheets.   Therapy Documentation Precautions:  Precautions Precautions: Fall Restrictions Weight Bearing Restrictions: No  See Function Navigator for Current Functional Status.   Therapy/Group: Individual Therapy  Shon HaleStephanie M Yanuel Tagg 07/05/2017, 5:10 PM

## 2017-07-05 NOTE — Progress Notes (Signed)
[place] PHYSICAL MEDICINE & REHABILITATION     PROGRESS NOTE    Subjective/Complaints: Patient seen lying in bed this morning. Per family, he slept well and was more alert yesterday. He continues to have limited meaningful interaction.   ROS: Unable to obtain due to cognition.  Objective: Vital Signs: Blood pressure (!) 156/76, pulse 62, temperature 98.4 F (36.9 C), temperature source Axillary, resp. rate 18, height 5\' 3"  (1.6 m), weight 80.7 kg (178 lb), SpO2 98 %. No results found. No results for input(s): WBC, HGB, HCT, PLT in the last 72 hours.  Recent Labs  07/04/17 0848  NA 139  K 4.0  CL 102  GLUCOSE 101*  BUN 16  CREATININE 1.07  CALCIUM 9.2   CBG (last 3)  No results for input(s): GLUCAP in the last 72 hours.  Wt Readings from Last 3 Encounters:  07/05/17 80.7 kg (178 lb)  06/30/17 81.6 kg (180 lb)  03/25/17 82.1 kg (181 lb)    Physical Exam:  Constitutional: No distress. Vital signs reviewed HENT: Normocephalic.  Eyes: Does not open eyes Cardiovascular: RRR. No JVD   Respiratory: CTA Bilaterally. Normal effort   GI: Bowel sounds are normal. He exhibits no distension. Skin: Skin is warm and dry. Craniotomy site clean and dry Neurological: Less lethargic Unable to participation in MMT or sensation testing   Psych:  Unable to assess due to mentation  Assessment/Plan: 1. Functional and cognitive deficits secondary to right temporal ICH which require 3+ hours per day of interdisciplinary therapy in a comprehensive inpatient rehab setting. Physiatrist is providing close team supervision and 24 hour management of active medical problems listed below. Physiatrist and rehab team continue to assess barriers to discharge/monitor patient progress toward functional and medical goals.  Function:  Bathing Bathing position   Position: Bed  Bathing parts   Body parts bathed by helper: Right arm, Left arm, Chest, Abdomen, Front perineal area, Buttocks, Right  upper leg, Left upper leg, Right lower leg, Left lower leg, Back  Bathing assist Assist Level: 2 helpers      Upper Body Dressing/Undressing Upper body dressing   What is the patient wearing?: Hospital gown                Upper body assist Assist Level:  (Dependent)      Lower Body Dressing/Undressing Lower body dressing   What is the patient wearing?: Non-skid slipper socks, Ted Hose           Non-skid slipper socks- Performed by helper: Don/doff right sock, Don/doff left sock               TED Hose - Performed by helper: Don/doff right TED hose, Don/doff left TED hose  Lower body assist Assist for lower body dressing: 2 Helpers      Toileting Toileting Toileting activity did not occur: No continent bowel/bladder event   Toileting steps completed by helper: Adjust clothing prior to toileting, Performs perineal hygiene, Adjust clothing after toileting    Toileting assist Assist level: Two helpers   Transfers Chair/bed transfer   Chair/bed transfer method: Other Chair/bed transfer assist level: 2 helpers Chair/bed transfer assistive device: Systems developer lift: Air traffic controller Ambulation activity did not occur: Safety/medical concerns (fatigue)         Wheelchair Wheelchair activity did not occur: Safety/medical concerns     Assist Level: Dependent (Pt equals 0%)  Cognition Comprehension Comprehension assist level: Understands basic 25 - 49% of the time/  requires cueing 50 - 75% of the time  Expression Expression assist level: Expresses basis less than 25% of the time/requires cueing >75% of the time.  Social Interaction Social Interaction assist level: Interacts appropriately less than 25% of the time. May be withdrawn or combative.  Problem Solving Problem solving assist level: Solves basic less than 25% of the time - needs direction nearly all the time or does not effectively solve problems and may need a restraint for  safety  Memory Memory assist level: Recognizes or recalls less than 25% of the time/requires cueing greater than 75% of the time   Medical Problem List and Plan: 1. Decreased functional mobilitysecondary to right temporal ICH with IVH status post craniotomy for hematoma evacuation 06/24/2017 -continue therapies 2. DVT Prophylaxis/Anticoagulation: Subcutaneous heparin for DVT prophylaxis initiated 06/29/2017 3. Pain Management/back pain: Discontinue hydrocodone due to somnolence.   Monitor mental status  Topamax started 9/2 4. Mood:Amantadine 100 mg twice a day 0700 and 1200 5. Neuropsych: This patient is notcapable of making decisions on hisown behalf. -continue sleep chart -limiting neuro-sedating medications, suspect some of lethargy is from medication provided at night for confusion/restlessness  Reduced Seroquel  -continue day time amantadine  -stimulate during day  -ua/ucx with multiple species. 6. Skin/Wound Care: Routine skin checks 7. Fluids/Electrolytes/Nutrition: encourage PO as possible  -bmet within acceptable range on 9/1   -need to record I/O's more accurately--start calorie ct  -AKI, Bun/Cr trending up, encourage fluids 8.Seizure prophylaxis. Valproate 500 mg every 12 hours. EEG negative  -VPA level 55 on 8/29 9.CAD with cardiac catheterization 03/26/2015. Continue low-dose aspirin. 10.Hypertension/PAF. Lisinopril 5 mg daily, Lopressor 12.5 mg twice a day.   Norvasc 2.5 started on 9/1 to limit renal effects  Cont to monitor 11.Hyperlipidemia. Lipitor 12.Hypothyroidism. Synthroid. Follow up levels as outpt.    LOS (Days) 5 A FACE TO FACE EVALUATION WAS PERFORMED  Ankit Karis JubaAnil Patel, MD 07/05/2017 8:03 AM

## 2017-07-06 ENCOUNTER — Inpatient Hospital Stay (HOSPITAL_COMMUNITY): Payer: Medicare Other | Admitting: Physical Therapy

## 2017-07-06 ENCOUNTER — Inpatient Hospital Stay (HOSPITAL_COMMUNITY): Payer: Medicare Other | Admitting: Occupational Therapy

## 2017-07-06 ENCOUNTER — Inpatient Hospital Stay (HOSPITAL_COMMUNITY): Payer: Medicare Other | Admitting: Speech Pathology

## 2017-07-06 MED ORDER — TOPIRAMATE 25 MG PO TABS
25.0000 mg | ORAL_TABLET | Freq: Every day | ORAL | Status: DC
  Administered 2017-07-06 – 2017-07-21 (×16): 25 mg via ORAL
  Filled 2017-07-06 (×16): qty 1

## 2017-07-06 MED ORDER — TAMSULOSIN HCL 0.4 MG PO CAPS: 0.4000 mg | ORAL_CAPSULE | Freq: Every day | ORAL | Status: DC

## 2017-07-06 MED ORDER — METHYLPHENIDATE HCL 5 MG PO TABS
5.0000 mg | ORAL_TABLET | Freq: Two times a day (BID) | ORAL | Status: DC
  Administered 2017-07-06 – 2017-07-13 (×15): 5 mg via ORAL
  Filled 2017-07-06 (×15): qty 1

## 2017-07-06 NOTE — Progress Notes (Signed)
Speech Language Pathology Daily Session Note  Patient Details  Name: Randy Durham MRN: [id number] Date of Birth: 1934-04-11  Today's Date: 07/06/2017 SLP Individual Time: 1115-1200 SLP Individual Time Calculation (min): 45 min  Short Term Goals: Week 1: SLP Short Term Goal 1 (Week 1): Pt will demonstrate focused attention to functional task for 30 seconds with Max multimodal cues. SLP Short Term Goal 2 (Week 1): Pt will consistently demonstrate O x 4 with Max multimodal cues. SLP Short Term Goal 3 (Week 1): Pt will answer yes/no questions with 30% accuracy using max multimodal cues. SLP Short Term Goal 4 (Week 1): Pt will communicate basic wants and needs via multimodal communciation with Max A verbal cues. SLP Short Term Goal 5 (Week 1): Pt will initate basic familar ADL tasks in 30% of opportunties with Max multimodal cues. SLP Short Term Goal 6 (Week 1): Patient will consume current diet with minimal overt s/s of aspiration with Mod A verbal cues for use of swallowing compensatory strategies.   Skilled Therapeutic Interventions: Skilled treatment session focused on cognitive and dysphagia goals. SLP facilitated session by providing Max A verbal and tactile cues for arousal and alertness. Patient initially opened his eyes for ~5 seconds and was unable to maintain arousal and was also essentially nonverbal throughout session . However, patient's arousal improved during the last 10 minutes of the session and patient consumed minimal amounts of thin liquids via straw and Dys. 1 textures without overt s/s of aspiration but with prolonged AP transit. Recommend patient continue current diet. Patient left upright in bed with family present. Continue with current plan of care.      Function:  Eating Eating   Modified Consistency Diet: Yes Eating Assist Level: Helper feeds patient   Eating Set Up Assist For: Opening containers;Cutting food;Applying device (includes dentures)        Cognition Comprehension Comprehension assist level: Understands basic less than 25% of the time/ requires cueing >75% of the time  Expression   Expression assist level: Expresses basis less than 25% of the time/requires cueing >75% of the time.  Social Interaction Social Interaction assist level: Interacts appropriately 25 - 49% of time - Needs frequent redirection.  Problem Solving Problem solving assist level: Solves basic less than 25% of the time - needs direction nearly all the time or does not effectively solve problems and may need a restraint for safety  Memory Memory assist level: Recognizes or recalls less than 25% of the time/requires cueing greater than 75% of the time    Pain No/Denies Pain   Therapy/Group: Individual Therapy  Vella Colquitt 07/06/2017, 12:40 PM

## 2017-07-06 NOTE — Progress Notes (Signed)
[place] PHYSICAL MEDICINE & REHABILITATION     PROGRESS NOTE    Subjective/Complaints: Fair night. A little more awake today, but arousal comes and goes. Son notes he's usually more awake late in the afternoon.   ROS: Limited due to cognitive/behavioral   Objective: Vital Signs: Blood pressure (!) 155/66, pulse 60, temperature 98 F (36.7 C), temperature source Oral, resp. rate 18, height 5\' 3"  (1.6 m), weight 84.4 kg (186 lb), SpO2 96 %. No results found. No results for input(s): WBC, HGB, HCT, PLT in the last 72 hours.  Recent Labs  07/04/17 0848  NA 139  K 4.0  CL 102  GLUCOSE 101*  BUN 16  CREATININE 1.07  CALCIUM 9.2   CBG (last 3)  No results for input(s): GLUCAP in the last 72 hours.  Wt Readings from Last 3 Encounters:  07/06/17 84.4 kg (186 lb)  06/30/17 81.6 kg (180 lb)  03/25/17 82.1 kg (181 lb)    Physical Exam:  Constitutional: No distress. Vital signs reviewed HENT: Normocephalic.  Eyes: Does open eyes Cardiovascular: RRR without murmur. No JVD    Respiratory: CTA Bilaterally. Normal effort   GI: Bowel sounds are normal. He exhibits no distension. Skin: Skin is warm and dry. Craniotomy site clean and dry Neurological: arousal comes and goes Unable to participation in MMT or sensation testing   Psych:  pleasant  Assessment/Plan: 1. Functional and cognitive deficits secondary to right temporal ICH which require 3+ hours per day of interdisciplinary therapy in a comprehensive inpatient rehab setting. Physiatrist is providing close team supervision and 24 hour management of active medical problems listed below. Physiatrist and rehab team continue to assess barriers to discharge/monitor patient progress toward functional and medical goals.  Function:  Bathing Bathing position   Position: Bed  Bathing parts   Body parts bathed by helper: Right arm, Left arm, Chest, Abdomen, Front perineal area, Buttocks, Right upper leg, Left upper leg, Right  lower leg, Left lower leg, Back  Bathing assist Assist Level: 2 helpers      Upper Body Dressing/Undressing Upper body dressing   What is the patient wearing?: Hospital gown                Upper body assist Assist Level:  (Dependent)      Lower Body Dressing/Undressing Lower body dressing   What is the patient wearing?: Non-skid slipper socks, Ted Hose           Non-skid slipper socks- Performed by helper: Don/doff right sock, Don/doff left sock               TED Hose - Performed by helper: Don/doff right TED hose, Don/doff left TED hose  Lower body assist Assist for lower body dressing: 2 Helpers      Toileting Toileting Toileting activity did not occur: No continent bowel/bladder event   Toileting steps completed by helper: Adjust clothing prior to toileting, Performs perineal hygiene, Adjust clothing after toileting    Toileting assist Assist level: Two helpers   Transfers Chair/bed transfer   Chair/bed transfer method: Other Chair/bed transfer assist level: 2 helpers Chair/bed transfer assistive device: Systems developerMechanical lift Mechanical lift: Air traffic controllerMaxisky   Locomotion Ambulation Ambulation activity did not occur: Safety/medical concerns (fatigue)         Wheelchair Wheelchair activity did not occur: Safety/medical concerns     Assist Level: Dependent (Pt equals 0%)  Cognition Comprehension Comprehension assist level: Understands basic less than 25% of the time/ requires cueing >75% of  the time  Expression Expression assist level: Expresses basis less than 25% of the time/requires cueing >75% of the time.  Social Interaction Social Interaction assist level: Interacts appropriately 25 - 49% of time - Needs frequent redirection.  Problem Solving Problem solving assist level: Solves basic less than 25% of the time - needs direction nearly all the time or does not effectively solve problems and may need a restraint for safety  Memory Memory assist level:  Recognizes or recalls less than 25% of the time/requires cueing greater than 75% of the time   Medical Problem List and Plan: 1. Decreased functional mobilitysecondary to right temporal ICH with IVH status post craniotomy for hematoma evacuation 06/24/2017 -continue therapies 2. DVT Prophylaxis/Anticoagulation: Subcutaneous heparin for DVT prophylaxis initiated 06/29/2017 3. Pain Management/back pain: Discontinue hydrocodone due to somnolence.   Monitor mental status  Topamax started 9/2---change to HS 4. Mood:dc amantadine---change to ritalin beginning at noon today 5. Neuropsych: This patient is notcapable of making decisions on hisown behalf. -continue sleep chart -limiting neuro-sedating medications. dc Seroquel  -expect slow course cognitively given cerebral edema however  -stimulate during day  -ua/ucx with multiple species. 6. Skin/Wound Care: Routine skin checks 7. Fluids/Electrolytes/Nutrition: encourage PO as possible  -bmet within acceptable range on 9/1   -cal ct  -AKI, Bun/Cr trending up, encourage fluids   -recheck labs tomorrow 8.Seizure prophylaxis. Valproate 500 mg every 12 hours. EEG negative  -VPA level 55 on 8/29 9.CAD with cardiac catheterization 03/26/2015. Continue low-dose aspirin. 10.Hypertension/PAF. Lisinopril 5 mg daily, Lopressor 12.5 mg twice a day.   Norvasc 2.5 started on 9/1 to limit renal effects  Cont to monitor 11.Hyperlipidemia. Lipitor 12.Hypothyroidism. Synthroid. Follow up levels as outpt.    LOS (Days) 6 A FACE TO FACE EVALUATION WAS PERFORMED  [name], MD 07/06/2017 11:50 AM

## 2017-07-06 NOTE — Progress Notes (Signed)
Physical Therapy Session Note  Patient Details  Name: Randy Durham MRN: [id number] Date of Birth: 01/30/1934  Today's Date: 07/06/2017 PT Individual Time: [id number] and 1445-1530 PT Individual Time Calculation (min): 45 min and 45 min   Short Term Goals: Week 1:  PT Short Term Goal 1 (Week 1): Pt will roll with mod assist and min cues  PT Short Term Goal 2 (Week 1): Pt will maintain static sitting balance with min assist  PT Short Term Goal 3 (Week 1): Pt will supine<>sit with mod assist   Skilled Therapeutic Interventions/Progress Updates:  Treatment 1: Pt received in bed with son present. Pt with eyes open on this date but still requiring dependent assist for rolling L<>R for sling placement. Pt transfers bed>TIS w/c with maxisky for safety. Transported pt to dayroom dependently in w/c. Attempted to feed pt breakfast with dependent assist but pt only able to open mouth to initiate taking a bite 50% of the time. Pt unable to focus eyes on therapist nor follow one step commands to locate therapist or raise arms. Pt with significant lean to R while sitting in chair requiring dependent assist for repositioning. Pt also pushing at times in w/c but unable to verbalize needs/wants (pt without any purposeful verbalization during session). Attempted to use music to engage pt but with no success. Pt returned to room & left in TIS w/c with son present to supervise. Pt missed 15 minutes of skilled PT treatment 2/2 fatigue, inability to purposefully participate. PA (Pam) made aware of pt's current status and limitations 2/2 lethargy.   Treatment 2: Pt received in bed with son [name] & wife present to observe session. Pt with eyes open on this date but unable to follow commands. Provided Renae FicklePaul with BLE stretching HEP sheet and therapist provided demonstration with son return demonstrating stretches. Educated him on daily regimen & frequency for stretching with him verbalizing comfort with tasks. Pt noted to be  incontinent of urine & BM & performed rolling L<>R dependently to allow therapist to perform peri hygiene and don clean brief total assist. Pt required total assist and HOH assist to don gown. Pt transferred supine<>sitting EOB with +2 assist as pt with absent initiation or participation in movement. Pt tolerated sitting on EOB ~10 minutes with CGA assist while therapist provided [facility] for reaching for, obtaining, and releasing cones. Pt unable to participate in movement or complete sequence independently. Attempted to have pt wash his face with washcloth for engagement in purposeful activity but pt required total assist for this as well. Pt returned to bed in same manner as noted above. Pt left in bed with alarm set, 4 rails up, and family present to supervise.   Therapy Documentation Precautions:  Precautions Precautions: Fall Restrictions Weight Bearing Restrictions: No   General: PT Amount of Missed Time (min): 15 Minutes PT Missed Treatment Reason: Patient fatigue   See Function Navigator for Current Functional Status.   Therapy/Group: Individual Therapy  Sandi MariscalVictoria M Shamela Haydon 07/06/2017, 3:41 PM

## 2017-07-06 NOTE — Progress Notes (Signed)
Occupational Therapy Session Note  Patient Details  Name: Randy Durham MRN: [id number] Date of Birth: 1934-03-23  Today's Date: 07/06/2017 OT Individual Time: 1000-1100 OT Individual Time Calculation (min): 60 min    Short Term Goals: Week 1:  OT Short Term Goal 1 (Week 1): Pt will maintain attention and alertness for 10 min to participate in bathing. OT Short Term Goal 2 (Week 1): Pt will be able to bring a cup to his mouth with min A. OT Short Term Goal 3 (Week 1): Pt will be able to bathe UB with mod-max A.  OT Short Term Goal 4 (Week 1): Pt will be able to sit unsupported for 10 min with mod A to progress to sitting on a BSC.  Skilled Therapeutic Interventions/Progress Updates:    Upon entering the room, pt seated in wheelchair with son present in room. Pt continues to be lethargic this session. Pt begins pulling at brief and has been incontinent of urine and BM. OT utilized maxi sky with +2 assistance to place sling and transfer to bed safely from wheelchair. Pt rolling L <> R with total A for hygiene this session. Pt opening eyes only briefly and not answering any questions this session. Caregiver requesting education regarding PROM/AROM with B LE's and therapist demonstrated with caregiver returning demonstrations. Paper handout to be given at next session secondary to time. Pt remained supine at this time and repositioned for comfort. Call bell and all needed items within reach.   Therapy Documentation Precautions:  Precautions Precautions: Fall Restrictions Weight Bearing Restrictions: No General:   Vital Signs: Therapy Vitals BP: (!) 155/66 (RN notified) Pain:   ADL: ADL ADL Comments: refer to functional navigator Vision   Perception    Praxis   Exercises:   Other Treatments:    See Function Navigator for Current Functional Status.   Therapy/Group: Individual Therapy  Alen BleacherBradsher, Desmon Hitchner P 07/06/2017, 12:52 PM

## 2017-07-07 ENCOUNTER — Inpatient Hospital Stay (HOSPITAL_COMMUNITY): Payer: Medicare Other | Admitting: Occupational Therapy

## 2017-07-07 ENCOUNTER — Inpatient Hospital Stay (HOSPITAL_COMMUNITY): Payer: Medicare Other | Admitting: Speech Pathology

## 2017-07-07 ENCOUNTER — Inpatient Hospital Stay (HOSPITAL_COMMUNITY): Payer: Medicare Other

## 2017-07-07 ENCOUNTER — Inpatient Hospital Stay (HOSPITAL_COMMUNITY): Payer: Medicare Other | Admitting: Physical Therapy

## 2017-07-07 LAB — BASIC METABOLIC PANEL
Anion gap: 12 (ref 5–15)
BUN: 24 mg/dL — ABNORMAL HIGH (ref 6–20)
CALCIUM: 9.4 mg/dL (ref 8.9–10.3)
CO2: 21 mmol/L — AB (ref 22–32)
CREATININE: 1.24 mg/dL (ref 0.61–1.24)
Chloride: 106 mmol/L (ref 101–111)
GFR, EST NON AFRICAN AMERICAN: 53 mL/min — AB (ref >60)
Glucose, Bld: 126 mg/dL — ABNORMAL HIGH (ref 65–99)
Potassium: 3.9 mmol/L (ref 3.5–5.1)
SODIUM: 139 mmol/L (ref 135–145)

## 2017-07-07 MED ORDER — PRO-STAT SUGAR FREE PO LIQD
30.0000 mL | Freq: Every day | ORAL | Status: DC
  Administered 2017-07-07 – 2017-07-14 (×8): 30 mL via ORAL
  Filled 2017-07-07 (×8): qty 30

## 2017-07-07 NOTE — Progress Notes (Signed)
[facility] PHYSICAL MEDICINE & REHABILITATION     PROGRESS NOTE    Subjective/Complaints: Sleep was inconsistent. No new issues. Pt somewhat awake this morning but incoherent.  ROS: Limited due to cognitive/behavioral   Objective: Vital Signs: Blood pressure (!) 157/76, pulse 61, temperature 98.8 F (37.1 C), temperature source Oral, resp. rate 16, height 5\' 3"  (1.6 m), weight 85.3 kg (188 lb 0.8 oz), SpO2 96 %. No results found. No results for input(s): WBC, HGB, HCT, PLT in the last 72 hours. No results for input(s): NA, K, CL, GLUCOSE, BUN, CREATININE, CALCIUM in the last 72 hours.  Invalid input(s): CO CBG (last 3)  No results for input(s): GLUCAP in the last 72 hours.  Wt Readings from Last 3 Encounters:  07/07/17 85.3 kg (188 lb 0.8 oz)  06/30/17 81.6 kg (180 lb)  03/25/17 82.1 kg (181 lb)    Physical Exam:  Constitutional: No distress. Vital signs reviewed HENT: Normocephalic.  Eyes: opens eyes intermittently Cardiovascular: RRR without murmur. No JVD  Respiratory:  CTA Bilaterally without wheezes or rales. Normal effort    GI: Bowel sounds are normal. He exhibits no distension. Skin: Skin is warm and dry. Craniotomy site clean and dry Neurological: arousal comes and goes Unable to participation in MMT or sensation testing   Psych:  pleasant  Assessment/Plan: 1. Functional and cognitive deficits secondary to right temporal ICH which require 3+ hours per day of interdisciplinary therapy in a comprehensive inpatient rehab setting. Physiatrist is providing close team supervision and 24 hour management of active medical problems listed below. Physiatrist and rehab team continue to assess barriers to discharge/monitor patient progress toward functional and medical goals.  Function:  Bathing Bathing position   Position: Bed  Bathing parts   Body parts bathed by helper: Right arm, Left arm, Chest, Abdomen, Front perineal area, Buttocks, Right upper leg, Left upper  leg, Right lower leg, Left lower leg, Back  Bathing assist Assist Level: 2 helpers      Upper Body Dressing/Undressing Upper body dressing   What is the patient wearing?: Hospital gown                Upper body assist Assist Level:  (Dependent)      Lower Body Dressing/Undressing Lower body dressing   What is the patient wearing?: Non-skid slipper socks, Ted Hose           Non-skid slipper socks- Performed by helper: Don/doff right sock, Don/doff left sock               TED Hose - Performed by helper: Don/doff right TED hose, Don/doff left TED hose  Lower body assist Assist for lower body dressing: 2 Helpers      Toileting Toileting Toileting activity did not occur: No continent bowel/bladder event   Toileting steps completed by helper: Adjust clothing prior to toileting, Performs perineal hygiene, Adjust clothing after toileting    Toileting assist Assist level: Two helpers   Transfers Chair/bed transfer   Chair/bed transfer method: Other Chair/bed transfer assist level: 2 helpers Chair/bed transfer assistive device: Systems developer lift: Air traffic controller Ambulation activity did not occur: Safety/medical concerns (fatigue)         Wheelchair Wheelchair activity did not occur: Safety/medical concerns     Assist Level: Dependent (Pt equals 0%)  Cognition Comprehension Comprehension assist level: Understands basic less than 25% of the time/ requires cueing >75% of the time  Expression Expression assist level: Expresses basis less than  25% of the time/requires cueing >75% of the time.  Social Interaction Social Interaction assist level: Interacts appropriately 25 - 49% of time - Needs frequent redirection.  Problem Solving Problem solving assist level: Solves basic less than 25% of the time - needs direction nearly all the time or does not effectively solve problems and may need a restraint for safety  Memory Memory assist  level: Recognizes or recalls less than 25% of the time/requires cueing greater than 75% of the time   Medical Problem List and Plan: 1. Decreased functional mobilitysecondary to right temporal ICH with IVH status post craniotomy for hematoma evacuation 06/24/2017 -continue therapies  -will check HCT to assess interval improvement of cerebral edema 2. DVT Prophylaxis/Anticoagulation: Subcutaneous heparin for DVT prophylaxis initiated 06/29/2017 3. Pain Management/back pain: Discontinue hydrocodone due to somnolence.   Monitor mental status  Topamax started 9/2---changed to HS 4. Mood:dc amantadine---change to ritalin beginning at noon today 5. Neuropsych: This patient is notcapable of making decisions on hisown behalf. -continue sleep chart -limiting neuro-sedating medications. dc Seroquel  -expect slow course cognitively given cerebral edema however  -stimulate during day with ritalin  -ua/ucx with multiple species. 6. Skin/Wound Care: Routine skin checks 7. Fluids/Electrolytes/Nutrition: encourage PO as possible     -cal ct  -AKI, Bun/Cr trending up, encourage fluids   -follow up labs pending 8.Seizure prophylaxis. Valproate 500 mg every 12 hours. EEG negative  -VPA level 55 on 8/29 9.CAD with cardiac catheterization 03/26/2015. Continue low-dose aspirin. 10.Hypertension/PAF. Lisinopril 5 mg daily, Lopressor 12.5 mg twice a day.   Norvasc 2.5 started on 9/1 to limit renal effects  Cont to monitor 11.Hyperlipidemia. Lipitor 12.Hypothyroidism. Synthroid. Follow up levels as outpt.    LOS (Days) 7 A FACE TO FACE EVALUATION WAS PERFORMED  Ranelle OysterSWARTZ,ZACHARY T, MD 07/07/2017 9:25 AM

## 2017-07-07 NOTE — Progress Notes (Signed)
Occupational Therapy Session Note  Patient Details  Name: Randy Durham MRN: [id number] Date of Birth: 09/30/1934  Today's Date: 07/07/2017 OT Individual Time: 4098-11910947-1045 OT Individual Time Calculation (min): 58 min    Short Term Goals: Week 1:  OT Short Term Goal 1 (Week 1): Pt will maintain attention and alertness for 10 min to participate in bathing. OT Short Term Goal 2 (Week 1): Pt will be able to bring a cup to his mouth with min A. OT Short Term Goal 3 (Week 1): Pt will be able to bathe UB with mod-max A.  OT Short Term Goal 4 (Week 1): Pt will be able to sit unsupported for 10 min with mod A to progress to sitting on a BSC.  Skilled Therapeutic Interventions/Progress Updates:    Upon entering the room, pt supine in bed with son present in room. Skilled OT session with focus on initiation and alertness during routine self care task. Pt transferred from bed >roll in shower chair with +2 three muskateer style transfer. While in shower pt required max multimodal cues and increased time for initiation this session. Pt keeping eyes open this session and initially needing hand over hand to begin initiation. He was able to wash hair, wash partial parts of body, and pull pants slightly up LE's. Pt required +2 assistance to stand while second helper pull pants over B hips.  Pt declined oral hygiene and education provided to caregiver regarding proper use of suction toothbrush to assist safely. OT also educated family member on decreasing distractions during therapy session in order to increase initiation and attention to task. Caregiver verbalized understanding and pt remained in wheelchair with belt on for safety.   Therapy Documentation Precautions:  Precautions Precautions: Fall Restrictions Weight Bearing Restrictions: No General: General PT Missed Treatment Reason: CT/MRI Vital Signs:  Pain: Pain Assessment Pain Score: 0-No pain ADL: ADL ADL Comments: refer to functional  navigator  See Function Navigator for Current Functional Status.   Therapy/Group: Individual Therapy  Alen BleacherBradsher, Burma Ketcher P 07/07/2017, 12:47 PM

## 2017-07-07 NOTE — Progress Notes (Addendum)
Physical Therapy Session Note  Patient Details  Name: Randy Durham MRN: [id number] Date of Birth: 02/10/1934  Today's Date: 07/07/2017 PT Individual Time: 1135-1205 PT Individual Time Calculation (min): 30 min   Short Term Goals: Week 1:  PT Short Term Goal 1 (Week 1):  Pt will roll with mod assist and min cues  PT Short Term Goal 1 - Progress (Week 1): Not met PT Short Term Goal 2 (Week 1): Pt will maintain static sitting balance with min assist  PT Short Term Goal 2 - Progress (Week 1): Met PT Short Term Goal 3 (Week 1): Pt will supine<>sit with mod assist  PT Short Term Goal 3 - Progress (Week 1): Not met  Skilled Therapeutic Interventions/Progress Updates:  Pt missed first 30 minutes of skilled PT treatment 2/2 being off unit for CT scan. After pt returned to unit pt received in bed asleep with family present but exiting upon PT arrival. Pt required multiple sternal rubs and adjustment in [facility] positioning before able to open eyes. Pt unable to focus on therapist during session. Pt required max assist for supine>sitting EOB as pt without any initiation or participation in movement. Pt transferred bed>TIS w/c via stedy lift with +2 assist. Pt requires total assist for LE/UE placement, sequencing, and initiation of task. Pt requires +2 dependent assist to scoot back in seat of w/c. Transported pt to [employer] and attempted to have him engage in dynavision with lights off and door closed for quiet, controlled environment. Pt required total hand over hand assist to engage in activity. Pt with L inattention noted during session. Pt attempting to verbalize but no clear words communicated. At end of session pt left sitting in TIS w/c with QRB donned & family present to supervise.  Therapy Documentation Precautions:  Precautions Precautions: Fall Restrictions Weight Bearing Restrictions: No   General: PT Amount of Missed Time (min): 30 Minutes PT Missed Treatment Reason: CT/MRI, pt going off unit  for imaging  See Function Navigator for Current Functional Status.   Therapy/Group: Individual Therapy  Waunita Schooner 07/07/2017, 12:12 PM

## 2017-07-07 NOTE — Plan of Care (Signed)
Problem: RH Ambulation Goal: LTG Patient will ambulate in home environment (PT) LTG: Patient will ambulate in home environment, # of feet with assistance (PT).  Outcome: Not Applicable Date Met: 43/83/77 D/c goal - not appropriate at this time

## 2017-07-07 NOTE — Progress Notes (Signed)
Calorie Count Note  48 hour calorie count ordered.  Diet: Dysphagia 2 with thin liquids (8/30-9/1), Dysphagia 1 with thin liquids (9/1-present) Supplements: Ensure Enlive po TID, each supplement provides 350 kcal and 20 grams of protein  Day 1: Breakfast: 90 kcal, 6 grams of protein Lunch: 87 kcal, 3 grams of protein Dinner: 156 kcal, 6 grams of protein Supplements: 700 kcal, 40 grams of protein  Day 1 Total intake: 1033 kcal (61% of minimum estimated needs)  55 grams of protein (58% of minimum estimated needs)  Day 2: Breakfast: 46 kcal, 4 grams of protein Lunch: 148 kcal, 14 grams of protein Dinner: 166 kcal, 6 grams of protein Supplements: 875 kcal, 50 grams of protein  Day 2 Total intake: 1235 kcal (73% of minimum estimated needs)  74 grams of protein (78% of minimum estimated needs)  Estimated Nutritional Needs:  Kcal:  1700-1900 Protein:  95-110 grams Fluid:  > 1.7 L/day  Nutrition Dx:  Inadequate oral intake related to lethargy/confusion as evidenced by meal completion < 50%, per patient/family report; improving  Goal:  Pt to meet >/= 90% of their estimated nutrition needs; progressing  Intervention:   Discontinue calorie count.  Continue Ensure Enlive po TID, each supplement provides 350 kcal and 20 grams of protein.  Provide 30 ml Prostat po once daily, each supplement provides 100 kcal and 15 grams of protein.   Roslyn SmilingStephanie Rayner Erman, MS, RD, LDN Pager # [phone] After hours/ weekend pager # [phone]

## 2017-07-07 NOTE — Plan of Care (Signed)
Problem: RH Stairs Goal: LTG Patient will ambulate up and down stairs w/assist (PT) LTG: Patient will ambulate up and down # of stairs with assistance (PT)  Outcome: Not Applicable Date Met: 53/00/51 D/c goal - not appropriate at this time

## 2017-07-07 NOTE — Progress Notes (Signed)
STROKE TEAM PROGRESS NOTE   SUBJECTIVE (INTERVAL HISTORY) His  wife and son iare at the bedside. Stroke team has been asked to reconsult due to new stroke and neurological change. The patient had neurological change last Thursday night and was Lethargic and hard to arouse the next couple of days. Since then his more alert but has had decline in his speech and communication. A CT scan of the head was done today which have personally reviewed shows hypodensity in the left parietal region consistent with subacute embolic infarct. The right sided hemorrhage shows expected evolutionary changes and is now isodense. Patient has been off eliquis since 06/24/17 hospital admission and is currently on aspirin  OBJECTIVE Temp:  [97.6 F (36.4 C)-98.8 F (37.1 C)] 97.6 F (36.4 C) (09/04 1510) Pulse Rate:  [61-80] 80 (09/04 1510) Resp:  [16-17] 17 (09/04 1510) BP: (145-176)/(76-79) 145/77 (09/04 1510) SpO2:  [96 %-100 %] 100 % (09/04 1510) Weight:  [188 lb 0.8 oz (85.3 kg)] 188 lb 0.8 oz (85.3 kg) (09/04 0700)  CBC:   Recent Labs Lab 07/01/17 0534  WBC 7.8  NEUTROABS 5.6  HGB 13.5  HCT 40.0  MCV 84.2  PLT 291    Basic Metabolic Panel:   Recent Labs Lab 07/04/17 0848 07/07/17 0924  NA 139 139  K 4.0 3.9  CL 102 106  CO2 27 21*  GLUCOSE 101* 126*  BUN 16 24*  CREATININE 1.07 1.24  CALCIUM 9.2 9.4    Lipid Panel:     Component Value Date/Time   CHOL 131 05/10/2015 1039   TRIG 101 06/24/2017 0930   HDL 45.00 05/10/2015 1039   CHOLHDL 3 05/10/2015 1039   VLDL 16.0 05/10/2015 1039   LDLCALC 70 05/10/2015 1039   HgbA1c:  Lab Results  Component Value Date   HGBA1C 5.6 06/24/2017   Urine Drug Screen: No results found for: LABOPIA, COCAINSCRNUR, LABBENZ, AMPHETMU, THCU, LABBARB  Alcohol Level No results found for: ETH  IMAGING  Ct Angio Head W Or Wo Contrast Ct Angio Neck W Or Wo Contrast 06/24/2017   IMPRESSION: 1. Unchanged volume of intraparenchymal hemorrhage in the  right temporal lobe. Unchanged intraventricular extension and ventricular configuration. 2. No emergent large vessel occlusion. Poor visualization of the origin of the right posterior cerebral artery, which probably arises from a small right posterior communicating artery. The remainder of the visible PCA is normal. 3. No vascular abnormality to account for the intraparenchymal hemorrhage. In a patient of this age, lobar parenchymal hemorrhage may be secondary to underlying amyloid angiopathy for hypertension. 4. Carotid bifurcation and Aortic Atherosclerosis (ICD10-I70.0) without hemodynamically significant stenosis.  Ct Head Wo Contrast 06/24/2017 IMPRESSION: Large right temporal intraparenchymal hemorrhage with intraventricular extension, mild surrounding edema. Hemorrhage fills the right lateral ventricle and tracks into the third, fourth, and left lateral ventricles. The basilar cisterns remain patent, there is no hydrocephalus.    06/25/2017 IMPRESSION: 1. Status post RIGHT craniotomy for temporoparietal hematoma evacuation. Local edema and small amount of residual intraparenchymal hemorrhage. No residual midline shift.  2. Small RIGHT cerebellar tentorium subdural hematoma and trace subarachnoid hemorrhage, likely re- distributed.  3. Intraventricular blood products without hydrocephalus.  Mr Randy Durham Wo Contrast 06/24/2017 IMPRESSION: 1. Postsurgical changes from interval right temporal hematoma evacuation. 2. Small volume acute ischemia just posterior to the surgical cavity. 3. Decreased intraventricular blood products. 4. Small right and tiny left subdural fluid collections without significant mass effect. 5. Scattered bilateral sulcal FLAIR signal abnormality which may reflect a combination  of small volume subarachnoid blood products and artifact.     CT Head 06/27/17 :  1. Evolving infarct along the posterior margin of the intraparenchymal hematoma site. Increased cytotoxic edema  there since 06/24/2017 but no significant mass effect. 2. Stable to mildly regressed intracranial hemorrhage; intraparenchymal, intraventricular, and extra-axial. 3. Postoperative changes including continued pneumocephalus.  CT HEAD 07/07/17 : 1. Large acute or early subacute infarct in the inferior division left MCA territory and left basal ganglia. 2. Diminishing intracranial hemorrhage as described. Stable ventricular volume. PHYSICAL EXAM Elderly obese Caucasian male who is currently is  Not in distress . Afebrile. Head is nontraumatic. Neck is supple without bruit.    Cardiac exam no murmur or gallop. Lungs are clear to auscultation. Distal pulses are well felt. Neurological Exam ; sleepy but  eyes open partially to sternal rub.right gaze preference and partial left gaze palsy. Left hemianopsia. Mild right left confusion.  .  Able to move all 4 extremity   against gravity but left side less than the right. Pupils small irregular but reactive. Fundi could not be visualized. Mild left lower facial weakness. Tongue midline. Tone appears to be normal bilaterally. Both plantars are downgoing.  ASSESSMENT/PLAN Randy Durham is a 83 y.o. male with history of CAD, CKD, HLD, HTN, hypothyroidism and spinal stenosis who presented to the ED with somnolence and headache since Tuesday morning.  He did not receive IV t-PA due to ICH.   Stroke: Large R temporal IPH with extension to R lateral, 3rd, and 4th ventricle with mild associated cytotoxic edema, and no hydrocephalus, in the setting of moderate hypertension and anticoagulation with Eliquis and aspirin, and possible amyloid angiopathy. Status post craniotomy for hematoma evacuation on 06/24/17 Embolic left MCA inferior division infarct with cytotoxic edema suspect 5 days ago- afib off anticoagulation due to recent ICH  Resultant  Global apahsia CT head:  Cytotoxic edema in the inferior division left MCA territory,There is small volume subarachnoid  hemorrhage, greatest along the posterior left cerebral convexity. Small volume intraventricular hemorrhage. Intracranial hemorrhage has diminished from prior. Less mass effect at the posterior right cerebral hematoma evacuation site which is predominately low-density. Incidental small low-density collection along the bone flap.  affecting a large volume  MRI head 06/24/17(POD#0). Small volume acute ischemia posterior to surgical cavity, small right and tiny left subdural fluid collections without significant mass effect, small volume subarachnoid blood products versus artifact.  MRA head: not ordered  CTA head/neck 06/24/17: No vascular abnormality to account for the intraparenchymal hemorrhage  2D Echo: not ordered   LDL 70  HgbA1c 5.6  SCDs for VTE prophylaxis DIET - DYS 1 Room service appropriate? Yes; Fluid consistency: Thin  aspirin 81 mg daily and Eliquis (apixaban) daily prior to admission, now on aspirin 81 mg daily  Patient counseled to be compliant with his antithrombotic medications  Ongoing aggressive stroke risk factor management  Therapy recommendations:   CLR Disposition:   CLR   I have personally examined this patient, reviewed notes, independently viewed imaging studies, participated in medical decision making and plan of care.ROS completed by me personally and pertinent positives fully documented  I have made any additions or clarifications directly to the above note. The patient presents a challenging high-risk situation. He developed hemorrhagic infarct on eliquis and now after discontinuing eliquis has developed an embolic nonhemorrhagic infarct while he was on aspirin alone. I had a long discussion the patient's, wife and son about risk-benefit of initiating anticoagulation so soon after a  brain hemorrhage and he understand the risk and would like to resume eliquis. Recommend eliquis 5 mg twice daily alone and discontinue aspirin. Check EEG for seizure activity.  Continue ongoing therapies as per rehabilitation team. Kindly call for questions. Greater than 50% time during this 35 minute visit was spent on counseling and coordination of care about his intracerebral hemorrhage, atrial fibrillation, embolic stroke and discussion about risk-benefit of starting  eliquis back.  Delia HeadyPramod Itali Mckendry, MD Medical Director [facility] Pager: [phone] 07/07/2017 6:04 PM      Delia HeadyPramod Saivon Prowse, MD Medical Director [facility] Pager: [phone] 07/07/2017 6:03 PM   To contact Stroke Continuity provider, please refer to [email or web address]. After hours, contact General Neurology

## 2017-07-07 NOTE — Progress Notes (Signed)
Speech Language Pathology Daily Session Notes  Patient Details  Name: Randy Durham MRN: [id number] Date of Birth: 10/18/1934  Today's Date: 07/07/2017  Session 1: SLP Individual Time: [id number] SLP Individual Time Calculation (min): 45 min    Session 2: SLP Individual Time: 1400-1430 SLP Individual Time Calculation (min): 30 min   Short Term Goals: Week 1: SLP Short Term Goal 1 (Week 1): Pt will demonstrate focused attention to functional task for 30 seconds with Max multimodal cues. SLP Short Term Goal 2 (Week 1): Pt will consistently demonstrate O x 4 with Max multimodal cues. SLP Short Term Goal 3 (Week 1): Pt will answer yes/no questions with 30% accuracy using max multimodal cues. SLP Short Term Goal 4 (Week 1): Pt will communicate basic wants and needs via multimodal communciation with Max A verbal cues. SLP Short Term Goal 5 (Week 1): Pt will initate basic familar ADL tasks in 30% of opportunties with Max multimodal cues. SLP Short Term Goal 6 (Week 1): Patient will consume current diet with minimal overt s/s of aspiration with Mod A verbal cues for use of swallowing compensatory strategies.   Skilled Therapeutic Interventions:  Session 1: Skilled treatment session focused on cognitive and dysphagia goals. Upon arrival, patient was awake while upright in bed. SLP facilitated session by providing total A for self-feeding with breakfast meal of Dys. 1 textures with thin liquids. Patient consumed meal without overt s/s of aspiration but demonstrated prolonged AP transit with pureed textures. Recommend patient continue current diet and only eat when he is awake/alert. Patient with increased verbal expression this session, however, speech was essentially unintelligible and void of meaning. Patient demonstrated 1 spontaneous purposeful behavior of wiping his mouth throughout the session. Patient left upright in bed with family present. Continue with current plan of care.     Session 2:  Skilled treatment session focused on cognitive-linguistic goals. Patient's eyes were open throughout the entire session, however, SLP facilitated session by providing total A for patient to initiate choosing/taking a functional item to perform a basic self-care task. Patient unable to name functional items or communicate his basic wants/needs. Patient also appeared to demonstrate severe visual impairments in regards to inability to locate/scan or attend to a specific item. Patient left upright in recliner family present. Continue with current plan of care.    Function:  Eating Eating   Modified Consistency Diet: Yes Eating Assist Level: Helper feeds patient   Eating Set Up Assist For: Opening containers;Cutting food;Applying device (includes dentures) Helper Scoops Food on Utensil: Every scoop     Cognition Comprehension Comprehension assist level: Understands basic less than 25% of the time/ requires cueing >75% of the time  Expression   Expression assist level: Expresses basis less than 25% of the time/requires cueing >75% of the time.  Social Interaction Social Interaction assist level: Interacts appropriately 25 - 49% of time - Needs frequent redirection.  Problem Solving Problem solving assist level: Solves basic less than 25% of the time - needs direction nearly all the time or does not effectively solve problems and may need a restraint for safety  Memory Memory assist level: Recognizes or recalls less than 25% of the time/requires cueing greater than 75% of the time    Pain Pain Assessment Faces Pain Scale: No hurt  Therapy/Group: Individual Therapy  Amaliya Whitelaw 07/07/2017, 3:16 PM

## 2017-07-07 NOTE — Progress Notes (Signed)
Physical Therapy Weekly Progress Note  Patient Details  Name: Randy Durham MRN: [id number] Date of Birth: 1934/08/17  Beginning of progress report period: July 01, 2017 End of progress report period: July 08, 2017  Today's Date: 07/07/2017   Patient has met 1 of 3 short term goals.  Pt is making very slow progress towards PT goals as he has been limited by lethargy and decreased arousal during sessions. On one date pt was able to tolerate standing in standing frame x 5 minutes. Pt also is able to maintain sitting balance on EOB with steady assist. Pt has been unable to follow one step commands. Pt would benefit from continued skilled PT to focus on bed mobility, transfers, activity tolerance, sitting balance, family education, cognitive remediation, and to initiate gait training as able. Family has been present to observe sessions and are aware of pt's limited progress.  Patient continues to demonstrate the following deficits: muscle weakness, decreased cardiorespiratory endurance, decreased coordination, decreased visual perceptual skills, decreased initiation, decreased attention, decreased awareness, decreased problem solving, decreased safety awareness, decreased memory and delayed processing, and decreased sitting balance, decreased standing balance, decreased postural control, hemiplegia and decreased balance strategies and therefore will continue to benefit from skilled PT intervention to increase functional independence with mobility.  Patient is making limited progress towards goals 2/2 fatigue and difficulty arousing him during sessions.  Ambulation goals have been discharged and w/c level goals have been established.   PT Short Term Goals Week 1:  PT Short Term Goal 1 (Week 1):  Pt will roll with mod assist and min cues  PT Short Term Goal 1 - Progress (Week 1): Not met PT Short Term Goal 2 (Week 1): Pt will maintain static sitting balance with min assist  PT Short Term Goal 2  - Progress (Week 1): Met PT Short Term Goal 3 (Week 1): Pt will supine<>sit with mod assist  PT Short Term Goal 3 - Progress (Week 1): Not met Week 2:  PT Short Term Goal 1 (Week 2): Pt will perform rolling in bed L<>R with hospital bed features and max assist +1.  PT Short Term Goal 2 (Week 2): Pt will transfer supine<>sitting EOB with max assist +1.  PT Short Term Goal 3 (Week 2): Pt will transfer bed<>chair with max assist +1.    Therapy Documentation Precautions:  Precautions Precautions: Fall Restrictions Weight Bearing Restrictions: No  See Function Navigator for Current Functional Status.  Therapy/Group: Individual Therapy  Waunita Schooner 07/08/2017, 8:04 AM

## 2017-07-07 NOTE — Plan of Care (Signed)
Problem: RH Ambulation Goal: LTG Patient will ambulate in controlled environment (PT) LTG: Patient will ambulate in a controlled environment, # of feet with assistance (PT). 10 ft with LRAD  Problem: RH Wheelchair Mobility Goal: LTG Patient will propel w/c in controlled environment (PT) LTG: Patient will propel wheelchair in controlled environment, # of feet with assist (PT) 30 ft

## 2017-07-08 ENCOUNTER — Inpatient Hospital Stay (HOSPITAL_COMMUNITY): Payer: Medicare Other | Admitting: Occupational Therapy

## 2017-07-08 ENCOUNTER — Inpatient Hospital Stay (HOSPITAL_COMMUNITY): Payer: Medicare Other | Admitting: Physical Therapy

## 2017-07-08 ENCOUNTER — Inpatient Hospital Stay (HOSPITAL_COMMUNITY): Payer: Medicare Other | Admitting: Speech Pathology

## 2017-07-08 NOTE — Progress Notes (Signed)
Physical Therapy Note  Patient Details  Name: Randy Durham MRN: [id number] Date of Birth: [birth year] Today's Date: 07/08/2017    Pt's plan of care adjusted to 15/7 after speaking with care team and discussed with PA as pt currently unable to tolerate current therapy schedule with OT, PT, and SLP.     [name] 07/08/2017, 3:43 PM

## 2017-07-08 NOTE — Progress Notes (Signed)
[facility] MEDICINE & REHABILITATION     PROGRESS NOTE    Subjective/Complaints: Sleep was inconsistent. No new issues. Pt somewhat awake this morning but incoherent.  ROS: Limited due to cognitive/behavioral   Objective: Vital Signs: Blood pressure (!) 154/71, pulse 63, temperature 98.3 F (36.8 C), temperature source Oral, resp. rate 16, height 5\' 3"  (1.6 m), weight 84.8 kg (187 lb), SpO2 98 %. Ct Head Wo Contrast  Result Date: 07/07/2017 CLINICAL DATA:  Followup intracranial hemorrhage. EXAM: CT HEAD WITHOUT CONTRAST TECHNIQUE: Contiguous axial images were obtained from the base of the skull through the vertex without intravenous contrast. COMPARISON:  06/28/2017 FINDINGS: Brain: Cytotoxic edema in the inferior division left MCA territory, affecting a large volume. This extends from the lateral temporal lobe to the parietal cortex. There is also a band of infarct across the basal ganglia and anterior limb internal capsule. There is small volume subarachnoid hemorrhage, greatest along the posterior left cerebral convexity. Small volume intraventricular hemorrhage. Intracranial hemorrhage has diminished from prior. Less mass effect at the posterior right cerebral hematoma evacuation site which is predominately low-density. Incidental small low-density collection along the bone flap. Vascular: There is a background of cerebral atrophy and chronic small vessel ischemia. Skull: Unremarkable right parietal craniotomy site. Sinuses/Orbits: Negative Other: These results were called by telephone at the time of interpretation on 07/07/2017 at 11:44 am to Dr. Faith Rogue , who verbally acknowledged these results. IMPRESSION: 1. Large acute or early subacute infarct in the inferior division left MCA territory and left basal ganglia. 2. Diminishing intracranial hemorrhage as described. Stable ventricular volume. Electronically Signed   By: Marnee Spring M.D.   On: 07/07/2017 11:44   No results  for input(s): WBC, HGB, HCT, PLT in the last 72 hours.  Recent Labs  07/07/17 0924  NA 139  K 3.9  CL 106  GLUCOSE 126*  BUN 24*  CREATININE 1.24  CALCIUM 9.4   CBG (last 3)  No results for input(s): GLUCAP in the last 72 hours.  Wt Readings from Last 3 Encounters:  07/08/17 84.8 kg (187 lb)  06/30/17 81.6 kg (180 lb)  03/25/17 82.1 kg (181 lb)    Physical Exam:  Constitutional: No distress. Vital signs reviewed HENT: Normocephalic.  Eyes: opens eyes intermittently Cardiovascular: RRR without murmur. No JVD  Respiratory:  CTA Bilaterally without wheezes or rales. Normal effort    GI: Bowel sounds are normal. He exhibits no distension. Skin: Skin is warm and dry. Craniotomy site clean and dry Neurological: arousal comes and goes Unable to participation in MMT or sensation testing   Psych:  pleasant  Assessment/Plan: 1. Functional and cognitive deficits secondary to right temporal ICH which require 3+ hours per day of interdisciplinary therapy in a comprehensive inpatient rehab setting. Physiatrist is providing close team supervision and 24 hour management of active medical problems listed below. Physiatrist and rehab team continue to assess barriers to discharge/monitor patient progress toward functional and medical goals.  Function:  Bathing Bathing position   Position: Shower  Bathing parts   Body parts bathed by helper: Right arm, Left arm, Chest, Abdomen, Front perineal area, Buttocks, Right upper leg, Left upper leg, Right lower leg, Left lower leg, Back  Bathing assist Assist Level: 2 helpers      Upper Body Dressing/Undressing Upper body dressing   What is the patient wearing?: Pull over shirt/dress       Pull over shirt/dress - Perfomed by helper: Thread/unthread right sleeve, Thread/unthread left sleeve, Put  head through opening, Pull shirt over trunk        Upper body assist Assist Level:  (total)      Lower Body Dressing/Undressing Lower body  dressing   What is the patient wearing?: Pants, Shoes       Pants- Performed by helper: Thread/unthread right pants leg, Thread/unthread left pants leg, Pull pants up/down   Non-skid slipper socks- Performed by helper: Don/doff right sock, Don/doff left sock       Shoes - Performed by helper: Don/doff right shoe, Don/doff left shoe       TED Hose - Performed by helper: Don/doff right TED hose, Don/doff left TED hose  Lower body assist Assist for lower body dressing: 2 Helpers      Toileting Toileting Toileting activity did not occur: No continent bowel/bladder event   Toileting steps completed by helper: Adjust clothing prior to toileting, Performs perineal hygiene, Adjust clothing after toileting    Toileting assist Assist level: Two helpers   Transfers Chair/bed transfer   Chair/bed transfer method: Stand pivot Chair/bed transfer assist level: 2 helpers Chair/bed transfer assistive device: Mechanical lift Mechanical lift: Landscape architect Ambulation activity did not occur: Safety/medical concerns (fatigue)         Wheelchair Wheelchair activity did not occur: Safety/medical concerns     Assist Level: Dependent (Pt equals 0%)  Cognition Comprehension Comprehension assist level: Understands basic less than 25% of the time/ requires cueing >75% of the time  Expression Expression assist level: Expresses basis less than 25% of the time/requires cueing >75% of the time.  Social Interaction Social Interaction assist level: Interacts appropriately 25 - 49% of time - Needs frequent redirection.  Problem Solving Problem solving assist level: Solves basic less than 25% of the time - needs direction nearly all the time or does not effectively solve problems and may need a restraint for safety  Memory Memory assist level: Recognizes or recalls less than 25% of the time/requires cueing greater than 75% of the time   Medical Problem List and Plan: 1. Decreased  functional mobilitysecondary to right temporal ICH with IVH status post craniotomy for hematoma evacuation 06/24/2017 -continue therapies. He is more alert today but appears aphasic  -discussed case with Dr. Pearlean Brownie. Appreciate neuro help. eliquis resumed, family aware of risk/benefit given the likelihood of embolic infarc  -as far as functional prognosis, it is obviously diminished given new CVA. I would like to see how fairs over the course of the week. If we can justify continuing therapy at this level he may continue, but likely will need SNF 2. DVT Prophylaxis/Anticoagulation: Subcutaneous heparin for DVT prophylaxis initiated 06/29/2017 3. Pain Management/back pain: Discontinue hydrocodone due to somnolence.   Monitor mental status  Topamax started 9/2---changed to HS 4. Mood:dc amantadine---change to ritalin beginning at noon today 5. Neuropsych: This patient is notcapable of making decisions on hisown behalf. -continue sleep chart -limiting neuro-sedating medications. off Seroquel  -stimulate during day with ritalin  -ua/ucx with multiple species. 6. Skin/Wound Care: Routine skin checks 7. Fluids/Electrolytes/Nutrition: encourage PO as possible     -cal ct  -AKI, Bun/Cr trending up, encourage fluids   -follow up labs later this week 8.Seizure prophylaxis. Valproate 500 mg every 12 hours. EEG negative  -VPA level 55 on 8/29 9.CAD with cardiac catheterization 03/26/2015. Continue low-dose aspirin. 10.Hypertension/PAF. Lisinopril 5 mg daily, Lopressor 12.5 mg twice a day.   Norvasc 2.5 started on 9/1 to limit renal effects  Cont to monitor 11.Hyperlipidemia. Lipitor  12.Hypothyroidism. Synthroid. Follow up levels as outpt.    LOS (Days) 8 A FACE TO FACE EVALUATION WAS PERFORMED  Ranelle OysterSWARTZ,Veona Bittman T, MD 07/08/2017 10:25 AM

## 2017-07-08 NOTE — Progress Notes (Signed)
Occupational Therapy Session Note  Patient Details  Name: Randy Durham MRN: [id number] Date of Birth: [birth year]  Today's Date: 07/08/2017 OT Co-Treatment Time: 1015-1100 Skilled Co-tx with Aleda GranaVictoria Miller, DPT [phone]) OT Co-Treatment Time Calculation (min): 45 min   Short Term Goals: Week 1:  OT Short Term Goal 1 (Week 1): Pt will maintain attention and alertness for 10 min to participate in bathing. OT Short Term Goal 2 (Week 1): Pt will be able to bring a cup to his mouth with min A. OT Short Term Goal 3 (Week 1): Pt will be able to bathe UB with mod-max A.  OT Short Term Goal 4 (Week 1): Pt will be able to sit unsupported for 10 min with mod A to progress to sitting on a BSC.  Skilled Therapeutic Interventions/Progress Updates:    Skilled Co- treatment with PT to address initiation, sequencing, weight shift, sit <>stand, and bed mobility. Pt supine in bed upon entering the room and transferred to EOB with total A. Pt standing into STEDY with +2 assistance and transferred into tilt in space wheelchair. Pt engaged in lite gait task with +2 assistance. Please see PT note for further details. Pt returned to room and unable to stand into STEDY with +2 assist secondary to fatigue. Pt returned to bed via maxi sky with +2 assistance for pt and therapist safety. Pt rolling L <> R with total A for removal of sling. Pt incontinent of BM and required +2 assistance for hygiene and clothing management. Pt repositioned and remained in bed at end of session. Call bell and all needed items within reach upon exiting the room.    Therapy Documentation Precautions:  Precautions Precautions: Fall Restrictions Weight Bearing Restrictions: No ADL: ADL ADL Comments: refer to functional navigator  See Function Navigator for Current Functional Status.   Therapy/Group: Co-Treatment  Alen BleacherBradsher, Vernia Teem P 07/08/2017, 3:21 PM

## 2017-07-08 NOTE — Progress Notes (Signed)
Physical Therapy Session Note  Patient Details  Name: Randy Durham MRN: [id number] Date of Birth: Mar 28, 1934  Today's Date: 07/08/2017 PT Co-Treatment Time: [id number] (Total time [phone] ) PT Co-Treatment Time Calculation (min): 41 min   Short Term Goals: Week 2:  PT Short Term Goal 1 (Week 2): Pt will perform rolling in bed L<>R with hospital bed features and max assist +1.  PT Short Term Goal 2 (Week 2): Pt will transfer supine<>sitting EOB with max assist +1.  PT Short Term Goal 3 (Week 2): Pt will transfer bed<>chair with max assist +1.  Skilled Therapeutic Interventions/Progress Updates:  Pt received in bed; pt with behaviors demonstrating pain and pt repeating "oh" throughout session & RN aware but pt unable to have scheduled Tylenol yet. Pt requires total assist for supine>sitting EOB with manual facilitation for weight shifting. Pt transferred bed>w/c via stedy lift with +2 assist. Pt requires max assist to initiate sit<>stand transfers. Pt set up in lite gait to attempt BLE weight bearing and gait for NMR. Pt requires max/total assist to initiate sit>stand but able to initiate it on his own x 1 trial but not on command. Pt ambulated 20 ft + 10 ft with lite gait; attempted to reposition hand placement as pt pushed posterior then L during task. Pt able to advance feet but with short step & stride length. Pt with continued difficulty verbalizing and visually focusing on therapist during session. Pt required rest breaks 2/2 fatigue. At end of session attempted to assist pt back to bed via stedy lift but pt unable to assist with sit>stand despite +2 assist, therefore maxisky used to return pt to bed. Pt noted to be incontinent of bowel and required total assist for peri hygiene. At end of session pt left in bed with alarm set, bed in lowest setting, and rails up.  While sitting pt's BP = 134/60 mmHg (LUE), HR = 83 bpm While standing BP = 135/89 mmHg (LUE)  Please see OT's note for further  information regarding today's session.   Therapy Documentation Precautions:  Precautions Precautions: Fall Restrictions Weight Bearing Restrictions: No   See Function Navigator for Current Functional Status.   Therapy/Group: Co-Treatment  Sandi MariscalVictoria M Jawuan Robb 07/08/2017, 4:05 PM

## 2017-07-08 NOTE — Progress Notes (Signed)
Speech Language Pathology Weekly Progress and Session Note  Patient Details  Name: Randy Durham MRN: [id number] Date of Birth: Apr 18, 1934  Beginning of progress report period: July 01, 2017 End of progress report period: July 08, 2017  Today's Date: 07/08/2017 SLP Individual Time: 0830-0930 SLP Individual Time Calculation (min): 60 min  Short Term Goals: Week 1: SLP Short Term Goal 1 (Week 1): Pt will demonstrate focused attention to functional task for 30 seconds with Max multimodal cues. SLP Short Term Goal 1 - Progress (Week 1): Met SLP Short Term Goal 2 (Week 1): Pt will consistently demonstrate O x 4 with Max multimodal cues. SLP Short Term Goal 2 - Progress (Week 1): Not met SLP Short Term Goal 3 (Week 1): Pt will answer yes/no questions with 30% accuracy using max multimodal cues. SLP Short Term Goal 3 - Progress (Week 1): Not met SLP Short Term Goal 4 (Week 1): Pt will communicate basic wants and needs via multimodal communciation with Max A verbal cues. SLP Short Term Goal 4 - Progress (Week 1): Not met SLP Short Term Goal 5 (Week 1): Pt will initate basic familar ADL tasks in 30% of opportunties with Max multimodal cues. SLP Short Term Goal 5 - Progress (Week 1): Not met SLP Short Term Goal 6 (Week 1): Patient will consume current diet with minimal overt s/s of aspiration with Mod A verbal cues for use of swallowing compensatory strategies.  SLP Short Term Goal 6 - Progress (Week 1): Met    New Short Term Goals: Week 2: SLP Short Term Goal 1 (Week 2): Patient will consume current diet with minimal overt s/s of aspiration with Min A verbal cues for use of swallowing compensatory strategies.  SLP Short Term Goal 2 (Week 2): Pt will demonstrate sustained attention to functional task for 2 minutes with Max multimodal cues. SLP Short Term Goal 3 (Week 2): Pt will initate basic familar ADL tasks in 30% of opportunties with Max multimodal cues. SLP Short Term Goal 4 (Week 2):  Pt will answer yes/no questions with 30% accuracy with Max A multimodal cues. SLP Short Term Goal 5 (Week 2): Pt will communicate basic wants and needs via multimodal communciation with Max A verbal cues.  Weekly Progress Updates: Patient has made minimal and inconsistent gains this reporting period and has met 2 of 6 STG's this reporting period. Currently, patient is consuming Dys. 1 textures with thin liquids without overt s/s of aspiration. Patient continues to require overall Total A to complete functional and familiar tasks safely in regards to initiation, sustained attention, functional problem solving, awareness, and recall. Patient also requires overall Total A for basic auditory comprehension and functional communication.  Patient's progress declined as this week progressed, suspect due to new CVA as seen on CT 9/4. Patient and family education is ongoing. Patient would benefit from continued skilled SLP intervention to maximize his functional communication, swallowing function, cognitive function and overall functional independence prior to discharge.      Intensity: Minumum of 1-2 x/day, 30 to 90 minutes Frequency: 3 to 5 out of 7 days Duration/Length of Stay: TBD  Treatment/Interventions: Dysphagia/aspiration precaution training;Patient/family education;Functional tasks;Cognitive remediation/compensation;Speech/Language facilitation;Cueing hierarchy;Environmental controls;Therapeutic Activities   Daily Session  Skilled Therapeutic Interventions: Skilled treatment session focused on cognitive and dysphagia goals. Upon arrival, patient was awake while upright in bed. SLP facilitated session by providing Max A-Total A for self-feeding of ~20% of breakfast meal of Dys. 1 textures with thin liquids. Patient consumed meal without overt s/s  of aspiration but demonstrated prolonged AP transit with pureed textures. Recommend patient continue current diet and only eat when he is awake/alert. Patient  with increased verbal expression today, however, patient with ~20% intelligibility and verbalizations did not communicate any meaning. Patient also answered basic yes/no questions in regards to wants/needs in 30% of opportunities. Patient left upright in bed with family present. Continue with current plan of care.         Function:   Eating Eating   Modified Consistency Diet: Yes Eating Assist Level: Set up assist for;Hand over hand assist   Eating Set Up Assist For: Opening containers Helper Scoops Food on Utensil: Every scoop     Cognition Comprehension Comprehension assist level: Understands basic less than 25% of the time/ requires cueing >75% of the time  Expression   Expression assist level: Expresses basis less than 25% of the time/requires cueing >75% of the time.  Social Interaction Social Interaction assist level: Interacts appropriately 25 - 49% of time - Needs frequent redirection.  Problem Solving Problem solving assist level: Solves basic less than 25% of the time - needs direction nearly all the time or does not effectively solve problems and may need a restraint for safety  Memory Memory assist level: Recognizes or recalls less than 25% of the time/requires cueing greater than 75% of the time   Pain No/Denies Pain   Therapy/Group: Individual Therapy  Ahmyah Gidley, [place] 07/08/2017, 11:23 AM

## 2017-07-08 NOTE — Progress Notes (Signed)
STROKE TEAM PROGRESS NOTE   SUBJECTIVE (INTERVAL HISTORY) His  Daughter is  at the bedside.  She appears unrealistic about patient's now significant neurological deficits and is willing to discuss with patient's wife palliative care options OBJECTIVE Temp:  [98 F (36.7 C)-98.3 F (36.8 C)] 98 F (36.7 C) (09/05 1559) Pulse Rate:  [63-83] 83 (09/05 1559) Resp:  [16] 16 (09/05 1559) BP: (143-154)/(71-84) 143/84 (09/05 1559) SpO2:  [98 %-99 %] 99 % (09/05 1559) Weight:  [187 lb (84.8 kg)] 187 lb (84.8 kg) (09/05 0500)  CBC:  No results for input(s): WBC, NEUTROABS, HGB, HCT, MCV, PLT in the last 168 hours.  Basic Metabolic Panel:   Recent Labs Lab 07/04/17 0848 07/07/17 0924  NA 139 139  K 4.0 3.9  CL 102 106  CO2 27 21*  GLUCOSE 101* 126*  BUN 16 24*  CREATININE 1.07 1.24  CALCIUM 9.2 9.4    Lipid Panel:     Component Value Date/Time   CHOL 131 05/10/2015 1039   TRIG 101 06/24/2017 0930   HDL 45.00 05/10/2015 1039   CHOLHDL 3 05/10/2015 1039   VLDL 16.0 05/10/2015 1039   LDLCALC 70 05/10/2015 1039   HgbA1c:  Lab Results  Component Value Date   HGBA1C 5.6 06/24/2017   Urine Drug Screen: No results found for: LABOPIA, COCAINSCRNUR, LABBENZ, AMPHETMU, THCU, LABBARB  Alcohol Level No results found for: ETH  IMAGING  Ct Angio Head W Or Wo Contrast Ct Angio Neck W Or Wo Contrast 06/24/2017   IMPRESSION: 1. Unchanged volume of intraparenchymal hemorrhage in the right temporal lobe. Unchanged intraventricular extension and ventricular configuration. 2. No emergent large vessel occlusion. Poor visualization of the origin of the right posterior cerebral artery, which probably arises from a small right posterior communicating artery. The remainder of the visible PCA is normal. 3. No vascular abnormality to account for the intraparenchymal hemorrhage. In a patient of this age, lobar parenchymal hemorrhage may be secondary to underlying amyloid angiopathy for hypertension.  4. Carotid bifurcation and Aortic Atherosclerosis (ICD10-I70.0) without hemodynamically significant stenosis.  Ct Head Wo Contrast 06/24/2017 IMPRESSION: Large right temporal intraparenchymal hemorrhage with intraventricular extension, mild surrounding edema. Hemorrhage fills the right lateral ventricle and tracks into the third, fourth, and left lateral ventricles. The basilar cisterns remain patent, there is no hydrocephalus.    06/25/2017 IMPRESSION: 1. Status post RIGHT craniotomy for temporoparietal hematoma evacuation. Local edema and small amount of residual intraparenchymal hemorrhage. No residual midline shift.  2. Small RIGHT cerebellar tentorium subdural hematoma and trace subarachnoid hemorrhage, likely re- distributed.  3. Intraventricular blood products without hydrocephalus.  Mr Laqueta JeanBrain W Wo Contrast 06/24/2017 IMPRESSION: 1. Postsurgical changes from interval right temporal hematoma evacuation. 2. Small volume acute ischemia just posterior to the surgical cavity. 3. Decreased intraventricular blood products. 4. Small right and tiny left subdural fluid collections without significant mass effect. 5. Scattered bilateral sulcal FLAIR signal abnormality which may reflect a combination of small volume subarachnoid blood products and artifact.     CT Head 06/27/17 :  1. Evolving infarct along the posterior margin of the intraparenchymal hematoma site. Increased cytotoxic edema there since 06/24/2017 but no significant mass effect. 2. Stable to mildly regressed intracranial hemorrhage; intraparenchymal, intraventricular, and extra-axial. 3. Postoperative changes including continued pneumocephalus.  CT HEAD 07/07/17 : 1. Large acute or early subacute infarct in the inferior division left MCA territory and left basal ganglia. 2. Diminishing intracranial hemorrhage as described. Stable ventricular volume. PHYSICAL EXAM Elderly obese Caucasian male  who is currently is  Not in distress .  Afebrile. Head is nontraumatic. Neck is supple without bruit.    Cardiac exam no murmur or gallop. Lungs are clear to auscultation. Distal pulses are well felt. Neurological Exam ; sleepy but  eyes open partially to sternal rub.right gaze preference and partial left gaze palsy. Left hemianopsia. Mild right left confusion.  .  Able to move all 4 extremity   against gravity but left side less than the right. Pupils small irregular but reactive. Fundi could not be visualized. Mild left lower facial weakness. Tongue midline. Tone appears to be normal bilaterally. Both plantars are downgoing.  ASSESSMENT/PLAN Randy Durham is a 83 y.o. male with history of CAD, CKD, HLD, HTN, hypothyroidism and spinal stenosis who presented to the ED with somnolence and headache since Tuesday morning.  He did not receive IV t-PA due to ICH.   Stroke: Large R temporal IPH with extension to R lateral, 3rd, and 4th ventricle with mild associated cytotoxic edema, and no hydrocephalus, in the setting of moderate hypertension and anticoagulation with Eliquis and aspirin, and possible amyloid angiopathy. Status post craniotomy for hematoma evacuation on 06/24/17 Embolic left MCA inferior division infarct with cytotoxic edema suspect 5 days ago- afib off anticoagulation due to recent ICH  Resultant  Global apahsia CT head:  Cytotoxic edema in the inferior division left MCA territory,There is small volume subarachnoid hemorrhage, greatest along the posterior left cerebral convexity. Small volume intraventricular hemorrhage. Intracranial hemorrhage has diminished from prior. Less mass effect at the posterior right cerebral hematoma evacuation site which is predominately low-density. Incidental small low-density collection along the bone flap.  affecting a large volume  MRI head 06/24/17(POD#0). Small volume acute ischemia posterior to surgical cavity, small right and tiny left subdural fluid collections without significant  mass effect, small volume subarachnoid blood products versus artifact.  MRA head: not ordered  CTA head/neck 06/24/17: No vascular abnormality to account for the intraparenchymal hemorrhage  2D Echo: not ordered   LDL 70  HgbA1c 5.6  SCDs for VTE prophylaxis DIET - DYS 1 Room service appropriate? Yes; Fluid consistency: Thin  aspirin 81 mg daily and Eliquis (apixaban) daily prior to admission, now on aspirin 81 mg daily  Patient counseled to be compliant with his antithrombotic medications  Ongoing aggressive stroke risk factor management  Therapy recommendations:   CLR Disposition:   snf   I have personally examined this patient, reviewed notes, independently viewed imaging studies, participated in medical decision making and plan of care.ROS completed by me personally and pertinent positives fully documented  I have made any additions or clarifications directly to the above note. The patient presents a challenging high-risk situation. He developed hemorrhagic infarct on eliquis and now after discontinuing eliquis has developed an embolic nonhemorrhagic infarct while he was on aspirin alone. I had a long discussion the patient's, daughter about risk-benefit of initiating anticoagulation so soon after a brain hemorrhage and he understand the risk and would like to resume eliquis. Recommend eliquis 5 mg twice daily alone and discontinue aspirin.  Continue ongoing therapies as per rehabilitation team. The patient is unlikely to participate in aggressive inpatient rehabilitation and may need transfer to a nursing facility unless the patient's family is willing to consider palliative care options. Discussed with Dr. Hermelinda Medicus in the rehabilitation team stroke team will sign off. Kindly call for questions. Greater than 50% time during this 25 minute visit was spent on counseling and coordination of care about his intracerebral  hemorrhage, atrial fibrillation, embolic stroke and discussion about  risk-benefit of starting  eliquis back.  Delia Heady, MD Medical Director [facility] Pager: [phone] 07/08/2017 4:02 PM      Delia Heady, MD Medical Director [facility] Pager: [phone] 07/08/2017 4:02 PM   To contact Stroke Continuity provider, please refer to [email or web address]. After hours, contact General Neurology

## 2017-07-08 NOTE — Progress Notes (Signed)
Physical Therapy Session Note  Patient Details  Name: Randy Durham MRN: [id number] Date of Birth: 05-27-1934  Today's Date: 07/08/2017 PT Individual Time: [id number] PT Individual Time Calculation (min): 60 min   Short Term Goals: Week 2:  PT Short Term Goal 1 (Week 2): Pt will perform rolling in bed L<>R with hospital bed features and max assist +1.  PT Short Term Goal 2 (Week 2): Pt will transfer supine<>sitting EOB with max assist +1.  PT Short Term Goal 3 (Week 2): Pt will transfer bed<>chair with max assist +1.  Skilled Therapeutic Interventions/Progress Updates:  Pt received in bed with only brief donned & noted to be incontinent of BM. Pt requires total assist for rolling L<>R with manual facilitation for initiation, weight shifting, and sequencing. Therapist performed peri care total assist and during which pt had incontinent urine episode. Pt requires total assist for donning clean brief & gown. Transferred pt bed>[facility] and transported him to day room via w/c total assist. Attempted to have pt engage in activities such as catching a beach ball, applying lotion to arms, and visually locating himself in mirror. Pt unable to initiate applying lotion even with hand over hand assistance. Pt also unable to acknowledge beach ball being tossed to him or locate himself in mirror. Pt also with negative blink to threat reflex but unsure whether this is due to impaired attention or vision. At end of session pt left sitting in TIS w/c in room with family present to supervise and seat belt donned.  Family (son & wife) present at end of session - educated them on stroke recovery, risk for another stroke, and answered other various questions regarding pt's CLOF.   Therapy Documentation Precautions:  Precautions Precautions: Fall Restrictions Weight Bearing Restrictions: No   See Function Navigator for Current Functional Status.   Therapy/Group: Individual  Therapy  Sandi MariscalVictoria M Leba Tibbitts 07/08/2017, 3:52 PM

## 2017-07-09 ENCOUNTER — Inpatient Hospital Stay (HOSPITAL_COMMUNITY): Payer: Medicare Other | Admitting: Speech Pathology

## 2017-07-09 ENCOUNTER — Inpatient Hospital Stay (HOSPITAL_COMMUNITY): Payer: Medicare Other | Admitting: Physical Therapy

## 2017-07-09 ENCOUNTER — Inpatient Hospital Stay (HOSPITAL_COMMUNITY): Payer: Medicare Other | Admitting: Occupational Therapy

## 2017-07-09 DIAGNOSIS — I63412 Cerebral infarction due to embolism of left middle cerebral artery: Secondary | ICD-10-CM

## 2017-07-09 NOTE — Patient Care Conference (Signed)
Inpatient RehabilitationTeam Conference and Plan of Care Update Date: 07/07/2017   Time: 2:10 PM    Patient Name: Randy Durham      Medical Record Number: [id number]  Date of Birth: 13-Dec-1933 Sex: Male         Room/Bed: 4W15C/4W15C-01 Payor Info: Payor: MEDICARE / Plan: MEDICARE PART A AND B / Product Type: *No Product type* /    Admitting Diagnosis: Crani  Admit Date/Time:  06/30/2017  5:15 PM Admission Comments: No comment available   Primary Diagnosis:  Hemorrhage of right temporal lobe (HCC) Principal Problem: Hemorrhage of right temporal lobe [facility](HCC)  Patient Active Problem List   Diagnosis Date Noted  . Vascular headache   . Lethargy   . AKI (acute kidney injury) (HCC)   . Benign essential HTN   . Somnolence   . Intracerebral hemorrhage 06/24/2017  . Hemorrhage of right temporal lobe (HCC) 06/24/2017  . Intraventricular hemorrhage (HCC) 06/24/2017  . Cytotoxic brain edema (HCC) 06/24/2017  . Paroxysmal atrial fibrillation (HCC) 03/30/2015  . Bradycardia 03/27/2015  . NSVT (nonsustained ventricular tachycardia) (HCC) 03/27/2015  . CAD- occluded distal LAD, 75-80% CFX- medical Rx 03/27/2015  . NSTEMI 03/25/15 03/25/2015  . Chronic kidney disease (CKD), stage III (moderate)   . Obesity (BMI 30-39.9)   . Spinal stenosis   . Hypertension   . Hyperlipidemia     Expected Discharge Date: Expected Discharge Date:  (TBD)  Team Members Present: Physician leading conference: Dr. Faith RogueZachary Swartz Social Worker Present: Staci AcostaJenny Cillian Gwinner, LCSW Nurse Present: Other (comment) Aubery Lapping(Denise Lloyd, RN) PT Present: Aleda GranaVictoria Miller, PT OT Present: Callie FieldingKatie Pittman, OT SLP Present: Feliberto Gottronourtney Payne, SLP PPS Coordinator present : Tora DuckMarie Noel, RN, CRRN     Current Status/Progress Goal Weekly Team Focus  Medical   right occipital parietal hemorrhage with edema, pt with left MCA infarct likely embolic over the last few days. pt back on eliquis  mazimize participation and engagement with therapy   stroke/neuro work up, decision regarding a/c, family discussions   Bowel/Bladder   incont of B/B; LBM 9/3  pt will have at least 1 continent bladder episode per shift  monitor for changes in B/B status   Swallow/Nutrition/ Hydration   Dys. 1 textures with thin liquids, Max A  Min A  tolerance of current diet    ADL's   dependent overall, limited time of eyes open if at all, maxi sky for functional transfer  supervision grooming and UB self care with min A overall  alertness, cognition, orientation, endurance, pt/family education   Mobility   dependent overall, transfers bed<>chair with maxi sky lift  min assist overall  alertness, orientation, following one step commands, cognitive remediation, activity tolerance, family ed   Communication   Total A  Mod A  expression of wants/needs, following basic commands    Safety/Cognition/ Behavioral Observations  Total A   Mod A  initiation, attention, problem solving    Pain   c/o HA; topamax  <2 out of 10  assess for pain q shift and prn   Skin   scattered bruising; R crani w/ staples  free of breakdown  assess skin q shift    Rehab Goals Patient on target to meet rehab goals: No Rehab Goals Revised: Pt's goals may need to be downgraded due to pt's condition/functional status. *See Care Plan and progress notes for long and short-term goals.     Barriers to Discharge  Current Status/Progress Possible Resolutions Date Resolved   Physician    Medical stability  pt re-started on anticoagulation with the known risk factors      Nursing                  PT  Inaccessible home environment;Home environment access/layout;Incontinence;Behavior  pt has a full flight of steps to enter home, pt unable to follow one step commands 2/2 impaired cognition, significantly limited by lethargy              OT                  SLP Medical stability              SW                Discharge Planning/Teaching Needs:  Pt's family open to team's  recommendation for d/c disposition plan.  Home with care vs. SNF, depending on pt's progress.  Pt's family is at bedside regularly, but available for family education if plan is for pt to return home.   Team Discussion:  Pt with right occipital hemorrhage and now new left infarct.  Dr. Riley Kill and therapy team trying to see if pt would perk up and be able to tolerate CIR, but pt's progress has been inhibited by medical complications.  Dr. Pearlean Brownie has been consulted for recommendations.  Pt initiated a little in the shower with OT and woke up slightly.  PT is using maxi move or stedy to get pt up.  He remains difficult to awake, but when awake, is maintaining his sitting balance better.  Pt was upgraded to D2 diet with ST, but back to D1 and thin liquids due to lack of arousal.  Family is encouraging and involved.  Pt had trouble finding things visually on day of conference.  Revisions to Treatment Plan:  Pt made 15/7 for therapy scheduling.    Continued Need for Acute Rehabilitation Level of Care: The patient requires daily medical management by a physician with specialized training in physical medicine and rehabilitation for the following conditions: Daily direction of a multidisciplinary physical rehabilitation program to ensure safe treatment while eliciting the highest outcome that is of practical value to the patient.: Yes Daily medical management of patient stability for increased activity during participation in an intensive rehabilitation regime.: Yes Daily analysis of laboratory values and/or radiology reports with any subsequent need for medication adjustment of medical intervention for : Neurological problems  Aariyah Sampey, Vista Deck 07/09/2017, 11:09 AM

## 2017-07-09 NOTE — Progress Notes (Signed)
Occupational Therapy Weekly Progress Note  Patient Details  Name: Randy Durham MRN: [id number] Date of Birth: 1934-05-13  Beginning of progress report period: July 01, 2017 End of progress report period: July 09, 2017  Today's Date: 07/09/2017 OT Individual Time: 1100-1201 OT Individual Time Calculation (min): 61 min    Patient has met 0 of 4 short term goals. Pt making minimal progress towards OT goals this week. Pt very lethargic throughout therapy sessions and significant cognitive deficits. Pt requires +2 assistance for self care and functional transfer tasks at this time.   Patient continues to demonstrate the following deficits: muscle weakness, decreased cardiorespiratoy endurance, impaired timing and sequencing, unbalanced muscle activation, decreased coordination and decreased motor planning, decreased visual acuity, decreased initiation, decreased attention, decreased awareness, decreased problem solving, decreased safety awareness, decreased memory and delayed processing and decreased sitting balance, decreased standing balance and decreased balance strategies and therefore will continue to benefit from skilled OT intervention to enhance overall performance with Reduce care partner burden.  Patient not progressing toward long term goals.  See goal revision..  Plan of care revisions: downgraded to overall max A. .  OT Short Term Goals Week 1:  OT Short Term Goal 1 (Week 1): Pt will maintain attention and alertness for 10 min to participate in bathing. OT Short Term Goal 1 - Progress (Week 1): Not met OT Short Term Goal 2 (Week 1): Pt will be able to bring a cup to his mouth with min A. OT Short Term Goal 2 - Progress (Week 1): Not met OT Short Term Goal 3 (Week 1): Pt will be able to bathe UB with mod-max A.  OT Short Term Goal 3 - Progress (Week 1): Not met OT Short Term Goal 4 (Week 1): Pt will be able to sit unsupported for 10 min with mod A to progress to sitting on a  BSC. OT Short Term Goal 4 - Progress (Week 1): Not met Week 2:  OT Short Term Goal 1 (Week 2): Pt will initiate once self care task within 5 minutes. OT Short Term Goal 2 (Week 2): Pt will perform UB bathing with max A. OT Short Term Goal 3 (Week 2): Pt will sit on EOB for 10 minutes of static sitting with mod A for balance.  Skilled Therapeutic Interventions/Progress Updates:    Upon entering the room, pt supine in bed with family present who reports pt just having BM prior to therapist arrival. Therapist assists pt with donning LB clothing with +2 assistance for rolling L <> R and clothing management. Pt transferred into wheelchair via maxi sky for safety. UB dressing from wheelchair but pt unable to initiate tasks. Pt internally distracted throughout session and talking to self with words that do not make sense in current context. OT attempting to engage pt in ball toss in which he did hold onto but was unable to catch or throw with increased time and max multimodal cues. Pt remained in wheelchair at end of session tilted back and with seat belt donned. Pt at RN station for safety.   Therapy Documentation Precautions:  Precautions Precautions: Fall Restrictions Weight Bearing Restrictions: No General:   Vital Signs:   Pain:   ADL: ADL ADL Comments: refer to functional navigator  See Function Navigator for Current Functional Status.   Therapy/Group: Individual Therapy  Gypsy Decant 07/09/2017, 12:50 PM

## 2017-07-09 NOTE — Progress Notes (Signed)
Social Work Patient ID: Randy Durham, male   DOB: 01-17-1934, 83 y.o.   MRN: [id number]   Randy Durham, Randy Dates, LCSW Social Worker Signed   Patient Care Conference Date of Service: 07/09/2017 11:09 AM      Hide copied text Hover for attribution information Inpatient RehabilitationTeam Conference and Plan of Care Update Date: 07/07/2017   Time: 2:10 PM      Patient Name: Randy Durham      Medical Record Number: [id number]  Date of Birth: [birth year] Sex: Male         Room/Bed: 4W15C/4W15C-01 Payor Info: Payor: MEDICARE / Plan: MEDICARE PART A AND B / Product Type: *No Product type* /     Admitting Diagnosis: Crani  Admit Date/Time:  06/30/2017  5:15 PM Admission Comments: No comment available    Primary Diagnosis:  Hemorrhage of right temporal lobe (HCC) Principal Problem: Hemorrhage of right temporal lobe [facility])       Patient Active Problem List    Diagnosis Date Noted  . Vascular headache    . Lethargy    . AKI (acute kidney injury) (HCC)    . Benign essential HTN    . Somnolence    . Intracerebral hemorrhage 06/24/2017  . Hemorrhage of right temporal lobe (HCC) 06/24/2017  . Intraventricular hemorrhage (HCC) 06/24/2017  . Cytotoxic brain edema (HCC) 06/24/2017  . Paroxysmal atrial fibrillation (HCC) 03/30/2015  . Bradycardia 03/27/2015  . NSVT (nonsustained ventricular tachycardia) (HCC) 03/27/2015  . CAD- occluded distal LAD, 75-80% CFX- medical Rx 03/27/2015  . NSTEMI 03/25/15 03/25/2015  . Chronic kidney disease (CKD), stage III (moderate)    . Obesity (BMI 30-39.9)    . Spinal stenosis    . Hypertension    . Hyperlipidemia        Expected Discharge Date: Expected Discharge Date:  (TBD)   Team Members Present: Physician leading conference: Dr. Faith Rogue Social Worker Present: Randy Acosta, LCSW Nurse Present: Other (comment) Randy Lapping, RN) PT Present: Randy Durham, PT OT Present: Randy Durham, OT SLP Present: Randy Durham, SLP PPS Coordinator  present : Randy Duck, RN, CRRN       Current Status/Progress Goal Weekly Team Focus  Medical     right occipital parietal hemorrhage with edema, pt with left MCA infarct likely embolic over the last few days. pt back on eliquis  mazimize participation and engagement with therapy  stroke/neuro work up, decision regarding a/c, family discussions   Bowel/Bladder     incont of B/B; LBM 9/3  pt will have at least 1 continent bladder episode per shift  monitor for changes in B/B status   Swallow/Nutrition/ Hydration     Dys. 1 textures with thin liquids, Max A  Min A  tolerance of current diet    ADL's     dependent overall, limited time of eyes open if at all, maxi sky for functional transfer  supervision grooming and UB self care with min A overall  alertness, cognition, orientation, endurance, pt/family education   Mobility     dependent overall, transfers bed<>chair with maxi sky lift  min assist overall  alertness, orientation, following one step commands, cognitive remediation, activity tolerance, family ed   Communication     Total A  Mod A  expression of wants/needs, following basic commands    Safety/Cognition/ Behavioral Observations   Total A   Mod A  initiation, attention, problem solving    Pain     c/o HA; topamax  <2 out of 10  assess for pain q shift and prn   Skin     scattered bruising; R crani w/ staples  free of breakdown  assess skin q shift     Rehab Goals Patient on target to meet rehab goals: No Rehab Goals Revised: Pt's goals may need to be downgraded due to pt's condition/functional status. *See Care Plan and progress notes for long and short-term goals.      Barriers to Discharge   Current Status/Progress Possible Resolutions Date Resolved   Physician     Medical stability        pt re-started on anticoagulation with the known risk factors      Nursing                 PT  Inaccessible home environment;Home environment access/layout;Incontinence;Behavior   pt has a full flight of steps to enter home, pt unable to follow one step commands 2/2 impaired cognition, significantly limited by lethargy              OT                 SLP Medical stability            SW              Discharge Planning/Teaching Needs:  Pt's family open to team's recommendation for d/c disposition plan.  Home with care vs. SNF, depending on pt's progress.  Pt's family is at bedside regularly, but available for family education if plan is for pt to return home.   Team Discussion:  Pt with right occipital hemorrhage and now new left infarct.  Dr. Riley KillSwartz and therapy team trying to see if pt would perk up and be able to tolerate CIR, but pt's progress has been inhibited by medical complications.  Dr. Pearlean BrownieSethi has been consulted for recommendations.  Pt initiated a little in the shower with OT and woke up slightly.  PT is using maxi move or stedy to get pt up.  He remains difficult to awake, but when awake, is maintaining his sitting balance better.  Pt was upgraded to D2 diet with ST, but back to D1 and thin liquids due to lack of arousal.  Family is encouraging and involved.  Pt had trouble finding things visually on day of conference.  Revisions to Treatment Plan:  Pt made 15/7 for therapy scheduling.    Continued Need for Acute Rehabilitation Level of Care: The patient requires daily medical management by a physician with specialized training in physical medicine and rehabilitation for the following conditions: Daily direction of a multidisciplinary physical rehabilitation program to ensure safe treatment while eliciting the highest outcome that is of practical value to the patient.: Yes Daily medical management of patient stability for increased activity during participation in an intensive rehabilitation regime.: Yes Daily analysis of laboratory values and/or radiology reports with any subsequent need for medication adjustment of medical intervention for : Neurological  problems   Randy Durham, Randy DeckJennifer Durham 07/09/2017, 11:09 AM

## 2017-07-09 NOTE — Plan of Care (Signed)
Problem: RH Balance Goal: LTG: Patient will maintain dynamic sitting balance (OT) LTG:  Patient will maintain dynamic sitting balance with assistance during activities of daily living (OT)  Downgraded secondary to slow progress Goal: LTG Patient will maintain dynamic standing with ADLs (OT) LTG:  Patient will maintain dynamic standing balance with assist during activities of daily living (OT)   Downgraded secondary to slow progress  Problem: RH Eating Goal: LTG Patient will perform eating w/assist, cues/equip (OT) LTG: Patient will perform eating with assist, with/without cues using equipment (OT)  Downgraded secondary to slow progress  Problem: RH Grooming Goal: LTG Patient will perform grooming w/assist,cues/equip (OT) LTG: Patient will perform grooming with assist, with/without cues using equipment (OT)  Downgraded secondary to slow progress  Problem: RH Bathing Goal: LTG Patient will bathe with assist, cues/equipment (OT) LTG: Patient will bathe specified number of body parts with assist with/without cues using equipment (position)  (OT)  Downgraded secondary to slow progress  Problem: RH Dressing Goal: LTG Patient will perform upper body dressing (OT) LTG Patient will perform upper body dressing with assist, with/without cues (OT).  Downgraded secondary to slow progress Goal: LTG Patient will perform lower body dressing w/assist (OT) LTG: Patient will perform lower body dressing with assist, with/without cues in positioning using equipment (OT)  Downgraded secondary to slow progress  Problem: RH Toileting Goal: LTG Patient will perform toileting w/assist, cues/equip (OT) LTG: Patient will perform toiletiing (clothes management/hygiene) with assist, with/without cues using equipment (OT)  Downgraded secondary to slow progress  Problem: RH Toilet Transfers Goal: LTG Patient will perform toilet transfers w/assist (OT) LTG: Patient will perform toilet transfers with assist,  with/without cues using equipment (OT)  Downgraded secondary to slow progress  Problem: RH Attention Goal: LTG Patient will demonstrate focused/sustained (OT) LTG:  Patient will demonstrate focused/sustained/selective/alternating/divided attention during functional activities in specific environment with assist for # of minutes  (OT)  Downgraded secondary to slow progress

## 2017-07-09 NOTE — Progress Notes (Signed)
Order received to discontinue staples on right side of scalp. Patient tolerated procedure well with no complaints of pain. Patient resting in bed with family at bedside.

## 2017-07-09 NOTE — Progress Notes (Addendum)
Physical Therapy Session Note  Patient Details  Name: Randy Durham MRN: [id number] Date of Birth: 05/07/1934  Today's Date: 07/09/2017 PT Individual Time: [id number] PT Individual Time Calculation (min): 74 min   Short Term Goals: Week 2:  PT Short Term Goal 1 (Week 2): Pt will perform rolling in bed L<>R with hospital bed features and max assist +1.  PT Short Term Goal 2 (Week 2): Pt will transfer supine<>sitting EOB with max assist +1.  PT Short Term Goal 3 (Week 2): Pt will transfer bed<>chair with max assist +1.  Skilled Therapeutic Interventions/Progress Updates:  Pt received in w/c. During session pt with intermittent behaviors noting pain - therapist repositioned pt in w/c and RN administered meds during session. Therapist spent significant time educating pt's son Randy Fickle(Paul) and wife Randy Catholic(Lucille) on pt's current level of function, recovery following stroke, and answering other questions to their satisfaction. Pt then assisted to day room via w/c total assist where therapist provided total assist for eating lunch; pt able to open mouth to take a bite when spoon presented to his lips. Pt attempting to verbalize during session but no meaningful sentences/conversation noted. Pt assisted back to room where therapist attempted to have pt wash his face with hand over hand assist but pt unable to initiate or participate task. Pt returned to bed via maxisky for safety and once in bed pt began fidgeting with brief, unable to verbalize needs, then had incontinent void. Pt required dependent assist for rolling in bed, peri hygiene, and brief & clothing management (shorts & gown). At end of session pt left in bed with family present to supervise, bed to the floor, and 4 rails up.  Therapy Documentation Precautions:  Precautions Precautions: Fall Restrictions Weight Bearing Restrictions: No   See Function Navigator for Current Functional Status.   Therapy/Group: Individual Therapy  Sandi MariscalVictoria M  Kelsea Mousel 07/09/2017, 3:33 PM

## 2017-07-09 NOTE — Progress Notes (Signed)
[place] PHYSICAL MEDICINE & REHABILITATION     PROGRESS NOTE    Subjective/Complaints: Had a fair night. Pt seems restless in bed this morning. Wife thinks his back is bothering him.   ROS: limited due to language/communication   Objective: Vital Signs: Blood pressure 129/69, pulse 65, temperature 98 F (36.7 C), temperature source Axillary, resp. rate 16, height 5\' 3"  (1.6 m), weight 84.9 kg (187 lb 1.6 oz), SpO2 98 %. Ct Head Wo Contrast  Result Date: 07/07/2017 CLINICAL DATA:  Followup intracranial hemorrhage. EXAM: CT HEAD WITHOUT CONTRAST TECHNIQUE: Contiguous axial images were obtained from the base of the skull through the vertex without intravenous contrast. COMPARISON:  06/28/2017 FINDINGS: Brain: Cytotoxic edema in the inferior division left MCA territory, affecting a large volume. This extends from the lateral temporal lobe to the parietal cortex. There is also a band of infarct across the basal ganglia and anterior limb internal capsule. There is small volume subarachnoid hemorrhage, greatest along the posterior left cerebral convexity. Small volume intraventricular hemorrhage. Intracranial hemorrhage has diminished from prior. Less mass effect at the posterior right cerebral hematoma evacuation site which is predominately low-density. Incidental small low-density collection along the bone flap. Vascular: There is a background of cerebral atrophy and chronic small vessel ischemia. Skull: Unremarkable right parietal craniotomy site. Sinuses/Orbits: Negative Other: These results were called by telephone at the time of interpretation on 07/07/2017 at 11:44 am to Dr. Faith RogueZACHARY Estrella Alcaraz , who verbally acknowledged these results. IMPRESSION: 1. Large acute or early subacute infarct in the inferior division left MCA territory and left basal ganglia. 2. Diminishing intracranial hemorrhage as described. Stable ventricular volume. Electronically Signed   By: Marnee SpringJonathon  Watts M.D.   On: 07/07/2017  11:44   No results for input(s): WBC, HGB, HCT, PLT in the last 72 hours.  Recent Labs  07/07/17 0924  NA 139  K 3.9  CL 106  GLUCOSE 126*  BUN 24*  CREATININE 1.24  CALCIUM 9.4   CBG (last 3)  No results for input(s): GLUCAP in the last 72 hours.  Wt Readings from Last 3 Encounters:  07/09/17 84.9 kg (187 lb 1.6 oz)  06/30/17 81.6 kg (180 lb)  03/25/17 82.1 kg (181 lb)    Physical Exam:  Constitutional: No distress. Vital signs reviewed HENT: Normocephalic.  Eyes: opens eyes intermittently Cardiovascular: RRR without murmur. No JVD   Respiratory:  CTA Bilaterally without wheezes or rales. Normal effort     GI: Bowel sounds are normal. He exhibits no distension. Skin: Skin is warm and dry. Craniotomy site clean and dry Neurological: more alert. Aphasic exp/rec Moves both sides, left more than right  Psych:  pleasant  Assessment/Plan: 1. Functional and cognitive deficits secondary to right temporal ICH which require 3+ hours per day of interdisciplinary therapy in a comprehensive inpatient rehab setting. Physiatrist is providing close team supervision and 24 hour management of active medical problems listed below. Physiatrist and rehab team continue to assess barriers to discharge/monitor patient progress toward functional and medical goals.  Function:  Bathing Bathing position   Position: Shower  Bathing parts   Body parts bathed by helper: Right arm, Left arm, Chest, Abdomen, Front perineal area, Buttocks, Right upper leg, Left upper leg, Right lower leg, Left lower leg, Back  Bathing assist Assist Level: 2 helpers      Upper Body Dressing/Undressing Upper body dressing   What is the patient wearing?: Pull over shirt/dress       Pull over shirt/dress - Perfomed  by helper: Thread/unthread right sleeve, Thread/unthread left sleeve, Put head through opening, Pull shirt over trunk        Upper body assist Assist Level:  (total)      Lower Body  Dressing/Undressing Lower body dressing   What is the patient wearing?: Pants, Shoes       Pants- Performed by helper: Thread/unthread right pants leg, Thread/unthread left pants leg, Pull pants up/down   Non-skid slipper socks- Performed by helper: Don/doff right sock, Don/doff left sock       Shoes - Performed by helper: Don/doff right shoe, Don/doff left shoe       TED Hose - Performed by helper: Don/doff right TED hose, Don/doff left TED hose  Lower body assist Assist for lower body dressing: 2 Helpers      Toileting Toileting Toileting activity did not occur: No continent bowel/bladder event   Toileting steps completed by helper: Adjust clothing prior to toileting, Performs perineal hygiene, Adjust clothing after toileting    Toileting assist Assist level: Two helpers   Transfers Chair/bed transfer   Chair/bed transfer method: Other Chair/bed transfer assist level: 2 helpers Chair/bed transfer assistive device: Systems developer lift: Air traffic controller Ambulation activity did not occur: Safety/medical concerns (fatigue)   Max distance: 20 ft Assist level: 2 helpers   Education administrator activity did not occur: Safety/medical concerns     Assist Level: Dependent (Pt equals 0%)  Cognition Comprehension Comprehension assist level: Understands basic less than 25% of the time/ requires cueing >75% of the time  Expression Expression assist level: Expresses basis less than 25% of the time/requires cueing >75% of the time.  Social Interaction Social Interaction assist level: Interacts appropriately 25 - 49% of time - Needs frequent redirection.  Problem Solving Problem solving assist level: Solves basic less than 25% of the time - needs direction nearly all the time or does not effectively solve problems and may need a restraint for safety  Memory Memory assist level: Recognizes or recalls less than 25% of the time/requires cueing greater than  75% of the time   Medical Problem List and Plan: 1. Decreased functional mobilitysecondary to right temporal ICH with IVH status post craniotomy for hematoma evacuation 06/24/2017 -continue therapies and observe tolerance. Family heavily involved and realistic 2. DVT Prophylaxis/Anticoagulation: Subcutaneous heparin for DVT prophylaxis initiated 06/29/2017 3. Pain Management/back pain: Discontinue hydrocodone due to somnolence.   Monitor mental status  Topamax started 9/2---changed to HS 4. Mood:dc amantadine---change to ritalin beginning at noon today 5. Neuropsych: This patient is notcapable of making decisions on hisown behalf. -continue sleep chart -limiting neuro-sedating medications. off Seroquel  -stimulate during day with ritalin  -ua/ucx with multiple species. 6. Skin/Wound Care: Routine skin checks 7. Fluids/Electrolytes/Nutrition: encourage PO as possible     -cal ct  -AKI, Bun/Cr trending up, encourage fluids   -follow up labs Friday 8.Seizure prophylaxis. Valproate 500 mg every 12 hours. EEG negative  -VPA level 55 on 8/29 9.CAD with cardiac catheterization 03/26/2015. Continue low-dose aspirin. 10.Hypertension/PAF. Lisinopril 5 mg daily, Lopressor 12.5 mg twice a day.   Norvasc 2.5 started on 9/1 to limit renal effects  BP controlled at present 11.Hyperlipidemia. Lipitor 12.Hypothyroidism. Synthroid. Follow up levels as outpt.    LOS (Days) 9 A FACE TO FACE EVALUATION WAS PERFORMED  [name], MD 07/09/2017 9:09 AM

## 2017-07-09 NOTE — Plan of Care (Signed)
Problem: RH Balance Goal: LTG Patient will maintain dynamic standing balance (PT) LTG:  Patient will maintain dynamic standing balance with assistance during mobility activities (PT)  Downgrade 2/2 slow progress  Problem: RH Bed Mobility Goal: LTG Patient will perform bed mobility with assist (PT) LTG: Patient will perform bed mobility with assistance, with/without cues (PT).  Downgrade 2/2 slow progress  Problem: RH Bed to Chair Transfers Goal: LTG Patient will perform bed/chair transfers w/assist (PT) LTG: Patient will perform bed/chair transfers with assistance, with/without cues (PT).  Downgrade 2/2 slow progress  Problem: RH Car Transfers Goal: LTG Patient will perform car transfers with assist (PT) LTG: Patient will perform car transfers with assistance (PT).  Downgrade 2/2 slow progress  Problem: RH Ambulation Goal: LTG Patient will ambulate in controlled environment (PT) LTG: Patient will ambulate in a controlled environment, # of feet with assistance (PT).  D/c goal - not appropriate at this time  Problem: RH Wheelchair Mobility Goal: LTG Patient will propel w/c in controlled environment (PT) LTG: Patient will propel wheelchair in controlled environment, # of feet with assist (PT)  D/c goal - not appropriate at this time

## 2017-07-09 NOTE — Progress Notes (Signed)
Social Work Patient ID: Randy Durham, male   DOB: 1934/05/15, 83 y.o.   MRN: [id number]   CSW met with pt's wife and son [name]) to update them on team conference discussion.  They admitted to [place] that they do not know what is best for pt as far as a discharge plan.  Family is open to palliative care team coming to assess pt and help them to determine goals of care.  Dr. Leonie Man will also make recommendations.  If pt has a chance for some meaningful recovery, then they want to pursue SNF.  If the rehab (CIR or SNF) is too hard for pt and he is not expected to recover much, then they would be willing to look at hospice care.  CSW updated [name], PA and he will consult PCT and pass on Dr. Clydene Fake recommendations to family as we receive them.  CSW gave pt's family the SNF list and they will begin to look into options.  Family is appreciative of care pt has received.  CSW will continue to be available to assist as needed.

## 2017-07-09 NOTE — Progress Notes (Signed)
Speech Language Pathology Daily Session Note  Patient Details  Name: Randy Durham MRN: [id number] Date of Birth: 07/09/1934  Today's Date: 07/09/2017 SLP Individual Time: 0730-0800 SLP Individual Time Calculation (min): 30 min  Short Term Goals: Week 2: SLP Short Term Goal 1 (Week 2): Patient will consume current diet with minimal overt s/s of aspiration with Min A verbal cues for use of swallowing compensatory strategies.  SLP Short Term Goal 2 (Week 2): Pt will demonstrate sustained attention to functional task for 2 minutes with Max multimodal cues. SLP Short Term Goal 3 (Week 2): Pt will initate basic familar ADL tasks in 30% of opportunties with Max multimodal cues. SLP Short Term Goal 4 (Week 2): Pt will answer yes/no questions with 30% accuracy with Max A multimodal cues. SLP Short Term Goal 5 (Week 2): Pt will communicate basic wants and needs via multimodal communciation with Max A verbal cues.  Skilled Therapeutic Interventions: Skilled treatment session focused on cognitive and dysphagia goals. Upon arrival, patient was awake while upright in bed. SLP facilitated session by providing Max A-Total A for self-feeding of ~50% of thin liquids. Patient consumed meal of Dys. 1 textures with thin liquids without overt s/s of aspiration but demonstrated prolonged AP transit with pureed textures. Recommend patient continue current diet and only eat when he is awake/alert. Patient with increased verbal responses today characterized by increased verbalizations and vocalization in response to questions in regards to wants/needs (~50%) with 1 instance of automatic response. Patient left upright in bed with family present. Continue with current plan of care.        Function:  Eating Eating   Modified Consistency Diet: Yes Eating Assist Level: Set up assist for;Hand over hand assist   Eating Set Up Assist For: Opening containers Helper Scoops Food on Utensil: Every scoop      Cognition Comprehension Comprehension assist level: Understands basic less than 25% of the time/ requires cueing >75% of the time  Expression   Expression assist level: Expresses basis less than 25% of the time/requires cueing >75% of the time.  Social Interaction Social Interaction assist level: Interacts appropriately 25 - 49% of time - Needs frequent redirection.  Problem Solving Problem solving assist level: Solves basic less than 25% of the time - needs direction nearly all the time or does not effectively solve problems and may need a restraint for safety  Memory Memory assist level: Recognizes or recalls less than 25% of the time/requires cueing greater than 75% of the time    Pain Indications of back pain and headache. RN aware and patient premedicated   Therapy/Group: Individual Therapy  [name] 07/09/2017, 9:09 AM

## 2017-07-10 ENCOUNTER — Inpatient Hospital Stay (HOSPITAL_COMMUNITY): Payer: Medicare Other | Admitting: Speech Pathology

## 2017-07-10 ENCOUNTER — Inpatient Hospital Stay (HOSPITAL_COMMUNITY): Payer: Medicare Other | Admitting: Occupational Therapy

## 2017-07-10 ENCOUNTER — Inpatient Hospital Stay (HOSPITAL_COMMUNITY): Payer: Medicare Other | Admitting: Physical Therapy

## 2017-07-10 DIAGNOSIS — Z515 Encounter for palliative care: Secondary | ICD-10-CM

## 2017-07-10 NOTE — Progress Notes (Signed)
Physical Therapy Session Note  Patient Details  Name: Randy Durham MRN: [id number] Date of Birth: November 08, 1933  Today's Date: 07/10/2017 PT Individual Time: 0830-0930 PT Individual Time Calculation (min): 60 min   Short Term Goals: Week 2:  PT Short Term Goal 1 (Week 2): Pt will perform rolling in bed L<>R with hospital bed features and max assist +1.  PT Short Term Goal 2 (Week 2): Pt will transfer supine<>sitting EOB with max assist +1.  PT Short Term Goal 3 (Week 2): Pt will transfer bed<>chair with max assist +1.  Skilled Therapeutic Interventions/Progress Updates:  Pt received in bed. Pt requires dependent assistance for rolling L<>R for sling placement and pt transferred bed>TIS w/c via maxisky for safety. Attempted to have pt wash face with cloth but pt unable to initiate despite [facility] assistance. In day room, attempted to have pt engage in tasks focusing on following one step commands. Pt unable to locate music when therapist played music from computer nor unable to recognize when therapist calls him by name. Pt unable to locate himself in mirror or reach for objects in front of him. Pt required multiple trials of HOH assist for donning glasses before pt was able to initiate task. Propelled pt around unit in w/c dependently to increase alertness. At end of session pt left sitting in TIS w/c in handoff to SLP.  Therapy Documentation Precautions:  Precautions Precautions: Fall Restrictions Weight Bearing Restrictions: No  See Function Navigator for Current Functional Status.   Therapy/Group: Individual Therapy  Sandi MariscalVictoria M Laurieann Friddle 07/10/2017, 9:43 AM

## 2017-07-10 NOTE — Progress Notes (Signed)
Speech Language Pathology Daily Session Note  Patient Details  Name: Randy Durham MRN: [id number] Date of Birth: 08-May-1934  Today's Date: 07/10/2017 SLP Individual Time: 0930-1020 SLP Individual Time Calculation (min): 50 min and Today's Date: 07/10/2017 SLP Missed Time: 10 Minutes Missed Time Reason: Patient fatigue  Short Term Goals: Week 2: SLP Short Term Goal 1 (Week 2): Patient will consume current diet with minimal overt s/s of aspiration with Min A verbal cues for use of swallowing compensatory strategies.  SLP Short Term Goal 2 (Week 2): Pt will demonstrate sustained attention to functional task for 2 minutes with Max multimodal cues. SLP Short Term Goal 3 (Week 2): Pt will initate basic familar ADL tasks in 30% of opportunties with Max multimodal cues. SLP Short Term Goal 4 (Week 2): Pt will answer yes/no questions with 30% accuracy with Max A multimodal cues. SLP Short Term Goal 5 (Week 2): Pt will communicate basic wants and needs via multimodal communciation with Max A verbal cues.  Skilled Therapeutic Interventions: Skilled treatment session focused on cognitive goals. SLP facilitated session by providing total A for initiation of self-feeding of liquids. Patient with minimal scanning or focused attention to auditory stimuli throughout session and remained essentially nonverbal. Patient became restless and almost frantic like while pulling at his gown and was incontinent of urine. Patient [facility]. Patient's son present and asking questions in regards to patient's current cognitive-linguistic function and prognosis for recovery. SLP answered all questions at this time. Patient asleep in bed, therefore, patient missed remaining 10 minutes of session. Continue with current plan of care.      Function:    Cognition Comprehension Comprehension assist level: Understands basic less than 25% of the time/ requires cueing >75% of the time   Expression   Expression assist level: Expresses basis less than 25% of the time/requires cueing >75% of the time.  Social Interaction Social Interaction assist level: Interacts appropriately 25 - 49% of time - Needs frequent redirection.  Problem Solving Problem solving assist level: Solves basic less than 25% of the time - needs direction nearly all the time or does not effectively solve problems and may need a restraint for safety  Memory Memory assist level: Recognizes or recalls less than 25% of the time/requires cueing greater than 75% of the time    Pain Pain Assessment Faces Pain Scale: No hurt  Therapy/Group: Individual Therapy  Sathvik Tiedt 07/10/2017, 4:54 PM

## 2017-07-10 NOTE — Progress Notes (Signed)
[place] PHYSICAL MEDICINE & REHABILITATION     PROGRESS NOTE    Subjective/Complaints: More comfortable. Alert. Slept last night.   ROS: limited due to language/communication   Objective: Vital Signs: Blood pressure 133/69, pulse 62, temperature 97.6 F (36.4 C), temperature source Oral, resp. rate 17, height  (1.6 m), weight 84.4 kg (186 lb), SpO2 100 %. No results found. No results for input(s): WBC, HGB, HCT, PLT in the last 72 hours. No results for input(s): NA, K, CL, GLUCOSE, BUN, CREATININE, CALCIUM in the last 72 hours.  Invalid input(s): CO CBG (last 3)  No results for input(s): GLUCAP in the last 72 hours.  Wt Readings from Last 3 Encounters:  07/10/17 84.4 kg (186 lb)  06/30/17 81.6 kg (180 lb)  03/25/17 82.1 kg (181 lb)    Physical Exam:  Constitutional: No distress. Vital signs reviewed HENT: Normocephalic.  Eyes: opens eyes intermittently Cardiovascular: RRR without murmur. No JVD    Respiratory:  CTA Bilaterally without wheezes or rales. Normal effort      GI: Bowel sounds are normal. He exhibits no distension. Skin: Skin is warm and dry. Craniotomy site clean and dry Neurological:   alert. Aphasic exp/rec Moves both sides, left more than right  Psych:  pleasant  Assessment/Plan: 1. Functional and cognitive deficits secondary to right temporal ICH which require 3+ hours per day of interdisciplinary therapy in a comprehensive inpatient rehab setting. Physiatrist is providing close team supervision and 24 hour management of active medical problems listed below. Physiatrist and rehab team continue to assess barriers to discharge/monitor patient progress toward functional and medical goals.  Function:  Bathing Bathing position   Position: Shower  Bathing parts   Body parts bathed by helper: Right arm, Left arm, Chest, Abdomen, Front perineal area, Buttocks, Right upper leg, Left upper leg, Right lower leg, Left lower leg, Back  Bathing assist  Assist Level: 2 helpers      Upper Body Dressing/Undressing Upper body dressing   What is the patient wearing?: Pull over shirt/dress       Pull over shirt/dress - Perfomed by helper: Thread/unthread right sleeve, Thread/unthread left sleeve, Put head through opening, Pull shirt over trunk        Upper body assist Assist Level: 2 helpers      Lower Body Dressing/Undressing Lower body dressing   What is the patient wearing?: Pants, Non-skid slipper socks       Pants- Performed by helper: Thread/unthread right pants leg, Thread/unthread left pants leg, Pull pants up/down   Non-skid slipper socks- Performed by helper: Don/doff right sock, Don/doff left sock       Shoes - Performed by helper: Don/doff right shoe, Don/doff left shoe       TED Hose - Performed by helper: Don/doff right TED hose, Don/doff left TED hose  Lower body assist Assist for lower body dressing: 2 Helpers      Toileting Toileting Toileting activity did not occur: No continent bowel/bladder event   Toileting steps completed by helper: (P) Adjust clothing prior to toileting, Performs perineal hygiene, Adjust clothing after toileting    Toileting assist Assist level: (P) Two helpers   Transfers Chair/bed transfer   Chair/bed transfer method: Other Chair/bed transfer assist level: 2 helpers Chair/bed transfer assistive device: Systems developer lift: Air traffic controller Ambulation activity did not occur: Safety/medical concerns (fatigue)   Max distance: 20 ft Assist level: 2 helpers   Education administrator activity did not occur: Safety/medical concerns  Assist Level: Dependent (Pt equals 0%)  Cognition Comprehension Comprehension assist level: Understands basic less than 25% of the time/ requires cueing >75% of the time  Expression Expression assist level: Expresses basis less than 25% of the time/requires cueing >75% of the time.  Social Interaction Social  Interaction assist level: Interacts appropriately 25 - 49% of time - Needs frequent redirection.  Problem Solving Problem solving assist level: Solves basic less than 25% of the time - needs direction nearly all the time or does not effectively solve problems and may need a restraint for safety  Memory Memory assist level: Recognizes or recalls less than 25% of the time/requires cueing greater than 75% of the time   Medical Problem List and Plan: 1. Decreased functional mobilitysecondary to right temporal ICH with IVH status post craniotomy for hematoma evacuation 06/24/2017 -observing for activity tolerance/participation  -more alert but profound cognitive/ 2. DVT Prophylaxis/Anticoagulation: Subcutaneous heparin for DVT prophylaxis initiated 06/29/2017 3. Pain Management/back pain: Discontinue hydrocodone due to somnolence.   Monitor mental status  Topamax started 9/2---changed to HS schedule 4. Mood:dc amantadine---change to ritalin beginning at noon today 5. Neuropsych: This patient is notcapable of making decisions on hisown behalf. -continue sleep chart -limiting neuro-sedating medications. off Seroquel  -stimulate during day with ritalin  -ua/ucx with multiple species. 6. Skin/Wound Care: Routine skin checks 7. Fluids/Electrolytes/Nutrition: encourage PO as possible     -cal ct  -AKI, Bun/Cr continue to  trend up, encourage fluids   -re-check monday 8.Seizure prophylaxis. Valproate 500 mg every 12 hours. EEG negative  -VPA level 55 on 8/29 9.CAD with cardiac catheterization 03/26/2015. Continue low-dose aspirin. 10.Hypertension/PAF. Lisinopril 5 mg daily, Lopressor 12.5 mg twice a day.   Norvasc 2.5 started on 9/1 to limit renal effects  BP controlled at present 11.Hyperlipidemia. Lipitor 12.Hypothyroidism. Synthroid. Follow up levels as outpt.    LOS (Days) 10 A FACE TO FACE EVALUATION WAS PERFORMED  Ranelle OysterSWARTZ,Leomia Blake T,  MD 07/10/2017 9:24 AM

## 2017-07-10 NOTE — Progress Notes (Signed)
Occupational Therapy Session Note  Patient Details  Name: Randy Durham MRN: [id number] Date of Birth: [birth year]  Today's Date: 07/10/2017 OT Individual Time: [id number] OT Individual Time Calculation (min): 30 min  and Today's Date: 07/10/2017 OT Missed Time: 15 Minutes Missed Time Reason: Patient fatigue   Short Term Goals: Week 2:  OT Short Term Goal 1 (Week 2): Pt will initiate once self care task within 5 minutes. OT Short Term Goal 2 (Week 2): Pt will perform UB bathing with max A. OT Short Term Goal 3 (Week 2): Pt will sit on EOB for 10 minutes of static sitting with mod A for balance.  Skilled Therapeutic Interventions/Progress Updates:    Pt seen for OT session focusing on functional sitting balance and attention to task. Pt in supine upon arrival with family members present. Pt's son reporting that pt acting as if he needed to urinate. Therefore transitioned pt to sitting EOB with +2 assist. Pt able to maintain static sitting balance EOB and attempted voicing from upright sitting position. Pt unable to attend to task and despite hand over hand assist to have pt manage hand held urinal, pt unable to locate or attend to task and pt unable to void after several minutes. Seated EOB, addressed functional initiation with self- feeding task. Pt unable to attend to R UE, therefore used L hand to manage spoon. Max A to maintain functional grasp on spoon. Once spoon taken ~75% of the way towards mouth pt able to take remainder of way to mouth to self feed. Completed this ~5 times with pt maintaining static sitting balance with close supervision. As pt fatigued, he began to have R lean, unable to self correct with multimodal cuing. Once pt unable to no longer maintain balance, he transitioned back to supine position. Attempted grooming tasks in supine, with hand over hand assist to initiate, however, pt unable to follow through with task and did not attend to/ track to therapist on R.  Pt left in  supine to rest at end of session, family members aware of position as they had exited to family room.   Therapy Documentation Precautions:  Precautions Precautions: Fall Restrictions Weight Bearing Restrictions: No Pain:   No/ denies pain ADL: ADL ADL Comments: refer to functional navigator  See Function Navigator for Current Functional Status.   Therapy/Group: Individual Therapy  Lewis, Damarri Rampy C 07/10/2017, 7:23 AM

## 2017-07-10 NOTE — Consult Note (Signed)
Consultation Note Date: 07/10/2017   Patient Name: Randy Durham  DOB: 12-Jul-1934  MRN: [id number]  Age / Sex: 83 y.o., male  PCP: Seward Carol, MD Referring Physician: Meredith Staggers, MD  Reason for Consultation: Establishing goals of care and Psychosocial/spiritual support  HPI/Patient Profile: 83 y.o. male  with past medical history of Spinal stenosis, coronary artery disease/non-STEMI/PAF (on Eliquis), hypertension, chronic kidney disease stage III admitted on 06/30/2017 with complaints of a headache, altered mental status, right-sided weakness. Upon admission, patient was found to have a large right temporal intraparenchymal hemorrhage with intraventricular extension and surrounding edema. Patient underwent a craniotomy for evacuation on 06/24/2017. His  Eliquis was discontinued because of intracranial hemorrhage but low-dose aspirin was initiated. . Patient was admitted to the inpatient rehabilitation program and had been progressing until approximately a week ago when he sustained a left embolic MCA stroke  Consult for goals of care  Clinical Assessment and Goals of Care: Met with patient, patient's wife and son Randy Durham. Randy Durham describes a consistent decline since 07/02/2017. Patient is not conversant however he does appear to be talking to himself, reliving previous life experiences in terms of work, organizing a family reunion; some words that he speaks to his family are gibberish per wife and son. They are not clear at this time if he recognizes them. They do understand that he is not able to progress in as robust of rehabilitation program that is offered inpatient at [facility]. At this point they are still wanting to "give him every chance possible", and her are pursuing plans to transfer to a skilled nursing facility for rehab with the goal to have some meaningful recovery.   Patient is unable to make his own  healthcare decisions this point. His wife, Randy Durham, is his healthcare proxy; he also has 2 sons and 1 daughter. They're making decisions as a family  In addition to life review, defining palliative medicine versus hospice, also began CODE STATUS conversations. Patient's wife, and son were unfamiliar with terms, full code versus DO NOT RESUSCITATE. Family feels that patient would not want to live debilitated are in a nursing home    SUMMARY OF RECOMMENDATIONS   Full code for now. Patient has advance directives which reference "no heroic measures", family leaning towards DO NOT RESUSCITATE but this is not confirmed yet Palliative medicine to meet with family on 07/12/2017 at 1 PM Given Hard Choices for [employer] booklet as well as to MOST form to review Family interested in pursuing skilled nursing facility for skilled days, rehabilitation. Would recommend palliative medicine in the community to follow-up for continued goals of care Code Status/Advance Care Planning:  Full code    Symptom Management:   Fatigue/lethargy: Continue Ritalin twice daily  Seizures: Continue Topamax as well as Depakote  Palliative Prophylaxis:   Aspiration, Bowel Regimen, Delirium Protocol, Eye Care, Frequent Pain Assessment, Oral Care and Turn Reposition  Additional Recommendations (Limitations, Scope, Preferences):  Full Scope Treatment  Psycho-social/Spiritual:   Desire for further Chaplaincy support:no  Additional Recommendations: Referral  to [employer]   Prognosis:   Unable to determine  Discharge Planning: [place] for rehab with Palliative care service follow-up      Primary Diagnoses: Present on Admission: . Hemorrhage of right temporal lobe ([place]) . Chronic kidney disease (CKD), stage III (moderate) . Somnolence . Cytotoxic brain edema ([place])   I have reviewed the medical record, interviewed the patient and family, and examined the patient. The following  aspects are pertinent.  Past Medical History:  Diagnosis Date  . Anemia   . CAD (coronary artery disease)    Cath 03/26/2015 100% distal LAD stenosis, 80% ostial D1 stenosis, 75% ramus stenosis, 60% RPDA stenosis, 30% proximal RCA stenosis. Medical therapy, outpt myoview to assess LCx lesion  . Chronic kidney disease (CKD), stage III (moderate)   . Hyperlipidemia   . Hypertension   . Hypothyroidism   . Obesity (BMI 30-39.9)   . Spinal stenosis   . Thyroid nodule    Social History   Social History  . Marital status: Married    Spouse name: N/A  . Number of children: N/A  . Years of education: N/A   Social History Main Topics  . Smoking status: Former Smoker    Packs/day: 0.25    Types: Cigars, Pipe  . Smokeless tobacco: Never Used  . Alcohol use 0.0 oz/week     Comment: rarely  . Drug use: No  . Sexual activity: Not Asked   Other Topics Concern  . None   Social History Narrative   Retired Programme researcher, broadcasting/film/video for [employer] History  Problem Relation Age of Onset  . Heart attack Father 72       cause of death  . Prostate cancer Father   . Cancer Sister   . Heart disease Sister    Scheduled Meds: . acetaminophen  650 mg Oral Q6H  . amLODipine  2.5 mg Oral Daily  . aspirin  81 mg Oral Daily  . atorvastatin  80 mg Oral q1800  . feeding supplement (ENSURE ENLIVE)  237 mL Oral TID BM  . feeding supplement (PRO-STAT SUGAR FREE 64)  30 mL Oral Daily  . heparin subcutaneous  5,000 Units Subcutaneous Q8H  . levothyroxine  112 mcg Per Tube QAC breakfast  . lisinopril  5 mg Oral QHS  . methylphenidate  5 mg Oral BID WC  . metoprolol tartrate  12.5 mg Oral BID  . topiramate  25 mg Oral Daily  . Valproate Sodium  500 mg Oral Q12H   Continuous Infusions: PRN Meds:.acetaminophen **OR** acetaminophen (TYLENOL) oral liquid 160 mg/5 mL **OR** acetaminophen, nitroGLYCERIN, ondansetron **OR** ondansetron (ZOFRAN) IV, sorbitol Medications Prior to Admission:  Prior to  Admission medications   Medication Sig Start Date End Date Taking? Authorizing Provider  acetaminophen (TYLENOL) 325 MG tablet Take 325 mg by mouth every 6 (six) hours as needed (pain).   Yes [provider]  aspirin EC 81 MG tablet Take 81 mg by mouth daily.   Yes [provider]  atorvastatin (LIPITOR) 80 MG tablet Take 1 tablet (80 mg total) by mouth daily at 6 PM. 03/28/15  Yes Almyra Deforest, PA  fluticasone (FLONASE) 50 MCG/ACT nasal spray Place 1 spray into both nostrils daily as needed (seasonal allergies).   Yes [provider]  levothyroxine (SYNTHROID, LEVOTHROID) 112 MCG tablet Take 112 mcg by mouth daily before breakfast.   Yes [provider]  lisinopril (PRINIVIL,ZESTRIL) 10 MG tablet Take 1 tablet (10 mg total) by  mouth at bedtime. 03/28/15  Yes Meng, Isaac Laud, PA  Menthol-Zinc Oxide (GOLD BOND EX) Apply 1 application topically at bedtime.   Yes [provider]  metoprolol tartrate (LOPRESSOR) 25 MG tablet Take 0.5 tablets (12.5 mg total) by mouth 2 (two) times daily. 03/28/15  Yes Almyra Deforest, PA  Multiple Vitamins-Minerals (CENTRUM SILVER PO) Take 1 tablet by mouth daily.   Yes [provider]  nitroGLYCERIN (NITROSTAT) 0.4 MG SL tablet Place 1 tablet (0.4 mg total) under the tongue every 5 (five) minutes x 3 doses as needed for chest pain. 07/23/16  Yes Burnell Blanks, MD  omeprazole (PRILOSEC) 20 MG capsule Take 20 mg by mouth daily.   Yes [provider]  sildenafil (VIAGRA) 50 MG tablet Take 50 mg by mouth daily as needed for erectile dysfunction. Take 1 hour prior to sexual activity ( do not exceed 1 dose per 24 hour period)    Yes [provider]  tamsulosin (FLOMAX) 0.4 MG CAPS capsule Take 0.4 mg by mouth daily.   Yes [provider]   No Known Allergies Review of Systems  Unable to perform ROS: Mental status change    Physical Exam  Constitutional: He appears well-developed and well-nourished.    Cardiovascular:  tachy  Pulmonary/Chest: Effort normal.  Neurological:  Fluctuating level of consciousness  Skin: Skin is warm and dry. There is pallor.  Psychiatric:  Unable to test  Nursing note and vitals reviewed.   Vital Signs: BP (!) 141/77 (BP Location: Left Arm)   Pulse 69   Temp 97.9 F (36.6 C) (Axillary)   Resp 18   Ht 5' 3"  (1.6 m)   Wt 84.4 kg (186 lb)   SpO2 100%   BMI 32.95 kg/m  Pain Assessment: No/denies pain   Pain Score: 0-No pain   SpO2: SpO2: 100 % O2 Device:SpO2: 100 % O2 Flow Rate: .   IO: Intake/output summary:  Intake/Output Summary (Last 24 hours) at 07/10/17 1622 Last data filed at 07/10/17 0830  Gross per 24 hour  Intake              220 ml  Output                0 ml  Net              220 ml    LBM: Last BM Date: 07/09/17 Baseline Weight: Weight: 81.6 kg (179 lb 14.3 oz) Most recent weight: Weight: 84.4 kg (186 lb)     Palliative Assessment/Data:   Flowsheet Rows     Most Recent Value  Intake Tab  Referral Department  Hospitalist  Unit at Time of Referral  Other (Comment)  Palliative Care Primary Diagnosis  Neurology  Date Notified  07/09/17  Palliative Care Type  New Palliative care  Reason for referral  Clarify Goals of Care, Counsel Regarding Hospice  Date of Admission  06/24/17  Date first seen by Palliative Care  07/10/17  # of days Palliative referral response time  1 Day(s)  # of days IP prior to Palliative referral  15  Clinical Assessment  Palliative Performance Scale Score  30%  Pain Max last 24 hours  Not able to report  Pain Min Last 24 hours  Not able to report  Dyspnea Max Last 24 Hours  Not able to report  Dyspnea Min Last 24 hours  Not able to report  Nausea Max Last 24 Hours  Not able to report  Nausea Min Last  24 Hours  Not able to report  Anxiety Max Last 24 Hours  Not able to report  Anxiety Min Last 24 Hours  Not able to report  Other Max Last 24 Hours  Not able to report  Psychosocial & Spiritual  Assessment  Palliative Care Outcomes  Patient/Family meeting held?  Yes  Who was at the meeting?  pt's wife and son, Randy Durham  Palliative Care follow-up planned  Yes, Facility      Time In: 1430 Time Out: 1545 Time Total: 75 min Greater than 50%  of this time was spent counseling and coordinating care related to the above assessment and plan.  Signed by: Dory Horn, NP   Please contact Palliative Medicine Team phone at [phone] for questions and concerns.  For individual provider: See Shea Evans

## 2017-07-11 ENCOUNTER — Inpatient Hospital Stay (HOSPITAL_COMMUNITY): Payer: Medicare Other | Admitting: Occupational Therapy

## 2017-07-11 ENCOUNTER — Inpatient Hospital Stay (HOSPITAL_COMMUNITY): Payer: Medicare Other

## 2017-07-11 ENCOUNTER — Inpatient Hospital Stay (HOSPITAL_COMMUNITY): Payer: Medicare Other | Admitting: Speech Pathology

## 2017-07-11 MED ORDER — ORAL CARE MOUTH RINSE
15.0000 mL | Freq: Two times a day (BID) | OROMUCOSAL | Status: DC
  Administered 2017-07-11 – 2017-07-22 (×18): 15 mL via OROMUCOSAL

## 2017-07-11 NOTE — Progress Notes (Signed)
Patient ID: Randy Durham, male   DOB: 1934-01-06, 83 y.o.   MRN: [id number]   07/11/17.  Randy Durham is a 83 y.o. male  Admit for CIR with  Decreased functional mobilitysecondary to right temporal ICH with IVH status post craniotomy for hematoma evacuation 06/24/2017  Past Medical History:  Diagnosis Date  . Anemia   . CAD (coronary artery disease)    Cath 03/26/2015 100% distal LAD stenosis, 80% ostial D1 stenosis, 75% ramus stenosis, 60% RPDA stenosis, 30% proximal RCA stenosis. Medical therapy, outpt myoview to assess LCx lesion  . Chronic kidney disease (CKD), stage III (moderate)   . Hyperlipidemia   . Hypertension   . Hypothyroidism   . Obesity (BMI 30-39.9)   . Spinal stenosis   . Thyroid nodule      Subjective: Somnolent; no verbal response.  Son at bedside  Objective: Vital signs in last 24 hours: Temp:  [97.9 F (36.6 C)-98.6 F (37 C)] 98.6 F (37 C) (09/08 0500) Pulse Rate:  [66-75] 66 (09/08 0500) Resp:  [18] 18 (09/08 0500) BP: (130-149)/(77-79) 130/79 (09/08 0500) SpO2:  [99 %-100 %] 99 % (09/08 0500) Weight:  [170 lb (77.1 kg)] 170 lb (77.1 kg) (09/08 0500) Weight change: -16 lb (-7.258 kg) Last BM Date: 07/09/17  Intake/Output from previous day: 09/07 0701 - 09/08 0700 In: 340 [P.O.:340] Out: -   BP Readings from Last 3 Encounters:  07/11/17 130/79  06/30/17 (!) 132/95  03/25/17 130/80    Physical Exam General: No apparent distress   HEENT: not dry Lungs: Normal effort. Lungs clear to auscultation, no crackles or wheezes. Cardiovascular: Regular rate and rhythm, no edema Abdomen: S/NT/ND; BS(+) Musculoskeletal:  unchanged Neurological: No new neurological deficits. Somnolent Wounds: N/A    Extremities. No edema Mental state: Alert, oriented, cooperative    Lab Results: BMET    Component Value Date/Time   NA 139 07/07/2017 0924   NA 140 03/25/2017 0914   K 3.9 07/07/2017 0924   CL 106 07/07/2017 0924   CO2 21 (L) 07/07/2017  0924   GLUCOSE 126 (H) 07/07/2017 0924   BUN 24 (H) 07/07/2017 0924   BUN 19 03/25/2017 0914   CREATININE 1.24 07/07/2017 0924   CALCIUM 9.4 07/07/2017 0924   GFRNONAA 53 (L) 07/07/2017 0924   GFRAA >60 07/07/2017 0924   CBC    Component Value Date/Time   WBC 7.8 07/01/2017 0534   RBC 4.75 07/01/2017 0534   HGB 13.5 07/01/2017 0534   HGB 15.8 03/25/2017 0914   HCT 40.0 07/01/2017 0534   HCT 46.4 03/25/2017 0914   PLT 291 07/01/2017 0534   PLT 224 03/25/2017 0914   MCV 84.2 07/01/2017 0534   MCV 86 03/25/2017 0914   MCH 28.4 07/01/2017 0534   MCHC 33.8 07/01/2017 0534   RDW 13.2 07/01/2017 0534   RDW 14.2 03/25/2017 0914   LYMPHSABS 1.2 07/01/2017 0534   LYMPHSABS 1.5 03/25/2017 0914   MONOABS 0.8 07/01/2017 0534   EOSABS 0.2 07/01/2017 0534   EOSABS 0.1 03/25/2017 0914   BASOSABS 0.0 07/01/2017 0534   BASOSABS 0.0 03/25/2017 0914    Studies/Results: No results found.  Medications: I have reviewed the patient's current medications.  Assessment/Plan:  Decreased functional mobilitysecondary to right temporal ICH with IVH status post craniotomy for hematoma evacuation 06/24/2017 HTN- stable CAD. Continue ASA, statin Hypothyroidism. Continue Synthroid     Length of stay, days: 11  [name],[name] , MD 07/11/2017, 10:43 AM

## 2017-07-11 NOTE — Progress Notes (Signed)
Occupational Therapy Session Note  Patient Details  Name: Randy Durham MRN: [id number] Date of Birth: Mar 11, 1934  Today's Date: 07/11/2017 OT Individual Time: 0900-1025 OT Individual Time Calculation (min): 85 min    Short Term Goals: Week 1:  OT Short Term Goal 1 (Week 1): Pt will maintain attention and alertness for 10 min to participate in bathing. OT Short Term Goal 1 - Progress (Week 1): Not met OT Short Term Goal 2 (Week 1): Pt will be able to bring a cup to his mouth with min A. OT Short Term Goal 2 - Progress (Week 1): Not met OT Short Term Goal 3 (Week 1): Pt will be able to bathe UB with mod-max A.  OT Short Term Goal 3 - Progress (Week 1): Not met OT Short Term Goal 4 (Week 1): Pt will be able to sit unsupported for 10 min with mod A to progress to sitting on a BSC. OT Short Term Goal 4 - Progress (Week 1): Not met Week 2:  OT Short Term Goal 1 (Week 2): Pt will initiate once self care task within 5 minutes. OT Short Term Goal 2 (Week 2): Pt will perform UB bathing with max A. OT Short Term Goal 3 (Week 2): Pt will sit on EOB for 10 minutes of static sitting with mod A for balance.  Skilled Therapeutic Interventions/Progress Updates:    Focus of treatment was bed mobility, sitting balance, standing balance, attention, therapeutic activities, sustained attention, postural control.  Pt lying in bed with eyes closed.  Performed arousal techniques; rolled to right and left to don pants.  Rolled to right and left with total assist.  Rolled to right to sit EOB with placement and son assisted +2.   After sitting up,  Ppt opened eyes and spoke garbled speech.  He held cup with min assist and drank from straw for multiple 9-10 times. Rest of feeding and drinking was max assist.     Went from sit to stand with +2 and stood for 30 sec.  Scooted to [facility] with max assist.  Ppt leaned forward to initiate sit to stand.    Son, Randy Durham, very helpful with assistance as needed.  Pt Sat EOB with min  assist for 45 minutes with trunk flexion at 30 degrees.  Sat EOB for 70 minutes for total time.   Next therapy after lunch, so returned pt to supine.  Needed total assist for sit to supine.   Therapy Documentation   Precautions:  Precautions Precautions: Fall Restrictions Weight Bearing Restrictions: No Pain:  None indicated   ADL: ADL ADL Comments: refer to functional navigator     See Function Navigator for Current Functional Status.   Therapy/Group: Individual Therapy  Lisa Roca 07/11/2017, 10:37 AM

## 2017-07-11 NOTE — Progress Notes (Signed)
Occupational Therapy Session Note  Patient Details  Name: Randy Durham MRN: [id number] Date of Birth: [birth year]  Today's Date: 07/11/2017 OT Individual Time: [id number] OT Individual Time Calculation (min): 45 min    Skilled Therapeutic Interventions/Progress Updates:    1:1. Pt alert upon arrival with family present in room. Pt presented with hospital gown and able to attend to L sleeve and "punch" arm through sleeve, but unable to attend to do the same on R. Pt transitioned supine>EOB with total A. Pt maintain sitting EOB with supervision-min A as OT attempted to engage pt in self feeding. Pt able to visually attend to spoon on table ~3 seconds on one occasion on R of tray with multimodal cueing. OT provided HOH A for pt to reach and grab spoon and complete hand to mouth excursion. Pt takes ~5 bites with max A and pt is unable to initiate movement despite cueing/increased time. Pt becoming restless fumbling at brief OT checked and pt incontinent of bladder. Pt sit to stand x4 at EOB with MAX A as +2 completes clothing managment and peri care. PT able to initiate stands with tactile cues for weight shifting and VC for initiation. Pt transitioned EOB>supine with total A and OT exited session with pt semi reclined in bed with call light in reach, family present and bed exit alarm on.   Therapy Documentation Precautions:  Precautions Precautions: Fall Restrictions Weight Bearing Restrictions: No  See Function Navigator for Current Functional Status.   Therapy/Group: Individual Therapy  Shon HaleStephanie M Laiyah Exline 07/11/2017, 5:48 PM

## 2017-07-11 NOTE — Progress Notes (Signed)
Occupational Therapy Session Note  Patient Details  Name: Randy Durham MRN: [id number] Date of Birth: Sep 20, 1934  Today's Date: 07/11/2017 OT Individual Time: 0800-0900 OT Individual Time Calculation (min): 60 min    Short Term Goals: Week 2:  OT Short Term Goal 1 (Week 2): Pt will initiate once self care task within 5 minutes. OT Short Term Goal 2 (Week 2): Pt will perform UB bathing with max A. OT Short Term Goal 3 (Week 2): Pt will sit on EOB for 10 minutes of static sitting with mod A for balance.  Skilled Therapeutic Interventions/Progress Updates: Patient lethargic and groggy and sleeping throughout the session.  Son present and assisted this clinician with trying to arouse [name].     Patient required Total A to attend to task to wash face and did not participate in the other therapeutic tasks.    This clinican and son changed patient's brief and bed bath.   Patient was not aroused to participate in the session.    He did speak in words inaudible to this clinician or son during this particular session.  Patient was left with supportive son supine in bed.   Son stated that next he was going to try to arouse his father and help him eat his breakfast.     Therapy Documentation Precautions:  Precautions Precautions: Fall Restrictions Weight Bearing Restrictions: No Pain:denied  See Function Navigator for Current Functional Status.   Therapy/Group: Individual Therapy  Bud Faceickett, Camyra Vaeth [facility] LLCYeary 07/11/2017, 12:10 PM

## 2017-07-12 ENCOUNTER — Inpatient Hospital Stay (HOSPITAL_COMMUNITY): Payer: Medicare Other

## 2017-07-12 ENCOUNTER — Inpatient Hospital Stay (HOSPITAL_COMMUNITY): Payer: Medicare Other | Admitting: Occupational Therapy

## 2017-07-12 LAB — URINALYSIS, ROUTINE W REFLEX MICROSCOPIC
Bilirubin Urine: NEGATIVE
GLUCOSE, UA: NEGATIVE mg/dL
KETONES UR: 5 mg/dL — AB
Nitrite: POSITIVE — AB
Specific Gravity, Urine: 1.024 (ref 1.005–1.030)
pH: 8 (ref 5.0–8.0)

## 2017-07-12 MED ORDER — SULFAMETHOXAZOLE-TRIMETHOPRIM 800-160 MG PO TABS
1.0000 | ORAL_TABLET | Freq: Two times a day (BID) | ORAL | Status: DC
  Administered 2017-07-12 – 2017-07-14 (×4): 1 via ORAL
  Filled 2017-07-12 (×4): qty 1

## 2017-07-12 NOTE — Progress Notes (Signed)
Daily Progress Note   Patient Name: Randy Durham       Date: 07/12/2017 DOB: [birth year]  Age: 83 y.o. MRN#: [id number] Attending Physician: Meredith Staggers, MD Primary Care Physician: Seward Carol, MD Admit Date: 06/30/2017  Reason for Consultation/Follow-up: Establishing goals of care and Psychosocial/spiritual support  Subjective: Patient continues to be very lethargic and unable to participate to any great extent in rehabilitation. He continues to be incontinent of bladder and bowel and dependent for all ADLs. Pt did stand on his own today during rehab per son  Length of Stay: 12  Current Medications: Scheduled Meds:  . acetaminophen  650 mg Oral Q6H  . amLODipine  2.5 mg Oral Daily  . aspirin  81 mg Oral Daily  . atorvastatin  80 mg Oral q1800  . feeding supplement (ENSURE ENLIVE)  237 mL Oral TID BM  . feeding supplement (PRO-STAT SUGAR FREE 64)  30 mL Oral Daily  . heparin subcutaneous  5,000 Units Subcutaneous Q8H  . levothyroxine  112 mcg Per Tube QAC breakfast  . lisinopril  5 mg Oral QHS  . mouth rinse  15 mL Mouth Rinse BID  . methylphenidate  5 mg Oral BID WC  . metoprolol tartrate  12.5 mg Oral BID  . topiramate  25 mg Oral Daily  . Valproate Sodium  500 mg Oral Q12H    Continuous Infusions:   PRN Meds: acetaminophen **OR** acetaminophen (TYLENOL) oral liquid 160 mg/5 mL **OR** acetaminophen, nitroGLYCERIN, ondansetron **OR** ondansetron (ZOFRAN) IV, sorbitol  Physical Exam  Constitutional: He appears well-developed and well-nourished.  Cardiovascular:  irrg  Pulmonary/Chest:  Weaned 4 hours this morning  Genitourinary:  Genitourinary Comments: foley  Neurological:  More alert.  Skin: Skin is warm and dry. There is pallor.  Nursing note and vitals  reviewed.           Vital Signs: BP (!) 155/68 (BP Location: Left Arm)   Pulse 65   Temp 97.6 F (36.4 C) (Axillary)   Resp 16   Ht 5' 3"  (1.6 m)   Wt 80.7 kg (178 lb)   SpO2 100%   BMI 31.53 kg/m  SpO2: SpO2: 100 % O2 Device: O2 Device: Not Delivered O2 Flow Rate:    Intake/output summary: No intake or output data in the 24 hours ending 07/12/17 1123  LBM: Last BM Date: 07/12/17 Baseline Weight: Weight: 81.6 kg (179 lb 14.3 oz) Most recent weight: Weight: 80.7 kg (178 lb)       Palliative Assessment/Data:    Flowsheet Rows     Most Recent Value  Intake Tab  Referral Department  Hospitalist  Unit at Time of Referral  Other (Comment)  Palliative Care Primary Diagnosis  Neurology  Date Notified  07/09/17  Palliative Care Type  New Palliative care  Reason for referral  Clarify Goals of Care, Counsel Regarding Hospice  Date of Admission  06/24/17  Date first seen by Palliative Care  07/10/17  # of days Palliative referral response time  1 Day(s)  # of days IP prior to Palliative referral  15  Clinical Assessment  Palliative Performance Scale Score  30%  Pain Max last 24 hours  Not able to report  Pain Min Last 24 hours  Not able to report  Dyspnea Max Last 24 Hours  Not able to report  Dyspnea Min Last 24 hours  Not able to report  Nausea Max Last 24 Hours  Not able to report  Nausea Min Last 24 Hours  Not able to report  Anxiety Max Last 24 Hours  Not able to report  Anxiety Min Last 24 Hours  Not able to report  Other Max Last 24 Hours  Not able to report  Psychosocial & Spiritual Assessment  Palliative Care Outcomes  Patient/Family meeting held?  Yes  Who was at the meeting?  pt's wife and son, [name]  Palliative Care follow-up planned  Yes, Facility      Patient Active Problem List   Diagnosis Date Noted  . Palliative care by specialist   . Vascular headache   . Lethargy   . AKI (acute kidney injury) ([place])   . Benign essential HTN   . Somnolence   .  Intracerebral hemorrhage 06/24/2017  . Hemorrhage of right temporal lobe ([place]) 06/24/2017  . Intraventricular hemorrhage ([place]) 06/24/2017  . Cytotoxic brain edema ([place]) 06/24/2017  . Paroxysmal atrial fibrillation ([place]) 03/30/2015  . Bradycardia 03/27/2015  . NSVT (nonsustained ventricular tachycardia) ([place]) 03/27/2015  . CAD- occluded distal LAD, 75-80% CFX- medical Rx 03/27/2015  . NSTEMI 03/25/15 03/25/2015  . Chronic kidney disease (CKD), stage III (moderate)   . Obesity (BMI 30-39.9)   . Spinal stenosis   . Hypertension   . Hyperlipidemia     Palliative Care Assessment & Plan   Patient Profile: 83 y.o. male  with past medical history of Spinal stenosis, coronary artery disease/non-STEMI/PAF (on Eliquis), hypertension, chronic kidney disease stage III admitted on 06/30/2017 with complaints of a headache, altered mental status, right-sided weakness. Upon admission, patient was found to have a large right temporal intraparenchymal hemorrhage with intraventricular extension and surrounding edema. Patient underwent a craniotomy for evacuation on 06/24/2017. His  Eliquis was discontinued because of intracranial hemorrhage but low-dose aspirin was initiated. . Patient was admitted to the inpatient rehabilitation program and had been progressing until approximately a week ago when he sustained a left embolic MCA stroke.  Unfortunately, patient has not been able to progress in inpatient rehabilitation program; remains lethargic, dependent for all ADLs, limited oral intake secondary to lethargy/encephalopathy   Assessment: Met with patient's wife, son, [name], emotional support offered. Family  continues to hope for improvement but are preparing themselves that that may not be the case. Son is reading Hard Choices Booklet but family still discussing code status as well as other [place]. [name] also  verbalizing concern that pt is in pain at times although he does feel tylenol scheduled is helping and HA's  are better  Recommendations/Plan:  PMT to continue to support pt and family holistically  Family likely to pursue SNF with rehab if decision to move from CIR arises  Pt remains full code by choice at this time  Pain: Cont scheduled tylenol; Topamax could be increased to address HA. Pt also has underlying chronic back pain as well  Goals of Care and Additional Recommendations:  Limitations on Scope of Treatment: Full Scope Treatment  Code Status:    Code Status Orders        Start     Ordered   06/30/17 1730  Full code  Continuous     06/30/17 1729    Code Status History    Date Active Date Inactive Code Status Order ID Comments User Context   06/30/2017  5:29 PM  Full Code [id number]  Cathlyn Parsons, PA-C Inpatient   06/24/2017 10:09 AM 06/30/2017  5:15 PM Full Code [id number]  Omar Person, NP Inpatient   06/24/2017  2:12 AM 06/24/2017 10:09 AM Full Code [id number]  Kerney Elbe, MD ED   03/26/2015  4:42 PM 03/28/2015  3:53 PM Full Code [id number]  Lorretta Harp, MD Inpatient   03/26/2015  2:11 AM 03/26/2015  4:42 PM Full Code [id number]  Jacolyn Reedy, MD Inpatient    Advance Directive Documentation     Most Recent Value  Type of Advance Directive  Healthcare Power of Attorney  Pre-existing out of facility DNR order (yellow form or pink MOST form)  -  "MOST" Form in Place?  -       Prognosis:  Pt likely would qualify for his hospice medicare benefit of less than 6 month if family was interested in pursuing this option. At this point, they are leaning towards more rehab.  Discharge Planning:  To Be Determined   Thank you for allowing the Palliative Medicine Team to assist in the care of this patient.   Time In: 1300 Time Out: 1340 Total Time 40 min Prolonged Time Billed  no       Greater than 50%  of this time was spent counseling and coordinating care related to the above assessment and plan.  Dory Horn, NP  Please contact Palliative  Medicine Team phone at [phone] for questions and concerns.

## 2017-07-12 NOTE — Progress Notes (Signed)
Occupational Therapy Session Note  Patient Details  Name: Randy Durham MRN: [id number] Date of Birth: 24-Dec-1933  Today's Date: 07/12/2017 OT Individual Time: 0900-1000 and 1335-1400 OT Individual Time Calculation (min): 60 min and 25 min   Short Term Goals: Week 2:  OT Short Term Goal 1 (Week 2): Pt will initiate once self care task within 5 minutes. OT Short Term Goal 2 (Week 2): Pt will perform UB bathing with max A. OT Short Term Goal 3 (Week 2): Pt will sit on EOB for 10 minutes of static sitting with mod A for balance.  Skilled Therapeutic Interventions/Progress Updates:    Session One: Pt seen for OT session focusing on attention to task and sit <> stand in prep for functional tasks. Pt in supine upon arrival with son present. Pt alert and appearing agreeable to tx session.  Donned brief and pants total A from bed level, rolling with max A with total A multimodal cuing and positioning for sequencing/ technique.  He transferred to EOB with total A +1, sitting EOB with close supervision. He completed sit > stand in STEDY from EOB with +2 assist, once in standing unable to get pt to sit back down, however, pt appearing restless, attempting to lift leg outside of STEDY. He completed mini squats in STEDY, though becoming unsafe due to restlessness and therefore transitioned him to tilt-in-space w/c. Attempted to engage pt in face washing task, however, despite multimodal cuing, demonstration, and hand over hand assist was unable to get him to initiate or follow through. In therapy gym, pt completed sit <> stand at side of parallel bar. Pt initially required +2 total A for sit>stand and standing balance, however, progressed to pt standing with one UE support and close supervision. Upon standing pt incontinent of bowel. Therefore returned to room and had pt stand in STEDY for clothing management and hygiene to be completed with +2 assist. Pt able to complete sit>stand in STEDY with min A on first  trial, however, requiring +2 for functional standing to clear buttock from lift in order to complete clothing management and hygiene.  Pt left sitting up in tilt-in-space at end of session, son present and stated he would alert RN if he needed to leave, RN made aware of pt's position.   Session Two: Pt seen for OT session focusing on task initiation and sit <> Stand. Pt reclined in tilt-in-space w/c upon arrival with family present.  While pt in upright position, attempted several bites of lunch. With built up gripper placed and utensil set-up with food, pt able to self feed with min-mod A with pt able to complete bringing utensil to mouth once initiated and min A to maintain grasp on utensil.  Completed sit>stand in STEDY with min A once given tactile cuing to initiate task. He required max cuing for sequencing ot tasks, however, was able to complete a controlled descent to EOB with guarding assist from STEDY. Pt found to be incontinent and required +2 assist for rolling, clothing management, and hygiene as pt grasping at groin when staff attempting to clean pt, requiring one person to manage his hands while second person completed hygiene and clothing management. Pt left in supine at end of session with family members present.  Pt's son present during session and was very excited and encouraged about pt's "good" day on therapy. Discussed importance of taking advantage of pt's good days, however, encouraged him to have plans for pt's "bad" days if they do decide to take pt  home.   Therapy Documentation Precautions:  Precautions Precautions: Fall Restrictions Weight Bearing Restrictions: No   See Function Navigator for Current Functional Status.   Therapy/Group: Individual Therapy  Lewis, Shalise Rosado C 07/12/2017, 6:51 AM

## 2017-07-12 NOTE — Progress Notes (Signed)
1519 07/12/17 Noted malodorous urine MD notified new order noted.

## 2017-07-12 NOTE — Progress Notes (Signed)
Physical Therapy Session Note  Patient Details  Name: Randy Durham MRN: [id number] Date of Birth: Apr 05, 1934  Today's Date: 07/12/2017 PT Individual Time: 1100-1202 PT Individual Time Calculation (min): 62 min   Short Term Goals: Week 2:  PT Short Term Goal 1 (Week 2): Pt will perform rolling in bed L<>R with hospital bed features and max assist +1.  PT Short Term Goal 2 (Week 2): Pt will transfer supine<>sitting EOB with max assist +1.  PT Short Term Goal 3 (Week 2): Pt will transfer bed<>chair with max assist +1.  Skilled Therapeutic Interventions/Progress Updates:    Pt seated in TIS w/c upon PT arrival, agreeable to therapy tx. Pt performed sit<>stand using the stedy with mod assist, +2 for safety in order to get pt changed due to report of BM. While standing in stedy x 6 minutes, PT donned/doffed shorts and briefs and cleaned peri area with total assist. Session focused on standing activity tolerance and attention. Transported to dayroom total assist in TIS w/c. Pt standing in standing frame x 12 minutes while attempting to have pt engage in tasks focusing on following one step commands. Pt  recognized when therapist calls him by name only 2 out of 8 times throughout session, attempted to have pt reach for bean bags, pt only able to hold onto bean bag when placed in his hand. Pt performed sit<>stand x 2 at the parallel bars with mod assist, tactile cues for upright posture. Pt left seated in TIS w/c at end of session with family present.  Therapy Documentation Precautions:  Precautions Precautions: Fall Restrictions Weight Bearing Restrictions: No   See Function Navigator for Current Functional Status.   Therapy/Group: Individual Therapy  Cresenciano GenreEmily van Schagen, PT, DPT 07/12/2017, 12:14 PM

## 2017-07-12 NOTE — Plan of Care (Signed)
Problem: RH BLADDER ELIMINATION Goal: RH STG MANAGE BLADDER WITH ASSISTANCE STG Manage Bladder With min Assistance   Outcome: Not Progressing Pt incontinent of urine. Total assistance to clean and change brief.

## 2017-07-12 NOTE — Plan of Care (Signed)
Problem: RH BLADDER ELIMINATION Goal: RH STG MANAGE BLADDER WITH ASSISTANCE STG Manage Bladder With min Assistance Outcome: Not Progressing incontinent     

## 2017-07-12 NOTE — Plan of Care (Signed)
Problem: RH BOWEL ELIMINATION Goal: RH STG MANAGE BOWEL WITH ASSISTANCE STG Manage Bowel with max Assistance.    Outcome: Progressing Pt remains incontinent of stool. Total dependence to clean and change brief.

## 2017-07-12 NOTE — Progress Notes (Signed)
Patient ID: Randy Lemonrimo Gitlin, male   DOB: 1934-03-12, 83 y.o.   MRN: [id number]   07/12/17.  Randy Durham is a 83 y.o. male Admit for CIR with  Decreased functional mobilitysecondary to right temporal ICH with IVH status post craniotomy for hematoma evacuation 06/24/2017.  Past Medical History:  Diagnosis Date  . Anemia   . CAD (coronary artery disease)    Cath 03/26/2015 100% distal LAD stenosis, 80% ostial D1 stenosis, 75% ramus stenosis, 60% RPDA stenosis, 30% proximal RCA stenosis. Medical therapy, outpt myoview to assess LCx lesion  . Chronic kidney disease (CKD), stage III (moderate)   . Hyperlipidemia   . Hypertension   . Hypothyroidism   . Obesity (BMI 30-39.9)   . Spinal stenosis   . Thyroid nodule      Subjective: Confused but more alert this am.  Son at bedside  Objective: Vital signs in last 24 hours: Temp:  [97.6 F (36.4 C)] 97.6 F (36.4 C) (09/09 0537) Pulse Rate:  [65-86] 65 (09/09 0537) Resp:  [16-18] 16 (09/09 0537) BP: (152-155)/(64-78) 155/68 (09/09 0537) SpO2:  [99 %-100 %] 100 % (09/09 0537) Weight:  [178 lb (80.7 kg)] 178 lb (80.7 kg) (09/09 0537) Weight change: 8 lb (3.629 kg) Last BM Date: 07/12/17  Intake/Output from previous day: No intake/output data recorded. Last cbgs: CBG (last 3)  No results for input(s): GLUCAP in the last 72 hours.   Physical Exam General: No apparent distress   HEENT: not dry Lungs: Normal effort. Lungs clear to auscultation, no crackles or wheezes. Cardiovascular: Regular rate and rhythm, no edema Abdomen: S/NT/ND; BS(+) Musculoskeletal:  unchanged Neurological: No new neurological deficits Wounds: N/A    Skin: clear  Mental state: confused, disoriented  BP Readings from Last 3 Encounters:  07/12/17 (!) 155/68  06/30/17 (!) 132/95  03/25/17 130/80    Lab Results: BMET    Component Value Date/Time   NA 139 07/07/2017 0924   NA 140 03/25/2017 0914   K 3.9 07/07/2017 0924   CL 106 07/07/2017 0924   CO2 21 (L) 07/07/2017 0924   GLUCOSE 126 (H) 07/07/2017 0924   BUN 24 (H) 07/07/2017 0924   BUN 19 03/25/2017 0914   CREATININE 1.24 07/07/2017 0924   CALCIUM 9.4 07/07/2017 0924   GFRNONAA 53 (L) 07/07/2017 0924   GFRAA >60 07/07/2017 0924   CBC    Component Value Date/Time   WBC 7.8 07/01/2017 0534   RBC 4.75 07/01/2017 0534   HGB 13.5 07/01/2017 0534   HGB 15.8 03/25/2017 0914   HCT 40.0 07/01/2017 0534   HCT 46.4 03/25/2017 0914   PLT 291 07/01/2017 0534   PLT 224 03/25/2017 0914   MCV 84.2 07/01/2017 0534   MCV 86 03/25/2017 0914   MCH 28.4 07/01/2017 0534   MCHC 33.8 07/01/2017 0534   RDW 13.2 07/01/2017 0534   RDW 14.2 03/25/2017 0914   LYMPHSABS 1.2 07/01/2017 0534   LYMPHSABS 1.5 03/25/2017 0914   MONOABS 0.8 07/01/2017 0534   EOSABS 0.2 07/01/2017 0534   EOSABS 0.1 03/25/2017 0914   BASOSABS 0.0 07/01/2017 0534   BASOSABS 0.0 03/25/2017 0914    Studies/Results: No results found.  Medications: I have reviewed the patient's current medications.  Assessment/Plan:  Decreased functional mobilitysecondary to right temporal ICH with IVH status post craniotomy for hematoma evacuation 06/24/2017 CAD. Continue ASA, statin Hypothyroidism. Continue Synthroid HTN- remains controlled    Length of stay, days: 12  [name],PETER FRANK , MD 07/12/2017, 10:16 AM

## 2017-07-13 ENCOUNTER — Inpatient Hospital Stay (HOSPITAL_COMMUNITY): Payer: Medicare Other | Admitting: Physical Therapy

## 2017-07-13 ENCOUNTER — Inpatient Hospital Stay (HOSPITAL_COMMUNITY): Payer: Medicare Other | Admitting: Occupational Therapy

## 2017-07-13 ENCOUNTER — Inpatient Hospital Stay (HOSPITAL_COMMUNITY): Payer: Medicare Other | Admitting: Speech Pathology

## 2017-07-13 LAB — BASIC METABOLIC PANEL
Anion gap: 10 (ref 5–15)
BUN: 38 mg/dL — AB (ref 6–20)
CHLORIDE: 109 mmol/L (ref 101–111)
CO2: 23 mmol/L (ref 22–32)
CREATININE: 1.06 mg/dL (ref 0.61–1.24)
Calcium: 9.3 mg/dL (ref 8.9–10.3)
GFR calc Af Amer: >60 mL/min (ref >60)
GFR calc non Af Amer: >60 mL/min (ref >60)
GLUCOSE: 96 mg/dL (ref 65–99)
Potassium: 3.7 mmol/L (ref 3.5–5.1)
Sodium: 142 mmol/L (ref 135–145)

## 2017-07-13 MED ORDER — APIXABAN 5 MG PO TABS
5.0000 mg | ORAL_TABLET | Freq: Two times a day (BID) | ORAL | Status: DC
  Administered 2017-07-13 – 2017-07-22 (×18): 5 mg via ORAL
  Filled 2017-07-13 (×18): qty 1

## 2017-07-13 MED ORDER — METHYLPHENIDATE HCL 5 MG PO TABS
10.0000 mg | ORAL_TABLET | Freq: Two times a day (BID) | ORAL | Status: DC
  Administered 2017-07-14 – 2017-07-22 (×18): 10 mg via ORAL
  Filled 2017-07-13 (×18): qty 2

## 2017-07-13 MED ORDER — PHENAZOPYRIDINE HCL 100 MG PO TABS
100.0000 mg | ORAL_TABLET | Freq: Three times a day (TID) | ORAL | Status: DC
  Administered 2017-07-13 – 2017-07-16 (×9): 100 mg via ORAL
  Filled 2017-07-13 (×10): qty 1

## 2017-07-13 MED ORDER — METHOCARBAMOL 500 MG PO TABS
500.0000 mg | ORAL_TABLET | Freq: Four times a day (QID) | ORAL | Status: DC | PRN
  Administered 2017-07-13 – 2017-07-22 (×9): 500 mg via ORAL
  Filled 2017-07-13 (×9): qty 1

## 2017-07-13 NOTE — Plan of Care (Signed)
Problem: RH BLADDER ELIMINATION Goal: RH STG MANAGE BLADDER WITH ASSISTANCE STG Manage Bladder With min Assistance   Outcome: Not Progressing Pt incontinent of urine; needs total assistance with peri/skin care and diaper change.

## 2017-07-13 NOTE — Progress Notes (Signed)
Occupational Therapy Session Note  Patient Details  Name: Randy Durham MRN: [id number] Date of Birth: Mar 14, 1934  Today's Date: 07/13/2017 OT Individual Time: 1100-1157 OT Individual Time Calculation (min): 57 min    Short Term Goals: Week 2:  OT Short Term Goal 1 (Week 2): Pt will initiate once self care task within 5 minutes. OT Short Term Goal 2 (Week 2): Pt will perform UB bathing with max A. OT Short Term Goal 3 (Week 2): Pt will sit on EOB for 10 minutes of static sitting with mod A for balance.  Skilled Therapeutic Interventions/Progress Updates:    Pt received in day room while in wheelchair and transitioned from PT session without difficulty. Pt appearing restless while in chair and keeping eyes closed. Therapist playing christmas music and pt calming, nodding head to music, and tapping hands to beat of songs. OT assisted pt back to room via wheelchair and total A. OT discussed follow up recommendations based on pts current deficits and recommendations to continue therapy at discharge. Caregivers asking several questions as needed. Pt remained in tilt in space wheelchair with belt on and family present in the room upon exiting.   Therapy Documentation Precautions:  Precautions Precautions: Fall Restrictions Weight Bearing Restrictions: No General:   Vital Signs:   Pain: Pain Assessment Pain Assessment: Faces Faces Pain Scale: Hurts little more Pain Type: Acute pain Pain Location: Head Pain Orientation: Anterior Pain Descriptors / Indicators: Headache Pain Frequency: Intermittent Pain Onset: On-going Patients Stated Pain Goal: 1 Pain Intervention(s): Medication (See eMAR) ADL: ADL ADL Comments: refer to functional navigator Vision   Perception    Praxis   Exercises:   Other Treatments:    See Function Navigator for Current Functional Status.   Therapy/Group: Individual Therapy  Alen BleacherBradsher, Serigne Kubicek P 07/13/2017, 12:33 PM

## 2017-07-13 NOTE — Progress Notes (Signed)
Palliative Medicine RN Note: Per PMT NP sign off, pt will need outpatient referral for palliative care at d/c. If pt is going to a facility, this needs to be on the d/c orders/notes. If the pt is going home, RNCM can set it up before d/c.  Thank you. Margret ChanceMelanie G. Deshawna Mcneece, RN, BSN, [facility] 07/13/2017 12:21 PM Cell [phone] 8:00-4:00 Monday-Friday Office [phone]

## 2017-07-13 NOTE — Progress Notes (Signed)
Daily Progress Note   Patient Name: Randy Durham       Date: 07/13/2017 DOB: 04-26-1934  Age: 83 y.o. MRN#: [id number] Attending Physician: Meredith Staggers, MD Primary Care Physician: Seward Carol, MD Admit Date: 06/30/2017  Reason for Consultation/Follow-up: Establishing goals of care and Psychosocial/spiritual support  Subjective: Patient sitting in wheelchair holding head and lower abdomen intermittently. Family states he has muscle and spinal pain at baseline.  24 hour events: New diagnosis of UTI, empirically started on Bactrim DS, awaiting cultures.   Length of Stay: 13  Current Medications: Scheduled Meds:  . acetaminophen  650 mg Oral Q6H  . amLODipine  2.5 mg Oral Daily  . aspirin  81 mg Oral Daily  . atorvastatin  80 mg Oral q1800  . feeding supplement (ENSURE ENLIVE)  237 mL Oral TID BM  . feeding supplement (PRO-STAT SUGAR FREE 64)  30 mL Oral Daily  . heparin subcutaneous  5,000 Units Subcutaneous Q8H  . levothyroxine  112 mcg Per Tube QAC breakfast  . lisinopril  5 mg Oral QHS  . mouth rinse  15 mL Mouth Rinse BID  . methylphenidate  5 mg Oral BID WC  . metoprolol tartrate  12.5 mg Oral BID  . phenazopyridine  100 mg Oral TID WC  . sulfamethoxazole-trimethoprim  1 tablet Oral BID  . topiramate  25 mg Oral Daily  . Valproate Sodium  500 mg Oral Q12H    Continuous Infusions:   PRN Meds: acetaminophen **OR** acetaminophen (TYLENOL) oral liquid 160 mg/5 mL **OR** acetaminophen, methocarbamol, nitroGLYCERIN, ondansetron **OR** ondansetron (ZOFRAN) IV, sorbitol  Physical Exam  Constitutional: He appears well-developed and well-nourished.  Cardiovascular:  Warm and dry.  Pulmonary/Chest: Effort normal. No respiratory distress.  Neurological:  Alert, rubbing  head and abdomen. Speaking unintelligible words. Does not answer questions.    Skin: Skin is warm and dry.  Vitals reviewed.           Vital Signs: BP 137/64 (BP Location: Right Arm)   Pulse 70   Temp 98.2 F (36.8 C) (Oral)   Resp 18   Ht 5' 3"  (1.6 m)   Wt 78.5 kg (173 lb)   SpO2 97%   BMI 30.65 kg/m  SpO2: SpO2: 97 % O2 Device: O2 Device: Not Delivered O2 Flow Rate:    Intake/output  summary:   Intake/Output Summary (Last 24 hours) at 07/13/17 1254 Last data filed at 07/12/17 2230  Gross per 24 hour  Intake              240 ml  Output                0 ml  Net              240 ml   LBM: Last BM Date: 07/12/17 Baseline Weight: Weight: 81.6 kg (179 lb 14.3 oz) Most recent weight: Weight: 78.5 kg (173 lb)       Palliative Assessment/Data: 30%    Flowsheet Rows     Most Recent Value  Intake Tab  Referral Department  Hospitalist  Unit at Time of Referral  Other (Comment)  Palliative Care Primary Diagnosis  Neurology  Date Notified  07/09/17  Palliative Care Type  New Palliative care  Reason for referral  Clarify Goals of Care, Counsel Regarding Hospice  Date of Admission  06/24/17  Date first seen by Palliative Care  07/10/17  # of days Palliative referral response time  1 Day(s)  # of days IP prior to Palliative referral  15  Clinical Assessment  Palliative Performance Scale Score  30%  Pain Max last 24 hours  Not able to report  Pain Min Last 24 hours  Not able to report  Dyspnea Max Last 24 Hours  Not able to report  Dyspnea Min Last 24 hours  Not able to report  Nausea Max Last 24 Hours  Not able to report  Nausea Min Last 24 Hours  Not able to report  Anxiety Max Last 24 Hours  Not able to report  Anxiety Min Last 24 Hours  Not able to report  Other Max Last 24 Hours  Not able to report  Psychosocial & Spiritual Assessment  Palliative Care Outcomes  Patient/Family meeting held?  Yes  Who was at the meeting?  pt's wife and son, [name]  Palliative Care  follow-up planned  Yes, Facility      Patient Active Problem List   Diagnosis Date Noted  . Palliative care by specialist   . Vascular headache   . Lethargy   . AKI (acute kidney injury) ([place])   . Benign essential HTN   . Somnolence   . Intracerebral hemorrhage 06/24/2017  . Hemorrhage of right temporal lobe ([place]) 06/24/2017  . Intraventricular hemorrhage ([place]) 06/24/2017  . Cytotoxic brain edema ([place]) 06/24/2017  . Paroxysmal atrial fibrillation ([place]) 03/30/2015  . Bradycardia 03/27/2015  . NSVT (nonsustained ventricular tachycardia) ([place]) 03/27/2015  . CAD- occluded distal LAD, 75-80% CFX- medical Rx 03/27/2015  . NSTEMI 03/25/15 03/25/2015  . Chronic kidney disease (CKD), stage III (moderate)   . Obesity (BMI 30-39.9)   . Spinal stenosis   . Hypertension   . Hyperlipidemia     Palliative Care Assessment & Plan   Patient Profile: 83 y.o. male  with past medical history of Spinal stenosis, coronary artery disease/non-STEMI/PAF (on Eliquis), hypertension, chronic kidney disease stage III admitted on 06/30/2017 with complaints of a headache, altered mental status, right-sided weakness. Upon admission, patient was found to have a large right temporal intraparenchymal hemorrhage with intraventricular extension and surrounding edema. Patient underwent a craniotomy for evacuation on 06/24/2017. His  Eliquis was discontinued because of intracranial hemorrhage but low-dose aspirin was initiated. . Patient was admitted to the inpatient rehabilitation program and had been progressing until approximately a week ago when he sustained a  left embolic MCA stroke.  Unfortunately, patient has not been able to progress in inpatient rehabilitation program; remains lethargic, dependent for all ADLs, limited oral intake secondary to lethargy/encephalopathy   Assessment: Met with patient's wife, son, [name], and daughter today. Mr. Richardson Dopp has a newly diagnosed UTI, and the family is hopeful that with  treatment his cognitive status will improve. Overall, family understands healing from brain injury takes time, but also understands that he may have limited to no improvement. His son stated "this is not the quality of life he would want".  Discussed code status and family believes he would want to be a DNR but are not ready to formally make that decision and would like a few more days to think about it and see how he does. Concept of feeding tube placement discussed as a possible decision as well, as patient has protein supplements in place with concern for appetite. They will discuss this as well.    Recommendations/Plan:  Pyridium for UTI pain    Robaxin for chronic back pain and muscle pain   He is currently on Topamax for headache nightly which family agrees has helped somewhat, but pain continues to be suboptimally controlled. Recommend increase in dose or frequency, or change to a different agent such as Depakote or Lyrica.   Goals of Care and Additional Recommendations:  Limitations on Scope of Treatment: Full Scope Treatment  Code Status:    Code Status Orders        Start     Ordered   06/30/17 1730  Full code  Continuous     06/30/17 1729    Code Status History    Date Active Date Inactive Code Status Order ID Comments User Context   06/30/2017  5:29 PM  Full Code [id number]  Cathlyn Parsons, PA-C Inpatient   06/24/2017 10:09 AM 06/30/2017  5:15 PM Full Code [id number]  Omar Person, NP Inpatient   06/24/2017  2:12 AM 06/24/2017 10:09 AM Full Code [id number]  Kerney Elbe, MD ED   03/26/2015  4:42 PM 03/28/2015  3:53 PM Full Code [id number]  Lorretta Harp, MD Inpatient   03/26/2015  2:11 AM 03/26/2015  4:42 PM Full Code [id number]  Jacolyn Reedy, MD Inpatient    Advance Directive Documentation     Most Recent Value  Type of Advance Directive  Healthcare Power of Attorney  Pre-existing out of facility DNR order (yellow form or pink MOST form)  -  "MOST" Form in  Place?  -       Prognosis:  Pt likely would qualify for his hospice medicare benefit of less than 6 month if family was interested in pursuing this option. At this point, they are leaning towards more rehab at [facility].  Discharge Planning:  To Be Determined   Thank you for allowing the Palliative Medicine Team to assist in the care of this patient.   Total Time 65 min Prolonged Time Billed  no       Greater than 50%  of this time was spent counseling and coordinating care related to the above assessment and plan.  Asencion Gowda, NP 07/13/2017 1:25 PM Office: [phone] 7am-7pm  Call primary team after hours  Please contact Palliative Medicine Team phone at [phone] for questions and concerns.

## 2017-07-13 NOTE — Progress Notes (Addendum)
[place] PHYSICAL MEDICINE & REHABILITATION     PROGRESS NOTE    Subjective/Complaints: Up in chair with therapy. Family all around  ROS: limited by language  Objective: Vital Signs: Blood pressure 137/64, pulse 70, temperature 98.2 F (36.8 C), temperature source Oral, resp. rate 18, height 5\' 3"  (1.6 m), weight 78.5 kg (173 lb), SpO2 97 %. No results found. No results for input(s): WBC, HGB, HCT, PLT in the last 72 hours.  Recent Labs  07/13/17 0555  NA 142  K 3.7  CL 109  GLUCOSE 96  BUN 38*  CREATININE 1.06  CALCIUM 9.3   CBG (last 3)  No results for input(s): GLUCAP in the last 72 hours.  Wt Readings from Last 3 Encounters:  07/13/17 78.5 kg (173 lb)  06/30/17 81.6 kg (180 lb)  03/25/17 82.1 kg (181 lb)    Physical Exam:  Constitutional: No distress. Vital signs reviewed HENT: Normocephalic.  Eyes: opens eyes intermittently to verbal cues Cardiovascular: RRR without murmur. No JVD  Respiratory:  CTA Bilaterally without wheezes or rales. Normal effort       GI: Bowel sounds are normal. He exhibits no distension. Skin: Skin is warm and dry. Craniotomy site clean and dry Neurological:   Somewhat alert.  Aphasic exp/rec continues Moves both sides, left more than right  Psych:  pleasant  Assessment/Plan: 1. Functional and cognitive deficits secondary to right temporal ICH which require 3+ hours per day of interdisciplinary therapy in a comprehensive inpatient rehab setting. Physiatrist is providing close team supervision and 24 hour management of active medical problems listed below. Physiatrist and rehab team continue to assess barriers to discharge/monitor patient progress toward functional and medical goals.  Function:  Bathing Bathing position   Position: Shower  Bathing parts   Body parts bathed by helper: Right arm, Left arm, Chest, Abdomen, Front perineal area, Buttocks, Right upper leg, Left upper leg, Right lower leg, Left lower leg, Back   Bathing assist Assist Level: 2 helpers      Upper Body Dressing/Undressing Upper body dressing   What is the patient wearing?: Pull over shirt/dress       Pull over shirt/dress - Perfomed by helper: Thread/unthread right sleeve, Thread/unthread left sleeve, Put head through opening, Pull shirt over trunk        Upper body assist Assist Level: 2 helpers      Lower Body Dressing/Undressing Lower body dressing   What is the patient wearing?: Pants, Non-skid slipper socks       Pants- Performed by helper: Thread/unthread right pants leg, Thread/unthread left pants leg, Pull pants up/down   Non-skid slipper socks- Performed by helper: Don/doff right sock, Don/doff left sock       Shoes - Performed by helper: Don/doff right shoe, Don/doff left shoe       TED Hose - Performed by helper: Don/doff right TED hose, Don/doff left TED hose  Lower body assist Assist for lower body dressing: 2 Helpers      Toileting Toileting Toileting activity did not occur: No continent bowel/bladder event   Toileting steps completed by helper: Adjust clothing prior to toileting, Performs perineal hygiene, Adjust clothing after toileting    Toileting assist Assist level: Two helpers   Transfers Chair/bed transfer   Chair/bed transfer method: Other Chair/bed transfer assist level: 2 helpers Chair/bed transfer assistive device: Systems developerMechanical lift Mechanical lift: Air traffic controllerMaxisky   Locomotion Ambulation Ambulation activity did not occur: Safety/medical concerns (fatigue)   Max distance: 20 ft Assist level: 2  helpers   Education administrator activity did not occur: Safety/medical concerns     Assist Level: Dependent (Pt equals 0%)  Cognition Comprehension Comprehension assist level: Understands basic less than 25% of the time/ requires cueing >75% of the time  Expression Expression assist level: Expresses basis less than 25% of the time/requires cueing >75% of the time.  Social Interaction Social  Interaction assist level: Interacts appropriately less than 25% of the time. May be withdrawn or combative.  Problem Solving Problem solving assist level: Solves basic less than 25% of the time - needs direction nearly all the time or does not effectively solve problems and may need a restraint for safety  Memory Memory assist level: Recognizes or recalls less than 25% of the time/requires cueing greater than 75% of the time   Medical Problem List and Plan: 1. Decreased functional mobilitysecondary to right temporal ICH with IVH status post craniotomy for hematoma evacuation 06/24/2017 -appreciate Palliative care assistance.   -I'm not sure that he is not a hospice candidate unless he receives some source of supplemental nutrition (and whether it's appropriate to place a PEG is another question) 2. DVT Prophylaxis/stroke prophylaxis: Neurology/decided upon resumption of eliquis. This was not started Friday. WIll begin today. Stop ASA and heparin 3. Pain Management/back pain: Discontinue hydrocodone due to somnolence.   Monitor mental status  Topamax started 9/2--- HS schedule 4. Mood:dc amantadine---change to ritalin beginning at noon today 5. Neuropsych: This patient is notcapable of making decisions on hisown behalf. -continue sleep chart -limiting neuro-sedating medications. off Seroquel  -stimulate during day with ritalin 6. Skin/Wound Care: Routine skin checks 7. Fluids/Electrolytes/Nutrition: encourage PO as possible      -cal ct  -AKI, Bun/Cr continue to  trend up  -if increasing further tomorrow, will need to initiate IVF 8.Seizure prophylaxis. Valproate 500 mg every 12 hours. EEG negative  -VPA level 55 on 8/29 9.CAD with cardiac catheterization 03/26/2015. Continue low-dose aspirin. 10.Hypertension/PAF. Lisinopril 5 mg daily, Lopressor 12.5 mg twice a day.   Norvasc 2.5 started on 9/1 to limit renal effects  BP controlled at  present 11.Hyperlipidemia. Lipitor 12.Hypothyroidism. Synthroid. Follow up levels as outpt.  13. UTI: repeat Ucx over weekend positive for proteus  -empiric bactrim started   LOS (Days) 13 A FACE TO FACE EVALUATION WAS PERFORMED  [name], MD 07/13/2017 3:25 PM

## 2017-07-13 NOTE — Progress Notes (Addendum)
Speech Language Pathology Daily Session Note  Patient Details  Name: Randy Durham MRN: [id number] Date of Birth: October 17, 1934  Today's Date: 07/13/2017 SLP Individual Time: 1300-1340 SLP Individual Time Calculation (min): 40 min  Short Term Goals: Week 2: SLP Short Term Goal 1 (Week 2): Patient will consume current diet with minimal overt s/s of aspiration with Min A verbal cues for use of swallowing compensatory strategies.  SLP Short Term Goal 2 (Week 2): Pt will demonstrate sustained attention to functional task for 2 minutes with Max multimodal cues. SLP Short Term Goal 3 (Week 2): Pt will initate basic familar ADL tasks in 30% of opportunties with Max multimodal cues. SLP Short Term Goal 4 (Week 2): Pt will answer yes/no questions with 30% accuracy with Max A multimodal cues. SLP Short Term Goal 5 (Week 2): Pt will communicate basic wants and needs via multimodal communciation with Max A verbal cues.  Skilled Therapeutic Interventions: Skilled treatment session focused on cognitive goals. SLP facilitated session by providing Min A verbal cues for patient to initiate drinking from a straw to consume ~90% of a drink. Patient was essentially nonverbal throughout session despite Max A multimodal cues and playing familiar music. However, patient did move his head to the music 20% of the time. Patient left upright in wheelchair with all needs within reach. Continue with current plan of care.      Function:   Cognition Comprehension Comprehension assist level: Understands basic less than 25% of the time/ requires cueing >75% of the time  Expression   Expression assist level: Expresses basis less than 25% of the time/requires cueing >75% of the time.  Social Interaction Social Interaction assist level: Interacts appropriately less than 25% of the time. May be withdrawn or combative.  Problem Solving Problem solving assist level: Solves basic less than 25% of the time - needs direction nearly  all the time or does not effectively solve problems and may need a restraint for safety  Memory Memory assist level: Recognizes or recalls less than 25% of the time/requires cueing greater than 75% of the time    Pain Pain Assessment Pain Assessment: Faces Faces Pain Scale: Hurts little more Pain Type: Acute pain Pain Location: Head Pain Orientation: Anterior Pain Descriptors / Indicators: Headache Pain Frequency: Intermittent Pain Onset: On-going Patients Stated Pain Goal: 1 Pain Intervention(s): Medication (See eMAR)  Therapy/Group: Individual Therapy  Randy Durham 07/13/2017, 3:38 PM

## 2017-07-13 NOTE — Progress Notes (Signed)
Physical Therapy Session Note  Patient Details  Name: Randy Durham MRN: [id number] Date of Birth: [birth year]  Today's Date: 07/13/2017 PT Individual Time: 1001-1100 PT Individual Time Calculation (min): 59 min   Short Term Goals: Week 2:  PT Short Term Goal 1 (Week 2): Pt will perform rolling in bed L<>R with hospital bed features and max assist +1.  PT Short Term Goal 2 (Week 2): Pt will transfer supine<>sitting EOB with max assist +1.  PT Short Term Goal 3 (Week 2): Pt will transfer bed<>chair with max assist +1.  Skilled Therapeutic Interventions/Progress Updates:  Pt received in bed with son [name] present. Pt without behaviors demonstrating pain during session. Therapist spent significant time discussing d/c options and educating son on expected progress in regards to functional mobility. Pt's son very receptive of information and wishes to talk with his mother & sister before deciding on taking pt home with 24 hr care or SNF. Pt observed to be incontinent of urine and required total assist for peri care and donning shorts & t-shirt. Pt requires +2 assist for rolling in bed for clothing management and sling placement. Pt transferred bed>w/c via [employer] total assist. Transported pt to dayroom and attempted to engage pt in task focusing on awareness & following one step commands. Pt unable to locate bright bean bags held in front of him nor placed on his lap. Pt with intermittent ability to turn his head when his name was called and only able to recognize therapist touching his leg/arm 50% of the time. At end of session pt left in handoff to OT.  Therapy Documentation Precautions:  Precautions Precautions: Fall Restrictions Weight Bearing Restrictions: No   See Function Navigator for Current Functional Status.   Therapy/Group: Individual Therapy  Sandi MariscalVictoria M Annalaya Wile 07/13/2017, 12:43 PM

## 2017-07-14 ENCOUNTER — Inpatient Hospital Stay (HOSPITAL_COMMUNITY): Payer: Medicare Other | Admitting: Physical Therapy

## 2017-07-14 ENCOUNTER — Inpatient Hospital Stay (HOSPITAL_COMMUNITY): Payer: Medicare Other | Admitting: Occupational Therapy

## 2017-07-14 ENCOUNTER — Inpatient Hospital Stay (HOSPITAL_COMMUNITY): Payer: Medicare Other | Admitting: Speech Pathology

## 2017-07-14 LAB — BASIC METABOLIC PANEL
Anion gap: 8 (ref 5–15)
BUN: 37 mg/dL — ABNORMAL HIGH (ref 6–20)
CHLORIDE: 108 mmol/L (ref 101–111)
CO2: 24 mmol/L (ref 22–32)
CREATININE: 1.28 mg/dL — AB (ref 0.61–1.24)
Calcium: 9.3 mg/dL (ref 8.9–10.3)
GFR calc non Af Amer: 51 mL/min — ABNORMAL LOW (ref >60)
GFR, EST AFRICAN AMERICAN: 59 mL/min — AB (ref >60)
Glucose, Bld: 116 mg/dL — ABNORMAL HIGH (ref 65–99)
POTASSIUM: 4.2 mmol/L (ref 3.5–5.1)
SODIUM: 140 mmol/L (ref 135–145)

## 2017-07-14 LAB — URINE CULTURE: Culture: >=100000 — AB

## 2017-07-14 MED ORDER — AMOXICILLIN 250 MG PO CAPS
250.0000 mg | ORAL_CAPSULE | Freq: Three times a day (TID) | ORAL | Status: DC
  Administered 2017-07-14 – 2017-07-15 (×3): 250 mg via ORAL
  Filled 2017-07-14 (×3): qty 1

## 2017-07-14 MED ORDER — PRO-STAT SUGAR FREE PO LIQD
30.0000 mL | Freq: Two times a day (BID) | ORAL | Status: DC
  Administered 2017-07-14 – 2017-07-22 (×16): 30 mL via ORAL
  Filled 2017-07-14 (×16): qty 30

## 2017-07-14 NOTE — Progress Notes (Signed)
Daily Progress Note   Patient Name: Randy Durham       Date: 07/14/2017 DOB: 1934/08/01  Age: 83 y.o. MRN#: [id number] Attending Physician: Randy Staggers, MD Primary Care Physician: Randy Carol, MD Admit Date: 06/30/2017  Reason for Consultation/Follow-up: Establishing goals of care and Psychosocial/spiritual support  Subjective: Patient resting in bed. He is alert and looking around. His son states today is a bad day as his father has been uncomfortable. His son states he has been showing signs of having to void by holding penis, but takes up to hours to urinate.  24 hour events: UTI, abx adjusted to culture sensitivities.  Decreased PO intake.   Length of Stay: 14  Current Medications: Scheduled Meds:  . acetaminophen  650 mg Oral Q6H  . amLODipine  2.5 mg Oral Daily  . amoxicillin  250 mg Oral Q8H  . apixaban  5 mg Oral BID  . atorvastatin  80 mg Oral q1800  . feeding supplement (ENSURE ENLIVE)  237 mL Oral TID BM  . feeding supplement (PRO-STAT SUGAR FREE 64)  30 mL Oral Daily  . levothyroxine  112 mcg Per Tube QAC breakfast  . lisinopril  5 mg Oral QHS  . mouth rinse  15 mL Mouth Rinse BID  . methylphenidate  10 mg Oral BID WC  . metoprolol tartrate  12.5 mg Oral BID  . phenazopyridine  100 mg Oral TID WC  . topiramate  25 mg Oral Daily  . Valproate Sodium  500 mg Oral Q12H    Continuous Infusions:   PRN Meds: acetaminophen **OR** acetaminophen (TYLENOL) oral liquid 160 mg/5 mL **OR** acetaminophen, methocarbamol, nitroGLYCERIN, ondansetron **OR** ondansetron (ZOFRAN) IV, sorbitol  Physical Exam  Constitutional: He appears well-developed and well-nourished. He appears distressed.  Holding penis. Grimacing.   Cardiovascular:  Warm and dry.  Pulmonary/Chest:  Effort normal. No respiratory distress.  Neurological:  Alert, rubbing head and abdomen. Speaking unintelligible words. Does not answer questions.    Skin: Skin is warm and dry.  Vitals reviewed.           Vital Signs: BP (!) 106/55 (BP Location: Left Arm)   Pulse (!) 110   Temp 98.2 F (36.8 C) (Oral)   Resp 20   Ht 5' 3"  (1.6 m)   Wt 78.7 kg (173 lb 8.8  oz)   SpO2 95%   BMI 30.74 kg/m  SpO2: SpO2: 95 % O2 Device: O2 Device: Not Delivered O2 Flow Rate:    Intake/output summary:  No intake or output data in the 24 hours ending 07/14/17 1453 LBM: Last BM Date: 07/14/17 Baseline Weight: Weight: 81.6 kg (179 lb 14.3 oz) Most recent weight: Weight: 78.7 kg (173 lb 8.8 oz)       Palliative Assessment/Data: 30% at best.     Flowsheet Rows     Most Recent Value  Intake Tab  Referral Department  Hospitalist  Unit at Time of Referral  Other (Comment)  Palliative Care Primary Diagnosis  Neurology  Date Notified  07/09/17  Palliative Care Type  New Palliative care  Reason for referral  Clarify Goals of Care, Counsel Regarding Hospice  Date of Admission  06/24/17  Date first seen by Palliative Care  07/10/17  # of days Palliative referral response time  1 Day(s)  # of days IP prior to Palliative referral  15  Clinical Assessment  Palliative Performance Scale Score  30%  Pain Max last 24 hours  Not able to report  Pain Min Last 24 hours  Not able to report  Dyspnea Max Last 24 Hours  Not able to report  Dyspnea Min Last 24 hours  Not able to report  Nausea Max Last 24 Hours  Not able to report  Nausea Min Last 24 Hours  Not able to report  Anxiety Max Last 24 Hours  Not able to report  Anxiety Min Last 24 Hours  Not able to report  Other Max Last 24 Hours  Not able to report  Psychosocial & Spiritual Assessment  Palliative Care Outcomes  Patient/Family meeting held?  Yes  Who was at the meeting?  pt's wife and son, Randy Durham  Palliative Care follow-up planned  Yes, Facility        Patient Active Problem List   Diagnosis Date Noted  . Palliative care by specialist   . Vascular headache   . Lethargy   . AKI (acute kidney injury) ([place])   . Benign essential HTN   . Somnolence   . Intracerebral hemorrhage 06/24/2017  . Hemorrhage of right temporal lobe ([place]) 06/24/2017  . Intraventricular hemorrhage ([place]) 06/24/2017  . Cytotoxic brain edema ([place]) 06/24/2017  . Paroxysmal atrial fibrillation ([place]) 03/30/2015  . Bradycardia 03/27/2015  . NSVT (nonsustained ventricular tachycardia) ([place]) 03/27/2015  . CAD- occluded distal LAD, 75-80% CFX- medical Rx 03/27/2015  . NSTEMI 03/25/15 03/25/2015  . Chronic kidney disease (CKD), stage III (moderate)   . Obesity (BMI 30-39.9)   . Spinal stenosis   . Hypertension   . Hyperlipidemia     Palliative Care Assessment & Plan   Patient Profile: 83 y.o. male  with past medical history of Spinal stenosis, coronary artery disease/non-STEMI/PAF (on Eliquis), hypertension, chronic kidney disease stage III admitted on 06/30/2017 with complaints of a headache, altered mental status, right-sided weakness. Upon admission, patient was found to have a large right temporal intraparenchymal hemorrhage with intraventricular extension and surrounding edema. Patient underwent a craniotomy for evacuation on 06/24/2017. His  Eliquis was discontinued because of intracranial hemorrhage but low-dose aspirin was initiated. . Patient was admitted to the inpatient rehabilitation program and had been progressing until approximately a week ago when he sustained a left embolic MCA stroke.  Unfortunately, patient has not been able to progress in inpatient rehabilitation program; remains lethargic, dependent for all ADLs, limited oral intake secondary to lethargy/encephalopathy  Assessment: Met with patient's wife and son today. Son voices that he believes this is a bad day, and that he is still hopeful for recovery though he realizes he will not be  what he was before. Discussed feeding tube placement and concern that whether it be a PEG or DHT, he will pull it out without restraints. They are aware that this is likely an imminent decision within days.   Recommendations/Plan:  Recommend resuming home Flomax  Pyridium for UTI pain    Robaxin for chronic back pain and muscle pain   He is currently on Topamax for headache nightly which family agrees has helped somewhat, but pain continues to be suboptimally controlled. Recommend increase in dose or frequency, or change to a different agent such as Depakote or Lyrica.   Goals of Care and Additional Recommendations:  Limitations on Scope of Treatment: Full Scope Treatment  Code Status:    Code Status Orders        Start     Ordered   06/30/17 1730  Full code  Continuous     06/30/17 1729    Code Status History    Date Active Date Inactive Code Status Order ID Comments User Context   06/30/2017  5:29 PM  Full Code [id number]  Cathlyn Parsons, PA-C Inpatient   06/24/2017 10:09 AM 06/30/2017  5:15 PM Full Code [id number]  Omar Person, NP Inpatient   06/24/2017  2:12 AM 06/24/2017 10:09 AM Full Code [id number]  Kerney Elbe, MD ED   03/26/2015  4:42 PM 03/28/2015  3:53 PM Full Code [id number]  Lorretta Harp, MD Inpatient   03/26/2015  2:11 AM 03/26/2015  4:42 PM Full Code [id number]  Jacolyn Reedy, MD Inpatient    Advance Directive Documentation     Most Recent Value  Type of Advance Directive  Healthcare Power of Attorney  Pre-existing out of facility DNR order (yellow form or pink MOST form)  -  "MOST" Form in Place?  -       Prognosis:  Pt likely would qualify for his hospice medicare benefit of less than 6 month if family was interested in pursuing this option. At this point, they are leaning towards more rehab at [facility].  Discharge Planning:  To Be Determined   Thank you for allowing the Palliative Medicine Team to assist in the care of this  patient.   Total Time 65 min Prolonged Time Billed  no       Greater than 50%  of this time was spent counseling and coordinating care related to the above assessment and plan.  Asencion Gowda, NP 07/14/2017 2:53 PM Office: [phone] 7am-7pm  Call primary team after hours  Please contact Palliative Medicine Team phone at [phone] for questions and concerns.

## 2017-07-14 NOTE — Progress Notes (Signed)
Physical Therapy Weekly Progress Note  Patient Details  Name: Randy Durham MRN: [id number] Date of Birth: November 20, 1933  Beginning of progress report period: July 08, 2017 End of progress report period: July 14, 2017  Today's Date: 07/14/2017   Patient has met 0 of 3 short term goals.  Pt is making very slow progress towards all PT goals as pt is limited by impaired cognition and lethargy. Pt continues to require +2 assist for all functional mobility 2/2 poor initiation, awareness, overall cognition, and vision. Pt's family has been educated on anticipated progress and are working on finalizing a d/c plan. Educated pt's son on need to participate in hands on caregiver education if they plan to take pt home with 24 hr assistance.   Patient continues to demonstrate the following deficits muscle weakness, decreased cardiorespiratoy endurance, decreased coordination, decreased visual acuity and decreased visual perceptual skills, decreased attention to left and decreased attention to right, decreased initiation, decreased attention, decreased awareness, decreased problem solving, decreased safety awareness, decreased memory and delayed processing, and decreased sitting balance, decreased standing balance, decreased postural control and decreased balance strategies and therefore will continue to benefit from skilled PT intervention to increase functional independence with mobility.  Patient progressing toward long term goals..  Continue plan of care.  PT Short Term Goals Week 2:  PT Short Term Goal 1 (Week 2): Pt will perform rolling in bed L<>R with hospital bed features and max assist +1.  PT Short Term Goal 1 - Progress (Week 2): Not met PT Short Term Goal 2 (Week 2): Pt will transfer supine<>sitting EOB with max assist +1.  PT Short Term Goal 2 - Progress (Week 2): Not met PT Short Term Goal 3 (Week 2): Pt will transfer bed<>chair with max assist +1. PT Short Term Goal 3 - Progress (Week  2): Not met Week 3:  PT Short Term Goal 1 (Week 3): Pt will perform rolling in bed L<>R with hospital bed features and max assist +1.  PT Short Term Goal 2 (Week 3): Pt will transfer supine<>sitting EOB with max assist +1.  PT Short Term Goal 3 (Week 3): Pt will transfer bed<>chair with max assist +1.   Therapy Documentation Precautions:  Precautions Precautions: Fall Restrictions Weight Bearing Restrictions: No   See Function Navigator for Current Functional Status.  Therapy/Group: Individual Therapy  Waunita Schooner 07/14/2017, 8:22 AM

## 2017-07-14 NOTE — Progress Notes (Signed)
Physical Therapy Note  Patient Details  Name: Randy Durham MRN: [id number] Date of Birth: Sep 11, 1934 Today's Date: 07/14/2017    Pt's plan of care adjusted to QD after speaking with care team and discussed with MD in team conference as pt currently unable to tolerate current therapy schedule with OT, PT, and SLP.     [name] 07/14/2017, 2:45 PM

## 2017-07-14 NOTE — Progress Notes (Addendum)
[facility] PHYSICAL MEDICINE & REHABILITATION     PROGRESS NOTE    Subjective/Complaints: Just waking up. Eyes closed.   ROS: Limited due to cognitive/behavioral   Objective: Vital Signs: Blood pressure 127/79, pulse (!) 105, temperature 98.4 F (36.9 C), temperature source Oral, resp. rate 18, height 5\' 3"  (1.6 m), weight 78.7 kg (173 lb 8.8 oz), SpO2 98 %. No results found. No results for input(s): WBC, HGB, HCT, PLT in the last 72 hours.  Recent Labs  07/13/17 0555 07/14/17 0724  NA 142 140  K 3.7 4.2  CL 109 108  GLUCOSE 96 116*  BUN 38* 37*  CREATININE 1.06 1.28*  CALCIUM 9.3 9.3   CBG (last 3)  No results for input(s): GLUCAP in the last 72 hours.  Wt Readings from Last 3 Encounters:  07/14/17 78.7 kg (173 lb 8.8 oz)  06/30/17 81.6 kg (180 lb)  03/25/17 82.1 kg (181 lb)    Physical Exam:  Constitutional: No distress. Vital signs reviewed HENT: Normocephalic.  Eyes: opens eyes intermittently to verbal cues Cardiovascular: RRR without murmur. No JVD   Respiratory:  CTA Bilaterally without wheezes or rales. Normal effort        GI: Bowel sounds are normal. He exhibits no distension. Skin: Skin is warm and dry. Craniotomy site clean and dry Neurological:   Keeps eyes closed. Does not follow commands.     Psych:  pleasant  Assessment/Plan: 1. Functional and cognitive deficits secondary to right temporal ICH which require 3+ hours per day of interdisciplinary therapy in a comprehensive inpatient rehab setting. Physiatrist is providing close team supervision and 24 hour management of active medical problems listed below. Physiatrist and rehab team continue to assess barriers to discharge/monitor patient progress toward functional and medical goals.  Function:  Bathing Bathing position   Position: Shower  Bathing parts   Body parts bathed by helper: Right arm, Left arm, Chest, Abdomen, Front perineal area, Buttocks, Right upper leg, Left upper leg, Right  lower leg, Left lower leg, Back  Bathing assist Assist Level: 2 helpers      Upper Body Dressing/Undressing Upper body dressing   What is the patient wearing?: Pull over shirt/dress       Pull over shirt/dress - Perfomed by helper: Thread/unthread right sleeve, Thread/unthread left sleeve, Put head through opening, Pull shirt over trunk        Upper body assist Assist Level: 2 helpers      Lower Body Dressing/Undressing Lower body dressing   What is the patient wearing?: Pants, Non-skid slipper socks       Pants- Performed by helper: Thread/unthread right pants leg, Thread/unthread left pants leg, Pull pants up/down   Non-skid slipper socks- Performed by helper: Don/doff right sock, Don/doff left sock       Shoes - Performed by helper: Don/doff right shoe, Don/doff left shoe       TED Hose - Performed by helper: Don/doff right TED hose, Don/doff left TED hose  Lower body assist Assist for lower body dressing: 2 Helpers      Toileting Toileting Toileting activity did not occur: No continent bowel/bladder event   Toileting steps completed by helper: Adjust clothing prior to toileting, Performs perineal hygiene, Adjust clothing after toileting    Toileting assist Assist level: Two helpers   Transfers Chair/bed transfer   Chair/bed transfer method: Other Chair/bed transfer assist level: 2 helpers Chair/bed transfer assistive device: Systems developerMechanical lift Mechanical lift: Air traffic controllerMaxisky   Locomotion Ambulation Ambulation activity did not  occur: Safety/medical concerns (fatigue)   Max distance: 20 ft Assist level: 2 helpers   Education administrator activity did not occur: Safety/medical concerns     Assist Level: Dependent (Pt equals 0%)  Cognition Comprehension Comprehension assist level: Understands basic less than 25% of the time/ requires cueing >75% of the time  Expression Expression assist level: Expresses basis less than 25% of the time/requires cueing >75% of the  time.  Social Interaction Social Interaction assist level: Interacts appropriately less than 25% of the time. May be withdrawn or combative.  Problem Solving Problem solving assist level: Solves basic less than 25% of the time - needs direction nearly all the time or does not effectively solve problems and may need a restraint for safety  Memory Memory assist level: Recognizes or recalls less than 25% of the time/requires cueing greater than 75% of the time   Medical Problem List and Plan: 1. Decreased functional mobilitysecondary to right temporal ICH with IVH status post craniotomy for hematoma evacuation 06/24/2017 -appreciate Palliative care assistance.   -working toward SNF placement likely. D/w team today in conference.  2. DVT Prophylaxis/stroke prophylaxis: Neurology/decided upon resumption of eliquis. This was not started Friday. WIll begin today. Stop ASA and heparin 3. Pain Management/back pain: Discontinue hydrocodone due to somnolence.   Monitor mental status  Topamax started 9/2--- HS schedule 4. Mood:dc amantadine---change to ritalin beginning at noon today 5. Neuropsych: This patient is notcapable of making decisions on hisown behalf. -continue sleep chart -limiting neuro-sedating medications. off Seroquel  -stimulate during day with ritalin 6. Skin/Wound Care: Routine skin checks 7. Fluids/Electrolytes/Nutrition: encourage PO as possible      -cal ct  -AKI, Bun/Cr stable from yesterday  -spoke to son at length regarding the need to really push fluids. Perhaps he can avoid iv or peg.  8.Seizure prophylaxis. Valproate 500 mg every 12 hours. EEG negative  -VPA level 55 on 8/29 9.CAD with cardiac catheterization 03/26/2015. Continue low-dose aspirin. 10.Hypertension/PAF. Lisinopril 5 mg daily, Lopressor 12.5 mg twice a day.   Norvasc 2.5 started on 9/1 to limit renal effects  BP controlled at present 11.Hyperlipidemia.  Lipitor 12.Hypothyroidism. Synthroid. Follow up levels as outpt.  13. UTI: repeat Ucx over weekend positive for proteus  -empiric bactrim started--change to amoxil for 7 day course as of today (9/11)   Greater than 50% of time during this encounter was spent counseling patient/family in regard to feeding/nutritional options, prognosis, etc.    LOS (Days) 14 A FACE TO FACE EVALUATION WAS PERFORMED  [name], MD 07/14/2017 9:59 AM

## 2017-07-14 NOTE — Progress Notes (Signed)
Nutrition Follow-up  DOCUMENTATION CODES:   Obesity unspecified  INTERVENTION:  Continue Ensure Enlive po TID, each supplement provides 350 kcal and 20 grams of protein.  Provide 30 ml Prostat po BID, each supplement provides 100 kcal and 15 grams of protein.   Encourage adequate PO intake.   NUTRITION DIAGNOSIS:   Inadequate oral intake related to lethargy/confusion as evidenced by meal completion < 50%, per patient/family report; ongoing  GOAL:   Patient will meet greater than or equal to 90% of their needs; progressing  MONITOR:   PO intake, Diet advancement, I & O's  REASON FOR ASSESSMENT:   Consult Calorie Count  ASSESSMENT:   Pt with PMH of CAD, NSTEMI, PAF, HTN, CKD stage III who was recently hospitalized for large R ICH and s/p crani admitted to rehab.  Diet has been downgraded to full liquid diet per SLP to maximize intake. Meal completion at lunch was 50%. Pt with poor arousal. Family at bedside reports pt has been consuming his supplements. RD to continue with Ensure however increase Prostat to BID to aid in adequate protein. Family encouraging pt to eat at meals.   Labs and medications reviewed.   Diet Order:  Diet full liquid Room service appropriate? Yes; Fluid consistency: Thin  Skin:   (Incision on R head)  Last BM:  9/11  Height:   Ht Readings from Last 1 Encounters:  06/30/17 5\' 3"  (1.6 m)    Weight:   Wt Readings from Last 1 Encounters:  07/14/17 173 lb 8.8 oz (78.7 kg)    Ideal Body Weight:  56.3 kg  BMI:  Body mass index is 30.74 kg/m.  Estimated Nutritional Needs:   Kcal:  1700-1900  Protein:  95-110 grams  Fluid:  > 1.7 L/day  EDUCATION NEEDS:   No education needs identified at this time  Roslyn SmilingStephanie Giani Winther, MS, RD, LDN Pager # [phone] After hours/ weekend pager # [phone]

## 2017-07-14 NOTE — Progress Notes (Signed)
Speech Language Pathology Daily Session Note  Patient Details  Name: Randy Durham MRN: [id number] Date of Birth: 24-Sep-1934  Today's Date: 07/14/2017 SLP Individual Time: 0730-0810 SLP Individual Time Calculation (min): 40 min  Short Term Goals: Week 2: SLP Short Term Goal 1 (Week 2): Patient will consume current diet with minimal overt s/s of aspiration with Min A verbal cues for use of swallowing compensatory strategies.  SLP Short Term Goal 2 (Week 2): Pt will demonstrate sustained attention to functional task for 2 minutes with Max multimodal cues. SLP Short Term Goal 3 (Week 2): Pt will initate basic familar ADL tasks in 30% of opportunties with Max multimodal cues. SLP Short Term Goal 4 (Week 2): Pt will answer yes/no questions with 30% accuracy with Max A multimodal cues. SLP Short Term Goal 5 (Week 2): Pt will communicate basic wants and needs via multimodal communciation with Max A verbal cues.  Skilled Therapeutic Interventions: Skilled treatment session focused on cognitive goals. SLP facilitated session by providing Max A verbal and tactile cues for arousal, to keep his eyes open, and for initiation with self-feeding of orange juice. Patient consumed liquids without overt s/s of aspiration. Patient with increased verbalizations this session that was nonpurposeful throughout session. Downgraded patient's diet to full liquid to maximize intake. Patient left upright in bed with family present. Continue with current plan of care.      Function:  Eating Eating   Modified Consistency Diet: Yes Eating Assist Level: Help managing cup/glass;Helper feeds patient     MudloggerHelper Scoops Food on Utensil: Every scoop     Cognition Comprehension Comprehension assist level: Understands basic less than 25% of the time/ requires cueing >75% of the time  Expression   Expression assist level: Expresses basis less than 25% of the time/requires cueing >75% of the time.  Social Interaction Social  Interaction assist level: Interacts appropriately less than 25% of the time. May be withdrawn or combative.  Problem Solving Problem solving assist level: Solves basic less than 25% of the time - needs direction nearly all the time or does not effectively solve problems and may need a restraint for safety  Memory Memory assist level: Recognizes or recalls less than 25% of the time/requires cueing greater than 75% of the time    Pain Pain Assessment Faces Pain Scale: Hurts a little bit  Therapy/Group: Individual Therapy  Rosemae Mcquown 07/14/2017, 2:34 PM

## 2017-07-14 NOTE — Progress Notes (Signed)
Occupational Therapy Session Note  Patient Details  Name: Randy Durham MRN: [id number] Date of Birth: [birth year]  Today's Date: 07/14/2017 OT Individual Time: 1100-1155 OT Individual Time Calculation (min): 55 min    Short Term Goals: Week 2:  OT Short Term Goal 1 (Week 2): Pt will initiate once self care task within 5 minutes. OT Short Term Goal 2 (Week 2): Pt will perform UB bathing with max A. OT Short Term Goal 3 (Week 2): Pt will sit on EOB for 10 minutes of static sitting with mod A for balance.  Skilled Therapeutic Interventions/Progress Updates:    Pt received in wheelchair and transitioning from PT session without difficulty. Skilled OT intervention with focus on initiation and alertness. Pt given cup to drink from and once placed in hand he brought to mouth several times with assistance to place straw into mouth. Lemon juice utilized on oral swab in an attempt to alert pt but he did not open eyes throughout session. Pt appeared to be more and more uncomfortable in wheelchair as session progress. Pt attempting sit <>stand in STEDY with +2 assistance but pt resistively pushing back towards wheelchair. Maxi sky utilize for transfer from wheelchair >bed for safety. Total A to roll  L <> R and removed LB clothing as pt had been incontinent of BM and urinating while attempting to change brief. Pt required second helper for safety with task. Pt resting with family present in room. Call bell and all needed items within reach upon exiting.    Therapy Documentation Precautions:  Precautions Precautions: Fall Restrictions Weight Bearing Restrictions: No General:   Vital Signs: Therapy Vitals Temp: 98.2 F (36.8 C) Temp Source: Oral Pulse Rate: (!) 110 Resp: 20 BP: (!) 106/55 Patient Position (if appropriate): Sitting Oxygen Therapy SpO2: 95 % O2 Device: Not Delivered Pain:   ADL: ADL ADL Comments: refer to functional navigator Vision   Perception    Praxis    Exercises:   Other Treatments:    See Function Navigator for Current Functional Status.   Therapy/Group: Individual Therapy  Alen BleacherBradsher, Kalyn Hofstra P 07/14/2017, 5:07 PM

## 2017-07-14 NOTE — Progress Notes (Signed)
Physical Therapy Session Note  Patient Details  Name: Randy Durham MRN: [id number] Date of Birth: [birth year]  Today's Date: 07/14/2017 PT Individual Time: 1021-1100 PT Individual Time Calculation (min): 39 min   Short Term Goals: Week 2:  PT Short Term Goal 1 (Week 2): Pt will perform rolling in bed L<>R with hospital bed features and max assist +1.  PT Short Term Goal 2 (Week 2): Pt will transfer supine<>sitting EOB with max assist +1.  PT Short Term Goal 3 (Week 2): Pt will transfer bed<>chair with max assist +1.  Skilled Therapeutic Interventions/Progress Updates:  Pt received in bed with son [name] and wife [name](Lucille) present reporting they would like for pt to d/c to SNF following CIR, LCSW made aware. Pt with behaviors demonstrating discomfort in perineal area as he would attempt to grab himself; pt recently diagnosed with UTI. Son attempted to assist pt with use of urinal but pt unaware and unable to void. Pt required +2 assist for rolling L<>R and and dependent assistance for donning shorts & shirt. Pt transferred bed>TIS w/c via maxisky total assist for safety 2/2 pt's lethargy. Once in w/c pt set up at sink and therapist assisted him with washing face (pt unable to initiate despite HOH assist from therapist), washing hands (pt able to rub soap in but required total assist for initiation and termination of task and to rinse hands), and pt unable to initiate donning glasses ultimately requiring total assist. [facility] and attempted to engage pt in dynavision with lights off and door closed for quite controlled environment, and HOH assist but pt unable to attend to task or keep eyes open. Pt with minimal to no response when therapist said his name during session. At end of session pt left sitting in TIS w/c in handoff to OT.  Therapy Documentation Precautions:  Precautions Precautions: Fall Restrictions Weight Bearing Restrictions: No   See Function Navigator for  Current Functional Status.   Therapy/Group: Individual Therapy  Sandi MariscalVictoria M Darby Fleeman 07/14/2017, 11:48 AM

## 2017-07-15 ENCOUNTER — Inpatient Hospital Stay (HOSPITAL_COMMUNITY): Payer: Medicare Other

## 2017-07-15 ENCOUNTER — Inpatient Hospital Stay (HOSPITAL_COMMUNITY): Payer: Medicare Other | Admitting: Occupational Therapy

## 2017-07-15 ENCOUNTER — Inpatient Hospital Stay (HOSPITAL_COMMUNITY): Payer: Medicare Other | Admitting: Speech Pathology

## 2017-07-15 DIAGNOSIS — N390 Urinary tract infection, site not specified: Secondary | ICD-10-CM

## 2017-07-15 DIAGNOSIS — A499 Bacterial infection, unspecified: Secondary | ICD-10-CM

## 2017-07-15 MED ORDER — SODIUM CHLORIDE 0.45 % IV SOLN
INTRAVENOUS | Status: DC
  Administered 2017-07-15 – 2017-07-17 (×3): via INTRAVENOUS

## 2017-07-15 MED ORDER — DEXTROSE 5 % IV SOLN
1.0000 g | INTRAVENOUS | Status: DC
  Administered 2017-07-15 – 2017-07-17 (×3): 1 g via INTRAVENOUS
  Filled 2017-07-15 (×3): qty 10

## 2017-07-15 NOTE — Progress Notes (Signed)
Physical Therapy Note  Patient Details  Name: Randy Durham MRN: [id number] Date of Birth: 02-05-1934 Today's Date: 07/15/2017  [id number], 45 min individual tx Pt missed 15 min due to illness, poor arousal Pain: pt grimaced during turning  Son [name] assisted PT during session.  Pt had 2 episodes of vomiting overnight.  He has UTI.   Pt was asleep and not arousable using wet wash cloth, verbal cues, tactile cues at start of session.  Bed mobility for donning shorts.  PT donned TEDS. +2 for sitting EOB x 15 minutes, with mod assist>< close supervision for balance with feet supported on floor.  Pt made a few L hand and facial movements while PT attempted arousal using an empty spoon on pt's lower lip.  Pt never opened mouth or moved tongue visibly.  Pt returned to supine with HOB raised 30 degrees, TEDS doffed, and son present in room. PT informed [name], RN that pt was wet.   See function navigator for current status.  [name] 07/15/2017, 7:46 AM

## 2017-07-15 NOTE — Progress Notes (Signed)
                                                                                                                                                                                                         Daily Progress Note   Patient Name: Randy Durham       Date: 07/15/2017 DOB: 03/04/1934  Age: 83 y.o. MRN#: [id number] Attending Physician: Swartz, Zachary T, MD Primary Care Physician: Polite, Ronald, MD Admit Date: 06/30/2017  Reason for Consultation/Follow-up: Establishing goals of care and Psychosocial/spiritual support  Subjective: Patient resting in bed, eyes closed. No distress noted. Son and wife at bedside. They state he has not really participated in therapy and recognize that he is not improving at this time. Son states that they do not want a nasogastric feeding tube or PEG tube placed. They would like IV fluids at this time as his antibiotics have changed to IV Rocephin due to his lack of PO intake. They would like to try the Rocephin for a few days to see if there is any improvement, as antibiotic had to be adjusted yesterday due to culture sensitivities. Son states "there are things worse than death". Wife and son have decided to change to DNR status. Family asking questions about Hospice care, questions answered. Patient would be candidate for inpatient Hospice given his brain injury, failure to thrive, and poor intake. Inpatient hospice appears to be what the family would like if he does not show improvement.      Length of Stay: 15  Current Medications: Scheduled Meds:  . acetaminophen  650 mg Oral Q6H  . amLODipine  2.5 mg Oral Daily  . apixaban  5 mg Oral BID  . atorvastatin  80 mg Oral q1800  . feeding supplement (ENSURE ENLIVE)  237 mL Oral TID BM  . feeding supplement (PRO-STAT SUGAR FREE 64)  30 mL Oral BID  . levothyroxine  112 mcg Per Tube QAC breakfast  . lisinopril  5 mg Oral QHS  . mouth rinse  15 mL Mouth Rinse BID  . methylphenidate  10 mg Oral BID WC  .  metoprolol tartrate  12.5 mg Oral BID  . phenazopyridine  100 mg Oral TID WC  . topiramate  25 mg Oral Daily  . Valproate Sodium  500 mg Oral Q12H    Continuous Infusions: . sodium chloride 50 mL/hr at 07/15/17 1043  . cefTRIAXone (ROCEPHIN)  IV Stopped (07/15/17 1113)    PRN Meds: acetaminophen **OR** acetaminophen (TYLENOL) oral liquid 160 mg/5 mL **OR** acetaminophen, methocarbamol, nitroGLYCERIN, ondansetron **OR** ondansetron (  ZOFRAN) IV, sorbitol  Physical Exam  Constitutional: He appears well-developed.  Holding penis. Grimacing.   Cardiovascular:  Warm and dry.  Pulmonary/Chest: Effort normal. No respiratory distress.  Neurological:  Resting with eyes closed.   Skin: Skin is warm and dry.  Vitals reviewed.           Vital Signs: BP 118/65 (BP Location: Right Arm)   Pulse 88   Temp (!) 97.5 F (36.4 C) (Axillary)   Resp 18   Ht 5' 3" (1.6 m)   Wt 78.8 kg (173 lb 9.8 oz)   SpO2 97%   BMI 30.75 kg/m  SpO2: SpO2: 97 % O2 Device: O2 Device: Not Delivered O2 Flow Rate:    Intake/output summary:  No intake or output data in the 24 hours ending 07/15/17 1345 LBM: Last BM Date: 07/14/17 Baseline Weight: Weight: 81.6 kg (179 lb 14.3 oz) Most recent weight: Weight: 78.8 kg (173 lb 9.8 oz)       Palliative Assessment/Data: 20%.     Flowsheet Rows     Most Recent Value  Intake Tab  Referral Department  Hospitalist  Unit at Time of Referral  Other (Comment)  Palliative Care Primary Diagnosis  Neurology  Date Notified  07/09/17  Palliative Care Type  New Palliative care  Reason for referral  Clarify Goals of Care, Counsel Regarding Hospice  Date of Admission  06/24/17  Date first seen by Palliative Care  07/10/17  # of days Palliative referral response time  1 Day(s)  # of days IP prior to Palliative referral  15  Clinical Assessment  Palliative Performance Scale Score  30%  Pain Max last 24 hours  Not able to report  Pain Min Last 24 hours  Not able to  report  Dyspnea Max Last 24 Hours  Not able to report  Dyspnea Min Last 24 hours  Not able to report  Nausea Max Last 24 Hours  Not able to report  Nausea Min Last 24 Hours  Not able to report  Anxiety Max Last 24 Hours  Not able to report  Anxiety Min Last 24 Hours  Not able to report  Other Max Last 24 Hours  Not able to report  Psychosocial & Spiritual Assessment  Palliative Care Outcomes  Patient/Family meeting held?  Yes  Who was at the meeting?  pt's wife and son, [name]  Palliative Care follow-up planned  Yes, Facility      Patient Active Problem List   Diagnosis Date Noted  . Palliative care by specialist   . Vascular headache   . Lethargy   . AKI (acute kidney injury) ([place])   . Benign essential HTN   . Somnolence   . Intracerebral hemorrhage 06/24/2017  . Hemorrhage of right temporal lobe ([place]) 06/24/2017  . Intraventricular hemorrhage ([place]) 06/24/2017  . Cytotoxic brain edema ([place]) 06/24/2017  . Paroxysmal atrial fibrillation ([place]) 03/30/2015  . Bradycardia 03/27/2015  . NSVT (nonsustained ventricular tachycardia) ([place]) 03/27/2015  . CAD- occluded distal LAD, 75-80% CFX- medical Rx 03/27/2015  . NSTEMI 03/25/15 03/25/2015  . Chronic kidney disease (CKD), stage III (moderate)   . Obesity (BMI 30-39.9)   . Spinal stenosis   . Hypertension   . Hyperlipidemia     Palliative Care Assessment & Plan   Patient Profile: 82 y.o. male  with past medical history of Spinal stenosis, coronary artery disease/non-STEMI/PAF (on Eliquis), hypertension, chronic kidney disease stage III admitted on 06/30/2017 with complaints of a headache, altered mental status,  right-sided weakness. Upon admission, patient was found to have a large right temporal intraparenchymal hemorrhage with intraventricular extension and surrounding edema. Patient underwent a craniotomy for evacuation on 06/24/2017. His  Eliquis was discontinued because of intracranial hemorrhage but low-dose aspirin was initiated.  . Patient was admitted to the inpatient rehabilitation program and had been progressing until approximately a week ago when he sustained a left embolic MCA stroke.  Unfortunately, patient has not been able to progress in inpatient rehabilitation program; remains lethargic, dependent for all ADLs, limited oral intake secondary to lethargy/encephalopathy   Assessment: He does not appear to have pain during visit today. Met with patient's wife and son today. Patient does not appear to be improving as family initially hoped. Plans to treat with IV Rocephin for a few days and then transition to inpatient Hospice if no improvement.     Recommendations/Plan:  Recommend resuming home Flomax when patient is able to swallow pills, or an alternate crushable medication.   Recommend checking BMP for renal function to determine discontinuation/continuation of Pyridium.   MOST form started. No feeding tube placement and DNR status.    Goals of Care and Additional Recommendations:  Limitations on Scope of Treatment: DNR  Code Status:    Code Status Orders        Start     Ordered   06/30/17 1730  Full code  Continuous     06/30/17 1729    Code Status History    Date Active Date Inactive Code Status Order ID Comments User Context   06/30/2017  5:29 PM  Full Code [id number]  Cathlyn Parsons, PA-C Inpatient   06/24/2017 10:09 AM 06/30/2017  5:15 PM Full Code [id number]  Omar Person, NP Inpatient   06/24/2017  2:12 AM 06/24/2017 10:09 AM Full Code [id number]  Kerney Elbe, MD ED   03/26/2015  4:42 PM 03/28/2015  3:53 PM Full Code [id number]  Lorretta Harp, MD Inpatient   03/26/2015  2:11 AM 03/26/2015  4:42 PM Full Code [id number]  Jacolyn Reedy, MD Inpatient    Advance Directive Documentation     Most Recent Value  Type of Advance Directive  Healthcare Power of Attorney  Pre-existing out of facility DNR order (yellow form or pink MOST form)  -  "MOST" Form in Place?  -        Prognosis:  Pt likely would qualify for inpatient hospice if family was interested in pursuing this option given his hemorrhagic CVA s/p craniotomy 5/27, embolic CVA 9/4, failure to thrive, poor PO intake requiring IVF, and UTI requiring antibiotics.   Discharge Planning:  To Be Determined   Thank you for allowing the Palliative Medicine Team to assist in the care of this patient.   Total Time 65 min Prolonged Time Billed  no       Greater than 50%  of this time was spent counseling and coordinating care related to the above assessment and plan.  Asencion Gowda, NP 07/15/2017 1:45 PM Office: [phone] 7am-7pm  Call primary team after hours  Please contact Palliative Medicine Team phone at [phone] for questions and concerns.

## 2017-07-15 NOTE — Progress Notes (Signed)
Occupational Therapy Session Note  Patient Details  Name: Randy Durham MRN: [id number] Date of Birth: 1934/08/19  Today's Date: 07/15/2017 OT Individual Time: 1130-1200 OT Individual Time Calculation (min): 30 min    Short Term Goals: Week 2:  OT Short Term Goal 1 (Week 2): Pt will initiate once self care task within 5 minutes. OT Short Term Goal 2 (Week 2): Pt will perform UB bathing with max A. OT Short Term Goal 3 (Week 2): Pt will sit on EOB for 10 minutes of static sitting with mod A for balance.  Skilled Therapeutic Interventions/Progress Updates:    Upon entering the room, pt supine in bed with son and wife present in room. Supine >sit with total A of 1 to EOB. Sitting for 15 minutes on EOB with max - total A with B feet supported on floor. Drink placed in patients hand and needing assistance to bring straw to mouth. Pt initiated taking sips but only opening eyes once during entire session. Pt returned to bed with +2 assistance for safety. Bed lowered with all needs within reach. Family present.   Therapy Documentation Precautions:  Precautions Precautions: Fall Restrictions Weight Bearing Restrictions: No General:   Vital Signs: Therapy Vitals Temp: (!) 97.5 F (36.4 C) Temp Source: Axillary Pulse Rate: 88 Resp: 18 BP: 118/65 Patient Position (if appropriate): Lying Oxygen Therapy SpO2: 97 % O2 Device: Not Delivered Pain: Pain Assessment Pain Assessment: Faces Pain Score: Asleep Faces Pain Scale: Hurts little more Pain Type: Acute pain Pain Location: Back Pain Orientation: Mid;Lower Pain Descriptors / Indicators: Aching Pain Frequency: Intermittent Pain Onset: Gradual Patients Stated Pain Goal: 0 Pain Intervention(s): Medication (See eMAR) ADL: ADL ADL Comments: refer to functional navigator Vision   Perception    Praxis   Exercises:   Other Treatments:    See Function Navigator for Current Functional Status.   Therapy/Group: Individual  Therapy  Alen BleacherBradsher, Numair Masden P 07/15/2017, 4:51 PM

## 2017-07-15 NOTE — Progress Notes (Signed)
Speech Language Pathology Daily Session Note  Patient Details  Name: Randy Durham MRN: [id number] Date of Birth: 10-30-1934  Today's Date: 07/15/2017 SLP Individual Time: 1400-1425 SLP Individual Time Calculation (min): 25 min  Short Term Goals: Week 2: SLP Short Term Goal 1 (Week 2): Patient will consume current diet with minimal overt s/s of aspiration with Min A verbal cues for use of swallowing compensatory strategies.  SLP Short Term Goal 2 (Week 2): Pt will demonstrate sustained attention to functional task for 2 minutes with Max multimodal cues. SLP Short Term Goal 3 (Week 2): Pt will initate basic familar ADL tasks in 30% of opportunties with Max multimodal cues. SLP Short Term Goal 4 (Week 2): Pt will answer yes/no questions with 30% accuracy with Max A multimodal cues. SLP Short Term Goal 5 (Week 2): Pt will communicate basic wants and needs via multimodal communciation with Max A verbal cues.  Skilled Therapeutic Interventions: Skilled treatment session focused on cognitive goals. Upon arrival, patient was asleep while supine in bed. Patient unable to open his eyes or initiate functional task despite Max A verbal and tactile cues. Family present and educated on safety with PO intake (full liquid diet) during periods of decreased arousal. All verbalized understanding. Patient left supine in bed with family present. Continue with current plan of care.      Function:   Cognition Comprehension Comprehension assist level: Understands basic less than 25% of the time/ requires cueing >75% of the time  Expression   Expression assist level: Expresses basis less than 25% of the time/requires cueing >75% of the time.  Social Interaction Social Interaction assist level: Interacts appropriately less than 25% of the time. May be withdrawn or combative.  Problem Solving Problem solving assist level: Solves basic less than 25% of the time - needs direction nearly all the time or does not  effectively solve problems and may need a restraint for safety  Memory Memory assist level: Recognizes or recalls less than 25% of the time/requires cueing greater than 75% of the time    Pain Pain Assessment Pain Assessment: Faces Pain Score: Asleep Faces Pain Scale: Hurts little more Pain Type: Acute pain Pain Location: Back Pain Orientation: Mid;Lower Pain Descriptors / Indicators: Aching Pain Frequency: Intermittent Pain Onset: Gradual Patients Stated Pain Goal: 0 Pain Intervention(s): Medication (See eMAR)  Therapy/Group: Individual Therapy  Tonatiuh Mallon 07/15/2017, 3:48 PM

## 2017-07-15 NOTE — Progress Notes (Signed)
Called to room by son states patient had a vomiting episode noted moderate amount of brown liquid substance on towel and bed sheet with some coughing noted from patient. HOB was elevated and patient was positioned on left side prior to this episode, patient also noted to have had a large brownish stool.Lung sounds diminished, Incontinent care provided, VS obtained and record. Zofran po adminished with clear liquids and tolerated w/o n/v.Marland Kitchen. Closely monitored. Son remains at bedside.

## 2017-07-15 NOTE — Progress Notes (Signed)
[facility] PHYSICAL MEDICINE & REHABILITATION     PROGRESS NOTE    Subjective/Complaints: Son at bedside. Restless night likely due to bladder, holding privates.. Eyes closed when I was in room.   ROS: pt denies nausea, vomiting, diarrhea, cough, shortness of breath or chest pain    Objective: Vital Signs: Blood pressure (!) 131/91, pulse 100, temperature (!) 97.5 F (36.4 C), temperature source Oral, resp. rate 20, height  (1.6 m), weight 78.8 kg (173 lb 9.8 oz), SpO2 99 %. No results found. No results for input(s): WBC, HGB, HCT, PLT in the last 72 hours.  Recent Labs  07/13/17 0555 07/14/17 0724  NA 142 140  K 3.7 4.2  CL 109 108  GLUCOSE 96 116*  BUN 38* 37*  CREATININE 1.06 1.28*  CALCIUM 9.3 9.3   CBG (last 3)  No results for input(s): GLUCAP in the last 72 hours.  Wt Readings from Last 3 Encounters:  07/15/17 78.8 kg (173 lb 9.8 oz)  06/30/17 81.6 kg (180 lb)  03/25/17 82.1 kg (181 lb)    Physical Exam:  Constitutional: No distress. Vital signs reviewed HENT: Normocephalic.  Eyes: opens eyes intermittently to verbal cues Cardiovascular: RRR without murmur. No JVD  Respiratory:  CTA Bilaterally without wheezes or rales. Normal effort         GI: Bowel sounds are normal. He exhibits no distension. Skin: Skin is warm and dry. Craniotomy site clean and dry Neurological:   Eyes closed. Minimal interaction with examiner.      Psych:  somnolent  Assessment/Plan: 1. Functional and cognitive deficits secondary to right temporal ICH which require 3+ hours per day of interdisciplinary therapy in a comprehensive inpatient rehab setting. Physiatrist is providing close team supervision and 24 hour management of active medical problems listed below. Physiatrist and rehab team continue to assess barriers to discharge/monitor patient progress toward functional and medical goals.  Function:  Bathing Bathing position   Position: Shower  Bathing parts   Body  parts bathed by helper: Right arm, Left arm, Chest, Abdomen, Front perineal area, Buttocks, Right upper leg, Left upper leg, Right lower leg, Left lower leg, Back  Bathing assist Assist Level: 2 helpers      Upper Body Dressing/Undressing Upper body dressing   What is the patient wearing?: Pull over shirt/dress       Pull over shirt/dress - Perfomed by helper: Thread/unthread left sleeve, Put head through opening, Pull shirt over trunk, Thread/unthread right sleeve        Upper body assist Assist Level: 2 helpers      Lower Body Dressing/Undressing Lower body dressing   What is the patient wearing?: Pants       Pants- Performed by helper: Thread/unthread right pants leg, Thread/unthread left pants leg, Pull pants up/down, Fasten/unfasten pants   Non-skid slipper socks- Performed by helper: Don/doff right sock, Don/doff left sock       Shoes - Performed by helper: Don/doff right shoe, Don/doff left shoe       TED Hose - Performed by helper: Don/doff right TED hose, Don/doff left TED hose  Lower body assist Assist for lower body dressing: 2 Helpers      Toileting Toileting Toileting activity did not occur: No continent bowel/bladder event   Toileting steps completed by helper: Adjust clothing prior to toileting, Performs perineal hygiene, Adjust clothing after toileting    Toileting assist Assist level: Two helpers   Transfers Chair/bed transfer   Chair/bed transfer method: Other Chair/bed  transfer assist level: 2 helpers Chair/bed transfer assistive device: Systems developerMechanical lift Mechanical lift: Air traffic controllerMaxisky   Locomotion Ambulation Ambulation activity did not occur: Safety/medical concerns (fatigue)   Max distance: 20 ft Assist level: 2 helpers   Education administratorWheelchair Wheelchair activity did not occur: Safety/medical concerns     Assist Level: Dependent (Pt equals 0%)  Cognition Comprehension Comprehension assist level: Understands basic less than 25% of the time/ requires  cueing >75% of the time  Expression Expression assist level: Expresses basis less than 25% of the time/requires cueing >75% of the time.  Social Interaction Social Interaction assist level: Interacts appropriately less than 25% of the time. May be withdrawn or combative.  Problem Solving Problem solving assist level: Solves basic less than 25% of the time - needs direction nearly all the time or does not effectively solve problems and may need a restraint for safety  Memory Memory assist level: Recognizes or recalls less than 25% of the time/requires cueing greater than 75% of the time   Medical Problem List and Plan: 1. Decreased functional mobilitysecondary to right temporal ICH with IVH status post craniotomy for hematoma evacuation 06/24/2017 -appreciate Palliative care assistance.   -spoke at length with son. Family is leaning toward hospice but hesitant still somewhat due to UTI. I told the son that we would attempt to aggressively treat the UTI (IV rocephin) and add IVF also to see if he "perks up" at all. This hopefully will help their decision making process. We both agree that a PEG is not appropriate or what Mr. Bugge would want 3. Pain Management/back pain: Discontinue hydrocodone due to somnolence.   Monitor mental status  Topamax started 9/2--- HS schedule. I'm not convinced that he needs more topamax. Bladder is major source of discomfort 4. Mood:dc amantadine---change to ritalin beginning at noon today 5. Neuropsych: This patient is notcapable of making decisions on hisown behalf. -continue sleep chart -limiting neuro-sedating medications. off Seroquel  -stimulate during day with ritalin 6. Skin/Wound Care: Routine skin checks 7. Fluids/Electrolytes/Nutrition: encourage PO as possible      -cal ct  -AKI, Bun/Cr stable yesterday  -family pushing fluids/full liquid diet 8.Seizure prophylaxis. Valproate 500 mg every 12 hours. EEG  negative  -VPA level 55 on 8/29 9.CAD with cardiac catheterization 03/26/2015. Continue low-dose aspirin. 10.Hypertension/PAF. Lisinopril 5 mg daily, Lopressor 12.5 mg twice a day.   Norvasc 2.5 started on 9/1 to limit renal effects  BP controlled at present 11.Hyperlipidemia. Lipitor 12.Hypothyroidism. Synthroid. Follow up levels as outpt.  13. UTI proteus: still symptomatic  -change amoxil to rocephin (see abovfe)   Greater than 50% of time during this encounter was spent counseling patient/family in regard to care plan.  .    LOS (Days) 15 A FACE TO FACE EVALUATION WAS PERFORMED  Ranelle OysterSWARTZ,ZACHARY T, MD 07/15/2017 9:07 AM

## 2017-07-16 ENCOUNTER — Inpatient Hospital Stay (HOSPITAL_COMMUNITY): Payer: Medicare Other | Admitting: Speech Pathology

## 2017-07-16 ENCOUNTER — Inpatient Hospital Stay (HOSPITAL_COMMUNITY): Payer: Medicare Other | Admitting: Occupational Therapy

## 2017-07-16 ENCOUNTER — Inpatient Hospital Stay (HOSPITAL_COMMUNITY): Payer: Medicare Other | Admitting: Physical Therapy

## 2017-07-16 LAB — BASIC METABOLIC PANEL
ANION GAP: 7 (ref 5–15)
BUN: 41 mg/dL — ABNORMAL HIGH (ref 6–20)
CALCIUM: 9.1 mg/dL (ref 8.9–10.3)
CO2: 27 mmol/L (ref 22–32)
CREATININE: 1.35 mg/dL — AB (ref 0.61–1.24)
Chloride: 104 mmol/L (ref 101–111)
GFR, EST AFRICAN AMERICAN: 55 mL/min — AB (ref >60)
GFR, EST NON AFRICAN AMERICAN: 48 mL/min — AB (ref >60)
Glucose, Bld: 101 mg/dL — ABNORMAL HIGH (ref 65–99)
Potassium: 4.1 mmol/L (ref 3.5–5.1)
Sodium: 138 mmol/L (ref 135–145)

## 2017-07-16 LAB — PREALBUMIN: PREALBUMIN: 30.2 mg/dL (ref 18–38)

## 2017-07-16 MED ORDER — VALPROATE SODIUM 250 MG/5ML PO SOLN
500.0000 mg | Freq: Two times a day (BID) | ORAL | Status: DC
  Administered 2017-07-16 – 2017-07-22 (×13): 500 mg via ORAL
  Filled 2017-07-16 (×15): qty 10

## 2017-07-16 NOTE — Progress Notes (Signed)
Occupational Therapy Session Note  Patient Details  Name: Randy Durham MRN: [id number] Date of Birth: Mar 21, 1934  Today's Date: 07/16/2017 OT Individual Time: 1300-1330 OT Individual Time Calculation (min): 30 min    Short Term Goals: Week 2:  OT Short Term Goal 1 (Week 2): Pt will initiate once self care task within 5 minutes. OT Short Term Goal 2 (Week 2): Pt will perform UB bathing with max A. OT Short Term Goal 3 (Week 2): Pt will sit on EOB for 10 minutes of static sitting with mod A for balance.  Skilled Therapeutic Interventions/Progress Updates:     Upon entering the room, pt's family members present attempting to change pt in bed. Pt required +2 assistance for safety with hygiene, rolling L <> R, and management of brief. Pt keeping eyes closed throughout session. OT repositioned pt in bed and lowered bed for safety. Call bell and all needed items within reach upon exiting the room.   Therapy Documentation Precautions:  Precautions Precautions: Fall Restrictions Weight Bearing Restrictions: No General:   Vital Signs: Therapy Vitals Temp: 97.8 F (36.6 C) Temp Source: Axillary Pulse Rate: 94 Resp: 20 BP: 140/74 Patient Position (if appropriate): Lying Oxygen Therapy SpO2: 97 % O2 Device: Not Delivered Pain: Pain Assessment Pain Score: Asleep ADL: ADL ADL Comments: refer to functional navigator Vision   Perception    Praxis   Exercises:   Other Treatments:    See Function Navigator for Current Functional Status.   Therapy/Group: Individual Therapy  Alen BleacherBradsher, Delana Manganello P 07/16/2017, 5:26 PM

## 2017-07-16 NOTE — Progress Notes (Signed)
Speech Language Pathology Daily Session Note  Patient Details  Name: Randy Durham MRN: [id number] Date of Birth: 06-06-1934  Today's Date: 07/16/2017 SLP Individual Time: 0830-0900 SLP Individual Time Calculation (min): 30 min  Short Term Goals: Week 3: SLP Short Term Goal 1 (Week 3): Pt will communicate basic wants and needs via multimodal communciation with Mod A verbal cues. SLP Short Term Goal 2 (Week 3): Pt will answer yes/no questions in 10% of opportunities with Max A multimodal cues in regards to wants/needs. SLP Short Term Goal 3 (Week 3): Patient will keep his eyes open for ~2 minute intervals with Max A multimodal cues.  SLP Short Term Goal 4 (Week 3): Pt will initate self-feeding of liquids in 50% of opportunties with Max multimodal cues. SLP Short Term Goal 5 (Week 3): Patient will consume thin liquids without overt s/s of aspiration with Max A verbal cues.   Skilled Therapeutic Interventions: Skilled treatment session focused on dysphagia and cognition goals.  Pt received in bed without brief in place d/t it being wet. Son present. SLP facilitated bed mobility tasks with Total A mobility. Pt alert for session with some verbalizations but his were unintelligible. Pt consumed thin liquids via straw with no overt s/s of aspiration. Pt left upright in bed and son present. Continue per current plan of care.      Function:  Eating Eating   Modified Consistency Diet: Yes Eating Assist Level: Helper feeds patient           Cognition Comprehension Comprehension assist level: Understands basic less than 25% of the time/ requires cueing >75% of the time  Expression   Expression assist level: Expresses basis less than 25% of the time/requires cueing >75% of the time.  Social Interaction Social Interaction assist level: Interacts appropriately less than 25% of the time. May be withdrawn or combative.  Problem Solving Problem solving assist level: Solves basic less than 25% of the  time - needs direction nearly all the time or does not effectively solve problems and may need a restraint for safety  Memory Memory assist level: Recognizes or recalls less than 25% of the time/requires cueing greater than 75% of the time    Pain    Therapy/Group: Individual Therapy  Jovana Rembold 07/16/2017, 8:57 AM

## 2017-07-16 NOTE — Progress Notes (Signed)
Physical Therapy Session Note  Patient Details  Name: Randy Durham MRN: [id number] Date of Birth: 1934-07-17  Today's Date: 07/16/2017 PT Individual Time: 1001-1031 PT Individual Time Calculation (min): 30 min   Short Term Goals: Week 3:  PT Short Term Goal 1 (Week 3): Pt will perform rolling in bed L<>R with hospital bed features and max assist +1.  PT Short Term Goal 2 (Week 3): Pt will transfer supine<>sitting EOB with max assist +1.  PT Short Term Goal 3 (Week 3): Pt will transfer bed<>chair with max assist +1.  Skilled Therapeutic Interventions/Progress Updates:  Pt received in room with son [name] present for session. Pt requires dependent assist for donning gown and ted hose as pt only wearing brief upon PT arrival. Pt requires +2 dependent assist for rolling L<>R for sling placement and transfer bed>TIS w/c via maxisky for safety 2/2 pt's lethargy and decreased arousal. Pt requires dependent assist for repositioning in chair but continues to push R with LUE; therapist unable to redirect pt. Attempted to have pt assist with combing hair, washing face with cold cloth, and donning glasses but pt unable to initiate despite HOH assist and total cuing. In dayroom pt unable to focus on therapist or follow one step commands. Pt unable to state his name nor wife's name. Pt becoming more difficult to arouse and would not open eyes even with sternal rub but would make noises. At end of session pt left sitting in TIS w/c in room with son present to supervise.   Therapy Documentation Precautions:  Precautions Precautions: Fall Restrictions Weight Bearing Restrictions: No  See Function Navigator for Current Functional Status.   Therapy/Group: Individual Therapy  Sandi MariscalVictoria M Rommel Hogston 07/16/2017, 4:32 PM

## 2017-07-16 NOTE — Progress Notes (Signed)
Social Work Patient ID: Randy Durham, male   DOB: 1934/07/16, 83 y.o.   MRN: [id number]   Randy Durham, Randy DatesJennifer C, LCSW Social Worker Signed   Patient Care Conference Date of Service: 07/16/2017 10:05 AMKarlton Durham      Hide copied text Hover for attribution information Inpatient RehabilitationTeam Conference and Plan of Care Update Date: 07/14/2017   Time: 2:30 PM      Patient Name: Randy Durham      Medical Record Number: [id number]  Date of Birth: 1934/07/16 Sex: Male         Room/Bed: 4W15C/4W15C-01 Payor Info: Payor: MEDICARE / Plan: MEDICARE PART A AND B / Product Type: *No Product type* /     Admitting Diagnosis: Crani  Admit Date/Time:  06/30/2017  5:15 PM Admission Comments: No comment available    Primary Diagnosis:  Hemorrhage of right temporal lobe (HCC) Principal Problem: Hemorrhage of right temporal lobe [facility](HCC)       Patient Active Problem List    Diagnosis Date Noted  . Palliative care by specialist    . Vascular headache    . Lethargy    . AKI (acute kidney injury) (HCC)    . Benign essential HTN    . Somnolence    . Intracerebral hemorrhage 06/24/2017  . Hemorrhage of right temporal lobe (HCC) 06/24/2017  . Intraventricular hemorrhage (HCC) 06/24/2017  . Cytotoxic brain edema (HCC) 06/24/2017  . Paroxysmal atrial fibrillation (HCC) 03/30/2015  . Bradycardia 03/27/2015  . NSVT (nonsustained ventricular tachycardia) (HCC) 03/27/2015  . CAD- occluded distal LAD, 75-80% CFX- medical Rx 03/27/2015  . NSTEMI 03/25/15 03/25/2015  . Chronic kidney disease (CKD), stage III (moderate)    . Obesity (BMI 30-39.9)    . Spinal stenosis    . Hypertension    . Hyperlipidemia        Expected Discharge Date: Expected Discharge Date:  (SNF)   Team Members Present: Physician leading conference: Dr. Faith RogueZachary Swartz Social Worker Present: Staci AcostaJenny Nyellie Yetter, LCSW Nurse Present: Tennis MustLuz Rosero, RN PT Present: Aleda GranaVictoria Miller, PT OT Present: Callie FieldingKatie Pittman, OT SLP Present: Feliberto Gottronourtney Payne,  SLP PPS Coordinator present : Edson SnowballBecky Windsor, PT       Current Status/Progress Goal Weekly Team Focus  Medical     minimal progress. after consultation with neurology he is now back on eliquis. nutritional status margina  improve arousal  nutrition, arousal, family ed, establishing goals of care   Bowel/Bladder     Incont, of B/B LBM 07/14/17  monitor for continent episodes and record   monitor for changes in D/D staus    Swallow/Nutrition/ Hydration     Full liquid, Max-Total A   Max A  tolerance of current diet    ADL's     dependent overall  max A overall to reduce caregiver burden  alertness, cognition, family educaiton, initiation, orientation, endurance   Mobility     mechanical lift bed<>chair, +2 assist rolling in bed, poor awareness & cognition  max assist overall  family education, orientation, one step commands, activity tolerance   Communication     Total A  Max A  basic expression of wants/needs    Safety/Cognition/ Behavioral Observations   Total A  Max A  initation, attention    Pain     appears with discomfort to back area has order for Robaxin administered and appeared nore restful during shift  score < 2-3 per family      Skin     scattered cruisinfg , bo new areas ,  stable removed operative site healed no drainage  no skin issues  assess qs      Rehab Goals Patient on target to meet rehab goals: No Rehab Goals Revised: Pt's goals have been downgraded due to pt's medical condition. *See Care Plan and progress notes for long and short-term goals.      Barriers to Discharge   Current Status/Progress Possible Resolutions Date Resolved   Physician     Medical stability        maximize nutrition, diet changed to all liquid to maximize intake, alternative means of nutrition      Nursing                 PT  Inaccessible home environment  14 steps to enter apartment              OT                 SLP Medical stability            SW              Discharge  Planning/Teaching Needs:  Pt's family open to team's recommendation for d/Durham disposition plan.  SNF vs. hospice, depending on pt's progress.  Pt's family is at bedside regularly.   Team Discussion:  Pt with UTI now and antibiotics are being adjusted to cultures received.  Dr. Riley Kill talked with family extensively about nutrition and hydration and the importance of family encouraging fluids, especially since pt's cognition prevents him from being able to manage this himself.  Pt is back on a full liquid diet per ST and has visual deficits.  PT is providing family with a lot of education.  OT is using music in their sessions and pt is responding to this.  RN and team are pleased palliative care is involved.    Revisions to Treatment Plan:  Pt's therapy frequency was changed to qd.    Continued Need for Acute Rehabilitation Level of Care: The patient requires daily medical management by a physician with specialized training in physical medicine and rehabilitation for the following conditions: Daily direction of a multidisciplinary physical rehabilitation program to ensure safe treatment while eliciting the highest outcome that is of practical value to the patient.: Yes Daily medical management of patient stability for increased activity during participation in an intensive rehabilitation regime.: Yes Daily analysis of laboratory values and/or radiology reports with any subsequent need for medication adjustment of medical intervention for : Neurological problems;Nutritional problems   Khalilah Hoke, Vista Deck 07/16/2017, 10:06 AM

## 2017-07-16 NOTE — Progress Notes (Signed)
Daily Progress Note   Patient Name: Randy Durham       Date: 07/16/2017 DOB: 1934/09/02  Age: 83 y.o. MRN#: [id number] Attending Physician: Ranelle OysterSwartz, Zachary T, MD Primary Care Physician: Renford DillsPolite, Ronald, MD Admit Date: 06/30/2017  Reason for Consultation/Follow-up: Establishing goals of care and Psychosocial/spiritual support  Subjective: Patient resting in bed, he is awake and looking around, speaking a few times unintelligably. No distress noted. He is fidgeting with equipment and sheets.  Son and wife at bedside. Patient showing alertness, family still believes they would not want a PEG, but now is unsure if they would want that to be a definitive decision. They do want to continue DNR status.   Length of Stay: 16  Current Medications: Scheduled Meds:  . acetaminophen  650 mg Oral Q6H  . amLODipine  2.5 mg Oral Daily  . apixaban  5 mg Oral BID  . atorvastatin  80 mg Oral q1800  . feeding supplement (ENSURE ENLIVE)  237 mL Oral TID BM  . feeding supplement (PRO-STAT SUGAR FREE 64)  30 mL Oral BID  . levothyroxine  112 mcg Per Tube QAC breakfast  . lisinopril  5 mg Oral QHS  . mouth rinse  15 mL Mouth Rinse BID  . methylphenidate  10 mg Oral BID WC  . metoprolol tartrate  12.5 mg Oral BID  . phenazopyridine  100 mg Oral TID WC  . topiramate  25 mg Oral Daily  . Valproate Sodium  500 mg Oral BID    Continuous Infusions: . sodium chloride 50 mL/hr at 07/16/17 0744  . cefTRIAXone (ROCEPHIN)  IV Stopped (07/16/17 1020)    PRN Meds: acetaminophen **OR** acetaminophen (TYLENOL) oral liquid 160 mg/5 mL **OR** acetaminophen, methocarbamol, nitroGLYCERIN, ondansetron **OR** ondansetron (ZOFRAN) IV, sorbitol  Physical Exam  Constitutional: He appears well-developed.  HENT:  Head:  Normocephalic.  Cardiovascular:  Warm and dry.  Pulmonary/Chest: Effort normal. No respiratory distress.  Neurological:  Resting with eyes closed.   Skin: Skin is warm and dry.  Vitals reviewed.           Vital Signs: BP 140/74 (BP Location: Right Arm)   Pulse 94   Temp 97.8 F (36.6 C) (Axillary)   Resp 20   Ht 5\' 3"  (1.6 m)   Wt 68.9 kg (152 lb)   SpO2  97%   BMI 26.93 kg/m  SpO2: SpO2: 97 % O2 Device: O2 Device: Not Delivered O2 Flow Rate:    Intake/output summary:   Intake/Output Summary (Last 24 hours) at 07/16/17 1455 Last data filed at 07/15/17 1800  Gross per 24 hour  Intake           534.17 ml  Output                0 ml  Net           534.17 ml   LBM: Last BM Date: 07/14/17 Baseline Weight: Weight: 81.6 kg (179 lb 14.3 oz) Most recent weight: Weight: 68.9 kg (152 lb)       Palliative Assessment/Data: 20%.     Flowsheet Rows     Most Recent Value  Intake Tab  Referral Department  Hospitalist  Unit at Time of Referral  Other (Comment)  Palliative Care Primary Diagnosis  Neurology  Date Notified  07/09/17  Palliative Care Type  New Palliative care  Reason for referral  Clarify Goals of Care, Counsel Regarding Hospice  Date of Admission  06/24/17  Date first seen by Palliative Care  07/10/17  # of days Palliative referral response time  1 Day(s)  # of days IP prior to Palliative referral  15  Clinical Assessment  Palliative Performance Scale Score  30%  Pain Max last 24 hours  Not able to report  Pain Min Last 24 hours  Not able to report  Dyspnea Max Last 24 Hours  Not able to report  Dyspnea Min Last 24 hours  Not able to report  Nausea Max Last 24 Hours  Not able to report  Nausea Min Last 24 Hours  Not able to report  Anxiety Max Last 24 Hours  Not able to report  Anxiety Min Last 24 Hours  Not able to report  Other Max Last 24 Hours  Not able to report  Psychosocial & Spiritual Assessment  Palliative Care Outcomes  Patient/Family meeting  held?  Yes  Who was at the meeting?  pt's wife and son, [name]  Palliative Care follow-up planned  Yes, Facility      Patient Active Problem List   Diagnosis Date Noted  . Palliative care by specialist   . Vascular headache   . Lethargy   . AKI (acute kidney injury) (HCC)   . Benign essential HTN   . Somnolence   . Intracerebral hemorrhage 06/24/2017  . Hemorrhage of right temporal lobe (HCC) 06/24/2017  . Intraventricular hemorrhage (HCC) 06/24/2017  . Cytotoxic brain edema (HCC) 06/24/2017  . Paroxysmal atrial fibrillation (HCC) 03/30/2015  . Bradycardia 03/27/2015  . NSVT (nonsustained ventricular tachycardia) (HCC) 03/27/2015  . CAD- occluded distal LAD, 75-80% CFX- medical Rx 03/27/2015  . NSTEMI 03/25/15 03/25/2015  . Chronic kidney disease (CKD), stage III (moderate)   . Obesity (BMI 30-39.9)   . Spinal stenosis   . Hypertension   . Hyperlipidemia     Palliative Care Assessment & Plan   Patient Profile: 83 y.o. male  with past medical history of Spinal stenosis, coronary artery disease/non-STEMI/PAF (on Eliquis), hypertension, chronic kidney disease stage III admitted on 06/30/2017 with complaints of a headache, altered mental status, right-sided weakness. Upon admission, patient was found to have a large right temporal intraparenchymal hemorrhage with intraventricular extension and surrounding edema. Patient underwent a craniotomy for evacuation on 06/24/2017. His  Eliquis was discontinued because of intracranial hemorrhage but low-dose aspirin was initiated. . Patient was  admitted to the inpatient rehabilitation program and had been progressing until approximately a week ago when he sustained a left embolic MCA stroke.  Unfortunately, patient has not been able to progress in inpatient rehabilitation program; remains lethargic, dependent for all ADLs, limited oral intake secondary to lethargy/encephalopathy   Assessment: IV fluid in place, Rocephin in place. Patient more  alert.   Recommendations/Plan:  Recommend resuming home Flomax when patient is able to swallow pills, or an alternate crushable medication.   Recommend checking BMP for renal function to determine discontinuation/continuation of Pyridium.   MOST form shredded today as they do not want feeding tube decision to be definant, though they do not want one at this time. They do want to continue DNR status.    Goals of Care and Additional Recommendations:  Limitations on Scope of Treatment: DNR  Code Status:    Code Status Orders        Start     Ordered   06/30/17 1730  Full code  Continuous     06/30/17 1729    Code Status History    Date Active Date Inactive Code Status Order ID Comments User Context   06/30/2017  5:29 PM  Full Code [id number]  Charlton Amor, PA-C Inpatient   06/24/2017 10:09 AM 06/30/2017  5:15 PM Full Code [id number]  Tobey Grim, NP Inpatient   06/24/2017  2:12 AM 06/24/2017 10:09 AM Full Code [id number]  Caryl Pina, MD ED   03/26/2015  4:42 PM 03/28/2015  3:53 PM Full Code [id number]  Runell Gess, MD Inpatient   03/26/2015  2:11 AM 03/26/2015  4:42 PM Full Code [id number]  Othella Boyer, MD Inpatient    Advance Directive Documentation     Most Recent Value  Type of Advance Directive  Healthcare Power of Attorney  Pre-existing out of facility DNR order (yellow form or pink MOST form)  -  "MOST" Form in Place?  -       Prognosis:  Pt likely would qualify for inpatient hospice if family was interested in pursuing this option given his hemorrhagic CVA s/p craniotomy 8/22, embolic CVA 9/4, failure to thrive, poor PO intake requiring IVF, and UTI requiring antibiotics.   Discharge Planning:  To Be Determined   Thank you for allowing the Palliative Medicine Team to assist in the care of this patient.   Total Time 50 min Prolonged Time Billed  no       Greater than 50%  of this time was spent counseling and coordinating care related to the  above assessment and plan.  Morton Stall, NP 07/16/2017 2:55 PM Office: [phone] 7am-7pm  Call primary team after hours  Please contact Palliative Medicine Team phone at [phone] for questions and concerns.

## 2017-07-16 NOTE — Progress Notes (Signed)
Social Work Patient ID: Randy Durham, male   DOB: [birth year], 83 y.o.   MRN: [id number]   CSW met with pt and pt's wife and son to update them on team conference discussion.  Pt was visibly uncomfortable and RN and family were trying to reposition him and give him medication to help.  Family is very conflicted about what the best plan for pt is.  They want to treat the UTI and see if this perks him up and he is able to participate in therapies and have some quality of life.  If so, then they would pursue SNF for further rehab.  However, if this is not a possibility, then they would be open to hospice care at home or in a hospice facility, knowing that pt would not want to live if he cannot do the things he loves to do.  CSW told them that CSW will be available to support them with whatever they choose to do.

## 2017-07-16 NOTE — Progress Notes (Signed)
[facility] PHYSICAL MEDICINE & REHABILITATION     PROGRESS NOTE    Subjective/Complaints: Son feels he was more alert later in the day yesterday. More comfortable last night to with less bladder irritation.   ROS: Limited due to cognitive/behavioral   Objective: Vital Signs: Blood pressure 128/72, pulse 84, temperature 97.9 F (36.6 C), temperature source Axillary, resp. rate 18, height 5\' 3"  (1.6 m), weight 68.9 kg (152 lb), SpO2 97 %. No results found. No results for input(s): WBC, HGB, HCT, PLT in the last 72 hours.  Recent Labs  07/14/17 0724 07/16/17 0555  NA 140 138  K 4.2 4.1  CL 108 104  GLUCOSE 116* 101*  BUN 37* 41*  CREATININE 1.28* 1.35*  CALCIUM 9.3 9.1   CBG (last 3)  No results for input(s): GLUCAP in the last 72 hours.  Wt Readings from Last 3 Encounters:  07/16/17 68.9 kg (152 lb)  06/30/17 81.6 kg (180 lb)  03/25/17 82.1 kg (181 lb)    Physical Exam:  Constitutional: No distress. Vital signs reviewed HENT: Normocephalic.  Eyes: opens eyes intermittently to verbal cues Cardiovascular: RRR without murmur. No JVD   Respiratory: CTA Bilaterally without wheezes or rales. Normal effort          GI: Bowel sounds are normal. He exhibits no distension. Skin: Skin is warm and dry. Craniotomy site clean and dry Neurological:   Eyes closed. Generalized response to tactile/painful stim.  Routinely grabs for penis    Psych:  somnolent  Assessment/Plan: 1. Functional and cognitive deficits secondary to right temporal ICH which require 3+ hours per day of interdisciplinary therapy in a comprehensive inpatient rehab setting. Physiatrist is providing close team supervision and 24 hour management of active medical problems listed below. Physiatrist and rehab team continue to assess barriers to discharge/monitor patient progress toward functional and medical goals.  Function:  Bathing Bathing position   Position: Shower  Bathing parts   Body parts bathed by  helper: Right arm, Left arm, Chest, Abdomen, Front perineal area, Buttocks, Right upper leg, Left upper leg, Right lower leg, Left lower leg, Back  Bathing assist Assist Level: 2 helpers      Upper Body Dressing/Undressing Upper body dressing   What is the patient wearing?: Pull over shirt/dress       Pull over shirt/dress - Perfomed by helper: Thread/unthread left sleeve, Put head through opening, Pull shirt over trunk, Thread/unthread right sleeve        Upper body assist Assist Level: 2 helpers      Lower Body Dressing/Undressing Lower body dressing   What is the patient wearing?: Pants       Pants- Performed by helper: Thread/unthread right pants leg, Thread/unthread left pants leg, Pull pants up/down, Fasten/unfasten pants   Non-skid slipper socks- Performed by helper: Don/doff right sock, Don/doff left sock       Shoes - Performed by helper: Don/doff right shoe, Don/doff left shoe       TED Hose - Performed by helper: Don/doff right TED hose, Don/doff left TED hose  Lower body assist Assist for lower body dressing: 2 Helpers      Toileting Toileting Toileting activity did not occur: No continent bowel/bladder event   Toileting steps completed by helper: Adjust clothing prior to toileting, Performs perineal hygiene, Adjust clothing after toileting    Toileting assist Assist level: Two helpers   Transfers Chair/bed transfer   Chair/bed transfer method: Other Chair/bed transfer assist level: 2 helpers Chair/bed transfer assistive  device: Mechanical lift Mechanical lift: Maxisky   Locomotion Ambulation Ambulation activity did not occur: Safety/medical concerns (fatigue)   Max distance: 20 ft Assist level: 2 helpers   Education administrator activity did not occur: Safety/medical concerns     Assist Level: Dependent (Pt equals 0%)  Cognition Comprehension Comprehension assist level: Understands basic less than 25% of the time/ requires cueing >75% of the  time  Expression Expression assist level: Expresses basis less than 25% of the time/requires cueing >75% of the time.  Social Interaction Social Interaction assist level: Interacts appropriately less than 25% of the time. May be withdrawn or combative.  Problem Solving Problem solving assist level: Solves basic less than 25% of the time - needs direction nearly all the time or does not effectively solve problems and may need a restraint for safety  Memory Memory assist level: Recognizes or recalls less than 25% of the time/requires cueing greater than 75% of the time   Medical Problem List and Plan: 1. Decreased functional mobilitysecondary to right temporal ICH with IVH status post craniotomy for hematoma evacuation 06/24/2017 -appreciate Palliative care assistance.   -  Family is leaning toward hospice but hesitant still somewhat due to UTI. I will continue to  aggressively treat the UTI (IV rocephin) and add IVF also to see if he "perks up" at all over the next 1-2 days. This hopefully will help their decision making process. We both agree that a PEG is not appropriate or what Mr. Balcom would want 3. Pain Management/back pain: Discontinue hydrocodone due to somnolence.   Monitor mental status  Topamax started 9/2--- HS schedule. I'm not convinced that he needs more topamax. Bladder is major source of discomfort 4. Mood:dc amantadine---change to ritalin beginning at noon today 5. Neuropsych: This patient is notcapable of making decisions on hisown behalf. -continue sleep chart -limiting neuro-sedating medications. off Seroquel  -stimulate during day with ritalin 6. Skin/Wound Care: Routine skin checks 7. Fluids/Electrolytes/Nutrition: encourage PO as possible      -cal ct  -AKI, Bun/Cr a little more elevated despite IVF  -bmet in am 8.Seizure prophylaxis. Valproate 500 mg every 12 hours. EEG negative  -VPA level 55 on 8/29 9.CAD with cardiac  catheterization 03/26/2015. Continue low-dose aspirin. 10.Hypertension/PAF. Lisinopril 5 mg daily, Lopressor 12.5 mg twice a day.   Norvasc 2.5 started on 9/1 to limit renal effects  BP controlled at present 11.Hyperlipidemia. Lipitor 12.Hypothyroidism. Synthroid. Follow up levels as outpt.  13. UTI proteus: still symptomatic  -changed amoxil to rocephin (see above)   Greater than 50% of time during this encounter was spent counseling patient/family in regard to care plan.  .    LOS (Days) 16 A FACE TO FACE EVALUATION WAS PERFORMED  [name], MD 07/16/2017 9:39 AM

## 2017-07-16 NOTE — Patient Care Conference (Signed)
Inpatient RehabilitationTeam Conference and Plan of Care Update Date: 07/14/2017   Time: 2:30 PM    Patient Name: Randy Durham      Medical Record Number: [id number]  Date of Birth: 08-14-1934 Sex: Male         Room/Bed: 4W15C/4W15C-01 Payor Info: Payor: MEDICARE / Plan: MEDICARE PART A AND B / Product Type: *No Product type* /    Admitting Diagnosis: Crani  Admit Date/Time:  06/30/2017  5:15 PM Admission Comments: No comment available   Primary Diagnosis:  Hemorrhage of right temporal lobe (HCC) Principal Problem: Hemorrhage of right temporal lobe [facility](HCC)  Patient Active Problem List   Diagnosis Date Noted  . Palliative care by specialist   . Vascular headache   . Lethargy   . AKI (acute kidney injury) (HCC)   . Benign essential HTN   . Somnolence   . Intracerebral hemorrhage 06/24/2017  . Hemorrhage of right temporal lobe (HCC) 06/24/2017  . Intraventricular hemorrhage (HCC) 06/24/2017  . Cytotoxic brain edema (HCC) 06/24/2017  . Paroxysmal atrial fibrillation (HCC) 03/30/2015  . Bradycardia 03/27/2015  . NSVT (nonsustained ventricular tachycardia) (HCC) 03/27/2015  . CAD- occluded distal LAD, 75-80% CFX- medical Rx 03/27/2015  . NSTEMI 03/25/15 03/25/2015  . Chronic kidney disease (CKD), stage III (moderate)   . Obesity (BMI 30-39.9)   . Spinal stenosis   . Hypertension   . Hyperlipidemia     Expected Discharge Date: Expected Discharge Date:  (SNF)  Team Members Present: Physician leading conference: Dr. Faith RogueZachary Swartz Social Worker Present: Staci AcostaJenny Fallen Crisostomo, LCSW Nurse Present: Tennis MustLuz Rosero, RN PT Present: Aleda GranaVictoria Miller, PT OT Present: Callie FieldingKatie Pittman, OT SLP Present: Feliberto Gottronourtney Payne, SLP PPS Coordinator present : Edson SnowballBecky Windsor, PT     Current Status/Progress Goal Weekly Team Focus  Medical   minimal progress. after consultation with neurology he is now back on eliquis. nutritional status margina  improve arousal  nutrition, arousal, family ed, establishing goals of  care   Bowel/Bladder   Incont, of B/B LBM 07/14/17  monitor for continent episodes and record   monitor for changes in D/D staus    Swallow/Nutrition/ Hydration   Full liquid, Max-Total A   Max A  tolerance of current diet    ADL's   dependent overall  max A overall to reduce caregiver burden  alertness, cognition, family educaiton, initiation, orientation, endurance   Mobility   mechanical lift bed<>chair, +2 assist rolling in bed, poor awareness & cognition  max assist overall  family education, orientation, one step commands, activity tolerance   Communication   Total A  Max A  basic expression of wants/needs    Safety/Cognition/ Behavioral Observations  Total A  Max A  initation, attention    Pain   appears with discomfort to back area has order for Robaxin administered and appeared nore restful during shift  score < 2-3 per family      Skin   scattered cruisinfg , bo new areas , stable removed operative site healed no drainage  no skin issues  assess qs     Rehab Goals Patient on target to meet rehab goals: No Rehab Goals Revised: Pt's goals have been downgraded due to pt's medical condition. *See Care Plan and progress notes for long and short-term goals.     Barriers to Discharge  Current Status/Progress Possible Resolutions Date Resolved   Physician    Medical stability        maximize nutrition, diet changed to all liquid to maximize intake, alternative  means of nutrition      Nursing                  PT  Inaccessible home environment  14 steps to enter apartment              OT                  SLP Medical stability              SW                Discharge Planning/Teaching Needs:  Pt's family open to team's recommendation for d/c disposition plan.  SNF vs. hospice, depending on pt's progress.  Pt's family is at bedside regularly.   Team Discussion:  Pt with UTI now and antibiotics are being adjusted to cultures received.  Dr. Riley Kill talked with family  extensively about nutrition and hydration and the importance of family encouraging fluids, especially since pt's cognition prevents him from being able to manage this himself.  Pt is back on a full liquid diet per ST and has visual deficits.  PT is providing family with a lot of education.  OT is using music in their sessions and pt is responding to this.  RN and team are pleased palliative care is involved.    Revisions to Treatment Plan:  Pt's therapy frequency was changed to qd.    Continued Need for Acute Rehabilitation Level of Care: The patient requires daily medical management by a physician with specialized training in physical medicine and rehabilitation for the following conditions: Daily direction of a multidisciplinary physical rehabilitation program to ensure safe treatment while eliciting the highest outcome that is of practical value to the patient.: Yes Daily medical management of patient stability for increased activity during participation in an intensive rehabilitation regime.: Yes Daily analysis of laboratory values and/or radiology reports with any subsequent need for medication adjustment of medical intervention for : Neurological problems;Nutritional problems  Rolande Moe, [place] 07/16/2017, 10:06 AM

## 2017-07-16 NOTE — Progress Notes (Signed)
Speech Language Pathology Weekly Progress Note  Patient Details  Name: Randy Durham MRN: [id number] Date of Birth: December 30, 1933  Beginning of progress report period: July 08, 2017 End of progress report period: July 16, 2017   Short Term Goals: Week 2: SLP Short Term Goal 1 (Week 2): Patient will consume current diet with minimal overt s/s of aspiration with Min A verbal cues for use of swallowing compensatory strategies.  SLP Short Term Goal 1 - Progress (Week 2): Not met SLP Short Term Goal 2 (Week 2): Pt will demonstrate sustained attention to functional task for 2 minutes with Max multimodal cues. SLP Short Term Goal 2 - Progress (Week 2): Not met SLP Short Term Goal 3 (Week 2): Pt will initate basic familar ADL tasks in 30% of opportunties with Max multimodal cues. SLP Short Term Goal 3 - Progress (Week 2): Met SLP Short Term Goal 4 (Week 2): Pt will answer yes/no questions with 30% accuracy with Max A multimodal cues. SLP Short Term Goal 4 - Progress (Week 2): Not met SLP Short Term Goal 5 (Week 2): Pt will communicate basic wants and needs via multimodal communciation with Max A verbal cues. SLP Short Term Goal 5 - Progress (Week 2): Met    New Short Term Goals: Week 3: SLP Short Term Goal 1 (Week 3): Pt will communicate basic wants and needs via multimodal communciation with Mod A verbal cues. SLP Short Term Goal 2 (Week 3): Pt will answer yes/no questions in 10% of opportunities with Max A multimodal cues in regards to wants/needs. SLP Short Term Goal 3 (Week 3): Patient will keep his eyes open for ~2 minute intervals with Max A multimodal cues.  SLP Short Term Goal 4 (Week 3): Pt will initate self-feeding of liquids in 50% of opportunties with Max multimodal cues. SLP Short Term Goal 5 (Week 3): Patient will consume thin liquids without overt s/s of aspiration with Max A verbal cues.   Weekly Progress Updates: Patient has made minimal and inconsistent gains this  reporting period and has met 2 of 5 STG's this reporting period. Currently, patient's arousal and alertness continues to be a barrier for participation which impacts his ability to keep his eyes open, sustain attention and initiate functional tasks like self-feeding. Patient is also essentially nonverbal but will intermittently verbalize, however, it is nonpurposeful and unintelligible. Despite essentially being nonverbal, patient can express his basic needs like pain and need to void with use of gestures and facial expressions. Patient's diet has also been downgraded to a full liquid diet due to patient's decreased level of arousal and inability to consume sem-solids at this time. Family education is ongoing. Patient's plan of care has been changed to QD with plans to discharge to a SNF. Patient would benefit from continued skilled SLP intervention to maximize his functional communication, swallowing function, cognitive function and overall functional independence prior to discharge in order to reduce caregiver burden.     Intensity: Minumum of 1-2 x/day, 30 to 90 minutes Frequency: 3 to 5 out of 7 days Duration/Length of Stay: TBD due to SNF placement  Treatment/Interventions: Dysphagia/aspiration precaution training;Patient/family education;Functional tasks;Cognitive remediation/compensation;Speech/Language facilitation;Cueing hierarchy;Environmental controls;Therapeutic Activities      Torrin Frein, [place] 07/16/2017, 6:50 AM

## 2017-07-17 ENCOUNTER — Inpatient Hospital Stay (HOSPITAL_COMMUNITY): Payer: Medicare Other | Admitting: Physical Therapy

## 2017-07-17 ENCOUNTER — Inpatient Hospital Stay (HOSPITAL_COMMUNITY): Payer: Medicare Other | Admitting: Occupational Therapy

## 2017-07-17 ENCOUNTER — Inpatient Hospital Stay (HOSPITAL_COMMUNITY): Payer: Medicare Other | Admitting: Speech Pathology

## 2017-07-17 LAB — BASIC METABOLIC PANEL
Anion gap: 9 (ref 5–15)
BUN: 24 mg/dL — AB (ref 6–20)
CO2: 21 mmol/L — ABNORMAL LOW (ref 22–32)
CREATININE: 1.11 mg/dL (ref 0.61–1.24)
Calcium: 9.2 mg/dL (ref 8.9–10.3)
Chloride: 106 mmol/L (ref 101–111)
GFR calc Af Amer: >60 mL/min (ref >60)
GFR calc non Af Amer: >60 mL/min (ref >60)
Glucose, Bld: 99 mg/dL (ref 65–99)
Potassium: 4.3 mmol/L (ref 3.5–5.1)
SODIUM: 136 mmol/L (ref 135–145)

## 2017-07-17 MED ORDER — SODIUM CHLORIDE 0.45 % IV SOLN
INTRAVENOUS | Status: AC
  Administered 2017-07-17: 19:00:00 via INTRAVENOUS

## 2017-07-17 MED ORDER — AMOXICILLIN 250 MG/5ML PO SUSR
250.0000 mg | Freq: Three times a day (TID) | ORAL | Status: DC
  Administered 2017-07-18 – 2017-07-21 (×12): 250 mg via ORAL
  Filled 2017-07-17 (×18): qty 5

## 2017-07-17 NOTE — Progress Notes (Signed)
Physical Therapy Session Note  Patient Details  Name: Randy Durham MRN: [id number] Date of Birth: 29-Jul-1934  Today's Date: 07/17/2017 PT Individual Time: 4098-1191 PT Individual Time Calculation (min): 34 min   Short Term Goals: Week 3:  PT Short Term Goal 1 (Week 3): Pt will perform rolling in bed L<>R with hospital bed features and max assist +1.  PT Short Term Goal 2 (Week 3): Pt will transfer supine<>sitting EOB with max assist +1.  PT Short Term Goal 3 (Week 3): Pt will transfer bed<>chair with max assist +1.  Skilled Therapeutic Interventions/Progress Updates:  Pt received asleep in bed with son [name]) present for session. Pt uncomfortable and grabbing penis throughout session. Pt noted to be incontinent of urine and BM and having another incontinent BM during session. Pt required +2 assist for rolling L<>R and management of UE to allow therapist and rehab tech to perform peri hygiene, don new brief, ted hose, and gown total assist. Pt unable to follow commands to assist with tasks during session. Pt then transferred bed>TIS w/c via maxi sky total assist. Pt left in w/c with QRB donned & son & MD in room.   Therapy Documentation Precautions:  Precautions Precautions: Fall Restrictions Weight Bearing Restrictions: No   See Function Navigator for Current Functional Status.   Therapy/Group: Individual Therapy  Sandi Mariscal 07/17/2017, 9:34 AM

## 2017-07-17 NOTE — Progress Notes (Signed)
[place] [facility] PHYSICAL MEDICINE & REHABILITATION     PROGRESS NOTE    Subjective/Complaints: Had a reasonable night. More comfortable with treatment of UTI. Perhaps a little more alert. Having loose stool however.   ROS: limited  Objective: Vital Signs: Blood pressure 137/89, pulse 79, temperature 97.7 F (36.5 C), temperature source Oral, resp. rate 18, height  (1.6 m), weight 68.9 kg (152 lb), SpO2 98 %. No results found. No results for input(s): WBC, HGB, HCT, PLT in the last 72 hours.  Recent Labs  07/16/17 0555 07/17/17 0835  NA 138 136  K 4.1 4.3  CL 104 106  GLUCOSE 101* 99  BUN 41* 24*  CREATININE 1.35* 1.11  CALCIUM 9.1 9.2   CBG (last 3)  No results for input(s): GLUCAP in the last 72 hours.  Wt Readings from Last 3 Encounters:  07/16/17 68.9 kg (152 lb)  06/30/17 81.6 kg (180 lb)  03/25/17 82.1 kg (181 lb)    Physical Exam:  Constitutional: No distress. Vital signs reviewed HENT: Normocephalic.  Eyes: opens eyes intermittently to verbal cues Cardiovascular: RRR without murmur. No JVD    Respiratory: CTA Bilaterally without wheezes or rales. Normal effort           GI: Bowel sounds are normal. He exhibits no distension. Skin: Skin is warm and dry. Craniotomy site clean and dry Neurological:   Eyes open. Does not follow commands consistently. Garbled speech. Vision limited   Psych:  somnolent  Assessment/Plan: 1. Functional and cognitive deficits secondary to right temporal ICH which require 3+ hours per day of interdisciplinary therapy in a comprehensive inpatient rehab setting. Physiatrist is providing close team supervision and 24 hour management of active medical problems listed below. Physiatrist and rehab team continue to assess barriers to discharge/monitor patient progress toward functional and medical goals.  Function:  Bathing Bathing position   Position: Shower  Bathing parts   Body parts bathed by helper: Right arm, Left arm, Chest,  Abdomen, Front perineal area, Buttocks, Right upper leg, Left upper leg, Right lower leg, Left lower leg, Back  Bathing assist Assist Level: 2 helpers      Upper Body Dressing/Undressing Upper body dressing   What is the patient wearing?: Pull over shirt/dress       Pull over shirt/dress - Perfomed by helper: Thread/unthread left sleeve, Put head through opening, Pull shirt over trunk, Thread/unthread right sleeve        Upper body assist Assist Level: 2 helpers      Lower Body Dressing/Undressing Lower body dressing   What is the patient wearing?: Pants       Pants- Performed by helper: Thread/unthread right pants leg, Thread/unthread left pants leg, Pull pants up/down, Fasten/unfasten pants   Non-skid slipper socks- Performed by helper: Don/doff right sock, Don/doff left sock       Shoes - Performed by helper: Don/doff right shoe, Don/doff left shoe       TED Hose - Performed by helper: Don/doff right TED hose, Don/doff left TED hose  Lower body assist Assist for lower body dressing: 2 Helpers      Toileting Toileting Toileting activity did not occur: No continent bowel/bladder event   Toileting steps completed by helper: Adjust clothing prior to toileting, Performs perineal hygiene, Adjust clothing after toileting    Toileting assist Assist level: Two helpers   Transfers Chair/bed transfer   Chair/bed transfer method: Other Chair/bed transfer assist level: 2 helpers Chair/bed transfer assistive device: Mechanical lift Mechanical lift:  Maxisky   Locomotion Ambulation Ambulation activity did not occur: Safety/medical concerns (fatigue)   Max distance: 20 ft Assist level: 2 helpers   Education administrator activity did not occur: Safety/medical concerns     Assist Level: Dependent (Pt equals 0%)  Cognition Comprehension Comprehension assist level: Understands basic less than 25% of the time/ requires cueing >75% of the time  Expression Expression assist  level: Expresses basis less than 25% of the time/requires cueing >75% of the time.  Social Interaction Social Interaction assist level: Interacts appropriately less than 25% of the time. May be withdrawn or combative.  Problem Solving Problem solving assist level: Solves basic less than 25% of the time - needs direction nearly all the time or does not effectively solve problems and may need a restraint for safety  Memory Memory assist level: Recognizes or recalls less than 25% of the time/requires cueing greater than 75% of the time   Medical Problem List and Plan: 1. Decreased functional mobilitysecondary to right temporal ICH with IVH status post craniotomy for hematoma evacuation 06/24/2017 -appreciate Palliative care assistance.   -  Family is leaning toward hospice but hesitant still somewhat due to UTI. Watch over weekend to see if he responds to rx of uti and IVF's.  3. Pain Management/back pain: Discontinue hydrocodone due to somnolence.   Monitor mental status  Topamax qhs for headache  4. Mood:dc amantadine---change to ritalin beginning at noon today 5. Neuropsych: This patient is notcapable of making decisions on hisown behalf. -continue sleep chart -limiting neuro-sedating medications. off Seroquel  -stimulate during day with ritalin 6. Skin/Wound Care: Routine skin checks 7. Fluids/Electrolytes/Nutrition: encourage PO as possible    -full liquid diet. Family is pushing  -AKI, Bun/Cr improved with IVF  -change IVF to HS only 8.Seizure prophylaxis. Valproate 500 mg every 12 hours. EEG negative  -VPA level 55 on 8/29 9.CAD with cardiac catheterization 03/26/2015. Continue low-dose aspirin. 10.Hypertension/PAF. Lisinopril 5 mg daily, Lopressor 12.5 mg twice a day.   Norvasc 2.5 started on 9/1 to limit renal effects  BP controlled at present 11.Hyperlipidemia. Lipitor 12.Hypothyroidism. Synthroid. Follow up levels as outpt.  13. UTI  proteus: complete course of abx with amoxil 14. Loose stool: may be due to abx as well as liquid diet  -continue to monitor for now     LOS (Days) 17 A FACE TO FACE EVALUATION WAS PERFORMED  [name], MD 07/17/2017 10:15 AM

## 2017-07-17 NOTE — Progress Notes (Signed)
Speech Language Pathology Daily Session Note  Patient Details  Name: Randy Durham MRN: [id number] Date of Birth: 03-26-1934  Today's Date: 07/17/2017 SLP Individual Time: 1030-1100 SLP Individual Time Calculation (min): 30 min  Short Term Goals: Week 3: SLP Short Term Goal 1 (Week 3): Pt will communicate basic wants and needs via multimodal communciation with Mod A verbal cues. SLP Short Term Goal 2 (Week 3): Pt will answer yes/no questions in 10% of opportunities with Max A multimodal cues in regards to wants/needs. SLP Short Term Goal 3 (Week 3): Patient will keep his eyes open for ~2 minute intervals with Max A multimodal cues.  SLP Short Term Goal 4 (Week 3): Pt will initate self-feeding of liquids in 50% of opportunties with Max multimodal cues. SLP Short Term Goal 5 (Week 3): Patient will consume thin liquids without overt s/s of aspiration with Max A verbal cues.   Skilled Therapeutic Interventions: Skilled treatment session focused on cognition and dysphagia goals. SLP entered room, pt was awake and his son was providing Total assistance to get pt's hands out of his brief. SLP recommended placing objects in pt's hands - such as cup, water bottle. This worked for brief periods of time. Pt able to consume 12 oz Ensure via straw without overt s/s of aspiration. Pt with no other interaction with son or SLP. Pt left upright in bed, bed alarm on and son present. Continue per current plan of care.      Function:  Eating Eating   Modified Consistency Diet: No Eating Assist Level: Helper feeds patient           Cognition Comprehension Comprehension assist level: Understands basic less than 25% of the time/ requires cueing >75% of the time  Expression   Expression assist level: Expresses basis less than 25% of the time/requires cueing >75% of the time.  Social Interaction Social Interaction assist level: Interacts appropriately less than 25% of the time. May be withdrawn or  combative.  Problem Solving Problem solving assist level: Solves basic less than 25% of the time - needs direction nearly all the time or does not effectively solve problems and may need a restraint for safety  Memory Memory assist level: Recognizes or recalls less than 25% of the time/requires cueing greater than 75% of the time    Pain    Therapy/Group: Individual Therapy  Timiko Offutt 07/17/2017, 12:28 PM

## 2017-07-17 NOTE — Progress Notes (Signed)
Occupational Therapy Session Note  Patient Details  Name: Randy Durham MRN: [id number] Date of Birth: May 24, 1934  Today's Date: 07/17/2017 OT Individual Time: 7829-5621 OT Individual Time Calculation (min): 32 min    Skilled Therapeutic Interventions/Progress Updates:    Tx focus on sitting balance, endurance, and alertness during meaningful tasks.   Pt greeted supine in bed with son and spouse present. Supine<sit with Total A. Pt exhibiting Rt lean initially (requiring Mod A for balance). Lean improved with manual cuing, and pt able to maintain static sitting balance without UE support while dancing with OT. With mod cues, pt able to keep eyes open for 50% of tx! Pt smiling and actively assisting with moving upper limbs in beat to favorite Elvis music. Close supervision from 2nd helper for maintaining sitting balance unsupported. At end of tx pt was transitioned back to supine with 2 helpers. He was repositioned for comfort and left with all needs within reach and 4 bedrails up at session exit.   Therapy Documentation Precautions:  Precautions Precautions: Fall Restrictions Weight Bearing Restrictions: No Pain: No c/o pain during tx    ADL: ADL ADL Comments: refer to functional navigator     See Function Navigator for Current Functional Status.   Therapy/Group: Individual Therapy  Raelyn Racette A Izyan Ezzell 07/17/2017, 12:50 PM

## 2017-07-17 NOTE — Progress Notes (Signed)
Resting at interval during shift, very emotional with concerns of being a patient in this time of rehab  environment, ,reassurranc provided throughout shift, prn and Robaxin administered, , Feeling better

## 2017-07-18 ENCOUNTER — Inpatient Hospital Stay (HOSPITAL_COMMUNITY): Payer: Medicare Other | Admitting: Occupational Therapy

## 2017-07-18 NOTE — Progress Notes (Signed)
Occupational Therapy Session Note  Patient Details  Name: Randy Durham MRN: [id number] Date of Birth: 1934/09/26  Today's Date: 07/18/2017 OT Individual Time: 4098-1191 OT Individual Time Calculation (min): 33 min    Short Term Goals: Week 2:  OT Short Term Goal 1 (Week 2): Pt will initiate once self care task within 5 minutes. OT Short Term Goal 2 (Week 2): Pt will perform UB bathing with max A. OT Short Term Goal 3 (Week 2): Pt will sit on EOB for 10 minutes of static sitting with mod A for balance.  Skilled Therapeutic Interventions/Progress Updates:    Tx focus on sitting balance, endurance, and alertness during meaningful activity.   Pt greeted supine in bed with family present. 2 helpers for rolling R>L to don clean brief prior to donning hospital gown. Total A for supine<sit. Pt engaging in dancing with OT to meaningful upbeat music, PROM/AAROM with bilateral UEs 80% of time (unilateral support required on bed to maintain balance for 20% of time). Pt able to maintain unsupported sitting balance for 10 minute windows with min guard during task. Pt muttering/uttering sounds and smiling. Pts eyes opened 75% of time (with glasses on). Still required mod-max cuing for this. Educated family on using meaningful music during functional activities to enhance participation and psychosocial welbeing. At end of session pt was returned to supine and left with family at bedside.    Therapy Documentation Precautions:  Precautions Precautions: Fall Restrictions Weight Bearing Restrictions: No General:   Vital Signs: Therapy Vitals Pulse Rate: (!) 105 (RN notified) Resp: 20 BP: 104/64 Patient Position (if appropriate): Lying Oxygen Therapy SpO2: 98 % O2 Device: Not Delivered Pain: Pain Assessment Faces Pain Scale: Hurts a little bit PAINAD (Pain Assessment in Advanced Dementia) Facial Expression: facial grimacing ADL: ADL ADL Comments: refer to functional navigator     See  Function Navigator for Current Functional Status.   Therapy/Group: Individual Therapy  Kristy Schomburg A Madell Heino 07/18/2017, 4:00 PM

## 2017-07-18 NOTE — Progress Notes (Signed)
[facility] PHYSICAL MEDICINE & REHABILITATION     PROGRESS NOTE    Subjective/Complaints:  No issues overnite, spoke to son who indicated that pt had a lucid episode yesterday evening and was answering some Y/N ?s  ROS: limited by cognition  Objective: Vital Signs: Blood pressure 140/80, pulse 88, temperature 98 F (36.7 C), temperature source Oral, resp. rate 18, height  (1.6 m), weight 74.8 kg (165 lb), SpO2 98 %. No results found. No results for input(s): WBC, HGB, HCT, PLT in the last 72 hours.  Recent Labs  07/16/17 0555 07/17/17 0835  NA 138 136  K 4.1 4.3  CL 104 106  GLUCOSE 101* 99  BUN 41* 24*  CREATININE 1.35* 1.11  CALCIUM 9.1 9.2   CBG (last 3)  No results for input(s): GLUCAP in the last 72 hours.  Wt Readings from Last 3 Encounters:  07/18/17 74.8 kg (165 lb)  06/30/17 81.6 kg (180 lb)  03/25/17 82.1 kg (181 lb)    Physical Exam:  Constitutional: No distress. Vital signs reviewed HENT: Normocephalic.  Eyes: opens eyes intermittently to verbal cues Cardiovascular: RRR without murmur. No JVD    Respiratory: CTA Bilaterally without wheezes or rales. Normal effort           GI: Bowel sounds are normal. He exhibits no distension. Skin: Skin is warm and dry. Craniotomy site clean and dry Neurological:   Eyes open. Does not follow commands . Garbled speech. Moves all 4 ext antigravity  ( active assisted ) Psych:  somnolent  Assessment/Plan: 1. Functional and cognitive deficits secondary to right temporal ICH which require 3+ hours per day of interdisciplinary therapy in a comprehensive inpatient rehab setting. Physiatrist is providing close team supervision and 24 hour management of active medical problems listed below. Physiatrist and rehab team continue to assess barriers to discharge/monitor patient progress toward functional and medical goals.  Function:  Bathing Bathing position   Position: Shower  Bathing parts   Body parts bathed by  helper: Right arm, Left arm, Chest, Abdomen, Front perineal area, Buttocks, Right upper leg, Left upper leg, Right lower leg, Left lower leg, Back  Bathing assist Assist Level: 2 helpers      Upper Body Dressing/Undressing Upper body dressing   What is the patient wearing?: Pull over shirt/dress       Pull over shirt/dress - Perfomed by helper: Thread/unthread left sleeve, Put head through opening, Pull shirt over trunk, Thread/unthread right sleeve        Upper body assist Assist Level: 2 helpers      Lower Body Dressing/Undressing Lower body dressing   What is the patient wearing?: Pants       Pants- Performed by helper: Thread/unthread right pants leg, Thread/unthread left pants leg, Pull pants up/down, Fasten/unfasten pants   Non-skid slipper socks- Performed by helper: Don/doff right sock, Don/doff left sock       Shoes - Performed by helper: Don/doff right shoe, Don/doff left shoe       TED Hose - Performed by helper: Don/doff right TED hose, Don/doff left TED hose  Lower body assist Assist for lower body dressing: 2 Helpers      Toileting Toileting Toileting activity did not occur: No continent bowel/bladder event   Toileting steps completed by helper: Adjust clothing prior to toileting, Performs perineal hygiene, Adjust clothing after toileting    Toileting assist Assist level: Two helpers   Transfers Chair/bed transfer   Chair/bed transfer method: Other Chair/bed transfer assist  level: 2 helpers Chair/bed transfer assistive device: Systems developer lift: Tour manager activity did not occur: Safety/medical concerns (fatigue)   Max distance: 20 ft Assist level: 2 helpers   Education administrator activity did not occur: Safety/medical concerns     Assist Level: Dependent (Pt equals 0%)  Cognition Comprehension Comprehension assist level: Understands basic less than 25% of the time/ requires cueing >75% of the  time  Expression Expression assist level: Expresses basis less than 25% of the time/requires cueing >75% of the time.  Social Interaction Social Interaction assist level: Interacts appropriately less than 25% of the time. May be withdrawn or combative.  Problem Solving Problem solving assist level: Solves basic less than 25% of the time - needs direction nearly all the time or does not effectively solve problems and may need a restraint for safety  Memory Memory assist level: Recognizes or recalls less than 25% of the time/requires cueing greater than 75% of the time   Medical Problem List and Plan: 1. Decreased functional mobilitysecondary to right temporal ICH with IVH status post craniotomy for hematoma evacuation 06/24/2017 -appreciate Palliative care assistance.   -  Family is leaning toward hospice but hesitant still somewhat due to UTI.  afeb urine micro reviewed abx approp 3. Pain Management/back pain: Discontinue hydrocodone due to somnolence.   Monitor mental status  Topamax qhs for headache  4. Mood:dc amantadine---change to ritalin beginning at noon today 5. Neuropsych: This patient is notcapable of making decisions on hisown behalf. -continue sleep chart -limiting neuro-sedating medications.  -stimulate during day with ritalin 6. Skin/Wound Care: Routine skin checks 7. Fluids/Electrolytes/Nutrition: encourage PO as possible    -full liquid diet. Family is pushing  -AKI, Bun/Cr improved with IVF  -change IVF to HS only 8.Seizure prophylaxis. Valproate 500 mg every 12 hours. EEG negative  -VPA level 55 on 8/29 9.CAD with cardiac catheterization 03/26/2015. Continue low-dose aspirin. 10.Hypertension/PAF. Lisinopril 5 mg daily, Lopressor 12.5 mg twice a day.   Norvasc 2.5 started on 9/1 to limit renal effects  Mild sys elevation otherwise ok Vitals:   07/17/17 2151 07/18/17 0358  BP: 139/83 140/80  Pulse: 97 88  Resp:  18  Temp:   98 F (36.7 C)  SpO2:  98%   11.Hyperlipidemia. Lipitor 12.Hypothyroidism. Synthroid. Follow up levels as outpt.  13. UTI proteus: complete course of abx with amoxil 14. Loose stool: may be due to abx as well as liquid diet  -continue to monitor for now     LOS (Days) 18 A FACE TO FACE EVALUATION WAS PERFORMED  Erick Colace, MD 07/18/2017 9:31 AM

## 2017-07-19 ENCOUNTER — Inpatient Hospital Stay (HOSPITAL_COMMUNITY): Payer: Medicare Other | Admitting: Occupational Therapy

## 2017-07-19 ENCOUNTER — Inpatient Hospital Stay (HOSPITAL_COMMUNITY): Payer: Medicare Other

## 2017-07-19 NOTE — Progress Notes (Addendum)
Occupational Therapy Weekly Progress Note  Patient Details  Name: Randy Durham MRN: [id number] Date of Birth: Feb 17, 1934  Beginning of progress report period: 07/09/2017 End of progress report period: 07/19/2017  Today's Date: 07/19/2017 OT Individual Time: 1415-1450 OT Individual Time Calculation (min): 35 min    Patient has met 1 of 3 short term goals.    Pt has made minimal progress during this report period. Due to significant lethargy and decreased alertness (in addition to having CVA on unit and recent UTI), pt still requires Max-Total Ax2 for all self care tasks and functional transfers. Pts supportive family has been present during most therapy sessions and have been receptive to education and caregiver training.   Patient continues to demonstrate the following deficits: muscle weakness, decreased cardiorespiratoy endurance, impaired timing and sequencing, abnormal tone, unbalanced muscle activation, decreased coordination and decreased motor planning, decreased visual acuity, decreased midline orientation, decreased attention to right and decreased motor planning, decreased initiation, decreased attention, decreased awareness, decreased problem solving, decreased safety awareness, decreased memory and delayed processing and decreased sitting balance, decreased standing balance, decreased postural control, hemiplegia and decreased balance strategies and therefore will continue to benefit from skilled OT intervention to enhance overall performance with BADL.  Patient progressing toward long term goals..  Continue plan of care.  OT Short Term Goals Week 2:  OT Short Term Goal 1 (Week 2): Pt will initiate once self care task within 5 minutes. OT Short Term Goal 1 - Progress (Week 2): Not met OT Short Term Goal 2 (Week 2): Pt will perform UB bathing with max A. OT Short Term Goal 2 - Progress (Week 2): Not met OT Short Term Goal 3 (Week 2): Pt will sit on EOB for 10 minutes of static  sitting with mod A for balance. OT Short Term Goal 3 - Progress (Week 2): Met    Skilled Therapeutic Interventions/Progress Updates:    Tx focus on alertness and ADL retraining.   Pt greeted supine in bed, eyes closed. With sternal rub/cold wash cloth to face/all lights turned on pt did not open eyes. Tried to use Elvis music to increase alertness, and pt removed wash cloth from face x2 afterwards. Placed comb in his hand with pt brushing below nose, HOH for brushing hair on Lt and Rt sides with L UE (pt resistant to gentle guidance of R UE movements). HOH for donning glasses and squeezing lotion bottle to apply lotion to UEs. Sensory feedback/gentle UE ROM with OT rubbing in lotion. Family then arrived, assisted OT with repositioning pt on Lt side for pressure relief with heels floating. They are still unsure whether to d/c pt to SNF or hospice. Pt left with family at time of departure.  .   Therapy Documentation Precautions:  Precautions Precautions: Fall Restrictions Weight Bearing Restrictions: (P) No General:   Vital Signs: Therapy Vitals Temp: 98.1 F (36.7 C) Temp Source: Oral Pulse Rate: 93 Resp: 17 BP: 126/77 Patient Position (if appropriate): Lying Oxygen Therapy SpO2: 95 % O2 Device: Not Delivered Pain: Pain Assessment Faces Pain Scale: Hurts a little bit ADL: ADL ADL Comments: refer to functional navigator     See Function Navigator for Current Functional Status.   Therapy/Group: Individual Therapy  Ellwood Steidle A Talha Iser 07/19/2017, 4:19 PM

## 2017-07-19 NOTE — Progress Notes (Signed)
[facility] PHYSICAL MEDICINE & REHABILITATION     PROGRESS NOTE    Subjective/Complaints:  Incont bowel and bladder  ROS: limited by cognition  Objective: Vital Signs: Blood pressure (!) 160/91, pulse 91, temperature 97.6 F (36.4 C), temperature source Oral, resp. rate 18, height  (1.6 m), weight 72.6 kg (160 lb), SpO2 97 %. No results found. No results for input(s): WBC, HGB, HCT, PLT in the last 72 hours.  Recent Labs  07/17/17 0835  NA 136  K 4.3  CL 106  GLUCOSE 99  BUN 24*  CREATININE 1.11  CALCIUM 9.2   CBG (last 3)  No results for input(s): GLUCAP in the last 72 hours.  Wt Readings from Last 3 Encounters:  07/19/17 72.6 kg (160 lb)  06/30/17 81.6 kg (180 lb)  03/25/17 82.1 kg (181 lb)    Physical Exam:  Constitutional: No distress. Vital signs reviewed HENT: Normocephalic.  Eyes: opens eyes intermittently to verbal cues Cardiovascular: RRR without murmur. No JVD    Respiratory: CTA Bilaterally without wheezes or rales. Normal effort           GI: Bowel sounds are normal. He exhibits no distension. Skin: Skin is warm and dry. Craniotomy site clean and dry Neurological:   Eyes open. Does not follow commands . Garbled speech. Moves all 4 ext antigravity  ( active assisted ) Psych:  somnolent  Assessment/Plan: 1. Functional and cognitive deficits secondary to right temporal ICH which require 3+ hours per day of interdisciplinary therapy in a comprehensive inpatient rehab setting. Physiatrist is providing close team supervision and 24 hour management of active medical problems listed below. Physiatrist and rehab team continue to assess barriers to discharge/monitor patient progress toward functional and medical goals.  Function:  Bathing Bathing position   Position: Shower  Bathing parts   Body parts bathed by helper: Right arm, Left arm, Chest, Abdomen, Front perineal area, Buttocks, Right upper leg, Left upper leg, Right lower leg, Left lower  leg, Back  Bathing assist Assist Level: 2 helpers      Upper Body Dressing/Undressing Upper body dressing   What is the patient wearing?: Pull over shirt/dress       Pull over shirt/dress - Perfomed by helper: Thread/unthread left sleeve, Put head through opening, Pull shirt over trunk, Thread/unthread right sleeve        Upper body assist Assist Level: 2 helpers      Lower Body Dressing/Undressing Lower body dressing   What is the patient wearing?: Pants       Pants- Performed by helper: Thread/unthread right pants leg, Thread/unthread left pants leg, Pull pants up/down, Fasten/unfasten pants   Non-skid slipper socks- Performed by helper: Don/doff right sock, Don/doff left sock       Shoes - Performed by helper: Don/doff right shoe, Don/doff left shoe       TED Hose - Performed by helper: Don/doff right TED hose, Don/doff left TED hose  Lower body assist Assist for lower body dressing: 2 Helpers      Toileting Toileting Toileting activity did not occur: No continent bowel/bladder event   Toileting steps completed by helper: Adjust clothing prior to toileting, Performs perineal hygiene, Adjust clothing after toileting    Toileting assist Assist level: Two helpers   Transfers Chair/bed transfer   Chair/bed transfer method: Other Chair/bed transfer assist level: 2 helpers Chair/bed transfer assistive device: Systems developer lift: [place]   Locomotion Ambulation Ambulation activity did not occur: Safety/medical concerns (fatigue)  Max distance: 20 ft Assist level: 2 helpers   Education administrator activity did not occur: Safety/medical concerns     Assist Level: Dependent (Pt equals 0%)  Cognition Comprehension Comprehension assist level: Understands basic less than 25% of the time/ requires cueing >75% of the time  Expression Expression assist level: Expresses basis less than 25% of the time/requires cueing >75% of the time.  Social  Interaction Social Interaction assist level: Interacts appropriately less than 25% of the time. May be withdrawn or combative.  Problem Solving Problem solving assist level: Solves basic less than 25% of the time - needs direction nearly all the time or does not effectively solve problems and may need a restraint for safety  Memory Memory assist level: Recognizes or recalls less than 25% of the time/requires cueing greater than 75% of the time   Medical Problem List and Plan: 1. Decreased functional mobilitysecondary to right temporal ICH with IVH status post craniotomy for hematoma evacuation 06/24/2017 -appreciate Palliative care assistance.   -  Family is leaning toward hospice but hesitant still somewhat due to UTI.  afeb urine micro reviewed abx approp 3. Pain Management/back pain: Discontinue hydrocodone due to somnolence.   Monitor mental status  Topamax qhs for headache  4. Mood:dc amantadine---change to ritalin beginning at noon today 5. Neuropsych: This patient is notcapable of making decisions on hisown behalf. -continue sleep chart -limiting neuro-sedating medications.  -stimulate during day with ritalin 6. Skin/Wound Care: Routine skin checks 7. Fluids/Electrolytes/Nutrition: encourage PO as possible    -full liquid diet. Family is pushing  -AKI, Bun/Cr improved with IVF-bmet in am  -change IVF to HS only 8.Seizure prophylaxis. Valproate 500 mg every 12 hours. EEG negative  -VPA level 55 on 8/29 9.CAD with cardiac catheterization 03/26/2015. Continue low-dose aspirin. 10.Hypertension/PAF. Lisinopril 5 mg daily, Lopressor 12.5 mg twice a day.   Norvasc 2.5 started on 9/1 to limit renal effects  Mild sys elevation otherwise ok Vitals:   07/18/17 2109 07/19/17 0725  BP: 134/79 (!) 160/91  Pulse: 94 91  Resp:  18  Temp:  97.6 F (36.4 C)  SpO2:  97%   11.Hyperlipidemia. Lipitor 12.Hypothyroidism. Synthroid. Follow up levels  as outpt.  13. UTI proteus: complete course of abx with amoxil 14. Loose stool: incont due to cognition   LOS (Days) 19 A FACE TO FACE EVALUATION WAS PERFORMED  Erick Colace, MD 07/19/2017 9:20 AM

## 2017-07-19 NOTE — Progress Notes (Signed)
Physical Therapy Session Note  Patient Details  Name: Randy Durham MRN: [id number] Date of Birth: 30-Jul-1934  Today's Date: 07/19/2017 PT Individual Time: 4098-1191 PT Individual Time Calculation (min): 30 min   Short Term Goals: Week 3:  PT Short Term Goal 1 (Week 3): Pt will perform rolling in bed L<>R with hospital bed features and max assist +1.  PT Short Term Goal 2 (Week 3): Pt will transfer supine<>sitting EOB with max assist +1.  PT Short Term Goal 3 (Week 3): Pt will transfer bed<>chair with max assist +1.  Skilled Therapeutic Interventions/Progress Updates:   Pt supine in bed, awake after just finishing lunch. Pt rolled to each side x 1 in order to don brief, with max assist. PT performed manual stretches to help improve ROM: hamstring 90/90 stretch 2 x 30 sec B, calf stretch 2 x 30 sec B, knee extension stretch 2 x 30 sec B. Pt rolled onto L side with max assist, PT performed R hip flexor stretch in sidelying 2 x 30 sec. Pt rolled onto R side with max assist, PT performed L hip flexor stretch 2 x 30 sec. Attempted to perform supine<>sitting EOB but patient resistant this session, lethargic and sleepy. Pt left in R sidelying due to pt preference, pillows used to promoted better positioning, pt falling alseep. [name] and son returned to room at end of session.   Therapy Documentation Precautions:  Precautions Precautions: Fall Restrictions Weight Bearing Restrictions: No   See Function Navigator for Current Functional Status.   Therapy/Group: Individual Therapy  Cresenciano Genre, PT, DPT 07/19/2017, 1:42 PM

## 2017-07-20 ENCOUNTER — Inpatient Hospital Stay (HOSPITAL_COMMUNITY): Payer: Medicare Other | Admitting: Physical Therapy

## 2017-07-20 ENCOUNTER — Inpatient Hospital Stay (HOSPITAL_COMMUNITY): Payer: Medicare Other | Admitting: Speech Pathology

## 2017-07-20 ENCOUNTER — Inpatient Hospital Stay (HOSPITAL_COMMUNITY): Payer: Medicare Other | Admitting: Occupational Therapy

## 2017-07-20 LAB — BASIC METABOLIC PANEL
Anion gap: 9 (ref 5–15)
BUN: 32 mg/dL — AB (ref 6–20)
CALCIUM: 9.5 mg/dL (ref 8.9–10.3)
CO2: 23 mmol/L (ref 22–32)
CREATININE: 1.02 mg/dL (ref 0.61–1.24)
Chloride: 106 mmol/L (ref 101–111)
GFR calc non Af Amer: >60 mL/min (ref >60)
Glucose, Bld: 105 mg/dL — ABNORMAL HIGH (ref 65–99)
Potassium: 4.1 mmol/L (ref 3.5–5.1)
SODIUM: 138 mmol/L (ref 135–145)

## 2017-07-20 MED ORDER — LOPERAMIDE HCL 2 MG PO CAPS: 2.0000 mg | ORAL_CAPSULE | ORAL | Status: DC | PRN

## 2017-07-20 MED ORDER — SACCHAROMYCES BOULARDII 250 MG PO CAPS
250.0000 mg | ORAL_CAPSULE | Freq: Two times a day (BID) | ORAL | Status: DC
  Administered 2017-07-20 – 2017-07-22 (×5): 250 mg via ORAL
  Filled 2017-07-20 (×5): qty 1

## 2017-07-20 NOTE — Progress Notes (Signed)
Speech Language Pathology Daily Session Note  Patient Details  Name: Randy Durham MRN: [id number] Date of Birth: [birth year]  Today's Date: 07/20/2017 SLP Individual Time: 1445-1500 SLP Individual Time Calculation (min): 15 min  Short Term Goals: Week 3: SLP Short Term Goal 1 (Week 3): Pt will communicate basic wants and needs via multimodal communciation with Mod A verbal cues. SLP Short Term Goal 2 (Week 3): Pt will answer yes/no questions in 10% of opportunities with Max A multimodal cues in regards to wants/needs. SLP Short Term Goal 3 (Week 3): Patient will keep his eyes open for ~2 minute intervals with Max A multimodal cues.  SLP Short Term Goal 4 (Week 3): Pt will initate self-feeding of liquids in 50% of opportunties with Max multimodal cues. SLP Short Term Goal 5 (Week 3): Patient will consume thin liquids without overt s/s of aspiration with Max A verbal cues.   Skilled Therapeutic Interventions: Skilled treatment session focused on cognition goals. Pt was alert when SLP present but was fixated on his genitals. Effort made to reposition pt and to engage pt in other functional tasks such as holding cup. Pt not stimulable for redirection. All questions answered to pt's son's satisfaction. Pt left in bed, in lowest position and son present. Continue per current plan of care.      Function:  Eating Eating   Modified Consistency Diet: No Eating Assist Level: Helper feeds patient           Cognition Comprehension Comprehension assist level: Understands basic less than 25% of the time/ requires cueing >75% of the time  Expression   Expression assist level: Expresses basis less than 25% of the time/requires cueing >75% of the time.  Social Interaction Social Interaction assist level: Interacts appropriately less than 25% of the time. May be withdrawn or combative.  Problem Solving Problem solving assist level: Solves basic less than 25% of the time - needs direction nearly all  the time or does not effectively solve problems and may need a restraint for safety  Memory Memory assist level: Recognizes or recalls less than 25% of the time/requires cueing greater than 75% of the time    Pain    Therapy/Group: Individual Therapy  Raelie Lohr 07/20/2017, 3:55 PM

## 2017-07-20 NOTE — Plan of Care (Signed)
Problem: RH BOWEL ELIMINATION Goal: RH STG MANAGE BOWEL WITH ASSISTANCE STG Manage Bowel with max Assistance.    Outcome: Not Progressing Pt incontinent of stool with total dependence for pericare.

## 2017-07-20 NOTE — Progress Notes (Signed)
Social Work Patient ID: Randy Durham, male   DOB: August 20, 1934, 83 y.o.   MRN: [id number]   CSW met with pt's wife and son to discuss their plan for pt to go to SNF.  CSW explained process for SNF search and assured them that CSW would keep them posted on bed offers.  CSW has faxed FL2 and await responses.  CSW will facilitate transfer to SNF once bed is offered and accepted by family.

## 2017-07-20 NOTE — NC FL2 (Addendum)
[place] MEDICAID FL2 LEVEL OF CARE SCREENING TOOL     IDENTIFICATION  Patient Name: Randy Durham Birthdate: April 25, 1934 Sex: male Admission Date (Current Location): 06/30/2017  [facility] and [place] Number:  Producer, television/film/video and Address:  [facility] [place], [address] [place], [place] [address]      Provider Number: [id number]  Attending Physician Name and Address:  [name], MD  Relative Name and Phone Number:       Current Level of Care: Other (Comment) (Acute Inpatient Rehab) Recommended Level of Care: Skilled Nursing Facility Prior Approval Number:    Date Approved/Denied:   PASRR Number: [id number]  Discharge Plan: SNF    Current Diagnoses: Patient Active Problem List   Diagnosis Date Noted  . Palliative care by specialist   . Vascular headache   . Lethargy   . AKI (acute kidney injury) (HCC)   . Benign essential HTN   . Somnolence   . Intracerebral hemorrhage 06/24/2017  . Hemorrhage of right temporal lobe (HCC) 06/24/2017  . Intraventricular hemorrhage (HCC) 06/24/2017  . Cytotoxic brain edema (HCC) 06/24/2017  . Paroxysmal atrial fibrillation (HCC) 03/30/2015  . Bradycardia 03/27/2015  . NSVT (nonsustained ventricular tachycardia) (HCC) 03/27/2015  . CAD- occluded distal LAD, 75-80% CFX- medical Rx 03/27/2015  . NSTEMI 03/25/15 03/25/2015  . Chronic kidney disease (CKD), stage III (moderate)   . Obesity (BMI 30-39.9)   . Spinal stenosis   . Hypertension   . Hyperlipidemia     Orientation RESPIRATION BLADDER Height & Weight     Self  Normal Incontinent Weight: 73 kg (161 lb) Height:   (160 cm)  BEHAVIORAL SYMPTOMS/MOOD NEUROLOGICAL BOWEL NUTRITION STATUS     (seizure prophylaxis - EEG clear) Incontinent Diet (full liquid diet with nutritional supplements)  AMBULATORY STATUS COMMUNICATION OF NEEDS Skin   Total Care Non-Verbally (does not initiate communication; family reads his non-verbal cues) Bruising  (old bruising, no new issues; surgical site healed and staple removed)                       Personal Care Assistance Level of Assistance  Bathing, Feeding, Dressing Bathing Assistance: Maximum assistance Feeding assistance: Maximum assistance Dressing Assistance: Maximum assistance     Functional Limitations Info  Sight, Speech Sight Info: Impaired   Speech Info: Impaired    SPECIAL CARE FACTORS FREQUENCY  PT (By licensed PT), OT (By licensed OT), Bowel and bladder program, Speech therapy     PT Frequency: 5x/week OT Frequency: 5x/week Bowel and Bladder Program Frequency: monitor for continence; trial of timed toileting   Speech Therapy Frequency: 5x/week      Contractures Contractures Info: Not present    Additional Factors Info  Code Status, Allergies (DNR; no known allergies)   IV fluids at 50 cc/hour from 7pm - 7am; half-normal saline Code Status Info: DNR Allergies Info: no known allergies           Current Medications (07/20/2017):  This is the current hospital active medication list Current Facility-Administered Medications  Medication Dose Route Frequency Provider Last Rate Last Dose  . acetaminophen (TYLENOL) tablet 650 mg  650 mg Oral Q4H PRN [name], MD   650 mg at 07/02/17 1006   Or  . acetaminophen (TYLENOL) solution 650 mg  650 mg Oral Q4H PRN [name], MD   650 mg at 07/02/17 0226   Or  . acetaminophen (TYLENOL) suppository 650 mg  650 mg  Rectal Q4H PRN [name], MD      . acetaminophen (TYLENOL) tablet 650 mg  650 mg Oral Q6H Charlton Amor, PA-C   650 mg at 07/20/17 1257  . amLODipine (NORVASC) tablet 2.5 mg  2.5 mg Oral Daily Marcello Fennel, MD   2.5 mg at 07/20/17 0853  . amoxicillin (AMOXIL) 250 MG/5ML suspension 250 mg  250 mg Oral Q8H [name], MD   250 mg at 07/20/17 [phone]  . apixaban (ELIQUIS) tablet 5 mg  5 mg Oral BID [name], MD   5 mg at 07/20/17 [phone]  . atorvastatin (LIPITOR)  tablet 80 mg  80 mg Oral q1800 Charlton Amor, PA-C   80 mg at 07/19/17 1736  . feeding supplement (ENSURE ENLIVE) (ENSURE ENLIVE) liquid 237 mL  237 mL Oral TID BM AngiulliMcarthur Rossetti, PA-C   237 mL at 07/20/17 1305  . feeding supplement (PRO-STAT SUGAR FREE 64) liquid 30 mL  30 mL Oral BID [name], MD   30 mL at 07/20/17 [phone]  . levothyroxine (SYNTHROID, LEVOTHROID) tablet 112 mcg  112 mcg Per Tube QAC breakfast Charlton Amor, PA-C   112 mcg at 07/20/17 8119  . lisinopril (PRINIVIL,ZESTRIL) tablet 5 mg  5 mg Oral QHS [name], MD   5 mg at 07/19/17 2122  . loperamide (IMODIUM) capsule 2 mg  2 mg Oral PRN [name], MD      . MEDLINE mouth rinse  15 mL Mouth Rinse BID [name], MD   15 mL at 07/20/17 0854  . methocarbamol (ROBAXIN) tablet 500 mg  500 mg Oral Q6H PRN Charlton Amor, PA-C   500 mg at 07/19/17 2124  . methylphenidate (RITALIN) tablet 10 mg  10 mg Oral BID WC [name], MD   10 mg at 07/20/17 1258  . metoprolol tartrate (LOPRESSOR) tablet 12.5 mg  12.5 mg Oral BID [name], MD   12.5 mg at 07/20/17 0853  . nitroGLYCERIN (NITROSTAT) SL tablet 0.4 mg  0.4 mg Sublingual Q5 Min x 3 PRN Angiulli, Mcarthur Rossetti, PA-C      . ondansetron [facility]) tablet 4 mg  4 mg Oral Q6H PRN Charlton Amor, PA-C   4 mg at 07/15/17 1478   Or  . ondansetron (ZOFRAN) injection 4 mg  4 mg Intravenous Q6H PRN Angiulli, Mcarthur Rossetti, PA-C      . saccharomyces boulardii (FLORASTOR) capsule 250 mg  250 mg Oral BID [name], MD   250 mg at 07/20/17 1257  . sorbitol 70 % solution 30 mL  30 mL Oral Daily PRN Charlton Amor, PA-C   30 mL at 07/11/17 2222  . topiramate (TOPAMAX) tablet 25 mg  25 mg Oral Daily [name], MD   25 mg at 07/19/17 2122  . Valproate Sodium (DEPAKENE) solution 500 mg  500 mg Oral BID [name], MD   500 mg at 07/20/17 2956     Discharge Medications: Please see discharge summary for a list of discharge  medications.  Relevant Imaging Results:  Relevant Lab Results:   Additional Information SS#: [id number]  Carrie Usery, [place], LCSW

## 2017-07-20 NOTE — Progress Notes (Signed)
Physical Therapy Session Note  Patient Details  Name: Randy Durham MRN: [id number] Date of Birth: [birth year]  Today's Date: 07/20/2017 PT Individual Time: 0815-0830 PT Individual Time Calculation (min): 15 min   Short Term Goals: Week 3:  PT Short Term Goal 1 (Week 3): Pt will perform rolling in bed L<>R with hospital bed features and max assist +1.  PT Short Term Goal 2 (Week 3): Pt will transfer supine<>sitting EOB with max assist +1.  PT Short Term Goal 3 (Week 3): Pt will transfer bed<>chair with max assist +1.  Skilled Therapeutic Interventions/Progress Updates:  Pt received asleep in bed with son [name]) and wife [name]) present in room. MD in room assessing pt & talking with family; returned after MD finished. Therapist wiped pt's face with cold cloth and performed sternal rub - pt able to grimace but did not open eyes. Pt did not initiate or engage wiping face with cloth. Educated pt's family on his minimal to no participation in purposeful activities during past therapy sessions. Pt's family report comfort with performing BLE PROM stretches to pt's legs. At end of session pt left in bed with 4 rails up, family present to supervise, and bed alarm set.   Therapy Documentation Precautions:  Precautions Precautions: Fall Restrictions Weight Bearing Restrictions: No General: PT Amount of Missed Time (min): 15 Minutes PT Missed Treatment Reason:  (MD in room)   See Function Navigator for Current Functional Status.   Therapy/Group: Individual Therapy  Sandi Mariscal 07/20/2017, 9:46 AM

## 2017-07-20 NOTE — Plan of Care (Signed)
Problem: RH BLADDER ELIMINATION Goal: RH STG MANAGE BLADDER WITH ASSISTANCE STG Manage Bladder With min Assistance   Outcome: Not Progressing Pt incontinent of urine requiring total assistance.

## 2017-07-20 NOTE — Progress Notes (Signed)
Occupational Therapy Session Note  Patient Details  Name: Randy Durham MRN: [id number] Date of Birth: September 24, 1934  Today's Date: 07/20/2017 OT Individual Time: 1130-1200 OT Individual Time Calculation (min): 30 min    Short Term Goals: Week 3:  OT Short Term Goal 1 (Week 3): STGs=LTGs due to ELOS   Skilled Therapeutic Interventions/Progress Updates:    Upon entering the room, pt supine in bed with eyes closed and very restless and lethargic this session. Pt with no signs or symptoms of pain this session. Pt's caregivers, his son and wife, present this session and OT reviewed PROM LE stretching program. Paper handout reviewed and demonstrated with caregivers returning demonstrations with min cues for technique. Encouragement to use bed use bed functions for proper body mechanics. Pt remained supine with family present in the room upon exiting.   Therapy Documentation Precautions:  Precautions Precautions: Fall Restrictions Weight Bearing Restrictions: No General: General PT Missed Treatment Reason:  (MD in room) Vital Signs:  Pain:   ADL: ADL ADL Comments: refer to functional navigator  See Function Navigator for Current Functional Status.   Therapy/Group: Individual Therapy  Alen Bleacher 07/20/2017, 12:33 PM

## 2017-07-20 NOTE — Progress Notes (Signed)
[facility] PHYSICAL MEDICINE & REHABILITATION     PROGRESS NOTE    Subjective/Complaints: Really no changes over the weekend. He has his moments when he's more lucid and speaking.   ROS: Limited due to cognitive/behavioral   Objective: Vital Signs: Blood pressure (!) 141/91, pulse (!) 116, temperature 98.4 F (36.9 C), temperature source Oral, resp. rate 18, height  (1.6 m), weight 73 kg (161 lb), SpO2 99 %. No results found. No results for input(s): WBC, HGB, HCT, PLT in the last 72 hours. No results for input(s): NA, K, CL, GLUCOSE, BUN, CREATININE, CALCIUM in the last 72 hours.  Invalid input(s): CO CBG (last 3)  No results for input(s): GLUCAP in the last 72 hours.  Wt Readings from Last 3 Encounters:  07/20/17 73 kg (161 lb)  06/30/17 81.6 kg (180 lb)  03/25/17 82.1 kg (181 lb)    Physical Exam:  Constitutional: No distress. Vital signs reviewed HENT: Normocephalic.  Eyes: opens eyes intermittently to verbal cues Cardiovascular: RRR without murmur. No JVD     Respiratory: CTA Bilaterally without wheezes or rales. Normal effort            GI: Bowel sounds are normal. He exhibits no distension. Skin: Skin is warm and dry. Craniotomy site clean and dry Neurological:   Does not open eyes. Stirs generally to painful stim.    Psych:  somnolent  Assessment/Plan: 1. Functional and cognitive deficits secondary to right temporal ICH which require 3+ hours per day of interdisciplinary therapy in a comprehensive inpatient rehab setting. Physiatrist is providing close team supervision and 24 hour management of active medical problems listed below. Physiatrist and rehab team continue to assess barriers to discharge/monitor patient progress toward functional and medical goals.  Function:  Bathing Bathing position   Position: Shower  Bathing parts   Body parts bathed by helper: Right arm, Left arm, Chest, Abdomen, Front perineal area, Buttocks, Right upper leg, Left upper  leg, Right lower leg, Left lower leg, Back  Bathing assist Assist Level: 2 helpers      Upper Body Dressing/Undressing Upper body dressing   What is the patient wearing?: Pull over shirt/dress       Pull over shirt/dress - Perfomed by helper: Thread/unthread left sleeve, Put head through opening, Pull shirt over trunk, Thread/unthread right sleeve        Upper body assist Assist Level: 2 helpers      Lower Body Dressing/Undressing Lower body dressing   What is the patient wearing?: Pants       Pants- Performed by helper: Thread/unthread right pants leg, Thread/unthread left pants leg, Pull pants up/down, Fasten/unfasten pants   Non-skid slipper socks- Performed by helper: Don/doff right sock, Don/doff left sock       Shoes - Performed by helper: Don/doff right shoe, Don/doff left shoe       TED Hose - Performed by helper: Don/doff right TED hose, Don/doff left TED hose  Lower body assist Assist for lower body dressing: 2 Helpers      Toileting Toileting Toileting activity did not occur: No continent bowel/bladder event   Toileting steps completed by helper: Adjust clothing prior to toileting, Performs perineal hygiene, Adjust clothing after toileting    Toileting assist Assist level: Two helpers   Transfers Chair/bed transfer   Chair/bed transfer method: Other Chair/bed transfer assist level: 2 helpers Chair/bed transfer assistive device: Systems developer lift: [place]   Locomotion Ambulation Ambulation activity did not occur: Safety/medical concerns (fatigue)  Max distance: 20 ft Assist level: 2 helpers   Education administrator activity did not occur: Safety/medical concerns     Assist Level: Dependent (Pt equals 0%)  Cognition Comprehension Comprehension assist level: Understands basic less than 25% of the time/ requires cueing >75% of the time  Expression Expression assist level: Expresses basis less than 25% of the time/requires cueing >75%  of the time.  Social Interaction Social Interaction assist level: Interacts appropriately less than 25% of the time. May be withdrawn or combative.  Problem Solving Problem solving assist level: Solves basic less than 25% of the time - needs direction nearly all the time or does not effectively solve problems and may need a restraint for safety  Memory Memory assist level: Recognizes or recalls less than 25% of the time/requires cueing greater than 75% of the time   Medical Problem List and Plan: 1. Decreased functional mobilitysecondary to right temporal ICH with IVH status post craniotomy for hematoma evacuation 06/24/2017 -appreciate Palliative care assistance.   - pursue SNF level care.  3. Pain Management/back pain: Discontinue hydrocodone due to somnolence.   Monitor mental status  Topamax qhs for headache  4. Mood:dc amantadine---change to ritalin beginning at noon today 5. Neuropsych: This patient is notcapable of making decisions on hisown behalf. -continue sleep chart -limiting neuro-sedating medications.  -stimulate during day with ritalin 6. Skin/Wound Care: Routine skin checks 7. Fluids/Electrolytes/Nutrition: encourage PO as possible    -full liquid diet. Family is pushing  -AKI,  -continue HS IVF  -BUN a little higher today 8.Seizure prophylaxis. Valproate 500 mg every 12 hours. EEG negative  -VPA level 55 on 8/29 9.CAD with cardiac catheterization 03/26/2015. Continue low-dose aspirin. 10.Hypertension/PAF. Lisinopril 5 mg daily, Lopressor 12.5 mg twice a day.   Norvasc 2.5 started on 9/1 to limit renal effects  Mild sys elevation otherwise ok Vitals:   07/19/17 2122 07/20/17 0614  BP: 135/74 (!) 141/91  Pulse: (!) 101 (!) 116  Resp:  18  Temp:  98.4 F (36.9 C)  SpO2:  99%   11.Hyperlipidemia. Lipitor 12.Hypothyroidism. Synthroid. Follow up levels as outpt.  13. UTI proteus: complete course of abx with amoxil 14.  Loose stool: add imodium/probiotic   -may be dietary too  LOS (Days) 20 A FACE TO FACE EVALUATION WAS PERFORMED  [name], MD 07/20/2017 9:48 AM

## 2017-07-21 ENCOUNTER — Inpatient Hospital Stay (HOSPITAL_COMMUNITY): Payer: Medicare Other | Admitting: Speech Pathology

## 2017-07-21 ENCOUNTER — Inpatient Hospital Stay (HOSPITAL_COMMUNITY): Payer: Medicare Other | Admitting: Occupational Therapy

## 2017-07-21 ENCOUNTER — Inpatient Hospital Stay (HOSPITAL_COMMUNITY): Payer: Medicare Other | Admitting: Physical Therapy

## 2017-07-21 LAB — BASIC METABOLIC PANEL
Anion gap: 7 (ref 5–15)
BUN: 32 mg/dL — AB (ref 6–20)
CALCIUM: 9.4 mg/dL (ref 8.9–10.3)
CHLORIDE: 104 mmol/L (ref 101–111)
CO2: 26 mmol/L (ref 22–32)
CREATININE: 1.12 mg/dL (ref 0.61–1.24)
GFR calc non Af Amer: 60 mL/min — ABNORMAL LOW (ref >60)
GLUCOSE: 143 mg/dL — AB (ref 65–99)
Potassium: 3.8 mmol/L (ref 3.5–5.1)
SODIUM: 137 mmol/L (ref 135–145)

## 2017-07-21 MED ORDER — TOPIRAMATE 25 MG PO TABS: 25.0000 mg | ORAL_TABLET | Freq: Every day | ORAL | 0 refills | Status: DC

## 2017-07-21 MED ORDER — VALPROATE SODIUM 250 MG/5ML PO SOLN: 500.0000 mg | Freq: Two times a day (BID) | ORAL | Status: AC

## 2017-07-21 MED ORDER — SACCHAROMYCES BOULARDII 250 MG PO CAPS: 250.0000 mg | ORAL_CAPSULE | Freq: Two times a day (BID) | ORAL | Status: AC

## 2017-07-21 MED ORDER — LISINOPRIL 5 MG PO TABS
5.0000 mg | ORAL_TABLET | Freq: Every day | ORAL | Status: DC
Start: 2017-07-21 — End: 2017-12-02

## 2017-07-21 MED ORDER — AMLODIPINE BESYLATE 2.5 MG PO TABS: 2.5000 mg | ORAL_TABLET | Freq: Every day | ORAL | Status: DC

## 2017-07-21 MED ORDER — SODIUM CHLORIDE 0.45 % IV SOLN
INTRAVENOUS | Status: DC
  Administered 2017-07-21: 18:00:00 via INTRAVENOUS

## 2017-07-21 MED ORDER — APIXABAN 5 MG PO TABS: 5.0000 mg | ORAL_TABLET | Freq: Two times a day (BID) | ORAL | Status: DC

## 2017-07-21 MED ORDER — METHYLPHENIDATE HCL 10 MG PO TABS: 10.0000 mg | ORAL_TABLET | Freq: Two times a day (BID) | ORAL | 0 refills | Status: DC

## 2017-07-21 MED ORDER — AMOXICILLIN 250 MG/5ML PO SUSR: 250.0000 mg | Freq: Three times a day (TID) | ORAL | 0 refills | Status: DC

## 2017-07-21 NOTE — Progress Notes (Signed)
Nutrition Follow-up  DOCUMENTATION CODES:   Obesity unspecified  INTERVENTION:  Continue Ensure Enlive po TID, each supplement provides 350 kcal and 20 grams of protein.  Continue 30 ml Prostat po BID, each supplement provides 100 kcal and 15 grams of protein.   Recommend continuation of nutritional supplements post discharge to aid in caloric and protein needs.   NUTRITION DIAGNOSIS:   Inadequate oral intake related to lethargy/confusion as evidenced by meal completion < 50%, per patient/family report; ongoing  GOAL:   Patient will meet greater than or equal to 90% of their needs; progressed  MONITOR:   PO intake, Diet advancement, I & O's  REASON FOR ASSESSMENT:   Consult Calorie Count  ASSESSMENT:   Pt with PMH of CAD, NSTEMI, PAF, HTN, CKD stage III who was recently hospitalized for large R ICH and s/p crani admitted to rehab.  Pt continues on a full liquid diet. Meal completion 25% today. Pt was unavailable during attempted time of visit. Pt currently has Ensure and Prostat ordered and has been consuming them. RD to continue with current orders. Plans for discharging to SNF tomorrow. Recommend continuation of nutritional supplements to aid in adequate nutrition. Labs and medications reviewed.   Diet Order:  Diet full liquid Room service appropriate? Yes; Fluid consistency: Thin  Skin:   (Incision to R head)  Last BM:  9/18  Height:   Ht Readings from Last 1 Encounters:  06/30/17  (1.6 m)    Weight:   Wt Readings from Last 1 Encounters:  07/21/17 162 lb (73.5 kg)    Ideal Body Weight:  56.3 kg  BMI:  Body mass index is 28.7 kg/m.  Estimated Nutritional Needs:   Kcal:  1700-1900  Protein:  95-110 grams  Fluid:  > 1.7 L/day  EDUCATION NEEDS:   No education needs identified at this time  Roslyn Smiling, MS, RD, LDN Pager # [phone] After hours/ weekend pager # [phone]

## 2017-07-21 NOTE — Progress Notes (Addendum)
Physical Therapy Session Note  Patient Details  Name: Randy Durham MRN: [id number] Date of Birth: 06/11/1934  Today's Date: 07/21/2017 PT Individual Time: 1031-1100 PT Individual Time Calculation (min): 29 min    Skilled Therapeutic Interventions/Progress Updates:  Pt received in bed with family present. Therapist assisted pt with donning gown & ted hose dependent assist. Pt rolled L<>R with total assist to allow therapist to place sling and pt transferred bed>TIS w/c via [facility] for safety. Pt requires +2 dependent assist to reposition in w/c but continues to push to R and lean in chair. Provided HOH assist to wash pt's face with wet cloth but pt with minimal to no initiation of task. Pt requires dependent assist for donning glasses. Dependently propelled pt around unit in w/c to attempt to increase alertness but with no success; pt also unable to respond to therapist touching his LE. At end of session pt left sitting in TIS w/c in room with family present to supervise.   Therapy Documentation Precautions:  Precautions Precautions: Fall Restrictions Weight Bearing Restrictions: No   See Function Navigator for Current Functional Status.   Therapy/Group: Individual Therapy  Sandi Mariscal 07/21/2017, 12:39 PM

## 2017-07-21 NOTE — Progress Notes (Signed)
[place] PHYSICAL MEDICINE & REHABILITATION     PROGRESS NOTE    Subjective/Complaints: Had a good night. Pt comfortable per son.   ROS: Limited due to cognitive/behavioral    Objective: Vital Signs: Blood pressure (!) 141/95, pulse 100, temperature 98.3 F (36.8 C), temperature source Oral, resp. rate 20, height  (1.6 m), weight 73.5 kg (162 lb), SpO2 97 %. No results found. No results for input(s): WBC, HGB, HCT, PLT in the last 72 hours.  Recent Labs  07/20/17 0840  NA 138  K 4.1  CL 106  GLUCOSE 105*  BUN 32*  CREATININE 1.02  CALCIUM 9.5   CBG (last 3)  No results for input(s): GLUCAP in the last 72 hours.  Wt Readings from Last 3 Encounters:  07/21/17 73.5 kg (162 lb)  06/30/17 81.6 kg (180 lb)  03/25/17 82.1 kg (181 lb)    Physical Exam:  Constitutional: No distress. Vital signs reviewed HENT: Normocephalic.  Eyes: opens eyes intermittently to verbal cues Cardiovascular: RRR without murmur. No JVD      Respiratory: CTA Bilaterally without wheezes or rales. Normal effort            GI: Bowel sounds are normal. He exhibits no distension. Skin: Skin is warm and dry. Craniotomy site clean and dry Neurological:   Does not open eyes. Moves all 4's. Generalized reaction to sternal rub    Psych:  somnolent  Assessment/Plan: 1. Functional and cognitive deficits secondary to right temporal ICH which require 3+ hours per day of interdisciplinary therapy in a comprehensive inpatient rehab setting. Physiatrist is providing close team supervision and 24 hour management of active medical problems listed below. Physiatrist and rehab team continue to assess barriers to discharge/monitor patient progress toward functional and medical goals.  Function:  Bathing Bathing position   Position: Shower  Bathing parts   Body parts bathed by helper: Right arm, Left arm, Chest, Abdomen, Front perineal area, Buttocks, Right upper leg, Left upper leg, Right lower leg,  Left lower leg, Back  Bathing assist Assist Level: 2 helpers      Upper Body Dressing/Undressing Upper body dressing   What is the patient wearing?: Pull over shirt/dress       Pull over shirt/dress - Perfomed by helper: Thread/unthread left sleeve, Put head through opening, Pull shirt over trunk, Thread/unthread right sleeve        Upper body assist Assist Level: 2 helpers      Lower Body Dressing/Undressing Lower body dressing   What is the patient wearing?: Pants       Pants- Performed by helper: Thread/unthread right pants leg, Thread/unthread left pants leg, Pull pants up/down, Fasten/unfasten pants   Non-skid slipper socks- Performed by helper: Don/doff right sock, Don/doff left sock       Shoes - Performed by helper: Don/doff right shoe, Don/doff left shoe       TED Hose - Performed by helper: Don/doff right TED hose, Don/doff left TED hose  Lower body assist Assist for lower body dressing: 2 Helpers      Toileting Toileting Toileting activity did not occur: No continent bowel/bladder event   Toileting steps completed by helper: Adjust clothing prior to toileting, Performs perineal hygiene, Adjust clothing after toileting    Toileting assist Assist level: Two helpers   Transfers Chair/bed transfer   Chair/bed transfer method: Other Chair/bed transfer assist level: 2 helpers Chair/bed transfer assistive device: Systems developer lift: Air traffic controller Ambulation activity did not  occur: Safety/medical concerns (fatigue)   Max distance: 20 ft Assist level: 2 helpers   Education administrator activity did not occur: Safety/medical concerns     Assist Level: Dependent (Pt equals 0%)  Cognition Comprehension Comprehension assist level: Understands basic less than 25% of the time/ requires cueing >75% of the time  Expression Expression assist level: Expresses basis less than 25% of the time/requires cueing >75% of the time.  Social  Interaction Social Interaction assist level: Interacts appropriately less than 25% of the time. May be withdrawn or combative.  Problem Solving Problem solving assist level: Solves basic less than 25% of the time - needs direction nearly all the time or does not effectively solve problems and may need a restraint for safety  Memory Memory assist level: Recognizes or recalls less than 25% of the time/requires cueing greater than 75% of the time   Medical Problem List and Plan: 1. Decreased functional mobilitysecondary to right temporal ICH with IVH status post craniotomy for hematoma evacuation 06/24/2017 -appreciate Palliative care assistance.   - pursuing SNF  .  3. Pain Management/back pain: Discontinue hydrocodone due to somnolence.   Monitor mental status  Topamax qhs for headache  4. Mood:dc amantadine---change to ritalin beginning at noon today 5. Neuropsych: This patient is notcapable of making decisions on hisown behalf. -continue sleep chart -limiting neuro-sedating medications.  -stimulate during day with ritalin. ?effect 6. Skin/Wound Care: Routine skin checks 7. Fluids/Electrolytes/Nutrition: encourage PO as possible    -full liquid diet. Family is pushing  -AKI,  -continue HS IVF  -recheck lab in AM 8.Seizure prophylaxis. Valproate 500 mg every 12 hours. EEG negative  -VPA level 55 on 8/29 9.CAD with cardiac catheterization 03/26/2015. Continue low-dose aspirin. 10.Hypertension/PAF. Lisinopril 5 mg daily, Lopressor 12.5 mg twice a day.   Norvasc 2.5 started on 9/1 to limit renal effects  Mild sys elevation otherwise ok Vitals:   07/20/17 2106 07/21/17 0600  BP: (!) 134/95 (!) 141/95  Pulse: (!) 102 100  Resp:  20  Temp:  98.3 F (36.8 C)  SpO2:  97%   11.Hyperlipidemia. Lipitor 12.Hypothyroidism. Synthroid. Follow up levels as outpt.  13. UTI proteus: complete course of abx with amoxil 14. Loose stool: added  imodium/probiotic with some benefit   -may be dietary too  LOS (Days) 21 A FACE TO FACE EVALUATION WAS PERFORMED  [name], MD 07/21/2017 9:08 AM

## 2017-07-21 NOTE — Plan of Care (Signed)
Problem: RH Balance Goal: LTG: Patient will maintain dynamic sitting balance (OT) LTG:  Patient will maintain dynamic sitting balance with assistance during activities of daily living (OT)  Outcome: Not Met (add Reason) Not met secondary to functional declined in status Goal: LTG Patient will maintain dynamic standing with ADLs (OT) LTG:  Patient will maintain dynamic standing balance with assist during activities of daily living (OT)   Outcome: Not Met (add Reason) Not met secondary to functional declined in status  Problem: RH Eating Goal: LTG Patient will perform eating w/assist, cues/equip (OT) LTG: Patient will perform eating with assist, with/without cues using equipment (OT)  Outcome: Not Met (add Reason) Not met secondary to functional declined in status  Problem: RH Grooming Goal: LTG Patient will perform grooming w/assist,cues/equip (OT) LTG: Patient will perform grooming with assist, with/without cues using equipment (OT)  Outcome: Not Met (add Reason) Not met secondary to functional declined in status  Problem: RH Bathing Goal: LTG Patient will bathe with assist, cues/equipment (OT) LTG: Patient will bathe specified number of body parts with assist with/without cues using equipment (position)  (OT)  Outcome: Not Met (add Reason) Not met secondary to functional declined in status  Problem: RH Dressing Goal: LTG Patient will perform upper body dressing (OT) LTG Patient will perform upper body dressing with assist, with/without cues (OT).  Outcome: Not Met (add Reason) Not met secondary to functional declined in status Goal: LTG Patient will perform lower body dressing w/assist (OT) LTG: Patient will perform lower body dressing with assist, with/without cues in positioning using equipment (OT)  Outcome: Not Met (add Reason) Not met secondary to functional declined in status  Problem: RH Toileting Goal: LTG Patient will perform toileting w/assist, cues/equip (OT) LTG:  Patient will perform toiletiing (clothes management/hygiene) with assist, with/without cues using equipment (OT)  Outcome: Not Met (add Reason) Not met secondary to functional declined in status  Problem: RH Functional Use of Upper Extremity Goal: LTG Patient will use RT/LT upper extremity as a (OT) LTG: Patient will use right/left upper extremity as a stabilizer/gross assist/diminished/nondominant/dominant level with assist, with/without cues during functional activity (OT)  Outcome: Not Met (add Reason) Not met secondary to functional declined in status  Problem: RH Toilet Transfers Goal: LTG Patient will perform toilet transfers w/assist (OT) LTG: Patient will perform toilet transfers with assist, with/without cues using equipment (OT)  Outcome: Not Met (add Reason) Not met secondary to functional declined in status  Problem: RH Attention Goal: LTG Patient will demonstrate focused/sustained (OT) LTG:  Patient will demonstrate focused/sustained/selective/alternating/divided attention during functional activities in specific environment with assist for # of minutes  (OT)  Outcome: Not Met (add Reason) Not met secondary to functional declined in status

## 2017-07-21 NOTE — Progress Notes (Signed)
Speech Language Pathology Daily Session Note  Patient Details  Name: Randy Durham MRN: [id number] Date of Birth: 02-Jun-1934  Today's Date: 07/21/2017 SLP Individual Time: 4098-1191 SLP Individual Time Calculation (min): 25 min  Short Term Goals: Week 3: SLP Short Term Goal 1 (Week 3): Pt will communicate basic wants and needs via multimodal communciation with Mod A verbal cues. SLP Short Term Goal 2 (Week 3): Pt will answer yes/no questions in 10% of opportunities with Max A multimodal cues in regards to wants/needs. SLP Short Term Goal 3 (Week 3): Patient will keep his eyes open for ~2 minute intervals with Max A multimodal cues.  SLP Short Term Goal 4 (Week 3): Pt will initate self-feeding of liquids in 50% of opportunties with Max multimodal cues. SLP Short Term Goal 5 (Week 3): Patient will consume thin liquids without overt s/s of aspiration with Max A verbal cues.   Skilled Therapeutic Interventions: Skilled treatment session focused on dysphagia and cognitive goals. SLP facilitated session by providing skilled observations with thin liquids via straw. Patient consumed thin liquids without overt s/s of aspiration with total A for initiation, attention and arousal with patient's eyes remaining closed throughout session. Patient's son present throughout session. Patient left in bed with all needs within reach. Continue with current plan of care.      Function:  Eating Eating   Modified Consistency Diet: No Eating Assist Level: Helper feeds patient           Cognition Comprehension Comprehension assist level: Understands basic less than 25% of the time/ requires cueing >75% of the time  Expression   Expression assist level: Expresses basis less than 25% of the time/requires cueing >75% of the time.  Social Interaction Social Interaction assist level: Interacts appropriately less than 25% of the time. May be withdrawn or combative.  Problem Solving Problem solving assist level:  Solves basic less than 25% of the time - needs direction nearly all the time or does not effectively solve problems and may need a restraint for safety  Memory Memory assist level: Recognizes or recalls less than 25% of the time/requires cueing greater than 75% of the time    Pain Pain Assessment Faces Pain Scale: Hurts a little bit PAINAD (Pain Assessment in Advanced Dementia) Facial Expression: sad, frightened, frown  Therapy/Group: Individual Therapy  Anushri Casalino 07/21/2017, 2:39 PM

## 2017-07-21 NOTE — Progress Notes (Signed)
Occupational Therapy Discharge Summary  Patient Details  Name: Randy Durham MRN: [id number] Date of Birth: November 29, 1933  Today's Date: 07/21/2017 OT Individual Time: 7517-0017 OT Individual Time Calculation (min): 29 min    Pt did not met any long term occupational therapy goals secondary to recent functional decline.  Patient to discharge at overall Total Assist level.  Patient's caregivers, wife and son, have been educated on various HEPs and positioning.   Reasons goals not met: goals not met secondary to functional decline.   Recommendation:  Patient will benefit from ongoing skilled OT services in skilled nursing facility setting to continue to advance functional skills in the area of Reduce care partner burden.  Equipment: to be determined by next venue of care  Reasons for discharge: lack of progress toward goals  Patient/family agrees with progress made and goals achieved: Yes   OT Intervention: Pt supine in bed and comfortable with caregivers, son and wife, present within room. Caregiver's asking therapist to not get pt out of bed and allow him to rest. OT reviewed proper bed positioning with caregivers with concerns for skin integrity based on needed assistance at this time. Caregiver's also with several questions regarding upcoming SNF discharge. All questions answered regarding patient care until caregivers satisfied. Pt remained supine in bed and caregivers within the room.    OT Discharge Precautions/Restrictions  Precautions Precautions: Fall Restrictions Weight Bearing Restrictions: No General   Vital Signs Therapy Vitals Temp: 97.8 F (36.6 C) Temp Source: Oral Pulse Rate: 85 Resp: 18 BP: 126/85 Patient Position (if appropriate): Lying Oxygen Therapy SpO2: 90 % O2 Device: Not Delivered Pain Pain Assessment Pain Assessment: Faces Faces Pain Scale: No hurt PAINAD (Pain Assessment in Advanced Dementia) Facial Expression: sad, frightened,  frown ADL ADL ADL Comments: refer to functional navigator Vision Baseline Vision/History: Wears glasses Wears Glasses: At all times Vision Assessment?:  (vision impaired but unable to test 2/2 impaired cognition & arousal) Additional Comments: impaired vision but difficult to assess secondary to cognition and decreased alertness Cognition Overall Cognitive Status: Impaired/Different from baseline Arousal/Alertness: Lethargic Orientation Level:  (pt does not respond to his name) Safety/Judgment: Impaired Glass blower/designer - Skilled Clinical Observations: significant general weakness Mobility  Bed Mobility Bed Mobility: Rolling Right;Rolling Left Rolling Right: 1: +2 Total assist Rolling Right Details: Manual facilitation for weight shifting;Manual facilitation for placement Rolling Left: 1: +2 Total assist Rolling Left Details: Manual facilitation for weight shifting;Manual facilitation for placement Supine to Sit: 1: +2 Total assist  Extremity/Trunk Assessment RUE Assessment RUE Assessment:  (unable to formally assess but appears to be 3-/5) LUE Assessment LUE Assessment: Exceptions to [facility] (unable to formally assess but appears to be 3-/5)   See Function Navigator for Current Functional Status.  Gypsy Decant 07/21/2017, 4:36 PM

## 2017-07-21 NOTE — Discharge Summary (Signed)
Discharge summary job # [id number]

## 2017-07-21 NOTE — Progress Notes (Signed)
Physical Therapy Discharge Summary  Patient Details  Name: Randy Durham MRN: [id number] Date of Birth: Dec 06, 1933  Today's Date: 07/21/2017   Patient did not meet any LTG's 2/2 significant functional decline since PT evaluation.  Patient currently requires total assist for rolling L<>R, mechanical lift for bed<>TIS w/c transfers, and dependent assist for w/c mobility. Pt has been limited by significant functional and cognitive decline and increased lethargy that limits participation in physical therapy sessions. Pt with poor awareness and does not consistently respond to tactile or verbal stimulation.   Recommendation:  Patient will benefit from ongoing skilled PT services in skilled nursing facility setting to continue to advance safe functional mobility, address ongoing impairments in impaired sitting balance, bed mobility, and bed<>chair transfers to minimize fall risk and reduce burden of care.  Equipment: No equipment provided, TBD in next venue of care  Reasons for discharge: lack of progress toward goals and discharge from hospital  Patient/family agrees with progress made and goals achieved: Yes  PT Discharge Precautions/Restrictions Precautions Precautions: Fall Restrictions Weight Bearing Restrictions: No  Vision/Perception  Pt wears glasses at all times at baseline. Pt with visual deficits compared to baseline level of vision but difficult to assess 2/2 significantly impaired cognition.  Cognition Overall Cognitive Status: Impaired/Different from baseline Arousal/Alertness: Lethargic Orientation Level:  (pt does not respond to his name) Safety/Judgment: Impaired  Customer service manager - Skilled Clinical Observations: significant general weakness   Mobility Bed Mobility Bed Mobility: Rolling Right;Rolling Left Rolling Right: 1: +2 Total assist Rolling Right Details: Manual facilitation for weight shifting;Manual facilitation for placement Rolling Left: 1: +2 Total  assist Rolling Left Details: Manual facilitation for weight shifting;Manual facilitation for placement Transfers Transfers: Yes Transfer via Lift Equipment: [place]  Locomotion  Ambulation Ambulation: No Gait Gait: No Stairs / Additional Locomotion Stairs: No Wheelchair Mobility Wheelchair Mobility: No   Extremity Assessment  RLE Assessment RLE Assessment:  (PROM WFL) LLE Assessment LLE Assessment:  (PROM WFL)   See Function Navigator for Current Functional Status.  Sandi Mariscal 07/21/2017, 3:54 PM

## 2017-07-21 NOTE — Plan of Care (Signed)
Problem: RH Balance Goal: LTG Patient will maintain dynamic sitting balance (PT) LTG:  Patient will maintain dynamic sitting balance with assistance during mobility activities (PT)  Outcome: Not Met (add Reason) Not met as pt unable to participate in past few days 2/2 functional decline Goal: LTG Patient will maintain dynamic standing balance (PT) LTG:  Patient will maintain dynamic standing balance with assistance during mobility activities (PT)  Outcome: Not Met (add Reason) Unable to attempt 2/2 functional decline  Problem: RH Bed Mobility Goal: LTG Patient will perform bed mobility with assist (PT) LTG: Patient will perform bed mobility with assistance, with/without cues (PT).  Outcome: Not Met (add Reason) Not met 2/2 functional decline  Problem: RH Bed to Chair Transfers Goal: LTG Patient will perform bed/chair transfers w/assist (PT) LTG: Patient will perform bed/chair transfers with assistance, with/without cues (PT).  Outcome: Not Met (add Reason) Mechanical lift 2/2 functional decline  Problem: RH Car Transfers Goal: LTG Patient will perform car transfers with assist (PT) LTG: Patient will perform car transfers with assistance (PT).  Outcome: Not Met (add Reason) Not attempted 2/2 functional decline

## 2017-07-21 NOTE — Discharge Instructions (Signed)
Inpatient Rehab Discharge Instructions  Anvith Mauriello Discharge date and time: No discharge date for patient encounter.   Activities/Precautions/ Functional Status: Activity: activity as tolerated Diet: Full liquid diet Wound Care: none needed Functional status:  ___ No restrictions     ___ Walk up steps independently ___ 24/7 supervision/assistance   ___ Walk up steps with assistance ___ Intermittent supervision/assistance  ___ Bathe/dress independently ___ Walk with walker     _x__ Bathe/dress with assistance ___ Walk Independently    ___ Shower independently ___ Walk with assistance    ___ Shower with assistance ___ No alcohol     ___ Return to work/school ________  Special Instructions:    My questions have been answered and I understand these instructions. I will adhere to these goals and the provided educational materials after my discharge from the hospital.  Patient/Caregiver Signature _______________________________ Date __________  Clinician Signature _______________________________________ Date __________  Please bring this form and your medication list with you to all your follow-up doctor's appointments.

## 2017-07-21 NOTE — Discharge Summary (Signed)
NAMEDEMERIUS, PODOLAK              ACCOUNT NO.:  [id number]  MEDICAL RECORD NO.:  [id number]  LOCATION:  4W15C                        FACILITY:  [facility]  PHYSICIAN:  [name], M.D.DATE OF BIRTH:  [birth year]  DATE OF ADMISSION:  06/30/2017 DATE OF DISCHARGE:  07/22/2017                              DISCHARGE SUMMARY   DISCHARGE DIAGNOSES: 1. Right temporal intracerebral hemorrhage with intraventricular     hemorrhage, status post craniotomy for hematoma evacuation on     June 24, 2017. 2. Pain management. 3. Mood. 4. Decreased nutritional storage. 5. Seizure prophylaxis. 6. Coronary artery disease with cardiac catheterization on Mar 26, 2015. 7. Hypertension with PAF. 8. Hyperlipidemia. 9. Proteus urinary tract infection. 10.Hypothyroidism. 11.AKI  HISTORY OF PRESENT ILLNESS:  This is an 83 year old right-handed male with history of spinal stenosis; CAD with PAF, maintained on Eliquis; hypertension; and CKD stage 3.  Lives with his wife; was independent prior to admission.  Presented on June 24, 2017, with somnolence, increasing headache, and bouts of nausea, vomiting, and chills.  CT of the head showed a large right temporal intraparenchymal hemorrhage with intraventricular extension and mild surrounding edema.  Underwent craniotomy, right temporal, for evacuation of hematoma on June 24, 2017, per Dr. Franky Macho.  Maintained on valproate for seizure prophylaxis. Latest followup cranial CT scan showed no new mass effect or midline shift.  EEG negative for seizure.  The patient had been placed on amantadine twice daily.  His Eliquis was discontinued due to ICH, initially placed on low-dose aspirin, plan to resume Eliquis per Neurosurgery.  Subcutaneous heparin for DVT prophylaxis.  The patient was admitted for a comprehensive rehab program.  PAST MEDICAL HISTORY:  See discharge diagnoses.  SOCIAL HISTORY:  Lives with wife, independent prior to  admission.  FUNCTIONAL STATUS UPON ADMISSION TO REHAB SERVICES:  Total assist sit-to- stand, total assist sit-to-supine, max total assist activities of daily living.  PHYSICAL EXAMINATION:  VITAL SIGNS:  Blood pressure 134/79, pulse 80, temperature 98, and respirations 18. GENERAL:  This was a lethargic male.  He was arousable.  He would answer some yes, no questions, but inconsistent. EYES:  Pupils were pinpoint. NECK:  No JVD. CARDIAC:  Rate controlled. ABDOMEN:  Soft, nontender.  Good bowel sounds. LUNGS:  Decreased breath sounds.  Clear to auscultation.  REHABILITATION HOSPITAL COURSE:  The patient was admitted to Inpatient Rehab Services.  Therapies initiated on a 3-hour daily basis, consisting of physical therapy, occupational therapy, speech therapy, and rehabilitation nursing.  The following issues were addressed during the patient's rehabilitation stay:  Pertaining to Mr. Ethier's right temporal ICH with intraventricular hemorrhage, he had undergone craniotomy, surgical site healing nicely.  He would follow up with Neurosurgery.  Bouts of headache, improving with the use of Topamax. Mood with bouts of somnolence; trials of Ritalin had been initiated; his amantadine had since been discontinued.  Close monitoring of renal function, CKD stage 3; renal function stable.  He continued on valproate for seizure prophylaxis.  EEG was negative.  He had no chest pain or shortness of breath.  During his rehab course, with noted history of PAF, his Eliquis had later been resumed.  Blood pressures  controlled on present regimen. Full liquid diet with monitoring of hydration receiving IV fluids daily at bedtime.  He was completed a course of amoxicillin for UTI, remaining afebrile.  The patient received weekly collaborative interdisciplinary team conferences to discuss estimated length of stay, family teaching, any barriers to his discharge.  The patient with very slow gains while on the  rehab unit.  Ongoing education providing medical followup with family.  Palliative Care has been consulted to establish goals of care.  The patient could roll in the bed with max assist. Inconsistencies to attending any therapies, attempted to perform supine sitting edge of bed, but the patient was somewhat resistant, lethargic. Due to his overall limited gains, multiple discussions with family as well as followup per Palliative Care, it was felt skilled nursing facility was needed with bed becoming available on July 22, 2017.  DISCHARGE MEDICATIONS:  Included, 1. Norvasc 2.5 mg p.o. daily. 2. Eliquis 5 mg p.o. b.i.d. 3. Lipitor 80 mg p.o. daily. 4. Synthroid 112 mcg p.o. daily. 5. Lisinopril 5 mg p.o. at bedtime. 6. Ritalin 10 mg p.o. b.i.d. 7. Lopressor 12.5 mg p.o. b.i.d. 8. Florastor 250 mg p.o. b.i.d. 9. Topamax 25 mg p.o. daily. 10.Valproate sodium 500 mg p.o. b.i.d. 11. 0.45% IV fluids 50 mL an hour 7P to 7 AM   DIET:  His diet was a full liquid diet, meds crushed in puree.  FOLLOWUP:  The patient would follow up with Dr. Faith Rogue at the Outpatient Rehab Service office in 1 month and Dr. Coletta Memos, Neurosurgery, call for appointment.     Mariam Dollar, P.A.   ______________________________ [name], M.D.    DA/MEDQ  D:  07/21/2017  T:  07/21/2017  Job:  [id number]  cc:   [name], M.D.

## 2017-07-22 DIAGNOSIS — R630 Anorexia: Secondary | ICD-10-CM | POA: Diagnosis not present

## 2017-07-22 DIAGNOSIS — Z23 Encounter for immunization: Secondary | ICD-10-CM | POA: Diagnosis not present

## 2017-07-22 DIAGNOSIS — R633 Feeding difficulties: Secondary | ICD-10-CM | POA: Diagnosis not present

## 2017-07-22 DIAGNOSIS — E440 Moderate protein-calorie malnutrition: Secondary | ICD-10-CM | POA: Diagnosis not present

## 2017-07-22 DIAGNOSIS — G936 Cerebral edema: Secondary | ICD-10-CM | POA: Diagnosis not present

## 2017-07-22 DIAGNOSIS — M6281 Muscle weakness (generalized): Secondary | ICD-10-CM | POA: Diagnosis not present

## 2017-07-22 DIAGNOSIS — I619 Nontraumatic intracerebral hemorrhage, unspecified: Secondary | ICD-10-CM | POA: Diagnosis not present

## 2017-07-22 DIAGNOSIS — R638 Other symptoms and signs concerning food and fluid intake: Secondary | ICD-10-CM | POA: Diagnosis not present

## 2017-07-22 DIAGNOSIS — R634 Abnormal weight loss: Secondary | ICD-10-CM | POA: Diagnosis not present

## 2017-07-22 DIAGNOSIS — I2510 Atherosclerotic heart disease of native coronary artery without angina pectoris: Secondary | ICD-10-CM | POA: Diagnosis not present

## 2017-07-22 DIAGNOSIS — Z9889 Other specified postprocedural states: Secondary | ICD-10-CM | POA: Diagnosis not present

## 2017-07-22 DIAGNOSIS — E46 Unspecified protein-calorie malnutrition: Secondary | ICD-10-CM | POA: Diagnosis not present

## 2017-07-22 DIAGNOSIS — I129 Hypertensive chronic kidney disease with stage 1 through stage 4 chronic kidney disease, or unspecified chronic kidney disease: Secondary | ICD-10-CM | POA: Diagnosis not present

## 2017-07-22 DIAGNOSIS — R1312 Dysphagia, oropharyngeal phase: Secondary | ICD-10-CM | POA: Diagnosis not present

## 2017-07-22 DIAGNOSIS — Z7189 Other specified counseling: Secondary | ICD-10-CM | POA: Diagnosis not present

## 2017-07-22 DIAGNOSIS — W19XXXA Unspecified fall, initial encounter: Secondary | ICD-10-CM | POA: Diagnosis not present

## 2017-07-22 DIAGNOSIS — R627 Adult failure to thrive: Secondary | ICD-10-CM | POA: Diagnosis not present

## 2017-07-22 DIAGNOSIS — M4800 Spinal stenosis, site unspecified: Secondary | ICD-10-CM | POA: Diagnosis not present

## 2017-07-22 DIAGNOSIS — I639 Cerebral infarction, unspecified: Secondary | ICD-10-CM | POA: Diagnosis not present

## 2017-07-22 DIAGNOSIS — I69120 Aphasia following nontraumatic intracerebral hemorrhage: Secondary | ICD-10-CM | POA: Diagnosis not present

## 2017-07-22 DIAGNOSIS — N390 Urinary tract infection, site not specified: Secondary | ICD-10-CM | POA: Diagnosis not present

## 2017-07-22 DIAGNOSIS — R400 Somnolence: Secondary | ICD-10-CM | POA: Diagnosis not present

## 2017-07-22 DIAGNOSIS — Z8249 Family history of ischemic heart disease and other diseases of the circulatory system: Secondary | ICD-10-CM | POA: Diagnosis not present

## 2017-07-22 DIAGNOSIS — E669 Obesity, unspecified: Secondary | ICD-10-CM | POA: Diagnosis not present

## 2017-07-22 DIAGNOSIS — I2582 Chronic total occlusion of coronary artery: Secondary | ICD-10-CM | POA: Diagnosis not present

## 2017-07-22 DIAGNOSIS — K9429 Other complications of gastrostomy: Secondary | ICD-10-CM | POA: Diagnosis not present

## 2017-07-22 DIAGNOSIS — N183 Chronic kidney disease, stage 3 (moderate): Secondary | ICD-10-CM | POA: Diagnosis not present

## 2017-07-22 DIAGNOSIS — L600 Ingrowing nail: Secondary | ICD-10-CM | POA: Diagnosis not present

## 2017-07-22 DIAGNOSIS — R278 Other lack of coordination: Secondary | ICD-10-CM | POA: Diagnosis not present

## 2017-07-22 DIAGNOSIS — R41841 Cognitive communication deficit: Secondary | ICD-10-CM | POA: Diagnosis not present

## 2017-07-22 DIAGNOSIS — E039 Hypothyroidism, unspecified: Secondary | ICD-10-CM | POA: Diagnosis not present

## 2017-07-22 DIAGNOSIS — R4701 Aphasia: Secondary | ICD-10-CM | POA: Diagnosis not present

## 2017-07-22 DIAGNOSIS — L03032 Cellulitis of left toe: Secondary | ICD-10-CM | POA: Diagnosis not present

## 2017-07-22 DIAGNOSIS — B370 Candidal stomatitis: Secondary | ICD-10-CM | POA: Diagnosis not present

## 2017-07-22 DIAGNOSIS — I480 Paroxysmal atrial fibrillation: Secondary | ICD-10-CM | POA: Diagnosis not present

## 2017-07-22 DIAGNOSIS — Z8042 Family history of malignant neoplasm of prostate: Secondary | ICD-10-CM | POA: Diagnosis not present

## 2017-07-22 DIAGNOSIS — R531 Weakness: Secondary | ICD-10-CM | POA: Diagnosis not present

## 2017-07-22 DIAGNOSIS — Z809 Family history of malignant neoplasm, unspecified: Secondary | ICD-10-CM | POA: Diagnosis not present

## 2017-07-22 DIAGNOSIS — I255 Ischemic cardiomyopathy: Secondary | ICD-10-CM | POA: Diagnosis not present

## 2017-07-22 DIAGNOSIS — E785 Hyperlipidemia, unspecified: Secondary | ICD-10-CM | POA: Diagnosis not present

## 2017-07-22 DIAGNOSIS — R2689 Other abnormalities of gait and mobility: Secondary | ICD-10-CM | POA: Diagnosis not present

## 2017-07-22 DIAGNOSIS — Z7901 Long term (current) use of anticoagulants: Secondary | ICD-10-CM | POA: Diagnosis not present

## 2017-07-22 DIAGNOSIS — Z6826 Body mass index (BMI) 26.0-26.9, adult: Secondary | ICD-10-CM | POA: Diagnosis not present

## 2017-07-22 DIAGNOSIS — G464 Cerebellar stroke syndrome: Secondary | ICD-10-CM | POA: Diagnosis not present

## 2017-07-22 DIAGNOSIS — L03119 Cellulitis of unspecified part of limb: Secondary | ICD-10-CM | POA: Diagnosis not present

## 2017-07-22 DIAGNOSIS — I611 Nontraumatic intracerebral hemorrhage in hemisphere, cortical: Secondary | ICD-10-CM | POA: Diagnosis not present

## 2017-07-22 DIAGNOSIS — Z8673 Personal history of transient ischemic attack (TIA), and cerebral infarction without residual deficits: Secondary | ICD-10-CM | POA: Diagnosis not present

## 2017-07-22 DIAGNOSIS — I252 Old myocardial infarction: Secondary | ICD-10-CM | POA: Diagnosis not present

## 2017-07-22 DIAGNOSIS — I959 Hypotension, unspecified: Secondary | ICD-10-CM | POA: Diagnosis not present

## 2017-07-22 DIAGNOSIS — R5383 Other fatigue: Secondary | ICD-10-CM | POA: Diagnosis not present

## 2017-07-22 DIAGNOSIS — Z87891 Personal history of nicotine dependence: Secondary | ICD-10-CM | POA: Diagnosis not present

## 2017-07-22 DIAGNOSIS — R001 Bradycardia, unspecified: Secondary | ICD-10-CM | POA: Diagnosis not present

## 2017-07-22 DIAGNOSIS — R4182 Altered mental status, unspecified: Secondary | ICD-10-CM | POA: Diagnosis not present

## 2017-07-22 NOTE — Progress Notes (Signed)
Social Work Discharge Note  The overall goal for the admission was met for:   Discharge location: No - pt transferred to SNF instead of going home  Length of Stay: Yes - 22 days  Discharge activity level: No - pt is total assistance  Home/community participation: No - tx to SNF  Services provided included: MD, RD, PT, OT, SLP, RN, Pharmacy and [place]: Medicare and Private Insurance: [employer]  Follow-up services arranged: Other: Pt to tx to [facility] for SNF care via [place].  Comments (or additional information): Pt did not progress in CIR due to change in medical condition with MCA infarct following ICH.  Family to try SNF level of care for less intense rehabilitation program to see if pt progresses.  Palliative Care was involved while pt on CIR to assist with goals of care.  Family aware of options.  Pt with good family support.  Patient/Family verbalized understanding of follow-up arrangements: Yes  Individual responsible for coordination of the follow-up plan: pt's wife and son  Confirmed correct DME delivered: Trey Sailors 07/22/2017    Nicolena Schurman, Silvestre Mesi

## 2017-07-22 NOTE — Progress Notes (Signed)
Report called to [facility] at 1620 to [facility]. Patient more alert and interacting with family. Awaiting on transport at this time .  Randy Durham

## 2017-07-22 NOTE — Plan of Care (Signed)
Problem: RH BLADDER ELIMINATION Goal: RH STG MANAGE BLADDER WITH ASSISTANCE STG Manage Bladder With min Assistance   Outcome: Not Met (add Reason) Total assist   Problem: RH SKIN INTEGRITY Goal: RH STG MAINTAIN SKIN INTEGRITY WITH ASSISTANCE STG Maintain Skin Integrity With mod Assistance.   Outcome: Not Met (add Reason) Total assist   Problem: RH SAFETY Goal: RH STG DECREASED RISK OF FALL WITH ASSISTANCE STG Decreased Risk of Fall With mod Assistance.   Outcome: Not Met (add Reason) Max assist

## 2017-07-22 NOTE — Plan of Care (Signed)
Problem: RH SKIN INTEGRITY Goal: RH STG MAINTAIN SKIN INTEGRITY WITH ASSISTANCE STG Maintain Skin Integrity With mod Assistance.   Outcome: Not Met (add Reason) Patient requires total assist with managing skin integrity.  Problem: RH SAFETY Goal: RH STG ADHERE TO SAFETY PRECAUTIONS W/ASSISTANCE/DEVICE STG Adhere to Safety Precautions With min Assistance/Device.   Outcome: Not Met (add Reason) Total assist   Problem: RH KNOWLEDGE DEFICIT BRAIN INJURY Goal: RH STG INCREASE KNOWLEDGE OF DYSPHAGIA/FLUID INTAKE Outcome: Progressing Family verbalizes knowledge of dysphagia/fluid intake

## 2017-07-22 NOTE — Patient Care Conference (Signed)
Inpatient RehabilitationTeam Conference and Plan of Care Update Date: 07/21/2017   Time:  2:45 PM    Patient Name: Randy Durham      Medical Record Number: [id number]  Date of Birth: 1934-09-19 Sex: Male         Room/Bed: 4W15C/4W15C-01 Payor Info: Payor: MEDICARE / Plan: MEDICARE PART A AND B / Product Type: *No Product type* /    Admitting Diagnosis: Crani  Admit Date/Time:  06/30/2017  5:15 PM Admission Comments: No comment available   Primary Diagnosis:  Hemorrhage of right temporal lobe (HCC) Principal Problem: Hemorrhage of right temporal lobe [facility])  Patient Active Problem List   Diagnosis Date Noted  . Palliative care by specialist   . Vascular headache   . Lethargy   . AKI (acute kidney injury) (HCC)   . Benign essential HTN   . Somnolence   . Intracerebral hemorrhage 06/24/2017  . Hemorrhage of right temporal lobe (HCC) 06/24/2017  . Intraventricular hemorrhage (HCC) 06/24/2017  . Cytotoxic brain edema (HCC) 06/24/2017  . Paroxysmal atrial fibrillation (HCC) 03/30/2015  . Bradycardia 03/27/2015  . NSVT (nonsustained ventricular tachycardia) (HCC) 03/27/2015  . CAD- occluded distal LAD, 75-80% CFX- medical Rx 03/27/2015  . NSTEMI 03/25/15 03/25/2015  . Chronic kidney disease (CKD), stage III (moderate)   . Obesity (BMI 30-39.9)   . Spinal stenosis   . Hypertension   . Hyperlipidemia     Expected Discharge Date: Expected Discharge Date: 07/22/17  Team Members Present: Physician leading conference: Dr. Faith Rogue Social Worker Present: Staci Acosta, LCSW Nurse Present: Tennis Must, RN PT Present: Aleda Grana, PT OT Present: Callie Fielding, OT SLP Present: Feliberto Gottron, SLP PPS Coordinator present : Tora Duck, RN, CRRN     Current Status/Progress Goal Weekly Team Focus  Medical   no changes. have rx'ed UTI. requiring supplemental fluids.   stablize medically for transfer  see prior   Bowel/Bladder   Incontinent to bowel and bladder.  Control  incontinent episodes with max. assisst.  Monitor for bladder and bowel function Q shift.   Swallow/Nutrition/ Hydration   Full liquid, Total A  Max A  D/C tomorrow to SNF   ADL's   dependent overall  max A overall to reduce caregiver burden  alertness, family educatioin, reduce caregiver burden   Mobility   dependent assist overall  mod<>total assist  pt/family education, pt engagement in purposeful activities   Communication   Total   Max A  D/C tomorrow to SNF   Safety/Cognition/ Behavioral Observations  Total A   Max A  D/C tomorrow to SNF   Pain   Facial expresions of discomfort at times.On Tylenol 2 tab. schedule Q 6 hrs. and Robaxin Q 6 hrs. PRN.  To keep pain levels less than 2,on 1 to 10 scale.  To monitor for pain levels Q 2-3 hrs and PRN   Skin   Some blanchable redness on the buttom area,EPC in used.  To keep skin free of pressure sores with max. assist.  To monitor skin condition Q shift and PRN    Rehab Goals Patient on target to meet rehab goals: No Rehab Goals Revised: Pt's goals have been downgraded due to pt's medical condition.   *See Care Plan and progress notes for long and short-term goals.     Barriers to Discharge  Current Status/Progress Possible Resolutions Date Resolved   Physician    Medical stability        full time supervision, supplemental IVF,  Nursing                  PT                    OT                  SLP                SW                Discharge Planning/Teaching Needs:  Pt to transfer to SNF to see if he can progress at that level of care.    Pt's family is at bedside regularly and plan to do the same at the SNF.   Team Discussion:  Pt will need IVF at night at SNF per Dr. Riley Kill.  Pt's UTI was treated.  Pt will need further rehab at SNF level and if this is not successful, family will look into alternate plans for pt.  Pt to d/c to [facility] on 07-22-17.  Revisions to Treatment Plan:  none    Continued Need for  Acute Rehabilitation Level of Care: The patient requires daily medical management by a physician with specialized training in physical medicine and rehabilitation for the following conditions: Daily direction of a multidisciplinary physical rehabilitation program to ensure safe treatment while eliciting the highest outcome that is of practical value to the patient.: Yes Daily medical management of patient stability for increased activity during participation in an intensive rehabilitation regime.: Yes Daily analysis of laboratory values and/or radiology reports with any subsequent need for medication adjustment of medical intervention for : Neurological problems  Randy Durham, [place] 07/22/2017, 10:09 AM

## 2017-07-22 NOTE — Progress Notes (Signed)
Patient was discharged to [facility] via ambulance.

## 2017-07-22 NOTE — Progress Notes (Signed)
Speech Language Pathology Discharge Summary  Patient Details  Name: Randy Durham MRN: [id number] Date of Birth: Jul 14, 1934  Patient has met 2 of 4 long term goals.  Patient to discharge at overall Total;Max level.   Reasons goals not met: Patient had a functional decline    Clinical Impression/Discharge Summary: Patient has met 2 of 4 LTG's this admission due to swallowing safety. Currently, patient is consuming a full liquid diet without overt s/s of aspiration and requires Max-Total A for initiation with PO intake due to severe cognitive impairments. Patient did not meet any cognitive goals at this time 2/2 significant functional decline since initial evaluation.  Patient currently requires total assist for functional communication and all cognitive tasks. Pt has been limited by significant functional and cognitive decline and increased lethargy that limits participation. Family education is complete. Patient's family is unable to provide the necessary physical and cognitive assistance needed at this time, therefore, patient will discharge to a SNF. Patient would benefit from f/u SLP services to maximize functional communication, swallowing function and cognitive function in order to reduce caregiver burden.   Care Partner:  Caregiver Able to Provide Assistance: No  Type of Caregiver Assistance: Physical;Cognitive  Recommendation:  Skilled Nursing facility  Rationale for SLP Follow Up: Maximize cognitive function and independence;Maximize functional communication;Reduce caregiver burden;Maximize swallowing safety   Equipment: N/A   Reasons for discharge: Discharged from hospital   Patient/Family Agrees with Progress Made and Goals Achieved: Yes    Brown, [place] 07/22/2017, 7:21 AM

## 2017-07-22 NOTE — Progress Notes (Signed)
IV found at patient side upon rounding pressure dressing applied, IV consult requested

## 2017-07-22 NOTE — Progress Notes (Signed)
Patient very difficult to awaken this am. Patient allowed to sleep until 0945. Patient alert and eating less than 50 % of meal . Patient noted coughing after liquid medication given . Patient son at bedside giving patient ensure with some minimal coughing noted. Patient alert rubbing head saying words but not answering questions or following commands. Continue with attempts to give oral medications. D. Angiuilli, PA notified of coughing episode. No new orders. Continue with plan of care.  Alona Bene, Wynema Birch

## 2017-07-22 NOTE — Progress Notes (Addendum)
Social Work Patient ID: Randy Durham, male   DOB: 1933/12/01, 83 y.o.   MRN: [id number]   CSW met with pt and his wife 07-21-17 following team conference and after completing SNF search and they have accepted the bed offer at [facility].  CSW spoke with their admissions coordinator and bed is available today, 07-22-17 and pt will transfer later this afternoon.  Family is pleased with SNF and remain hopeful for pt's recovery.  Therapy team and MD are aware of pt's transfer to SNF.  CSW will prepare chart for pt's transfer and he will be transported via none emergent ambulance [facility] once room is ready at [facility].

## 2017-07-22 NOTE — Progress Notes (Signed)
[place] PHYSICAL MEDICINE & REHABILITATION     PROGRESS NOTE    Subjective/Complaints: No new issues. Actually alert/eyes open this am.   ROS: Limited due to cognitive/behavioral    Objective: Vital Signs: Blood pressure 140/90, pulse 90, temperature (!) 97.5 F (36.4 C), temperature source Oral, resp. rate 18, height  (1.6 m), weight 73.5 kg (162 lb), SpO2 99 %. No results found. No results for input(s): WBC, HGB, HCT, PLT in the last 72 hours.  Recent Labs  07/20/17 0840 07/21/17 1034  NA 138 137  K 4.1 3.8  CL 106 104  GLUCOSE 105* 143*  BUN 32* 32*  CREATININE 1.02 1.12  CALCIUM 9.5 9.4   CBG (last 3)  No results for input(s): GLUCAP in the last 72 hours.  Wt Readings from Last 3 Encounters:  07/21/17 73.5 kg (162 lb)  06/30/17 81.6 kg (180 lb)  03/25/17 82.1 kg (181 lb)    Physical Exam:  Constitutional: No distress. Vital signs reviewed HENT: Normocephalic.  Eyes: opens eyes intermittently to verbal cues Cardiovascular: RRR      Respiratory: normal effort GI: Bowel sounds are normal. He exhibits no distension. Skin: Skin is warm and dry. Craniotomy site clean and dry Neurological:   Eyes open, aphasic. Moves all 4's. Generalized reaction to sternal rub    Psych:  somnolent  Assessment/Plan: 1. Functional and cognitive deficits secondary to right temporal ICH which require 3+ hours per day of interdisciplinary therapy in a comprehensive inpatient rehab setting. Physiatrist is providing close team supervision and 24 hour management of active medical problems listed below. Physiatrist and rehab team continue to assess barriers to discharge/monitor patient progress toward functional and medical goals.  Function:  Bathing Bathing position   Position: Bed  Bathing parts   Body parts bathed by helper: Right arm, Left arm, Chest, Abdomen, Front perineal area, Buttocks, Right upper leg, Left upper leg, Right lower leg, Left lower leg, Back  Bathing  assist Assist Level:  (total A)      Upper Body Dressing/Undressing Upper body dressing   What is the patient wearing?: Pull over shirt/dress       Pull over shirt/dress - Perfomed by helper: Thread/unthread left sleeve, Put head through opening, Pull shirt over trunk, Thread/unthread right sleeve        Upper body assist Assist Level: 2 helpers      Lower Body Dressing/Undressing Lower body dressing   What is the patient wearing?: Pants, Non-skid slipper socks       Pants- Performed by helper: Thread/unthread right pants leg, Thread/unthread left pants leg, Pull pants up/down, Fasten/unfasten pants   Non-skid slipper socks- Performed by helper: Don/doff right sock, Don/doff left sock       Shoes - Performed by helper: Don/doff right shoe, Don/doff left shoe       TED Hose - Performed by helper: Don/doff right TED hose, Don/doff left TED hose  Lower body assist Assist for lower body dressing: 2 Helpers      Toileting Toileting Toileting activity did not occur: No continent bowel/bladder event   Toileting steps completed by helper: Adjust clothing prior to toileting, Performs perineal hygiene, Adjust clothing after toileting    Toileting assist Assist level: Two helpers   Transfers Chair/bed transfer   Chair/bed transfer method: Other Chair/bed transfer assist level: dependent (Pt equals 0%) Chair/bed transfer assistive device: Mechanical lift Mechanical lift: [place]   Locomotion Ambulation Ambulation activity did not occur: Safety/medical concerns (fatigue)   Max  distance: 20 ft Assist level: 2 helpers   Education administrator activity did not occur: Safety/medical concerns     Assist Level: Dependent (Pt equals 0%)  Cognition Comprehension Comprehension assist level: Understands basic less than 25% of the time/ requires cueing >75% of the time  Expression Expression assist level: Expresses basis less than 25% of the time/requires cueing >75% of the time.   Social Interaction Social Interaction assist level: Interacts appropriately less than 25% of the time. May be withdrawn or combative.  Problem Solving Problem solving assist level: Solves basic less than 25% of the time - needs direction nearly all the time or does not effectively solve problems and may need a restraint for safety  Memory Memory assist level: Recognizes or recalls less than 25% of the time/requires cueing greater than 75% of the time   Medical Problem List and Plan: 1. Decreased functional mobilitysecondary to right temporal ICH with IVH status post craniotomy for hematoma evacuation 06/24/2017 -appreciate Palliative care assistance.   -SNF placement today  -I can follow up with patient potentially in a month depending upon pt's status  3. Pain Management/back pain: Discontinue hydrocodone due to somnolence.   Monitor mental status  Topamax qhs for headache  4. Mood:dc amantadine---change to ritalin beginning at noon today 5. Neuropsych: This patient is notcapable of making decisions on hisown behalf. -continue sleep chart -limiting neuro-sedating medications.  -stimulate during day with ritalin. ?effect 6. Skin/Wound Care: Routine skin checks 7. Fluids/Electrolytes/Nutrition: encourage PO as possible    -full liquid diet. Family is pushing  -AKI,  -continue HS IVF at SNF    8.Seizure prophylaxis. Valproate 500 mg every 12 hours. EEG negative  -VPA level 55 on 8/29 9.CAD with cardiac catheterization 03/26/2015. Continue low-dose aspirin. 10.Hypertension/PAF. Lisinopril 5 mg daily, Lopressor 12.5 mg twice a day.   Norvasc 2.5 started on 9/1 to limit renal effects  Mild sys elevation otherwise ok Vitals:   07/21/17 2028 07/22/17 0507  BP: (!) 130/98 140/90  Pulse: 98 90  Resp:  18  Temp:  (!) 97.5 F (36.4 C)  SpO2: 99% 99%   11.Hyperlipidemia. Lipitor 12.Hypothyroidism. Synthroid. Follow up levels as outpt.  13.  UTI proteus: complete course of abx with amoxil---can stop today 14. Loose stool: continue imodium/probiotic with some benefit   -may be dietary too  LOS (Days) 22 A FACE TO FACE EVALUATION WAS PERFORMED  [name], MD 07/22/2017 9:12 AM

## 2017-07-22 NOTE — Progress Notes (Signed)
Patient more alert and awake and able to take po medications. Patient requires constant cueing to follow few simple commands. Awaiting discharge to [facility]. Family at bedside . Continue with plan of care.  Randy Durham

## 2017-07-22 NOTE — Progress Notes (Cosign Needed)
Social Work Patient ID: Randy Durham, male   DOB: [birth year], 83 y.o.   MRN: [id number]   Randy Durham, Darden Dates, LCSW Social Worker Signed   Patient Care Conference Date of Service: 07/22/2017 10:09 AM      Hide copied text Hover for attribution information Inpatient RehabilitationTeam Conference and Plan of Care Update Date: 07/21/2017   Time:  2:45 PM      Patient Name: Randy Durham      Medical Record Number: [id number]  Date of Birth: 12-08-1933 Sex: Male         Room/Bed: 4W15C/4W15C-01 Payor Info: Payor: MEDICARE / Plan: MEDICARE PART A AND B / Product Type: *No Product type* /     Admitting Diagnosis: Crani  Admit Date/Time:  06/30/2017  5:15 PM Admission Comments: No comment available    Primary Diagnosis:  Hemorrhage of right temporal lobe (HCC) Principal Problem: Hemorrhage of right temporal lobe [facility])       Patient Active Problem List    Diagnosis Date Noted  . Palliative care by specialist    . Vascular headache    . Lethargy    . AKI (acute kidney injury) (HCC)    . Benign essential HTN    . Somnolence    . Intracerebral hemorrhage 06/24/2017  . Hemorrhage of right temporal lobe (HCC) 06/24/2017  . Intraventricular hemorrhage (HCC) 06/24/2017  . Cytotoxic brain edema (HCC) 06/24/2017  . Paroxysmal atrial fibrillation (HCC) 03/30/2015  . Bradycardia 03/27/2015  . NSVT (nonsustained ventricular tachycardia) (HCC) 03/27/2015  . CAD- occluded distal LAD, 75-80% CFX- medical Rx 03/27/2015  . NSTEMI 03/25/15 03/25/2015  . Chronic kidney disease (CKD), stage III (moderate)    . Obesity (BMI 30-39.9)    . Spinal stenosis    . Hypertension    . Hyperlipidemia        Expected Discharge Date: Expected Discharge Date: 07/22/17   Team Members Present: Physician leading conference: Dr. Faith Rogue Social Worker Present: Staci Acosta, LCSW Nurse Present: Tennis Must, RN PT Present: Aleda Grana, PT OT Present: Callie Fielding, OT SLP Present: Feliberto Gottron,  SLP PPS Coordinator present : Tora Duck, RN, CRRN       Current Status/Progress Goal Weekly Team Focus  Medical     no changes. have rx'ed UTI. requiring supplemental fluids.   stablize medically for transfer  see prior   Bowel/Bladder     Incontinent to bowel and bladder.  Control incontinent episodes with max. assisst.  Monitor for bladder and bowel function Q shift.   Swallow/Nutrition/ Hydration     Full liquid, Total A  Max A  D/C tomorrow to SNF   ADL's     dependent overall  max A overall to reduce caregiver burden  alertness, family educatioin, reduce caregiver burden   Mobility     dependent assist overall  mod<>total assist  pt/family education, pt engagement in purposeful activities   Communication     Total   Max A  D/C tomorrow to SNF   Safety/Cognition/ Behavioral Observations   Total A   Max A  D/C tomorrow to SNF   Pain     Facial expresions of discomfort at times.On Tylenol 2 tab. schedule Q 6 hrs. and Robaxin Q 6 hrs. PRN.  To keep pain levels less than 2,on 1 to 10 scale.  To monitor for pain levels Q 2-3 hrs and PRN   Skin     Some blanchable redness on the buttom area,EPC in used.  To keep skin  free of pressure sores with max. assist.  To monitor skin condition Q shift and PRN     Rehab Goals Patient on target to meet rehab goals: No Rehab Goals Revised: Pt's goals have been downgraded due to pt's medical condition.   *See Care Plan and progress notes for long and short-term goals.      Barriers to Discharge   Current Status/Progress Possible Resolutions Date Resolved   Physician     Medical stability        full time supervision, supplemental IVF,      Nursing                 PT                    OT                 SLP            SW              Discharge Planning/Teaching Needs:  Pt to transfer to SNF to see if he can progress at that level of care.    Pt's family is at bedside regularly and plan to do the same at the SNF.   Team  Discussion:  Pt will need IVF at night at SNF per Dr. Riley Kill.  Pt's UTI was treated.  Pt will need further rehab at SNF level and if this is not successful, family will look into alternate plans for pt.  Pt to d/c to [facility] on 07-22-17.  Revisions to Treatment Plan:  none    Continued Need for Acute Rehabilitation Level of Care: The patient requires daily medical management by a physician with specialized training in physical medicine and rehabilitation for the following conditions: Daily direction of a multidisciplinary physical rehabilitation program to ensure safe treatment while eliciting the highest outcome that is of practical value to the patient.: Yes Daily medical management of patient stability for increased activity during participation in an intensive rehabilitation regime.: Yes Daily analysis of laboratory values and/or radiology reports with any subsequent need for medication adjustment of medical intervention for : Neurological problems   [name], [place] Deck 07/22/2017, 10:09 AM

## 2017-08-12 ENCOUNTER — Ambulatory Visit: Payer: Medicare Other | Admitting: Podiatry

## 2017-08-31 DIAGNOSIS — R634 Abnormal weight loss: Secondary | ICD-10-CM | POA: Diagnosis not present

## 2017-09-02 ENCOUNTER — Encounter
Payer: No Typology Code available for payment source | Attending: Physical Medicine & Rehabilitation | Admitting: Physical Medicine & Rehabilitation

## 2017-09-02 ENCOUNTER — Encounter: Payer: Self-pay | Admitting: Physical Medicine & Rehabilitation

## 2017-09-02 VITALS — BP 109/75 | HR 93

## 2017-09-02 DIAGNOSIS — E785 Hyperlipidemia, unspecified: Secondary | ICD-10-CM | POA: Insufficient documentation

## 2017-09-02 DIAGNOSIS — Z9889 Other specified postprocedural states: Secondary | ICD-10-CM | POA: Insufficient documentation

## 2017-09-02 DIAGNOSIS — M4800 Spinal stenosis, site unspecified: Secondary | ICD-10-CM | POA: Insufficient documentation

## 2017-09-02 DIAGNOSIS — I255 Ischemic cardiomyopathy: Secondary | ICD-10-CM

## 2017-09-02 DIAGNOSIS — R400 Somnolence: Secondary | ICD-10-CM | POA: Insufficient documentation

## 2017-09-02 DIAGNOSIS — I252 Old myocardial infarction: Secondary | ICD-10-CM | POA: Insufficient documentation

## 2017-09-02 DIAGNOSIS — E039 Hypothyroidism, unspecified: Secondary | ICD-10-CM | POA: Insufficient documentation

## 2017-09-02 DIAGNOSIS — I611 Nontraumatic intracerebral hemorrhage in hemisphere, cortical: Secondary | ICD-10-CM

## 2017-09-02 DIAGNOSIS — R4701 Aphasia: Secondary | ICD-10-CM | POA: Insufficient documentation

## 2017-09-02 DIAGNOSIS — I2510 Atherosclerotic heart disease of native coronary artery without angina pectoris: Secondary | ICD-10-CM | POA: Insufficient documentation

## 2017-09-02 DIAGNOSIS — I480 Paroxysmal atrial fibrillation: Secondary | ICD-10-CM | POA: Insufficient documentation

## 2017-09-02 DIAGNOSIS — N183 Chronic kidney disease, stage 3 (moderate): Secondary | ICD-10-CM | POA: Insufficient documentation

## 2017-09-02 DIAGNOSIS — R001 Bradycardia, unspecified: Secondary | ICD-10-CM | POA: Insufficient documentation

## 2017-09-02 DIAGNOSIS — Z809 Family history of malignant neoplasm, unspecified: Secondary | ICD-10-CM | POA: Insufficient documentation

## 2017-09-02 DIAGNOSIS — E669 Obesity, unspecified: Secondary | ICD-10-CM | POA: Insufficient documentation

## 2017-09-02 DIAGNOSIS — I129 Hypertensive chronic kidney disease with stage 1 through stage 4 chronic kidney disease, or unspecified chronic kidney disease: Secondary | ICD-10-CM | POA: Diagnosis not present

## 2017-09-02 DIAGNOSIS — Z87891 Personal history of nicotine dependence: Secondary | ICD-10-CM | POA: Insufficient documentation

## 2017-09-02 DIAGNOSIS — R5383 Other fatigue: Secondary | ICD-10-CM | POA: Insufficient documentation

## 2017-09-02 DIAGNOSIS — Z8249 Family history of ischemic heart disease and other diseases of the circulatory system: Secondary | ICD-10-CM | POA: Insufficient documentation

## 2017-09-02 DIAGNOSIS — G936 Cerebral edema: Secondary | ICD-10-CM | POA: Diagnosis not present

## 2017-09-02 DIAGNOSIS — Z8042 Family history of malignant neoplasm of prostate: Secondary | ICD-10-CM | POA: Insufficient documentation

## 2017-09-02 MED ORDER — APIXABAN 5 MG PO TABS: 5.0000 mg | ORAL_TABLET | Freq: Two times a day (BID) | ORAL | Status: AC

## 2017-09-02 NOTE — Patient Instructions (Signed)
PLEASE FEEL FREE TO CALL OUR OFFICE WITH ANY PROBLEMS OR QUESTIONS ([phone])      

## 2017-09-02 NOTE — Progress Notes (Signed)
Subjective:    Patient ID: Randy Durham, male    DOB: 1934-08-24, 83 y.o.   MRN: [id number]  HPI   Randy Durham is here in follow up of his stroke/SAH. He has been at a SNF and has made some modest gains. He has taken some steps with therapy. He is not eating much except for some bites and a couple cans of formula. He has lost 15 lbs since leaving CIR. They are exploring a PEG now and GI consult is pending. He remains essentially non-verbal and keeps his eyes closed the majority of the time  He was taken off of his eliquis for some reason at the SNF. Family is unsure why. He conitnues on VPA and topamax for seizure proph and headache    Pain Inventory Average Pain 0 Pain Right Now 0 My pain is na  In the last 24 hours, has pain interfered with the following? General activity 0 Relation with others 0 Enjoyment of life 0 What TIME of day is your pain at its worst? na Sleep (in general) Fair  Pain is worse with: na Pain improves with: na Relief from Meds: na  Mobility use a wheelchair needs help with transfers  Function retired I need assistance with the following:  feeding, dressing, bathing, toileting, meal prep, household duties and shopping  Neuro/Psych bladder control problems bowel control problems weakness trouble walking confusion  Prior Studies Any changes since last visit?  no  Physicians involved in your care Any changes since last visit?  no   Family History  Problem Relation Age of Onset  . Heart attack Father [id number]       cause of death  . Prostate cancer Father   . Cancer Sister   . Heart disease Sister    Social History   Social History  . Marital status: Married    Spouse name: N/A  . Number of children: N/A  . Years of education: N/A   Social History Main Topics  . Smoking status: Former Smoker    Types: Cigars, Pipe  . Smokeless tobacco: Never Used  . Alcohol use No  . Drug use: No  . Sexual activity: Not Asked   Other Topics  Concern  . None   Social History Narrative   Retired Psychologist, educationalexecutive for [employer]   Past Surgical History:  Procedure Laterality Date  . CARDIAC CATHETERIZATION N/A 03/26/2015   Procedure: Left Heart Cath and Coronary Angiography;  Surgeon: Runell GessJonathan J Berry, MD;  [facility];  Service: Cardiovascular;  Laterality: N/A;  . CRANIOTOMY Right 06/24/2017   Procedure: CRANIOTOMY HEMATOMA EVACUATION SUBDURAL;  Surgeon: Coletta Memosabbell, Kyle, MD;  Location: [facility] OR;  Service: Neurosurgery;  Laterality: Right;  . PILONIDAL CYST EXCISION    . TONSILLECTOMY     Past Medical History:  Diagnosis Date  . Anemia   . CAD (coronary artery disease)    Cath 03/26/2015 100% distal LAD stenosis, 80% ostial D1 stenosis, 75% ramus stenosis, 60% RPDA stenosis, 30% proximal RCA stenosis. Medical therapy, outpt myoview to assess LCx lesion  . Chronic kidney disease (CKD), stage III (moderate) (HCC)   . Hyperlipidemia   . Hypertension   . Hypothyroidism   . Obesity (BMI 30-39.9)   . Spinal stenosis   . Thyroid nodule    There were no vitals taken for this visit.  Opioid Risk Score:   Fall Risk Score:  `1  Depression screen PHQ 2/9  [facility] 07/25[facility]2015 04/23/2015  Decreased Interest 0 0  Down, Depressed, Hopeless 0 0  PHQ - 2 Score 0 0     Review of Systems  Constitutional: Positive for appetite change and unexpected weight change.  HENT: Negative.   Eyes: Negative.   Respiratory: Negative.   Cardiovascular: Negative.   Gastrointestinal: Positive for constipation and diarrhea.  Endocrine: Negative.   Genitourinary: Negative.   Musculoskeletal: Negative.   Skin: Negative.   Allergic/Immunologic: Negative.   Neurological: Negative.   Hematological: Negative.   Psychiatric/Behavioral: Negative.   All other systems reviewed and are negative.      Objective:   Physical Exam  General: sitting in w/c with head down HEENT: Head is normocephalic, atraumatic, PERRLA,  EOMI, sclera anicteric, oral mucosa pink and moist, dentition intact, ext ear canals clear,  Neck: Supple without JVD or lymphadenopathy Heart: Reg rate and rhythm. No murmurs rubs or gallops Chest: CTA bilaterally without wheezes, rales, or rhonchi; no distress Abdomen: Soft, non-tender, non-distended, bowel sounds positive. Extremities: No clubbing, cyanosis, or edema. Pulses are 2+ Skin: Clean and intact without signs of breakdown Neuro: is awake but keeps eyes closed. Moves all 4's. Non-verbal. Doesn't follow simple commands.   Musculoskeletal: Full ROM, No pain with AROM or PROM in the neck, trunk, or extremities. Posture appropriate Psych: Pt's affect withdrawn         Assessment & Plan:  1. Decreased functional mobilitysecondary to right temporal ICH with IVH status post craniotomy for hematoma evacuation 06/24/2017 3. Pain Management/back pain: h/a resolved  -dc topamax to see if it assists with arousal   4. Fluids/Electrolytes/Nutrition: --rec use of IVF at SNF until PEG placed  -family wishes to pursue PEG             8.Seizure prophylaxis. Valproate 500 mg every 12 hours. EEG negative           -VPA level 55 on 8/29--needs re-check 9.CAD with cardiac catheterization 03/26/2015. Continue low-dose aspirin as well. 10.PAF: eliquis recommended  11.Hyperlipidemia. Lipitor 12.Hypothyroidism. Synthroid. Follow up levels as well as at [facility]

## 2017-09-07 DIAGNOSIS — I619 Nontraumatic intracerebral hemorrhage, unspecified: Secondary | ICD-10-CM | POA: Diagnosis not present

## 2017-09-07 DIAGNOSIS — R638 Other symptoms and signs concerning food and fluid intake: Secondary | ICD-10-CM | POA: Diagnosis not present

## 2017-09-07 DIAGNOSIS — N183 Chronic kidney disease, stage 3 (moderate): Secondary | ICD-10-CM | POA: Diagnosis not present

## 2017-09-07 DIAGNOSIS — R1312 Dysphagia, oropharyngeal phase: Secondary | ICD-10-CM | POA: Diagnosis not present

## 2017-09-08 DIAGNOSIS — R630 Anorexia: Secondary | ICD-10-CM | POA: Diagnosis not present

## 2017-09-10 ENCOUNTER — Other Ambulatory Visit: Payer: Self-pay | Admitting: Gastroenterology

## 2017-09-10 ENCOUNTER — Other Ambulatory Visit (HOSPITAL_COMMUNITY): Payer: Self-pay | Admitting: Gastroenterology

## 2017-09-10 ENCOUNTER — Telehealth (HOSPITAL_COMMUNITY): Payer: Self-pay

## 2017-09-10 DIAGNOSIS — R627 Adult failure to thrive: Secondary | ICD-10-CM

## 2017-09-10 NOTE — Telephone Encounter (Signed)
Received request to schedule gtube placement. Referral was given to Dr. Loreta AveWagner for reviewed. Called and spoke to Dr. Marca AnconaKarki to inform her that per Dr. Corliss Skainseveshwar the pt would need a CT abdomen wo first before we could schedule procedure. Pt did not have any prior imaging in Epic. Dr. Marca AnconaKarki agreed to get a CT ordered. AW

## 2017-09-11 DIAGNOSIS — L03032 Cellulitis of left toe: Secondary | ICD-10-CM | POA: Diagnosis not present

## 2017-09-11 DIAGNOSIS — R633 Feeding difficulties: Secondary | ICD-10-CM | POA: Diagnosis not present

## 2017-09-11 DIAGNOSIS — W19XXXA Unspecified fall, initial encounter: Secondary | ICD-10-CM | POA: Diagnosis not present

## 2017-09-15 ENCOUNTER — Telehealth: Payer: Self-pay | Admitting: Physical Medicine & Rehabilitation

## 2017-09-15 ENCOUNTER — Other Ambulatory Visit: Payer: Self-pay | Admitting: Radiology

## 2017-09-15 NOTE — Telephone Encounter (Signed)
[facility] called and needs an order to hold Eliquis 48 hours -- must happen today or she must cancel procedure.    Her phone number is [phone].

## 2017-09-15 NOTE — Telephone Encounter (Signed)
Ok to hold Eliquis for 48 hours for Gastrostomy tube placement per vo Dr Riley KillSwartz.  [email or web address]  IR notified.

## 2017-09-18 ENCOUNTER — Ambulatory Visit (HOSPITAL_COMMUNITY)
Admission: RE | Admit: 2017-09-18 | Discharge: 2017-09-18 | Disposition: A | Discharge status: Home / Self Care | Payer: Medicare Other | Source: Ambulatory Visit | Attending: Gastroenterology | Admitting: Gastroenterology

## 2017-09-18 ENCOUNTER — Encounter (HOSPITAL_COMMUNITY): Payer: Self-pay

## 2017-09-18 DIAGNOSIS — E46 Unspecified protein-calorie malnutrition: Secondary | ICD-10-CM | POA: Insufficient documentation

## 2017-09-18 DIAGNOSIS — I129 Hypertensive chronic kidney disease with stage 1 through stage 4 chronic kidney disease, or unspecified chronic kidney disease: Secondary | ICD-10-CM | POA: Insufficient documentation

## 2017-09-18 DIAGNOSIS — I2582 Chronic total occlusion of coronary artery: Secondary | ICD-10-CM | POA: Insufficient documentation

## 2017-09-18 DIAGNOSIS — E669 Obesity, unspecified: Secondary | ICD-10-CM | POA: Insufficient documentation

## 2017-09-18 DIAGNOSIS — I2510 Atherosclerotic heart disease of native coronary artery without angina pectoris: Secondary | ICD-10-CM | POA: Diagnosis not present

## 2017-09-18 DIAGNOSIS — E039 Hypothyroidism, unspecified: Secondary | ICD-10-CM | POA: Insufficient documentation

## 2017-09-18 DIAGNOSIS — E785 Hyperlipidemia, unspecified: Secondary | ICD-10-CM | POA: Diagnosis not present

## 2017-09-18 DIAGNOSIS — E440 Moderate protein-calorie malnutrition: Secondary | ICD-10-CM | POA: Diagnosis not present

## 2017-09-18 DIAGNOSIS — Z7901 Long term (current) use of anticoagulants: Secondary | ICD-10-CM | POA: Insufficient documentation

## 2017-09-18 DIAGNOSIS — Z87891 Personal history of nicotine dependence: Secondary | ICD-10-CM | POA: Insufficient documentation

## 2017-09-18 DIAGNOSIS — N183 Chronic kidney disease, stage 3 (moderate): Secondary | ICD-10-CM | POA: Diagnosis not present

## 2017-09-18 DIAGNOSIS — I639 Cerebral infarction, unspecified: Secondary | ICD-10-CM | POA: Diagnosis not present

## 2017-09-18 DIAGNOSIS — R627 Adult failure to thrive: Secondary | ICD-10-CM

## 2017-09-18 DIAGNOSIS — Z6826 Body mass index (BMI) 26.0-26.9, adult: Secondary | ICD-10-CM | POA: Insufficient documentation

## 2017-09-18 DIAGNOSIS — Z8673 Personal history of transient ischemic attack (TIA), and cerebral infarction without residual deficits: Secondary | ICD-10-CM | POA: Insufficient documentation

## 2017-09-18 HISTORY — PX: IR GASTROSTOMY TUBE MOD SED: IMG625

## 2017-09-18 HISTORY — DX: Cerebral infarction, unspecified: I63.9

## 2017-09-18 HISTORY — DX: Cardiac arrhythmia, unspecified: I49.9

## 2017-09-18 LAB — PROTIME-INR
INR: 1.09
Prothrombin Time: 14 seconds (ref 11.4–15.2)

## 2017-09-18 LAB — CBC WITH DIFFERENTIAL/PLATELET
BASOS PCT: 1 %
Basophils Absolute: 0.1 10*3/uL (ref 0.0–0.1)
EOS ABS: 0.1 10*3/uL (ref 0.0–0.7)
Eosinophils Relative: 2 %
HCT: 49 % (ref 39.0–52.0)
HEMOGLOBIN: 16.4 g/dL (ref 13.0–17.0)
Lymphocytes Relative: 28 %
Lymphs Abs: 1.4 10*3/uL (ref 0.7–4.0)
MCH: 30 pg (ref 26.0–34.0)
MCHC: 33.5 g/dL (ref 30.0–36.0)
MCV: 89.6 fL (ref 78.0–100.0)
Monocytes Absolute: 0.4 10*3/uL (ref 0.1–1.0)
Monocytes Relative: 8 %
Neutro Abs: 3 10*3/uL (ref 1.7–7.7)
Neutrophils Relative %: 61 %
Platelets: 238 10*3/uL (ref 150–400)
RBC: 5.47 MIL/uL (ref 4.22–5.81)
RDW: 15.9 % — ABNORMAL HIGH (ref 11.5–15.5)
WBC: 5 10*3/uL (ref 4.0–10.5)

## 2017-09-18 LAB — COMPREHENSIVE METABOLIC PANEL
ALBUMIN: 3.7 g/dL (ref 3.5–5.0)
ALK PHOS: 85 U/L (ref 38–126)
ALT: 53 U/L (ref 17–63)
ANION GAP: 9 (ref 5–15)
AST: 60 U/L — AB (ref 15–41)
BUN: 20 mg/dL (ref 6–20)
CALCIUM: 9.5 mg/dL (ref 8.9–10.3)
CO2: 27 mmol/L (ref 22–32)
CREATININE: 1 mg/dL (ref 0.61–1.24)
Chloride: 106 mmol/L (ref 101–111)
GFR calc Af Amer: >60 mL/min (ref >60)
GFR calc non Af Amer: >60 mL/min (ref >60)
GLUCOSE: 92 mg/dL (ref 65–99)
Potassium: 3.7 mmol/L (ref 3.5–5.1)
SODIUM: 142 mmol/L (ref 135–145)
Total Bilirubin: 1.8 mg/dL — ABNORMAL HIGH (ref 0.3–1.2)
Total Protein: 7.2 g/dL (ref 6.5–8.1)

## 2017-09-18 MED ORDER — LIDOCAINE HCL 1 % IJ SOLN
INTRAMUSCULAR | Status: AC
  Filled 2017-09-18: qty 20

## 2017-09-18 MED ORDER — HYDROMORPHONE HCL 1 MG/ML IJ SOLN: 1.0000 mg | INTRAMUSCULAR | Status: DC | PRN

## 2017-09-18 MED ORDER — HYDROCODONE-ACETAMINOPHEN 5-325 MG PO TABS: 1.0000 | ORAL_TABLET | ORAL | Status: DC | PRN

## 2017-09-18 MED ORDER — MIDAZOLAM HCL 2 MG/2ML IJ SOLN
INTRAMUSCULAR | Status: AC
  Filled 2017-09-18: qty 2

## 2017-09-18 MED ORDER — SODIUM CHLORIDE 0.9 % IV SOLN
INTRAVENOUS | Status: DC
  Administered 2017-09-18: 11:00:00 via INTRAVENOUS

## 2017-09-18 MED ORDER — GLUCAGON HCL RDNA (DIAGNOSTIC) 1 MG IJ SOLR
INTRAMUSCULAR | Status: AC | PRN
  Administered 2017-09-18: .5 mg via INTRAVENOUS

## 2017-09-18 MED ORDER — IOPAMIDOL (ISOVUE-300) INJECTION 61%
INTRAVENOUS | Status: AC
  Filled 2017-09-18: qty 50

## 2017-09-18 MED ORDER — IOPAMIDOL (ISOVUE-300) INJECTION 61%
10.0000 mL | Freq: Once | INTRAVENOUS | Status: AC | PRN
  Administered 2017-09-18: 10 mL

## 2017-09-18 MED ORDER — GLUCAGON HCL RDNA (DIAGNOSTIC) 1 MG IJ SOLR
INTRAMUSCULAR | Status: AC
  Filled 2017-09-18: qty 1

## 2017-09-18 MED ORDER — NALOXONE HCL 0.4 MG/ML IJ SOLN
INTRAMUSCULAR | Status: AC
  Filled 2017-09-18: qty 1

## 2017-09-18 MED ORDER — FLUMAZENIL 0.5 MG/5ML IV SOLN
INTRAVENOUS | Status: AC
  Filled 2017-09-18: qty 5

## 2017-09-18 MED ORDER — CEFAZOLIN SODIUM-DEXTROSE 2-4 GM/100ML-% IV SOLN
INTRAVENOUS | Status: AC
  Administered 2017-09-18: 2 g via INTRAVENOUS
  Filled 2017-09-18: qty 100

## 2017-09-18 MED ORDER — FENTANYL CITRATE (PF) 100 MCG/2ML IJ SOLN
INTRAMUSCULAR | Status: AC
  Filled 2017-09-18: qty 2

## 2017-09-18 MED ORDER — LIDOCAINE HCL 1 % IJ SOLN
INTRAMUSCULAR | Status: AC | PRN
  Administered 2017-09-18: 10 mL

## 2017-09-18 MED ORDER — CEFAZOLIN SODIUM-DEXTROSE 2-4 GM/100ML-% IV SOLN
2.0000 g | INTRAVENOUS | Status: AC
  Administered 2017-09-18: 2 g via INTRAVENOUS

## 2017-09-18 MED ORDER — ONDANSETRON HCL 4 MG/2ML IJ SOLN: 4.0000 mg | INTRAMUSCULAR | Status: DC | PRN

## 2017-09-18 MED ORDER — MIDAZOLAM HCL 2 MG/2ML IJ SOLN
INTRAMUSCULAR | Status: AC | PRN
  Administered 2017-09-18: 2 mg via INTRAVENOUS
  Administered 2017-09-18: 0.5 mg via INTRAVENOUS

## 2017-09-18 MED ORDER — FENTANYL CITRATE (PF) 100 MCG/2ML IJ SOLN
INTRAMUSCULAR | Status: AC | PRN
  Administered 2017-09-18: 25 ug via INTRAVENOUS

## 2017-09-18 NOTE — Sedation Documentation (Signed)
Patient denies pain and is resting comfortably.  

## 2017-09-18 NOTE — Discharge Instructions (Signed)
Gastrostomy Tube Home Guide, Adult °A gastrostomy tube is a tube that is surgically placed into the stomach. It is also called a “G-tube.” G-tubes are used when a person is unable to eat and drink enough on their own to stay healthy. The tube is inserted into the stomach through a small cut (incision) in the skin. This tube is used for: °· Feeding. °· Giving medication. ° °Gastrostomy tube care °· Wash your hands with soap and water. °· Remove the old dressing (if any). Some styles of G-tubes may need a dressing inserted between the skin and the G-tube. Other types of G-tubes do not require a dressing. Ask your health care provider if a dressing is needed. °· Check the area where the tube enters the skin (insertion site) for redness, swelling, or pus-like (purulent) drainage. A small amount of clear or tan liquid drainage is normal. Check to make sure scar tissue (skin) is not growing around the insertion site. This could have a raised, bumpy appearance. °· A cotton swab can be used to clean the skin around the tube: °? When the G-tube is first put in, a normal saline solution or water can be used to clean the skin. °? Mild soap and warm water can be used when the skin around the G-tube site has healed. °? Roll the cotton swab around the G-tube insertion site to remove any drainage or crusting at the insertion site. °Stomach residuals °Feeding tube residuals are the amount of liquids that are in the stomach at any given time. Residuals may be checked before giving feedings, medications, or as instructed by your health care provider. °· Ask your health care provider if there are instances when you would not start tube feedings depending on the amount or type of contents withdrawn from the stomach. °· Check residuals by attaching a syringe to the G-tube and pulling back on the syringe plunger. Note the amount, and return the residual back into the stomach. ° °Flushing the G-tube °· The G-tube should be periodically  flushed with clean warm water to keep it from clogging. °? Flush the G-tube after feedings or medications. Draw up 30 mL of warm water in a syringe. Connect the syringe to the G-tube and slowly push the water into the tube. °? Do not push feedings, medications, or flushes rapidly. Flush the G-tube gently and slowly. °? Only use syringes made for G-tubes to flush medications or feedings. °? Your health care provider may want the G-tube flushed more often or with more water. If this is the case, follow your health care provider's instructions. °Feedings °Your health care provider will determine whether feedings are given as a bolus (a certain amount given at one time and at scheduled times) or whether feedings will be given continuously on a feeding pump. °· Formulas should be given at room temperature. °· If feedings are continuous, no more than 4 hours worth of feedings should be placed in the feeding bag. This helps prevent spoilage or accidental excess infusion. °· Cover and place unused formula in the refrigerator. °· If feedings are continuous, stop the feedings when medications or flushes are given. Be sure to restart the feedings. °· Feeding bags and syringes should be replaced as instructed by your health care provider. ° °Giving medication °· In general, it is best if all medications are in a liquid form for G-tube administration. Liquid medications are less likely to clog the G-tube. °? Mix the liquid medication with 30 mL (or amount recommended   by your health care provider) of warm water. °? Draw up the medication into the syringe. °? Attach the syringe to the G-tube and slowly push the mixture into the G-tube. °? After giving the medication, draw up 30 mL of warm water in the syringe and slowly flush the G-tube. °· For pills or capsules, check with your health care provider first before crushing medications. Some pills are not effective if they are crushed. Some capsules are sustained-release  medications. °? If appropriate, crush the pill or capsule and mix with 30 mL of warm water. Using the syringe, slowly push the medication through the tube, then flush the tube with another 30 mL of tap water. °G-tube problems °G-tube was pulled out. °· Cause: May have been pulled out accidentally. °· Solutions: Cover the opening with clean dressing and tape. Call your health care provider right away. The G-tube should be put in as soon as possible (within 4 hours) so the G-tube opening (tract) does not close. The G-tube needs to be put in at a health care setting. An X-ray needs to be done to confirm placement before the G-tube can be used again. ° °Redness, irritation, soreness, or foul odor around the gastrostomy site. °· Cause: May be caused by leakage or infection. °· Solutions: Call your health care provider right away. ° °Large amount of leakage of fluid or mucus-like liquid present (a large amount means it soaks clothing). °· Cause: Many reasons could cause the G-tube to leak. °· Solutions: Call your health care provider to discuss the amount of leakage. ° °Skin or scar tissue appears to be growing where tube enters skin. °· Cause: Tissue growth may develop around the insertion site if the G-tube is moved or pulled on excessively. °· Solutions: Secure tube with tape so that excess movement does not occur. Call your health care provider. ° °G-tube is clogged. °· Cause: Thick formula or medication. °· Solutions: Try to slowly push warm water into the tube with a large syringe. Never try to push any object into the tube to unclog it. Do not force fluid into the G-tube. If you are unable to unclog the tube, call your health care provider right away. ° °Tips °· Head of bed (HOB) position refers to the upright position of a person's upper body. °? When giving medications or a feeding bolus, keep the HOB up as told by your health care provider. Do this during the feeding and for 1 hour after the feeding or  medication administration. °? If continuous feedings are being given, it is best to keep the HOB up as told by your health care provider. When ADLs (activities of daily living) are performed and the HOB needs to be flat, be sure to turn the feeding pump off. Restart the feeding pump when the HOB is returned to the recommended height. °· Do not pull or put tension on the tube. °· To prevent fluid backflow, kink the G-tube before removing the cap or disconnecting a syringe. °· Check the G-tube length every day. Measure from the insertion site to the end of the G-tube. If the length is longer than previous measurements, the tube may be coming out. Call your health care provider if you notice increasing G-tube length. °· Oral care, such as brushing teeth, must be continued. °· You may need to remove excess air (vent) from the G-tube. Your health care provider will tell you if this is needed. °· Always call your health care provider if you have   questions or problems with the G-tube. °Get help right away if: °· You have severe abdominal pain, tenderness, or abdominal bloating (distension). °· You have nausea or vomiting. °· You are constipated or have problems moving your bowels. °· The G-tube insertion site is red, swollen, has a foul smell, or has yellow or brown drainage. °· You have difficulty breathing or shortness of breath. °· You have a fever. °· You have a large amount of feeding tube residuals. °· The G-tube is clogged and cannot be flushed. °This information is not intended to replace advice given to you by your health care provider. Make sure you discuss any questions you have with your health care provider. °Document Released: 12/29/2001 Document Revised: 03/27/2016 Document Reviewed: 06/27/2013 °Elsevier Interactive Patient Education © 2017 [employer] ° °Moderate Conscious Sedation, Adult, Care After °These instructions provide you with information about caring for yourself after your procedure. Your  health care provider may also give you more specific instructions. Your treatment has been planned according to current medical practices, but problems sometimes occur. Call your health care provider if you have any problems or questions after your procedure. °What can I expect after the procedure? °After your procedure, it is common: °· To feel sleepy for several hours. °· To feel clumsy and have poor balance for several hours. °· To have poor judgment for several hours. °· To vomit if you eat too soon. ° °Follow these instructions at home: °For at least 24 hours after the procedure: ° °· Do not: °? Participate in activities where you could fall or become injured. °? Drive. °? Use heavy machinery. °? Drink alcohol. °? Take sleeping pills or medicines that cause drowsiness. °? Make important decisions or sign legal documents. °? Take care of children on your own. °· Rest. °Eating and drinking °· Follow the diet recommended by your health care provider. °· If you vomit: °? Drink water, juice, or soup when you can drink without vomiting. °? Make sure you have little or no nausea before eating solid foods. °General instructions °· Have a responsible adult stay with you until you are awake and alert. °· Take over-the-counter and prescription medicines only as told by your health care provider. °· If you smoke, do not smoke without supervision. °· Keep all follow-up visits as told by your health care provider. This is important. °Contact a health care provider if: °· You keep feeling nauseous or you keep vomiting. °· You feel light-headed. °· You develop a rash. °· You have a fever. °Get help right away if: °· You have trouble breathing. °This information is not intended to replace advice given to you by your health care provider. Make sure you discuss any questions you have with your health care provider. °Document Released: 08/10/2013 Document Revised: 03/24/2016 Document Reviewed: 02/09/2016 °Elsevier Interactive  Patient Education © 2018 [employer] Inc. ° °

## 2017-09-18 NOTE — Procedures (Signed)
  Procedure: Perc gastrostomy tube placement 6176f Preprocedure diagnosis: CVA Postprocedure diagnosis: same EBL:   minimal Complications:  none immediate  See full dictation in [facility] P[facility].  Thora Lance. Kristion Holifield MD Main # [phone] Pager  [phone]

## 2017-09-18 NOTE — Progress Notes (Signed)
Abdominal Binder applied

## 2017-09-18 NOTE — H&P (Signed)
Chief Complaint: malnutrition  Referring Physician:Dr. Kerin SalenArya Karki  Supervising Physician: Oley BalmHassell, Daniel  [facility]  HPI: Randy Durham is a 83 y.o. male who has 2 subsequent CVAs 9 days apart this past August.  Before this the patient was walkie/talkie and functioned independently.  Since then the patient has progressively declined.  He is currently essentially nonverbal and does not really follow commands.  He is living at a SNF.  He does not eat or drink much.  Due to his malnutrition, a request has been made for a g-tube to be placed.  He is brought in today for this procedure.  Past Medical History:  Past Medical History:  Diagnosis Date  . Anemia   . CAD (coronary artery disease)    Cath 03/26/2015 100% distal LAD stenosis, 80% ostial D1 stenosis, 75% ramus stenosis, 60% RPDA stenosis, 30% proximal RCA stenosis. Medical therapy, outpt myoview to assess LCx lesion  . Chronic kidney disease (CKD), stage III (moderate) (HCC)   . Dysrhythmia    a fib  . Hyperlipidemia   . Hypertension   . Hypothyroidism   . Obesity (BMI 30-39.9)   . Spinal stenosis   . Stroke (HCC)    06/23/17  . Thyroid nodule     Past Surgical History:  Past Surgical History:  Procedure Laterality Date  . CRANIOTOMY HEMATOMA EVACUATION SUBDURAL Right 06/24/2017   Performed by Coletta Memosabbell, Kyle, [facility]  . Left Heart Cath and Coronary Angiography N/A 03/26/2015   Performed by Runell GessBerry, Jonathan J, MD at [facility]  . PILONIDAL CYST EXCISION    . TONSILLECTOMY      Family History:  Family History  Problem Relation Age of Onset  . Heart attack Father [id number]       cause of death  . Prostate cancer Father   . Cancer Sister   . Heart disease Sister     Social History:  reports that he has quit smoking. His smoking use included cigars and pipe. he has never used smokeless tobacco. He reports that he does not drink alcohol or use drugs.  Allergies: No Known  Allergies  Medications: Medications reviewed in epic.  Eliquis has been held for 48hrs  Unable to obtain ROS as patient is nonverbal  Mallampati Score: MD Evaluation Airway: WNL Heart: WNL Abdomen: WNL Chest/ Lungs: WNL ASA  Classification: 3 Mallampati/Airway Score: (won't open his mouth)  Physical Exam: BP 119/61   Pulse 64   Resp 16   Ht 5\' 3"  (1.6 m)   Wt 151 lb 8 oz (68.7 kg)   SpO2 100%   BMI 26.84 kg/m  Body mass index is 26.84 kg/m. General: pleasant, elderly male who is laying in bed in NAD HEENT: head is normocephalic, atraumatic.  Sclera are noninjected.  PERRL.  Ears and nose without any masses or lesions.  Patient will not open his mouth. Heart: regular, rate, and rhythm.  Normal s1,s2. No obvious murmurs, gallops, or rubs noted.  Palpable radial pulses bilaterally Lungs: CTAB, no wheezes, rhonchi, or rales noted.  Respiratory effort nonlabored Abd: soft, NT, ND, +BS, no masses, hernias, or organomegaly Psych: unable to assess as patient is nonverbal, but he does not follow commands   Labs: Results for orders placed or performed during the hospital encounter of 09/18/17 (from the past 48 hour(s))  CBC with Differential/Platelet     Status: Abnormal   Collection Time: 09/18/17 10:27 AM  Result Value Ref Range  WBC 5.0 4.0 - 10.5 K/uL   RBC 5.47 4.22 - 5.81 MIL/uL   Hemoglobin 16.4 13.0 - 17.0 g/dL   HCT 16.149.0 09.639.0 - 04.552.0 %   MCV 89.6 78.0 - 100.0 fL   MCH 30.0 26.0 - 34.0 pg   MCHC 33.5 30.0 - 36.0 g/dL   RDW 40.915.9 (H) 81.111.5 - 91.415.5 %   Platelets 238 150 - 400 K/uL   Neutrophils Relative % 61 %   Neutro Abs 3.0 1.7 - 7.7 K/uL   Lymphocytes Relative 28 %   Lymphs Abs 1.4 0.7 - 4.0 K/uL   Monocytes Relative 8 %   Monocytes Absolute 0.4 0.1 - 1.0 K/uL   Eosinophils Relative 2 %   Eosinophils Absolute 0.1 0.0 - 0.7 K/uL   Basophils Relative 1 %   Basophils Absolute 0.1 0.0 - 0.1 K/uL    Imaging: No results found.  Assessment/Plan 1.  Malnutrition  We will attempt g-tube placement.  Currently the patient will not open his mouth.  We will attempt to give him a little sedation medication to see if this will allow him to relax and open his mouth.  If he will not do this, then we will not be able to proceed.  The wife and son are aware of this and understand.  Risks and benefits discussed with the patient including, but not limited to the need for a barium enema during the procedure, bleeding, infection, peritonitis, or damage to adjacent structures. All of the patient's questions were answered, patient is agreeable to proceed. Consent signed and in chart.  Thank you for this interesting consult.  I greatly enjoyed meeting Randy Lemonrimo Herrada and look forward to participating in their care.  A copy of this report was sent to the requesting provider on this date.  Electronically Signed: Letha CapeKelly E Kaiyden Simkin 09/18/2017, 11:59 AM   I spent a total of  30 Minutes   in face to face in clinical consultation, greater than 50% of which was counseling/coordinating care for malnutrition

## 2017-09-21 DIAGNOSIS — L03032 Cellulitis of left toe: Secondary | ICD-10-CM | POA: Diagnosis not present

## 2017-09-21 DIAGNOSIS — I619 Nontraumatic intracerebral hemorrhage, unspecified: Secondary | ICD-10-CM | POA: Diagnosis not present

## 2017-09-21 DIAGNOSIS — N390 Urinary tract infection, site not specified: Secondary | ICD-10-CM | POA: Diagnosis not present

## 2017-09-21 DIAGNOSIS — Z9889 Other specified postprocedural states: Secondary | ICD-10-CM | POA: Diagnosis not present

## 2017-09-29 ENCOUNTER — Encounter (HOSPITAL_COMMUNITY): Payer: Self-pay

## 2017-09-29 ENCOUNTER — Ambulatory Visit (HOSPITAL_COMMUNITY): Admit: 2017-09-29 | Payer: Medicare Other | Admitting: Gastroenterology

## 2017-09-29 SURGERY — EGD (ESOPHAGOGASTRODUODENOSCOPY)
Anesthesia: Monitor Anesthesia Care

## 2017-09-30 DIAGNOSIS — L600 Ingrowing nail: Secondary | ICD-10-CM | POA: Diagnosis not present

## 2017-10-05 DIAGNOSIS — Z7189 Other specified counseling: Secondary | ICD-10-CM | POA: Diagnosis not present

## 2017-10-05 DIAGNOSIS — I959 Hypotension, unspecified: Secondary | ICD-10-CM | POA: Diagnosis not present

## 2017-10-05 DIAGNOSIS — B370 Candidal stomatitis: Secondary | ICD-10-CM | POA: Diagnosis not present

## 2017-10-05 DIAGNOSIS — L600 Ingrowing nail: Secondary | ICD-10-CM | POA: Diagnosis not present

## 2017-10-08 DIAGNOSIS — R531 Weakness: Secondary | ICD-10-CM | POA: Diagnosis not present

## 2017-10-09 DIAGNOSIS — L03119 Cellulitis of unspecified part of limb: Secondary | ICD-10-CM | POA: Diagnosis not present

## 2017-10-14 DIAGNOSIS — L03119 Cellulitis of unspecified part of limb: Secondary | ICD-10-CM | POA: Diagnosis not present

## 2017-10-14 DIAGNOSIS — K9429 Other complications of gastrostomy: Secondary | ICD-10-CM | POA: Diagnosis not present

## 2017-11-09 DIAGNOSIS — I480 Paroxysmal atrial fibrillation: Secondary | ICD-10-CM | POA: Diagnosis not present

## 2017-11-09 DIAGNOSIS — I611 Nontraumatic intracerebral hemorrhage in hemisphere, cortical: Secondary | ICD-10-CM | POA: Diagnosis not present

## 2017-11-09 DIAGNOSIS — R627 Adult failure to thrive: Secondary | ICD-10-CM | POA: Diagnosis not present

## 2017-11-09 DIAGNOSIS — N183 Chronic kidney disease, stage 3 (moderate): Secondary | ICD-10-CM | POA: Diagnosis not present

## 2017-11-09 DIAGNOSIS — E7800 Pure hypercholesterolemia, unspecified: Secondary | ICD-10-CM | POA: Diagnosis not present

## 2017-11-09 DIAGNOSIS — Z0000 Encounter for general adult medical examination without abnormal findings: Secondary | ICD-10-CM | POA: Diagnosis not present

## 2017-11-09 DIAGNOSIS — Z1389 Encounter for screening for other disorder: Secondary | ICD-10-CM | POA: Diagnosis not present

## 2017-11-09 DIAGNOSIS — I10 Essential (primary) hypertension: Secondary | ICD-10-CM | POA: Diagnosis not present

## 2017-11-09 DIAGNOSIS — E039 Hypothyroidism, unspecified: Secondary | ICD-10-CM | POA: Diagnosis not present

## 2017-11-25 DIAGNOSIS — R531 Weakness: Secondary | ICD-10-CM | POA: Diagnosis not present

## 2017-11-30 IMAGING — CT CT HEAD W/O CM
4 series · 16 of 47 positions shown, 18 images · non-contrast
Comparison: 06/28/2017

CLINICAL DATA: Followup intracranial hemorrhage.

EXAM:
CT HEAD WITHOUT CONTRAST
TECHNIQUE: Contiguous axial images were obtained from the base of the skull
through the vertex without intravenous contrast.

[Series 3: head without · axial · non-contrast · 0.42mm/px · z∈[-101,+19]mm · 7 of 33 slices shown, 9 images]
[im 5/33  brain]
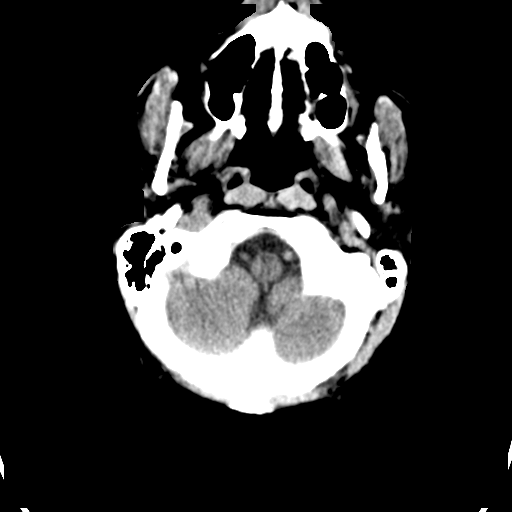
[im 5/33  bone]
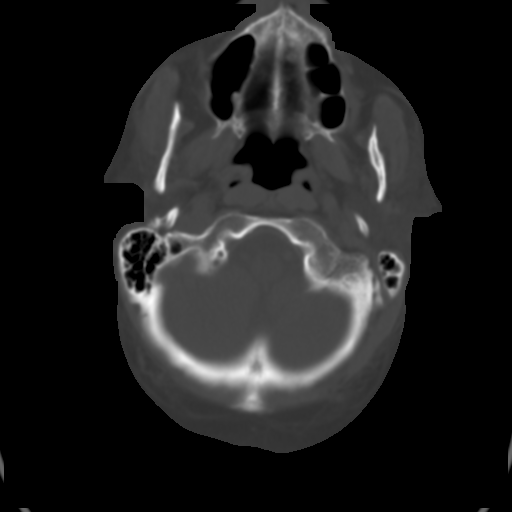
[im 9/33  brain]
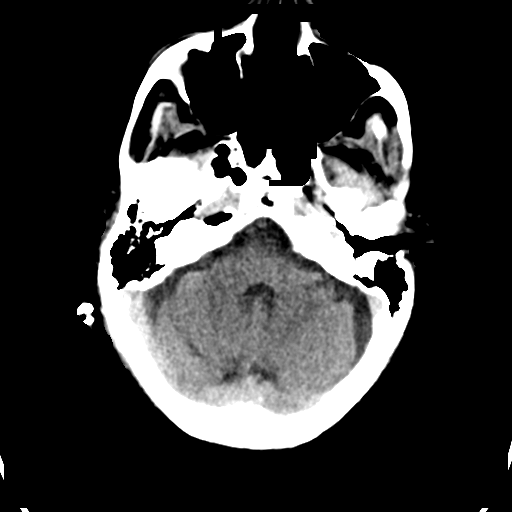
[im 13/33  brain]
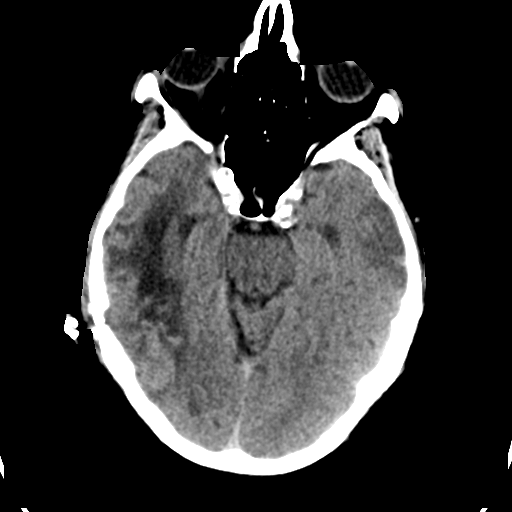
[im 17/33  brain]
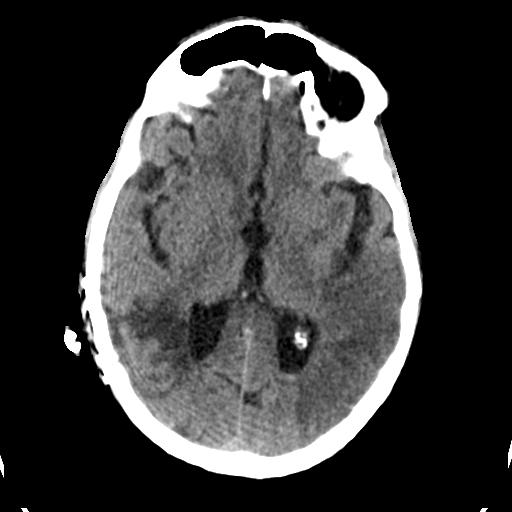
[im 21/33  brain]
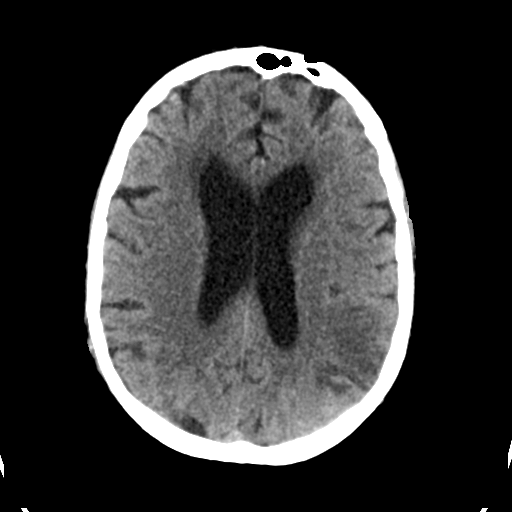
[im 21/33  bone]
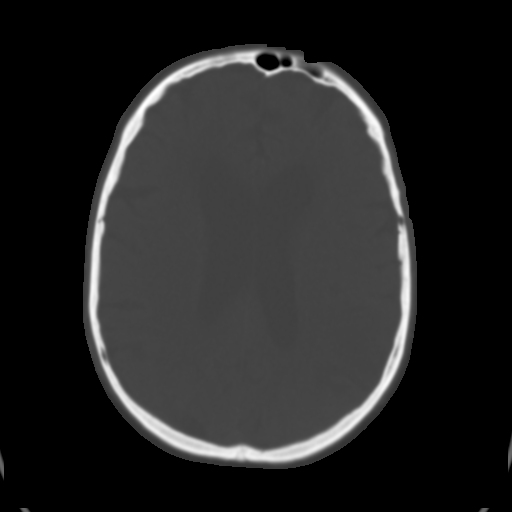
[im 25/33  brain]
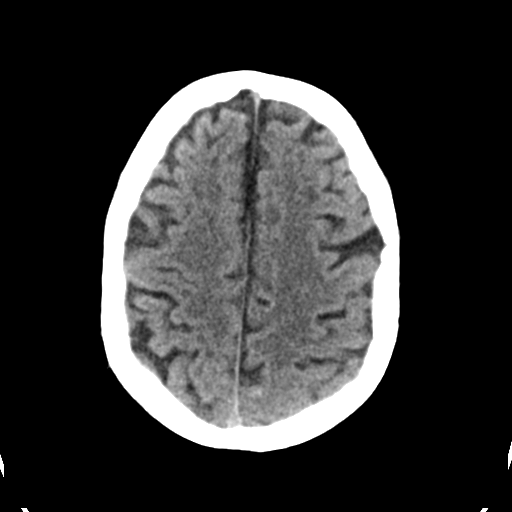
[im 29/33  brain]
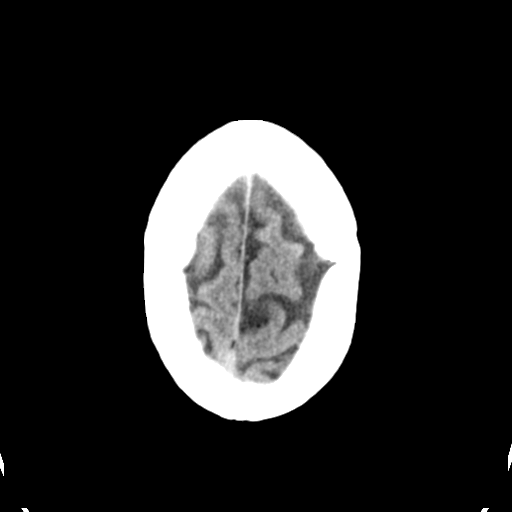

[Series 4: head bone · axial · 0.42mm/px · z∈[-105,-73]mm · 3 of 83 slices shown]
[im 9/83  bone]
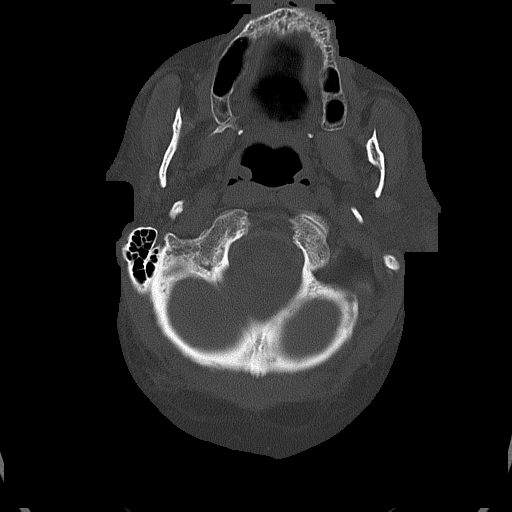
[im 17/83  bone]
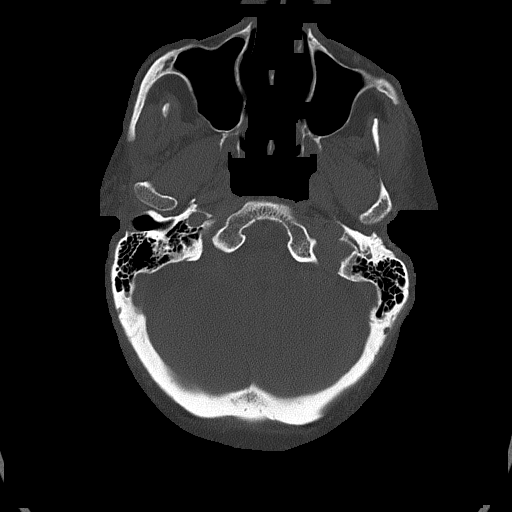
[im 25/83  bone]
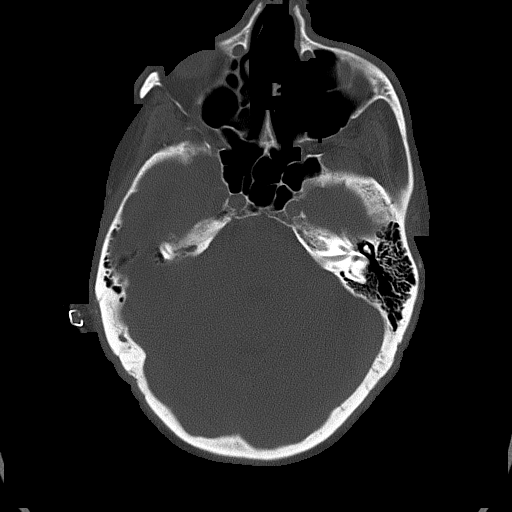

[Series 5: head without cor · coronal · non-contrast · 0.32mm/px · 3 of 70 slices shown]
[im 24/70  brain]
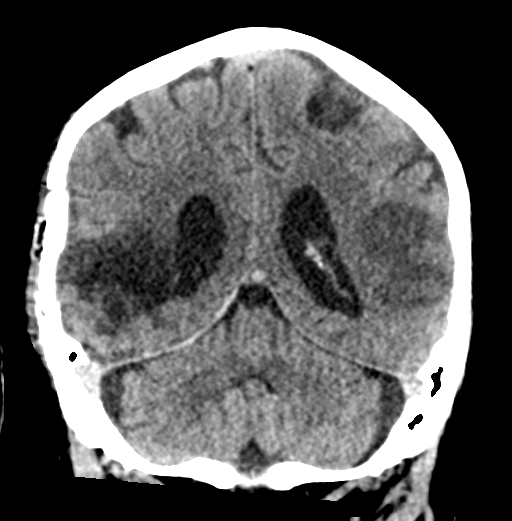
[im 31/70  brain]
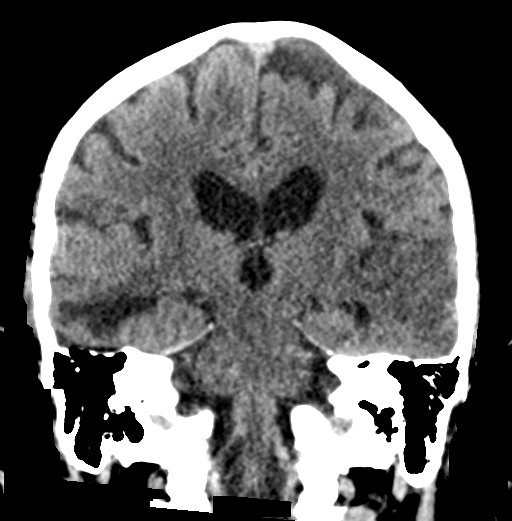
[im 39/70  brain]
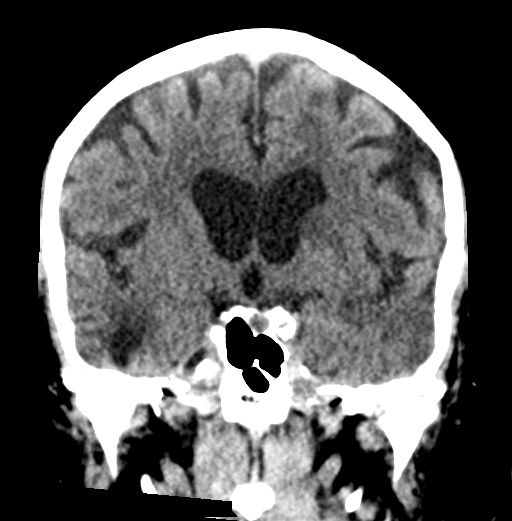

[Series 6: head without sag · sagittal · non-contrast · 0.32mm/px · 3 of 54 slices shown]
[im 18/54  brain]
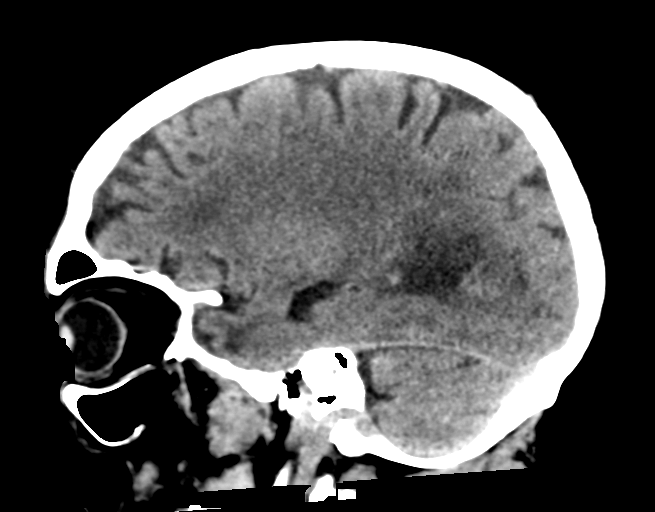
[im 27/54  brain]
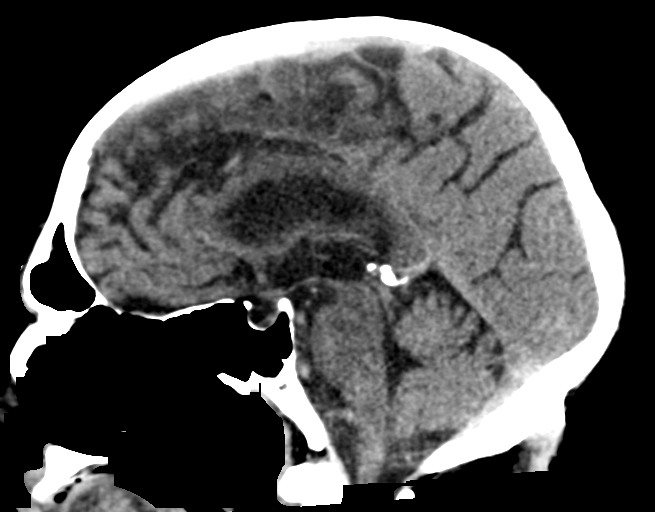
[im 36/54  brain]
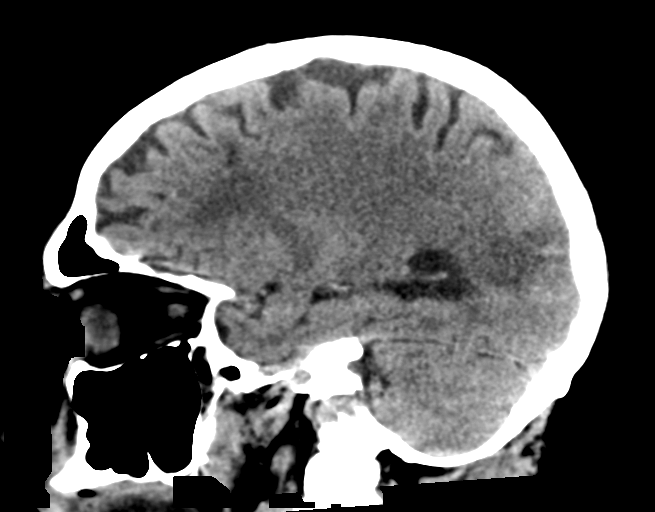

[16 of 47 positions shown; findings below may reference images not displayed]

FINDINGS: Brain: Cytotoxic edema in the inferior division left MCA territory,
affecting a large volume. This extends from the lateral temporal
lobe to the parietal cortex. There is also a band of infarct across
the basal ganglia and anterior limb internal capsule. There is small
volume subarachnoid hemorrhage, greatest along the posterior left
cerebral convexity. Small volume intraventricular hemorrhage.
Intracranial hemorrhage has diminished from prior. Less mass effect
at the posterior right cerebral hematoma evacuation site which is
predominately low-density. Incidental small low-density collection
along the bone flap.

Vascular: There is a background of cerebral atrophy and chronic
small vessel ischemia.

Skull: Unremarkable right parietal craniotomy site.

Sinuses/Orbits: Negative

Other: These results were called by telephone at the time of
interpretation on 07/07/2017 at [DATE] to Dr. AYDE BARRENO , who
verbally acknowledged these results.
IMPRESSION: 1. Large acute or early subacute infarct in the inferior division
left MCA territory and left basal ganglia.
2. Diminishing intracranial hemorrhage as described. Stable
ventricular volume.

## 2017-12-02 ENCOUNTER — Encounter: Payer: Medicare Other | Attending: Physical Medicine & Rehabilitation | Admitting: Physical Medicine & Rehabilitation

## 2017-12-02 ENCOUNTER — Encounter: Payer: Self-pay | Admitting: Physical Medicine & Rehabilitation

## 2017-12-02 DIAGNOSIS — R400 Somnolence: Secondary | ICD-10-CM | POA: Insufficient documentation

## 2017-12-02 DIAGNOSIS — I2510 Atherosclerotic heart disease of native coronary artery without angina pectoris: Secondary | ICD-10-CM | POA: Diagnosis not present

## 2017-12-02 DIAGNOSIS — I129 Hypertensive chronic kidney disease with stage 1 through stage 4 chronic kidney disease, or unspecified chronic kidney disease: Secondary | ICD-10-CM | POA: Diagnosis not present

## 2017-12-02 DIAGNOSIS — R5383 Other fatigue: Secondary | ICD-10-CM | POA: Diagnosis not present

## 2017-12-02 DIAGNOSIS — E039 Hypothyroidism, unspecified: Secondary | ICD-10-CM | POA: Insufficient documentation

## 2017-12-02 DIAGNOSIS — G936 Cerebral edema: Secondary | ICD-10-CM | POA: Insufficient documentation

## 2017-12-02 DIAGNOSIS — Z87891 Personal history of nicotine dependence: Secondary | ICD-10-CM | POA: Insufficient documentation

## 2017-12-02 DIAGNOSIS — R001 Bradycardia, unspecified: Secondary | ICD-10-CM | POA: Insufficient documentation

## 2017-12-02 DIAGNOSIS — I615 Nontraumatic intracerebral hemorrhage, intraventricular: Secondary | ICD-10-CM | POA: Diagnosis not present

## 2017-12-02 DIAGNOSIS — Z9889 Other specified postprocedural states: Secondary | ICD-10-CM | POA: Diagnosis not present

## 2017-12-02 DIAGNOSIS — E785 Hyperlipidemia, unspecified: Secondary | ICD-10-CM | POA: Insufficient documentation

## 2017-12-02 DIAGNOSIS — Z8042 Family history of malignant neoplasm of prostate: Secondary | ICD-10-CM | POA: Diagnosis not present

## 2017-12-02 DIAGNOSIS — I480 Paroxysmal atrial fibrillation: Secondary | ICD-10-CM | POA: Insufficient documentation

## 2017-12-02 DIAGNOSIS — I252 Old myocardial infarction: Secondary | ICD-10-CM | POA: Insufficient documentation

## 2017-12-02 DIAGNOSIS — R4701 Aphasia: Secondary | ICD-10-CM | POA: Diagnosis not present

## 2017-12-02 DIAGNOSIS — E669 Obesity, unspecified: Secondary | ICD-10-CM | POA: Diagnosis not present

## 2017-12-02 DIAGNOSIS — I611 Nontraumatic intracerebral hemorrhage in hemisphere, cortical: Secondary | ICD-10-CM | POA: Diagnosis not present

## 2017-12-02 DIAGNOSIS — Z809 Family history of malignant neoplasm, unspecified: Secondary | ICD-10-CM | POA: Diagnosis not present

## 2017-12-02 DIAGNOSIS — M4800 Spinal stenosis, site unspecified: Secondary | ICD-10-CM | POA: Diagnosis not present

## 2017-12-02 DIAGNOSIS — Z8249 Family history of ischemic heart disease and other diseases of the circulatory system: Secondary | ICD-10-CM | POA: Diagnosis not present

## 2017-12-02 DIAGNOSIS — N183 Chronic kidney disease, stage 3 (moderate): Secondary | ICD-10-CM | POA: Diagnosis not present

## 2017-12-02 NOTE — Patient Instructions (Signed)
CONSIDER ELEMENTAL FORMULA  FIBER/PRO BIOTIC   RESUME BP MEDS AS NEEDED.     PLEASE FEEL FREE TO CALL OUR OFFICE WITH ANY PROBLEMS OR QUESTIONS [phone])

## 2017-12-02 NOTE — Progress Notes (Signed)
Subjective:    Patient ID: Randy Durham, male    DOB: Jan 20, 1934, 84 y.o.   MRN: [id number]  HPI   Mr. Utz is here in follow up of his ICH. He is still at [facility] but plan is to move to apt soon (1st floor). Arousal remains intermittent. He is quite awake today but has days where he's very somnolent. Sleep has been fair. He is still not eating much and is fed almost exclusively thru the tube.   He is still having loose stool with his formula. He is on jevity currently. They are using imodium to help with loose stool.   From a language standpoint he is speaking generally incomprehensible words.  He does respond appropriately on occasion.  Family has not seen a lot of change in his language over the last few months.  He does appear to be in better spirits per his son.  Son had questions regarding end-of-life considerations today.   Pain Inventory Average Pain 0 Pain Right Now 0 My pain is no pain  In the last 24 hours, has pain interfered with the following? General activity 0 Relation with others 0 Enjoyment of life 0 What TIME of day is your pain at its worst? no pain Sleep (in general) Good  Pain is worse with: no pain Pain improves with: no pain Relief from Meds: no pain  Mobility use a wheelchair needs help with transfers  Function retired I need assistance with the following:  feeding, dressing, bathing, toileting, meal prep, household duties and shopping  Neuro/Psych bladder control problems bowel control problems trouble walking confusion  Prior Studies Any changes since last visit?  no  Physicians involved in your care Any changes since last visit?  no   Family History  Problem Relation Age of Onset  . Heart attack Father 70       cause of death  . Prostate cancer Father   . Cancer Sister   . Heart disease Sister    Social History   Socioeconomic History  . Marital status: Married    Spouse name: None  . Number of children: None  . Years of  education: None  . Highest education level: None  Social Needs  . Financial resource strain: None  . Food insecurity - worry: None  . Food insecurity - inability: None  . Transportation needs - medical: None  . Transportation needs - non-medical: None  Occupational History  . None  Tobacco Use  . Smoking status: Former Smoker    Types: Cigars, Pipe  . Smokeless tobacco: Never Used  Substance and Sexual Activity  . Alcohol use: No  . Drug use: No  . Sexual activity: None  Other Topics Concern  . None  Social History Narrative   Retired Psychologist, educational for [employer]   Past Surgical History:  Procedure Laterality Date  . CARDIAC CATHETERIZATION N/A 03/26/2015   Procedure: Left Heart Cath and Coronary Angiography;  Surgeon: Runell Gess, MD;  [facility] INVASIVE CV LAB;  Service: Cardiovascular;  Laterality: N/A;  . CRANIOTOMY Right 06/24/2017   Procedure: CRANIOTOMY HEMATOMA EVACUATION SUBDURAL;  Surgeon: Coletta Memos, MD;  Location: [facility] OR;  Service: Neurosurgery;  Laterality: Right;  . IR GASTROSTOMY TUBE MOD SED  09/18/2017  . PILONIDAL CYST EXCISION    . TONSILLECTOMY     Past Medical History:  Diagnosis Date  . Anemia   . CAD (coronary artery disease)    Cath 03/26/2015 100% distal LAD stenosis, 80% ostial  D1 stenosis, 75% ramus stenosis, 60% RPDA stenosis, 30% proximal RCA stenosis. Medical therapy, outpt myoview to assess LCx lesion  . Chronic kidney disease (CKD), stage III (moderate) (HCC)   . Dysrhythmia    a fib  . Hyperlipidemia   . Hypertension   . Hypothyroidism   . Obesity (BMI 30-39.9)   . Spinal stenosis   . Stroke (HCC)    06/23/17  . Thyroid nodule    There were no vitals taken for this visit.  Opioid Risk Score:   Fall Risk Score:  `1  Depression screen PHQ 2/9  [facility] 07/25/2015 04/23/2015  Decreased Interest 0 0  Down, Depressed, Hopeless 0 0  PHQ - 2 Score 0 0    Review of Systems  HENT: Negative.   Eyes:  Negative.   Respiratory: Negative.   Cardiovascular: Negative.   Gastrointestinal:       Bowel control problems  Endocrine: Negative.   Genitourinary: Positive for difficulty urinating.       Bladder control problems  Skin: Negative.   Allergic/Immunologic: Negative.   Hematological: Negative.   Psychiatric/Behavioral: Positive for confusion.  All other systems reviewed and are negative.      Objective:   Physical Exam  General:  Sitting in wheelchair and appears comfortable HEENT:  PERRLA Neck: Supple without JVD or lymphadenopathy Heart: RRR Chest: CTA B Abdomen:  PEG in place. Extremities: No clubbing, cyanosis, or edema. Pulses are 2+ Skin: Clean and intact without signs of breakdown Neuro:  Very alert and makes eye contact.  Generally speech is gibberish.  I did ask him to shake my hand and he replied yes and gave me his right hand to shake.  Moves all 4 limbs spontaneously   Musculoskeletal: Full ROM, No pain with AROM or PROM in the neck, trunk, or extremities. Posture appropriate Psych:  He is smiling and more upbeat         Assessment & Plan:  1. Decreased functional mobilitysecondary to right temporal ICH with IVH status post craniotomy for hematoma evacuation 06/24/2017  -Discussed with son at length regarding when it would be appropriate to involve palliative care or hospice in this situation.  A lot of that is up to them and what they think the patient would want as well.  The likelihood that his language and cognition will improve much more from where he is at today is small. 3. Pain Management/back pain: h/a resolved           -tylenol   4. Fluids/Electrolytes/Nutrition: -            -Remains reliance on PEG    -Suggested use of elemental formula to help with loose stool.  Also would maximize fiber and probiotics 5. HTN: off meds currently. resume as needed.  Reminded him that metoprolol and lisinopril are also being used for his CAD and atrial  fibrillation   -Somewhat discussed with primary at next visit 8.Seizure prophylaxis. Valproate 500 mg every 12 hours. EEG negative -no new seizures 9.CAD with cardiac catheterization 03/26/2015. Continue low-dose aspirin as well. 10.PAF: eliquis recommended  11.Hyperlipidemia. Lipitor 12.Hypothyroidism. Synthroid.   Follow up with me in 6 months.  15 minutes of direct patient time spent today.Greater than 50% of time during this encounter was spent counseling patient/family in regard to feeding issues, diarrhea, end of life considerations etc..

## 2017-12-17 DIAGNOSIS — M79674 Pain in right toe(s): Secondary | ICD-10-CM | POA: Diagnosis not present

## 2017-12-22 DIAGNOSIS — I69191 Dysphagia following nontraumatic intracerebral hemorrhage: Secondary | ICD-10-CM | POA: Diagnosis not present

## 2017-12-22 DIAGNOSIS — Z9889 Other specified postprocedural states: Secondary | ICD-10-CM | POA: Diagnosis not present

## 2017-12-22 DIAGNOSIS — I619 Nontraumatic intracerebral hemorrhage, unspecified: Secondary | ICD-10-CM | POA: Diagnosis not present

## 2017-12-22 DIAGNOSIS — I639 Cerebral infarction, unspecified: Secondary | ICD-10-CM | POA: Diagnosis not present

## 2018-02-11 IMAGING — XA IR PERC PLACEMENT GASTROSTOMY
2 series · 10 of 10 positions shown · non-contrast
Comparison: none

CLINICAL DATA: Previous stroke, needs enteral feeding support.

EXAM:
PERC PLACEMENT GASTROSTOMY
FLUOROSCOPY TIME:  1 minutes 42 seconds, 330 u9ymM DAP
TECHNIQUE: The procedure, risks, benefits, and alternatives were explained to
the patient and family. Questions regarding the procedure were
encouraged and answered. The family understands and consents to the
procedure.

[Series 3: fl - angio · 4 of 44 frames shown]
[frame 1/44]
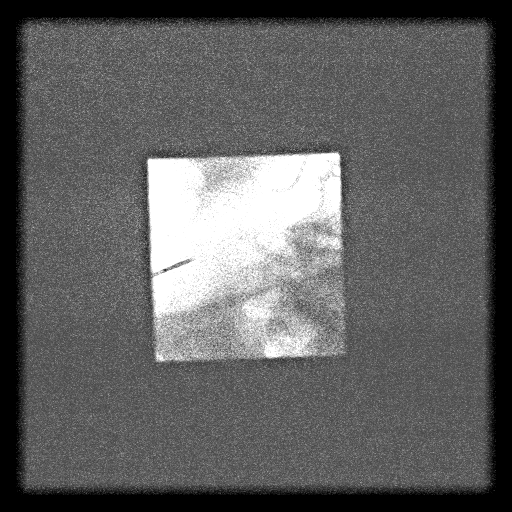
[frame 7/44]
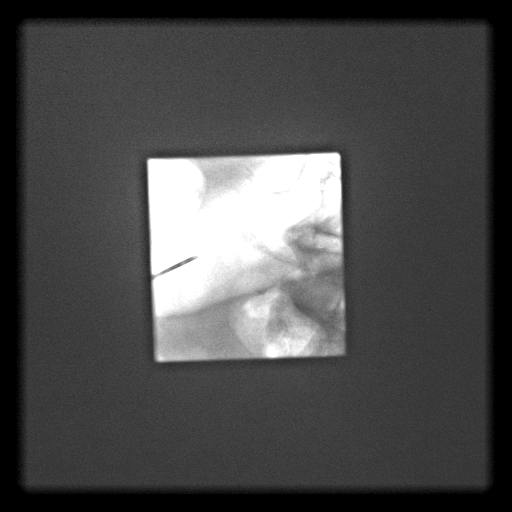
[frame 23/44]
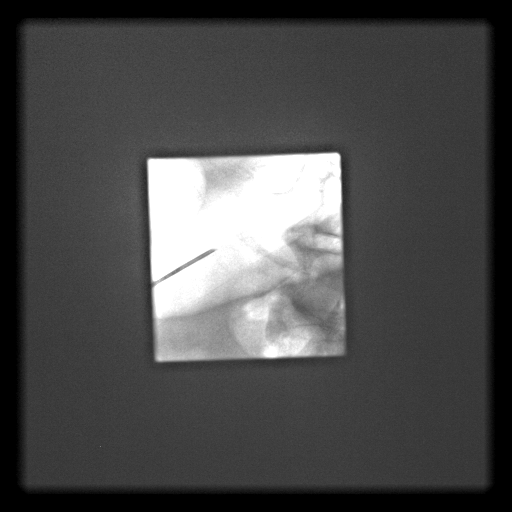
[frame 38/44]
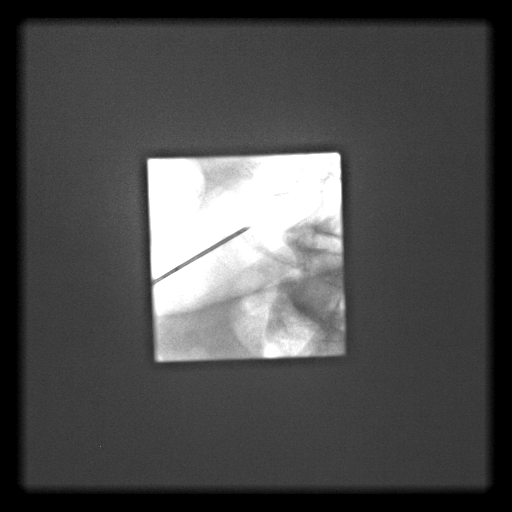

[Series 300: tube placements · 6 of 6 slices shown]
[im 1/6]
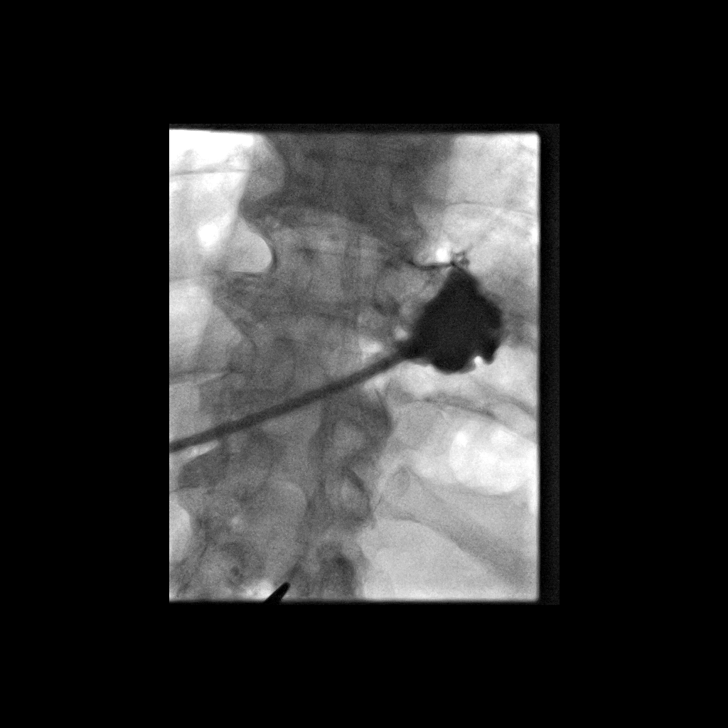
[im 2/6]
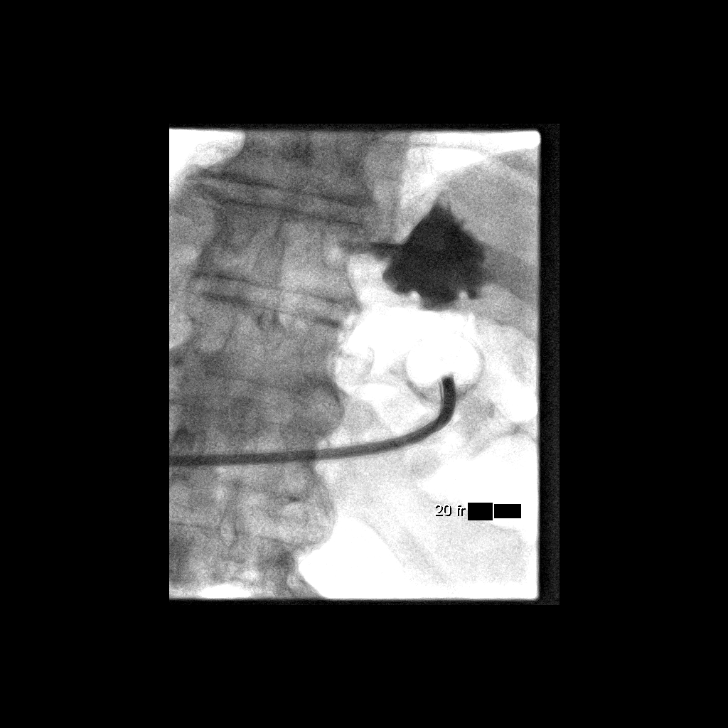
[im 3/6]
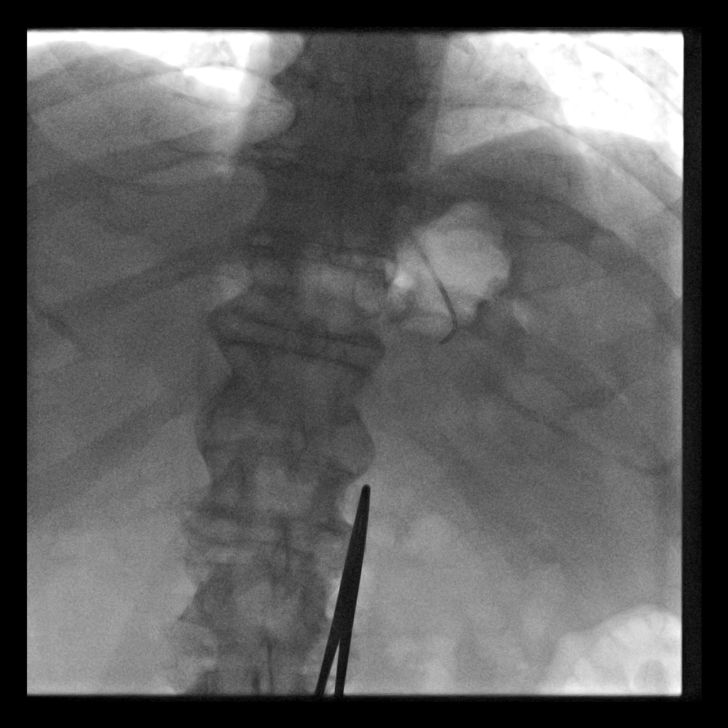
[im 4/6]
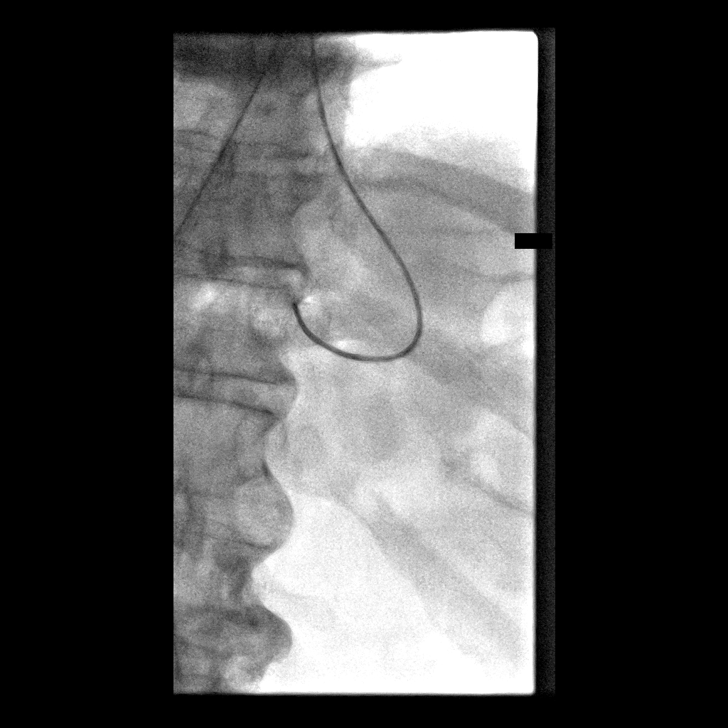
[im 5/6]
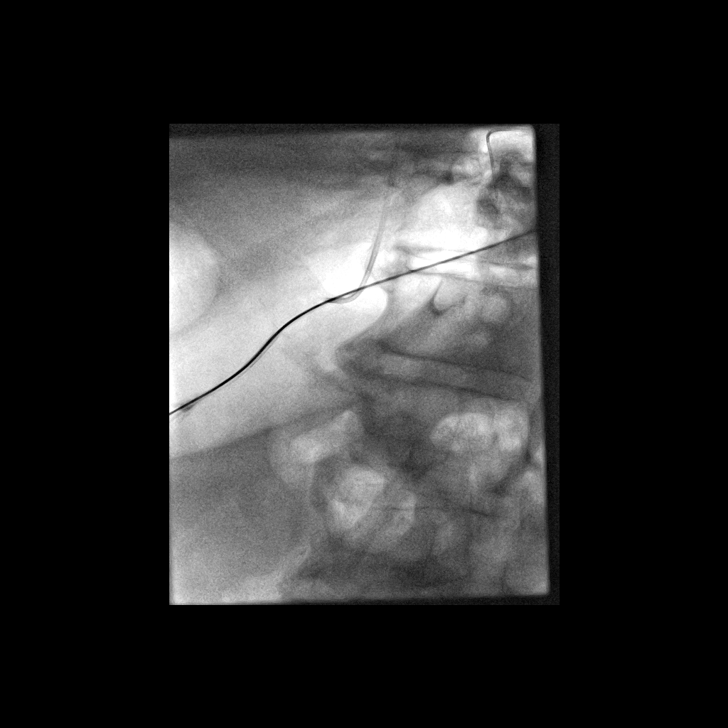
[im 6/6]
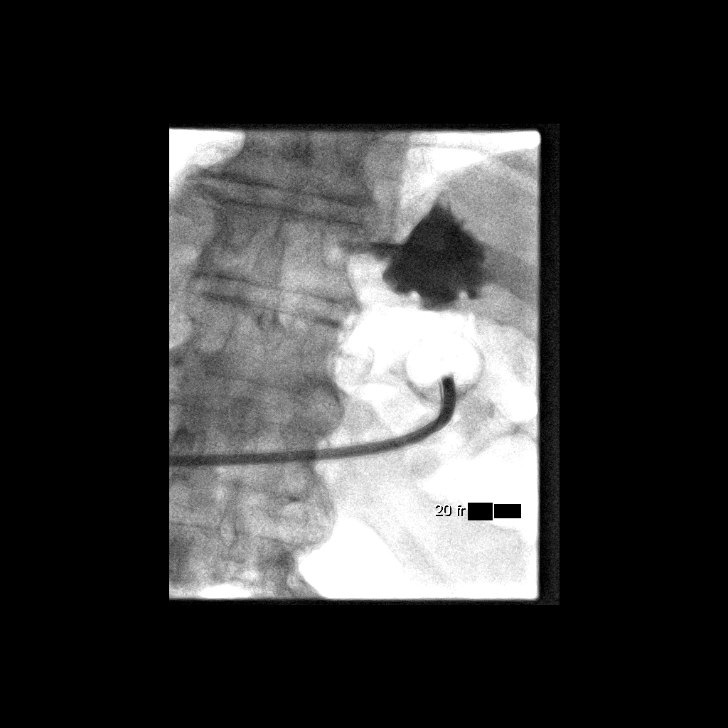

[10 of 10 positions shown; findings below may reference images not displayed]

As antibiotic prophylaxis, cefazolin 2 g was ordered pre-procedure
and administered intravenously within one hour of incision.

A 5 French angiographic catheter was placed as orogastric tube. The
upper abdomen was prepped with Betadine, draped in usual sterile
fashion, and infiltrated locally with 1% lidocaine.

Intravenous Fentanyl and Versed were administered as conscious
sedation during continuous monitoring of the patient's level of
consciousness and physiological / cardiorespiratory status by the
radiology RN, with a total moderate sedation time of 13 minutes.
mg glucagon given IV to facilitate gastric distention.

Stomach was insufflated using air through the orogastric tube. An 18
French sheath needle was advanced percutaneously into the gastric
lumen under fluoroscopy. Gas could be aspirated and a small contrast
injection confirmed intraluminal spread. The sheath was exchanged
over a guidewire for a 9 French vascular sheath, through which the
snare device was advanced and used to snare a guidewire passed
through the orogastric tube. This was withdrawn, and the snare
attached to the 20 French pull-through gastrostomy tube, which was
advanced antegrade, positioned with the internal bumper securing the
anterior gastric wall to the anterior abdominal wall. Small contrast
injection confirms appropriate positioning. The external bumper was
applied and the catheter was flushed.

COMPLICATIONS:
COMPLICATIONS
none
IMPRESSION: 1. Technically successful 20 French pull-through gastrostomy
placement under fluoroscopy.

## 2018-02-18 DIAGNOSIS — I480 Paroxysmal atrial fibrillation: Secondary | ICD-10-CM | POA: Diagnosis not present

## 2018-02-18 DIAGNOSIS — I69191 Dysphagia following nontraumatic intracerebral hemorrhage: Secondary | ICD-10-CM | POA: Diagnosis not present

## 2018-02-18 DIAGNOSIS — E039 Hypothyroidism, unspecified: Secondary | ICD-10-CM | POA: Diagnosis not present

## 2018-02-19 DIAGNOSIS — L03031 Cellulitis of right toe: Secondary | ICD-10-CM | POA: Diagnosis not present

## 2018-02-19 DIAGNOSIS — B351 Tinea unguium: Secondary | ICD-10-CM | POA: Diagnosis not present

## 2018-02-19 DIAGNOSIS — R262 Difficulty in walking, not elsewhere classified: Secondary | ICD-10-CM | POA: Diagnosis not present

## 2018-02-19 DIAGNOSIS — I739 Peripheral vascular disease, unspecified: Secondary | ICD-10-CM | POA: Diagnosis not present

## 2018-02-22 ENCOUNTER — Ambulatory Visit: Payer: Medicare Other | Admitting: Podiatry

## 2018-03-01 DIAGNOSIS — Z5181 Encounter for therapeutic drug level monitoring: Secondary | ICD-10-CM | POA: Diagnosis not present

## 2018-03-30 DIAGNOSIS — R41841 Cognitive communication deficit: Secondary | ICD-10-CM | POA: Diagnosis not present

## 2018-03-30 DIAGNOSIS — R4701 Aphasia: Secondary | ICD-10-CM | POA: Diagnosis not present

## 2018-03-30 DIAGNOSIS — R1312 Dysphagia, oropharyngeal phase: Secondary | ICD-10-CM | POA: Diagnosis not present

## 2018-03-30 DIAGNOSIS — R293 Abnormal posture: Secondary | ICD-10-CM | POA: Diagnosis not present

## 2018-03-30 DIAGNOSIS — I69120 Aphasia following nontraumatic intracerebral hemorrhage: Secondary | ICD-10-CM | POA: Diagnosis not present

## 2018-03-30 DIAGNOSIS — R2689 Other abnormalities of gait and mobility: Secondary | ICD-10-CM | POA: Diagnosis not present

## 2018-04-01 DIAGNOSIS — R4701 Aphasia: Secondary | ICD-10-CM | POA: Diagnosis not present

## 2018-04-01 DIAGNOSIS — R293 Abnormal posture: Secondary | ICD-10-CM | POA: Diagnosis not present

## 2018-04-01 DIAGNOSIS — I69120 Aphasia following nontraumatic intracerebral hemorrhage: Secondary | ICD-10-CM | POA: Diagnosis not present

## 2018-04-01 DIAGNOSIS — R2689 Other abnormalities of gait and mobility: Secondary | ICD-10-CM | POA: Diagnosis not present

## 2018-04-01 DIAGNOSIS — R1312 Dysphagia, oropharyngeal phase: Secondary | ICD-10-CM | POA: Diagnosis not present

## 2018-04-01 DIAGNOSIS — R41841 Cognitive communication deficit: Secondary | ICD-10-CM | POA: Diagnosis not present

## 2018-04-02 DIAGNOSIS — R2689 Other abnormalities of gait and mobility: Secondary | ICD-10-CM | POA: Diagnosis not present

## 2018-04-02 DIAGNOSIS — R1312 Dysphagia, oropharyngeal phase: Secondary | ICD-10-CM | POA: Diagnosis not present

## 2018-04-02 DIAGNOSIS — R4701 Aphasia: Secondary | ICD-10-CM | POA: Diagnosis not present

## 2018-04-02 DIAGNOSIS — I69120 Aphasia following nontraumatic intracerebral hemorrhage: Secondary | ICD-10-CM | POA: Diagnosis not present

## 2018-04-02 DIAGNOSIS — R293 Abnormal posture: Secondary | ICD-10-CM | POA: Diagnosis not present

## 2018-04-02 DIAGNOSIS — R41841 Cognitive communication deficit: Secondary | ICD-10-CM | POA: Diagnosis not present

## 2018-04-06 ENCOUNTER — Non-Acute Institutional Stay: Payer: Medicare Other | Admitting: Internal Medicine

## 2018-04-06 DIAGNOSIS — R531 Weakness: Secondary | ICD-10-CM

## 2018-04-06 DIAGNOSIS — R630 Anorexia: Secondary | ICD-10-CM

## 2018-04-06 DIAGNOSIS — I629 Nontraumatic intracranial hemorrhage, unspecified: Secondary | ICD-10-CM

## 2018-04-06 NOTE — Progress Notes (Signed)
PALLIATIVE CARE CONSULT VISIT   PATIENT NAME: Randy Durham DOB: 1934/02/08 MRN: [id number]  PRIMARY CARE PROVIDER:   Renford DillsPolite, Ronald, MD  REFERRING PROVIDER:      [facility]. Bernette [facility]birdAmy [facility]enthal [facility]   RESPONSIBLE PARTY:   Mitzie NaLucille Barrasso (wife) [phone], [phone] cell    RECOMMENDATIONS and PLAN:  1.  Nontraumatic intracranial hemorrhage I62.9:  Baseline cognitive and functional deficits. FAST stage 7d  Continue supportive care.  2.  Weakness generalized  R53.1: Chronic state and at baseline.  Requires total care.  3.  Anorexia R63.0:  Tolerating tube feeding via PEG well.  No oral intake. Continue to monitor albumin and chemistries.    4.  Advanced care planning:  Long term resident of snf.  No expectation of improvement of cognitive or functional decline.  Goals are to maintain current level of care and attempt to prevent acute health occurrences.  Palliative care will continue to follow.  [facility] spent 15 [facility] providing [facility],  from 1:30pm to 1:45pm. More than 50% of the time in this consultation was spent coordinating communication with clinical staff.   HISTORY OF PRESENT ILLNESS:  Routine follow-up Randy Durham finds that he has been without acute illness, falls or hospitalizations. He remains total care. No complaints from nursing staff at this time.   CODE STATUS: DNAR  PPS: 20% No oral nutritional intake HOSPICE ELIGIBILITY/DIAGNOSIS: TBD  PAST MEDICAL HISTORY:  Past Medical History:  Diagnosis Date  . Anemia   . CAD (coronary artery disease)    Cath 03/26/2015 100% distal LAD stenosis, 80% ostial D1 stenosis, 75% ramus stenosis, 60% RPDA stenosis, 30% proximal RCA stenosis. Medical therapy, outpt myoview to assess LCx lesion  . Chronic kidney disease (CKD), stage III (moderate) (HCC)   . Dysrhythmia    a fib  . Hyperlipidemia   . Hypertension   . Hypothyroidism   . Obesity (BMI 30-39.9)   . Spinal stenosis   . Stroke  (HCC)    06/23/17  . Thyroid nodule     SOCIAL HX:  Social History   Tobacco Use  . Smoking status: Former Smoker    Types: Cigars, Pipe  . Smokeless tobacco: Never Used  Substance Use Topics  . Alcohol use: No    ALLERGIES: No Known Allergies   PERTINENT MEDICATIONS:  Outpatient Encounter Medications as of 04/06/2018  Medication Sig  . apixaban (ELIQUIS) 5 MG TABS tablet Take 1 tablet (5 mg total) by mouth 2 (two) times daily.  Marland Kitchen. atorvastatin (LIPITOR) 80 MG tablet Take 1 tablet (80 mg total) by mouth daily at 6 PM.  . levothyroxine (SYNTHROID, LEVOTHROID) 112 MCG tablet Take 112 mcg by mouth daily before breakfast.  . methocarbamol (ROBAXIN) 500 MG tablet Take 500 mg by mouth 4 (four) times daily.  Marland Kitchen. nystatin (MYCOSTATIN) 100000 UNIT/ML suspension Take 5 mLs by mouth 4 (four) times daily.  Marland Kitchen. saccharomyces boulardii (FLORASTOR) 250 MG capsule Take 1 capsule (250 mg total) by mouth 2 (two) times daily.  . tamsulosin (FLOMAX) 0.4 MG CAPS capsule Take 0.4 mg by mouth.  . Valproate Sodium (DEPAKENE) 250 MG/5ML SOLN solution Take 10 mLs (500 mg total) by mouth 2 (two) times daily.   No facility-administered encounter medications on file as of 04/06/2018.     PHYSICAL EXAM:   General: Well nourished elderly male reclining in bed.  In NAD Cardiovascular: regular rate and rhythm Pulmonary: clear ant fields Abdomen: soft, nontender, + bowel sounds.  Tube feedings  infusing via PEG tube. Extremities: no edema, no joint deformities Skin: Exposed skin is intact Neurological: Somnolent, generalized weakness.  Non-verbal  [name], NP-C

## 2018-04-07 DIAGNOSIS — R4701 Aphasia: Secondary | ICD-10-CM | POA: Diagnosis not present

## 2018-04-07 DIAGNOSIS — I69120 Aphasia following nontraumatic intracerebral hemorrhage: Secondary | ICD-10-CM | POA: Diagnosis not present

## 2018-04-07 DIAGNOSIS — R293 Abnormal posture: Secondary | ICD-10-CM | POA: Diagnosis not present

## 2018-04-07 DIAGNOSIS — R2689 Other abnormalities of gait and mobility: Secondary | ICD-10-CM | POA: Diagnosis not present

## 2018-04-07 DIAGNOSIS — R41841 Cognitive communication deficit: Secondary | ICD-10-CM | POA: Diagnosis not present

## 2018-04-07 DIAGNOSIS — R1312 Dysphagia, oropharyngeal phase: Secondary | ICD-10-CM | POA: Diagnosis not present

## 2018-04-08 DIAGNOSIS — R4701 Aphasia: Secondary | ICD-10-CM | POA: Diagnosis not present

## 2018-04-08 DIAGNOSIS — R41841 Cognitive communication deficit: Secondary | ICD-10-CM | POA: Diagnosis not present

## 2018-04-08 DIAGNOSIS — R1312 Dysphagia, oropharyngeal phase: Secondary | ICD-10-CM | POA: Diagnosis not present

## 2018-04-08 DIAGNOSIS — R2689 Other abnormalities of gait and mobility: Secondary | ICD-10-CM | POA: Diagnosis not present

## 2018-04-08 DIAGNOSIS — I69120 Aphasia following nontraumatic intracerebral hemorrhage: Secondary | ICD-10-CM | POA: Diagnosis not present

## 2018-04-08 DIAGNOSIS — R293 Abnormal posture: Secondary | ICD-10-CM | POA: Diagnosis not present

## 2018-04-09 DIAGNOSIS — R4701 Aphasia: Secondary | ICD-10-CM | POA: Diagnosis not present

## 2018-04-09 DIAGNOSIS — R41841 Cognitive communication deficit: Secondary | ICD-10-CM | POA: Diagnosis not present

## 2018-04-09 DIAGNOSIS — I69120 Aphasia following nontraumatic intracerebral hemorrhage: Secondary | ICD-10-CM | POA: Diagnosis not present

## 2018-04-09 DIAGNOSIS — R1312 Dysphagia, oropharyngeal phase: Secondary | ICD-10-CM | POA: Diagnosis not present

## 2018-04-09 DIAGNOSIS — R2689 Other abnormalities of gait and mobility: Secondary | ICD-10-CM | POA: Diagnosis not present

## 2018-04-09 DIAGNOSIS — R293 Abnormal posture: Secondary | ICD-10-CM | POA: Diagnosis not present

## 2018-04-12 DIAGNOSIS — R1312 Dysphagia, oropharyngeal phase: Secondary | ICD-10-CM | POA: Diagnosis not present

## 2018-04-12 DIAGNOSIS — R2689 Other abnormalities of gait and mobility: Secondary | ICD-10-CM | POA: Diagnosis not present

## 2018-04-12 DIAGNOSIS — I69120 Aphasia following nontraumatic intracerebral hemorrhage: Secondary | ICD-10-CM | POA: Diagnosis not present

## 2018-04-12 DIAGNOSIS — R293 Abnormal posture: Secondary | ICD-10-CM | POA: Diagnosis not present

## 2018-04-12 DIAGNOSIS — R41841 Cognitive communication deficit: Secondary | ICD-10-CM | POA: Diagnosis not present

## 2018-04-12 DIAGNOSIS — R4701 Aphasia: Secondary | ICD-10-CM | POA: Diagnosis not present

## 2018-04-13 DIAGNOSIS — R2689 Other abnormalities of gait and mobility: Secondary | ICD-10-CM | POA: Diagnosis not present

## 2018-04-13 DIAGNOSIS — R1312 Dysphagia, oropharyngeal phase: Secondary | ICD-10-CM | POA: Diagnosis not present

## 2018-04-13 DIAGNOSIS — R41841 Cognitive communication deficit: Secondary | ICD-10-CM | POA: Diagnosis not present

## 2018-04-13 DIAGNOSIS — R4701 Aphasia: Secondary | ICD-10-CM | POA: Diagnosis not present

## 2018-04-13 DIAGNOSIS — I69120 Aphasia following nontraumatic intracerebral hemorrhage: Secondary | ICD-10-CM | POA: Diagnosis not present

## 2018-04-13 DIAGNOSIS — R293 Abnormal posture: Secondary | ICD-10-CM | POA: Diagnosis not present

## 2018-04-14 DIAGNOSIS — R293 Abnormal posture: Secondary | ICD-10-CM | POA: Diagnosis not present

## 2018-04-14 DIAGNOSIS — R2689 Other abnormalities of gait and mobility: Secondary | ICD-10-CM | POA: Diagnosis not present

## 2018-04-14 DIAGNOSIS — R41841 Cognitive communication deficit: Secondary | ICD-10-CM | POA: Diagnosis not present

## 2018-04-14 DIAGNOSIS — R4701 Aphasia: Secondary | ICD-10-CM | POA: Diagnosis not present

## 2018-04-14 DIAGNOSIS — I69120 Aphasia following nontraumatic intracerebral hemorrhage: Secondary | ICD-10-CM | POA: Diagnosis not present

## 2018-04-14 DIAGNOSIS — R1312 Dysphagia, oropharyngeal phase: Secondary | ICD-10-CM | POA: Diagnosis not present

## 2018-04-15 DIAGNOSIS — R2689 Other abnormalities of gait and mobility: Secondary | ICD-10-CM | POA: Diagnosis not present

## 2018-04-15 DIAGNOSIS — I69120 Aphasia following nontraumatic intracerebral hemorrhage: Secondary | ICD-10-CM | POA: Diagnosis not present

## 2018-04-15 DIAGNOSIS — R4701 Aphasia: Secondary | ICD-10-CM | POA: Diagnosis not present

## 2018-04-15 DIAGNOSIS — R41841 Cognitive communication deficit: Secondary | ICD-10-CM | POA: Diagnosis not present

## 2018-04-15 DIAGNOSIS — R1312 Dysphagia, oropharyngeal phase: Secondary | ICD-10-CM | POA: Diagnosis not present

## 2018-04-15 DIAGNOSIS — R293 Abnormal posture: Secondary | ICD-10-CM | POA: Diagnosis not present

## 2018-04-20 DIAGNOSIS — R4701 Aphasia: Secondary | ICD-10-CM | POA: Diagnosis not present

## 2018-04-20 DIAGNOSIS — I69120 Aphasia following nontraumatic intracerebral hemorrhage: Secondary | ICD-10-CM | POA: Diagnosis not present

## 2018-04-20 DIAGNOSIS — R293 Abnormal posture: Secondary | ICD-10-CM | POA: Diagnosis not present

## 2018-04-20 DIAGNOSIS — R1312 Dysphagia, oropharyngeal phase: Secondary | ICD-10-CM | POA: Diagnosis not present

## 2018-04-20 DIAGNOSIS — R41841 Cognitive communication deficit: Secondary | ICD-10-CM | POA: Diagnosis not present

## 2018-04-20 DIAGNOSIS — R2689 Other abnormalities of gait and mobility: Secondary | ICD-10-CM | POA: Diagnosis not present

## 2018-04-21 DIAGNOSIS — R293 Abnormal posture: Secondary | ICD-10-CM | POA: Diagnosis not present

## 2018-04-21 DIAGNOSIS — R2689 Other abnormalities of gait and mobility: Secondary | ICD-10-CM | POA: Diagnosis not present

## 2018-04-21 DIAGNOSIS — R4701 Aphasia: Secondary | ICD-10-CM | POA: Diagnosis not present

## 2018-04-21 DIAGNOSIS — R41841 Cognitive communication deficit: Secondary | ICD-10-CM | POA: Diagnosis not present

## 2018-04-21 DIAGNOSIS — I69120 Aphasia following nontraumatic intracerebral hemorrhage: Secondary | ICD-10-CM | POA: Diagnosis not present

## 2018-04-21 DIAGNOSIS — R1312 Dysphagia, oropharyngeal phase: Secondary | ICD-10-CM | POA: Diagnosis not present

## 2018-04-22 DIAGNOSIS — I69120 Aphasia following nontraumatic intracerebral hemorrhage: Secondary | ICD-10-CM | POA: Diagnosis not present

## 2018-04-22 DIAGNOSIS — R1312 Dysphagia, oropharyngeal phase: Secondary | ICD-10-CM | POA: Diagnosis not present

## 2018-04-22 DIAGNOSIS — R293 Abnormal posture: Secondary | ICD-10-CM | POA: Diagnosis not present

## 2018-04-22 DIAGNOSIS — R41841 Cognitive communication deficit: Secondary | ICD-10-CM | POA: Diagnosis not present

## 2018-04-22 DIAGNOSIS — R2689 Other abnormalities of gait and mobility: Secondary | ICD-10-CM | POA: Diagnosis not present

## 2018-04-22 DIAGNOSIS — R4701 Aphasia: Secondary | ICD-10-CM | POA: Diagnosis not present

## 2018-04-23 DIAGNOSIS — I619 Nontraumatic intracerebral hemorrhage, unspecified: Secondary | ICD-10-CM | POA: Diagnosis not present

## 2018-04-23 DIAGNOSIS — Z9889 Other specified postprocedural states: Secondary | ICD-10-CM | POA: Diagnosis not present

## 2018-04-23 DIAGNOSIS — N183 Chronic kidney disease, stage 3 (moderate): Secondary | ICD-10-CM | POA: Diagnosis not present

## 2018-04-23 DIAGNOSIS — I10 Essential (primary) hypertension: Secondary | ICD-10-CM | POA: Diagnosis not present

## 2018-04-26 DIAGNOSIS — R569 Unspecified convulsions: Secondary | ICD-10-CM | POA: Diagnosis not present

## 2018-04-26 DIAGNOSIS — R531 Weakness: Secondary | ICD-10-CM | POA: Diagnosis not present

## 2018-04-26 DIAGNOSIS — G4489 Other headache syndrome: Secondary | ICD-10-CM | POA: Diagnosis not present

## 2018-04-27 DIAGNOSIS — R2689 Other abnormalities of gait and mobility: Secondary | ICD-10-CM | POA: Diagnosis not present

## 2018-04-27 DIAGNOSIS — I69120 Aphasia following nontraumatic intracerebral hemorrhage: Secondary | ICD-10-CM | POA: Diagnosis not present

## 2018-04-27 DIAGNOSIS — R1312 Dysphagia, oropharyngeal phase: Secondary | ICD-10-CM | POA: Diagnosis not present

## 2018-04-27 DIAGNOSIS — R41841 Cognitive communication deficit: Secondary | ICD-10-CM | POA: Diagnosis not present

## 2018-04-27 DIAGNOSIS — R4701 Aphasia: Secondary | ICD-10-CM | POA: Diagnosis not present

## 2018-04-27 DIAGNOSIS — R293 Abnormal posture: Secondary | ICD-10-CM | POA: Diagnosis not present

## 2018-04-28 DIAGNOSIS — R1312 Dysphagia, oropharyngeal phase: Secondary | ICD-10-CM | POA: Diagnosis not present

## 2018-04-28 DIAGNOSIS — R4701 Aphasia: Secondary | ICD-10-CM | POA: Diagnosis not present

## 2018-04-28 DIAGNOSIS — R41841 Cognitive communication deficit: Secondary | ICD-10-CM | POA: Diagnosis not present

## 2018-04-28 DIAGNOSIS — R2689 Other abnormalities of gait and mobility: Secondary | ICD-10-CM | POA: Diagnosis not present

## 2018-04-28 DIAGNOSIS — I69120 Aphasia following nontraumatic intracerebral hemorrhage: Secondary | ICD-10-CM | POA: Diagnosis not present

## 2018-04-28 DIAGNOSIS — R293 Abnormal posture: Secondary | ICD-10-CM | POA: Diagnosis not present

## 2018-04-29 DIAGNOSIS — R4701 Aphasia: Secondary | ICD-10-CM | POA: Diagnosis not present

## 2018-04-29 DIAGNOSIS — R2689 Other abnormalities of gait and mobility: Secondary | ICD-10-CM | POA: Diagnosis not present

## 2018-04-29 DIAGNOSIS — R1312 Dysphagia, oropharyngeal phase: Secondary | ICD-10-CM | POA: Diagnosis not present

## 2018-04-29 DIAGNOSIS — R293 Abnormal posture: Secondary | ICD-10-CM | POA: Diagnosis not present

## 2018-04-29 DIAGNOSIS — R41841 Cognitive communication deficit: Secondary | ICD-10-CM | POA: Diagnosis not present

## 2018-04-29 DIAGNOSIS — I69120 Aphasia following nontraumatic intracerebral hemorrhage: Secondary | ICD-10-CM | POA: Diagnosis not present

## 2018-04-30 DIAGNOSIS — I739 Peripheral vascular disease, unspecified: Secondary | ICD-10-CM | POA: Diagnosis not present

## 2018-04-30 DIAGNOSIS — B351 Tinea unguium: Secondary | ICD-10-CM | POA: Diagnosis not present

## 2018-05-01 DIAGNOSIS — R630 Anorexia: Secondary | ICD-10-CM | POA: Insufficient documentation

## 2018-05-01 DIAGNOSIS — R531 Weakness: Secondary | ICD-10-CM | POA: Insufficient documentation

## 2018-05-04 DIAGNOSIS — R4701 Aphasia: Secondary | ICD-10-CM | POA: Diagnosis not present

## 2018-05-04 DIAGNOSIS — R1312 Dysphagia, oropharyngeal phase: Secondary | ICD-10-CM | POA: Diagnosis not present

## 2018-05-04 DIAGNOSIS — R2689 Other abnormalities of gait and mobility: Secondary | ICD-10-CM | POA: Diagnosis not present

## 2018-05-04 DIAGNOSIS — R293 Abnormal posture: Secondary | ICD-10-CM | POA: Diagnosis not present

## 2018-05-04 DIAGNOSIS — I69120 Aphasia following nontraumatic intracerebral hemorrhage: Secondary | ICD-10-CM | POA: Diagnosis not present

## 2018-05-04 DIAGNOSIS — R41841 Cognitive communication deficit: Secondary | ICD-10-CM | POA: Diagnosis not present

## 2018-05-05 DIAGNOSIS — R4701 Aphasia: Secondary | ICD-10-CM | POA: Diagnosis not present

## 2018-05-05 DIAGNOSIS — I69120 Aphasia following nontraumatic intracerebral hemorrhage: Secondary | ICD-10-CM | POA: Diagnosis not present

## 2018-05-05 DIAGNOSIS — R293 Abnormal posture: Secondary | ICD-10-CM | POA: Diagnosis not present

## 2018-05-05 DIAGNOSIS — R2689 Other abnormalities of gait and mobility: Secondary | ICD-10-CM | POA: Diagnosis not present

## 2018-05-05 DIAGNOSIS — R1312 Dysphagia, oropharyngeal phase: Secondary | ICD-10-CM | POA: Diagnosis not present

## 2018-05-05 DIAGNOSIS — R41841 Cognitive communication deficit: Secondary | ICD-10-CM | POA: Diagnosis not present

## 2018-05-06 DIAGNOSIS — I69120 Aphasia following nontraumatic intracerebral hemorrhage: Secondary | ICD-10-CM | POA: Diagnosis not present

## 2018-05-06 DIAGNOSIS — R2689 Other abnormalities of gait and mobility: Secondary | ICD-10-CM | POA: Diagnosis not present

## 2018-05-06 DIAGNOSIS — R4701 Aphasia: Secondary | ICD-10-CM | POA: Diagnosis not present

## 2018-05-06 DIAGNOSIS — R1312 Dysphagia, oropharyngeal phase: Secondary | ICD-10-CM | POA: Diagnosis not present

## 2018-05-06 DIAGNOSIS — R41841 Cognitive communication deficit: Secondary | ICD-10-CM | POA: Diagnosis not present

## 2018-05-06 DIAGNOSIS — R293 Abnormal posture: Secondary | ICD-10-CM | POA: Diagnosis not present

## 2018-05-09 DIAGNOSIS — B379 Candidiasis, unspecified: Secondary | ICD-10-CM | POA: Diagnosis not present

## 2018-05-11 DIAGNOSIS — B379 Candidiasis, unspecified: Secondary | ICD-10-CM | POA: Diagnosis not present

## 2018-05-11 DIAGNOSIS — R102 Pelvic and perineal pain: Secondary | ICD-10-CM | POA: Diagnosis not present

## 2018-05-12 DIAGNOSIS — N390 Urinary tract infection, site not specified: Secondary | ICD-10-CM | POA: Diagnosis not present

## 2018-05-13 DIAGNOSIS — R4701 Aphasia: Secondary | ICD-10-CM | POA: Diagnosis not present

## 2018-05-13 DIAGNOSIS — R293 Abnormal posture: Secondary | ICD-10-CM | POA: Diagnosis not present

## 2018-05-13 DIAGNOSIS — R41841 Cognitive communication deficit: Secondary | ICD-10-CM | POA: Diagnosis not present

## 2018-05-13 DIAGNOSIS — R1312 Dysphagia, oropharyngeal phase: Secondary | ICD-10-CM | POA: Diagnosis not present

## 2018-05-13 DIAGNOSIS — I69120 Aphasia following nontraumatic intracerebral hemorrhage: Secondary | ICD-10-CM | POA: Diagnosis not present

## 2018-05-13 DIAGNOSIS — R2689 Other abnormalities of gait and mobility: Secondary | ICD-10-CM | POA: Diagnosis not present

## 2018-05-14 DIAGNOSIS — R2689 Other abnormalities of gait and mobility: Secondary | ICD-10-CM | POA: Diagnosis not present

## 2018-05-14 DIAGNOSIS — R4701 Aphasia: Secondary | ICD-10-CM | POA: Diagnosis not present

## 2018-05-14 DIAGNOSIS — R41841 Cognitive communication deficit: Secondary | ICD-10-CM | POA: Diagnosis not present

## 2018-05-14 DIAGNOSIS — I69120 Aphasia following nontraumatic intracerebral hemorrhage: Secondary | ICD-10-CM | POA: Diagnosis not present

## 2018-05-14 DIAGNOSIS — R1312 Dysphagia, oropharyngeal phase: Secondary | ICD-10-CM | POA: Diagnosis not present

## 2018-05-14 DIAGNOSIS — R293 Abnormal posture: Secondary | ICD-10-CM | POA: Diagnosis not present

## 2018-05-17 DIAGNOSIS — I69120 Aphasia following nontraumatic intracerebral hemorrhage: Secondary | ICD-10-CM | POA: Diagnosis not present

## 2018-05-17 DIAGNOSIS — R1312 Dysphagia, oropharyngeal phase: Secondary | ICD-10-CM | POA: Diagnosis not present

## 2018-05-17 DIAGNOSIS — R2689 Other abnormalities of gait and mobility: Secondary | ICD-10-CM | POA: Diagnosis not present

## 2018-05-17 DIAGNOSIS — R41841 Cognitive communication deficit: Secondary | ICD-10-CM | POA: Diagnosis not present

## 2018-05-17 DIAGNOSIS — R4701 Aphasia: Secondary | ICD-10-CM | POA: Diagnosis not present

## 2018-05-17 DIAGNOSIS — R293 Abnormal posture: Secondary | ICD-10-CM | POA: Diagnosis not present

## 2018-05-18 DIAGNOSIS — R293 Abnormal posture: Secondary | ICD-10-CM | POA: Diagnosis not present

## 2018-05-18 DIAGNOSIS — R1312 Dysphagia, oropharyngeal phase: Secondary | ICD-10-CM | POA: Diagnosis not present

## 2018-05-18 DIAGNOSIS — I69120 Aphasia following nontraumatic intracerebral hemorrhage: Secondary | ICD-10-CM | POA: Diagnosis not present

## 2018-05-18 DIAGNOSIS — R4701 Aphasia: Secondary | ICD-10-CM | POA: Diagnosis not present

## 2018-05-18 DIAGNOSIS — R2689 Other abnormalities of gait and mobility: Secondary | ICD-10-CM | POA: Diagnosis not present

## 2018-05-18 DIAGNOSIS — R41841 Cognitive communication deficit: Secondary | ICD-10-CM | POA: Diagnosis not present

## 2018-05-19 DIAGNOSIS — R1312 Dysphagia, oropharyngeal phase: Secondary | ICD-10-CM | POA: Diagnosis not present

## 2018-05-19 DIAGNOSIS — I480 Paroxysmal atrial fibrillation: Secondary | ICD-10-CM | POA: Diagnosis not present

## 2018-05-19 DIAGNOSIS — R2689 Other abnormalities of gait and mobility: Secondary | ICD-10-CM | POA: Diagnosis not present

## 2018-05-19 DIAGNOSIS — R4701 Aphasia: Secondary | ICD-10-CM | POA: Diagnosis not present

## 2018-05-19 DIAGNOSIS — I619 Nontraumatic intracerebral hemorrhage, unspecified: Secondary | ICD-10-CM | POA: Diagnosis not present

## 2018-05-19 DIAGNOSIS — I69120 Aphasia following nontraumatic intracerebral hemorrhage: Secondary | ICD-10-CM | POA: Diagnosis not present

## 2018-05-19 DIAGNOSIS — I10 Essential (primary) hypertension: Secondary | ICD-10-CM | POA: Diagnosis not present

## 2018-05-19 DIAGNOSIS — R41841 Cognitive communication deficit: Secondary | ICD-10-CM | POA: Diagnosis not present

## 2018-05-19 DIAGNOSIS — R293 Abnormal posture: Secondary | ICD-10-CM | POA: Diagnosis not present

## 2018-06-01 DIAGNOSIS — H2513 Age-related nuclear cataract, bilateral: Secondary | ICD-10-CM | POA: Diagnosis not present

## 2018-06-01 DIAGNOSIS — E119 Type 2 diabetes mellitus without complications: Secondary | ICD-10-CM | POA: Diagnosis not present

## 2018-06-01 DIAGNOSIS — H43813 Vitreous degeneration, bilateral: Secondary | ICD-10-CM | POA: Diagnosis not present

## 2018-06-01 DIAGNOSIS — H04123 Dry eye syndrome of bilateral lacrimal glands: Secondary | ICD-10-CM | POA: Diagnosis not present

## 2018-06-11 DIAGNOSIS — I639 Cerebral infarction, unspecified: Secondary | ICD-10-CM | POA: Diagnosis not present

## 2018-06-11 DIAGNOSIS — I480 Paroxysmal atrial fibrillation: Secondary | ICD-10-CM | POA: Diagnosis not present

## 2018-06-11 DIAGNOSIS — I619 Nontraumatic intracerebral hemorrhage, unspecified: Secondary | ICD-10-CM | POA: Diagnosis not present

## 2018-06-18 DIAGNOSIS — R05 Cough: Secondary | ICD-10-CM | POA: Diagnosis not present

## 2018-06-25 DIAGNOSIS — I480 Paroxysmal atrial fibrillation: Secondary | ICD-10-CM | POA: Diagnosis not present

## 2018-06-25 DIAGNOSIS — Z9889 Other specified postprocedural states: Secondary | ICD-10-CM | POA: Diagnosis not present

## 2018-06-25 DIAGNOSIS — I619 Nontraumatic intracerebral hemorrhage, unspecified: Secondary | ICD-10-CM | POA: Diagnosis not present

## 2018-06-25 DIAGNOSIS — N183 Chronic kidney disease, stage 3 (moderate): Secondary | ICD-10-CM | POA: Diagnosis not present

## 2018-06-29 DIAGNOSIS — I10 Essential (primary) hypertension: Secondary | ICD-10-CM | POA: Diagnosis not present

## 2018-07-09 DIAGNOSIS — B351 Tinea unguium: Secondary | ICD-10-CM | POA: Diagnosis not present

## 2018-07-09 DIAGNOSIS — I739 Peripheral vascular disease, unspecified: Secondary | ICD-10-CM | POA: Diagnosis not present

## 2018-07-22 DIAGNOSIS — N390 Urinary tract infection, site not specified: Secondary | ICD-10-CM | POA: Diagnosis not present

## 2018-07-28 DIAGNOSIS — N390 Urinary tract infection, site not specified: Secondary | ICD-10-CM | POA: Diagnosis not present

## 2018-07-28 DIAGNOSIS — M150 Primary generalized (osteo)arthritis: Secondary | ICD-10-CM | POA: Diagnosis not present

## 2018-07-28 DIAGNOSIS — I10 Essential (primary) hypertension: Secondary | ICD-10-CM | POA: Diagnosis not present

## 2018-07-30 DIAGNOSIS — R05 Cough: Secondary | ICD-10-CM | POA: Diagnosis not present

## 2018-08-18 ENCOUNTER — Non-Acute Institutional Stay: Payer: Self-pay | Admitting: Internal Medicine

## 2018-08-24 DIAGNOSIS — R41841 Cognitive communication deficit: Secondary | ICD-10-CM | POA: Diagnosis not present

## 2018-08-24 DIAGNOSIS — R2689 Other abnormalities of gait and mobility: Secondary | ICD-10-CM | POA: Diagnosis not present

## 2018-08-24 DIAGNOSIS — R293 Abnormal posture: Secondary | ICD-10-CM | POA: Diagnosis not present

## 2018-08-24 DIAGNOSIS — I69120 Aphasia following nontraumatic intracerebral hemorrhage: Secondary | ICD-10-CM | POA: Diagnosis not present

## 2018-08-24 DIAGNOSIS — M24561 Contracture, right knee: Secondary | ICD-10-CM | POA: Diagnosis not present

## 2018-08-24 DIAGNOSIS — R1312 Dysphagia, oropharyngeal phase: Secondary | ICD-10-CM | POA: Diagnosis not present

## 2018-08-24 DIAGNOSIS — R4701 Aphasia: Secondary | ICD-10-CM | POA: Diagnosis not present

## 2018-08-25 DIAGNOSIS — Z23 Encounter for immunization: Secondary | ICD-10-CM | POA: Diagnosis not present

## 2018-08-25 DIAGNOSIS — M24561 Contracture, right knee: Secondary | ICD-10-CM | POA: Diagnosis not present

## 2018-08-25 DIAGNOSIS — I69120 Aphasia following nontraumatic intracerebral hemorrhage: Secondary | ICD-10-CM | POA: Diagnosis not present

## 2018-08-25 DIAGNOSIS — R1312 Dysphagia, oropharyngeal phase: Secondary | ICD-10-CM | POA: Diagnosis not present

## 2018-08-25 DIAGNOSIS — R4701 Aphasia: Secondary | ICD-10-CM | POA: Diagnosis not present

## 2018-08-25 DIAGNOSIS — R2689 Other abnormalities of gait and mobility: Secondary | ICD-10-CM | POA: Diagnosis not present

## 2018-08-25 DIAGNOSIS — R41841 Cognitive communication deficit: Secondary | ICD-10-CM | POA: Diagnosis not present

## 2018-08-26 DIAGNOSIS — R41841 Cognitive communication deficit: Secondary | ICD-10-CM | POA: Diagnosis not present

## 2018-08-26 DIAGNOSIS — R1312 Dysphagia, oropharyngeal phase: Secondary | ICD-10-CM | POA: Diagnosis not present

## 2018-08-26 DIAGNOSIS — R2689 Other abnormalities of gait and mobility: Secondary | ICD-10-CM | POA: Diagnosis not present

## 2018-08-26 DIAGNOSIS — R4701 Aphasia: Secondary | ICD-10-CM | POA: Diagnosis not present

## 2018-08-26 DIAGNOSIS — I69120 Aphasia following nontraumatic intracerebral hemorrhage: Secondary | ICD-10-CM | POA: Diagnosis not present

## 2018-08-26 DIAGNOSIS — M24561 Contracture, right knee: Secondary | ICD-10-CM | POA: Diagnosis not present

## 2018-08-27 DIAGNOSIS — R1312 Dysphagia, oropharyngeal phase: Secondary | ICD-10-CM | POA: Diagnosis not present

## 2018-08-27 DIAGNOSIS — R2689 Other abnormalities of gait and mobility: Secondary | ICD-10-CM | POA: Diagnosis not present

## 2018-08-27 DIAGNOSIS — R41841 Cognitive communication deficit: Secondary | ICD-10-CM | POA: Diagnosis not present

## 2018-08-27 DIAGNOSIS — M24561 Contracture, right knee: Secondary | ICD-10-CM | POA: Diagnosis not present

## 2018-08-27 DIAGNOSIS — R4701 Aphasia: Secondary | ICD-10-CM | POA: Diagnosis not present

## 2018-08-27 DIAGNOSIS — I69120 Aphasia following nontraumatic intracerebral hemorrhage: Secondary | ICD-10-CM | POA: Diagnosis not present

## 2018-08-30 DIAGNOSIS — I69120 Aphasia following nontraumatic intracerebral hemorrhage: Secondary | ICD-10-CM | POA: Diagnosis not present

## 2018-08-30 DIAGNOSIS — R4701 Aphasia: Secondary | ICD-10-CM | POA: Diagnosis not present

## 2018-08-30 DIAGNOSIS — M24561 Contracture, right knee: Secondary | ICD-10-CM | POA: Diagnosis not present

## 2018-08-30 DIAGNOSIS — R41841 Cognitive communication deficit: Secondary | ICD-10-CM | POA: Diagnosis not present

## 2018-08-30 DIAGNOSIS — R2689 Other abnormalities of gait and mobility: Secondary | ICD-10-CM | POA: Diagnosis not present

## 2018-08-30 DIAGNOSIS — R1312 Dysphagia, oropharyngeal phase: Secondary | ICD-10-CM | POA: Diagnosis not present

## 2018-08-31 DIAGNOSIS — R4701 Aphasia: Secondary | ICD-10-CM | POA: Diagnosis not present

## 2018-08-31 DIAGNOSIS — R1312 Dysphagia, oropharyngeal phase: Secondary | ICD-10-CM | POA: Diagnosis not present

## 2018-08-31 DIAGNOSIS — R41841 Cognitive communication deficit: Secondary | ICD-10-CM | POA: Diagnosis not present

## 2018-08-31 DIAGNOSIS — I69120 Aphasia following nontraumatic intracerebral hemorrhage: Secondary | ICD-10-CM | POA: Diagnosis not present

## 2018-08-31 DIAGNOSIS — M24561 Contracture, right knee: Secondary | ICD-10-CM | POA: Diagnosis not present

## 2018-08-31 DIAGNOSIS — R2689 Other abnormalities of gait and mobility: Secondary | ICD-10-CM | POA: Diagnosis not present

## 2018-09-01 DIAGNOSIS — R41841 Cognitive communication deficit: Secondary | ICD-10-CM | POA: Diagnosis not present

## 2018-09-01 DIAGNOSIS — R2689 Other abnormalities of gait and mobility: Secondary | ICD-10-CM | POA: Diagnosis not present

## 2018-09-01 DIAGNOSIS — R4701 Aphasia: Secondary | ICD-10-CM | POA: Diagnosis not present

## 2018-09-01 DIAGNOSIS — M24561 Contracture, right knee: Secondary | ICD-10-CM | POA: Diagnosis not present

## 2018-09-01 DIAGNOSIS — R1312 Dysphagia, oropharyngeal phase: Secondary | ICD-10-CM | POA: Diagnosis not present

## 2018-09-01 DIAGNOSIS — I69120 Aphasia following nontraumatic intracerebral hemorrhage: Secondary | ICD-10-CM | POA: Diagnosis not present

## 2018-09-02 DIAGNOSIS — M24561 Contracture, right knee: Secondary | ICD-10-CM | POA: Diagnosis not present

## 2018-09-02 DIAGNOSIS — R2689 Other abnormalities of gait and mobility: Secondary | ICD-10-CM | POA: Diagnosis not present

## 2018-09-02 DIAGNOSIS — I69120 Aphasia following nontraumatic intracerebral hemorrhage: Secondary | ICD-10-CM | POA: Diagnosis not present

## 2018-09-02 DIAGNOSIS — R4701 Aphasia: Secondary | ICD-10-CM | POA: Diagnosis not present

## 2018-09-02 DIAGNOSIS — R1312 Dysphagia, oropharyngeal phase: Secondary | ICD-10-CM | POA: Diagnosis not present

## 2018-09-02 DIAGNOSIS — R41841 Cognitive communication deficit: Secondary | ICD-10-CM | POA: Diagnosis not present

## 2018-09-03 DIAGNOSIS — R4701 Aphasia: Secondary | ICD-10-CM | POA: Diagnosis not present

## 2018-09-03 DIAGNOSIS — R278 Other lack of coordination: Secondary | ICD-10-CM | POA: Diagnosis not present

## 2018-09-03 DIAGNOSIS — M24561 Contracture, right knee: Secondary | ICD-10-CM | POA: Diagnosis not present

## 2018-09-03 DIAGNOSIS — M6281 Muscle weakness (generalized): Secondary | ICD-10-CM | POA: Diagnosis not present

## 2018-09-03 DIAGNOSIS — I69120 Aphasia following nontraumatic intracerebral hemorrhage: Secondary | ICD-10-CM | POA: Diagnosis not present

## 2018-09-03 DIAGNOSIS — R41841 Cognitive communication deficit: Secondary | ICD-10-CM | POA: Diagnosis not present

## 2018-09-03 DIAGNOSIS — R1312 Dysphagia, oropharyngeal phase: Secondary | ICD-10-CM | POA: Diagnosis not present

## 2018-09-03 DIAGNOSIS — R293 Abnormal posture: Secondary | ICD-10-CM | POA: Diagnosis not present

## 2018-09-03 DIAGNOSIS — R2689 Other abnormalities of gait and mobility: Secondary | ICD-10-CM | POA: Diagnosis not present

## 2018-09-06 DIAGNOSIS — R1312 Dysphagia, oropharyngeal phase: Secondary | ICD-10-CM | POA: Diagnosis not present

## 2018-09-06 DIAGNOSIS — I10 Essential (primary) hypertension: Secondary | ICD-10-CM | POA: Diagnosis not present

## 2018-09-06 DIAGNOSIS — R41841 Cognitive communication deficit: Secondary | ICD-10-CM | POA: Diagnosis not present

## 2018-09-06 DIAGNOSIS — R2689 Other abnormalities of gait and mobility: Secondary | ICD-10-CM | POA: Diagnosis not present

## 2018-09-06 DIAGNOSIS — I69120 Aphasia following nontraumatic intracerebral hemorrhage: Secondary | ICD-10-CM | POA: Diagnosis not present

## 2018-09-06 DIAGNOSIS — M24561 Contracture, right knee: Secondary | ICD-10-CM | POA: Diagnosis not present

## 2018-09-06 DIAGNOSIS — E039 Hypothyroidism, unspecified: Secondary | ICD-10-CM | POA: Diagnosis not present

## 2018-09-06 DIAGNOSIS — I480 Paroxysmal atrial fibrillation: Secondary | ICD-10-CM | POA: Diagnosis not present

## 2018-09-06 DIAGNOSIS — I619 Nontraumatic intracerebral hemorrhage, unspecified: Secondary | ICD-10-CM | POA: Diagnosis not present

## 2018-09-06 DIAGNOSIS — R4701 Aphasia: Secondary | ICD-10-CM | POA: Diagnosis not present

## 2018-09-07 DIAGNOSIS — R2689 Other abnormalities of gait and mobility: Secondary | ICD-10-CM | POA: Diagnosis not present

## 2018-09-07 DIAGNOSIS — R1312 Dysphagia, oropharyngeal phase: Secondary | ICD-10-CM | POA: Diagnosis not present

## 2018-09-07 DIAGNOSIS — R41841 Cognitive communication deficit: Secondary | ICD-10-CM | POA: Diagnosis not present

## 2018-09-07 DIAGNOSIS — M24561 Contracture, right knee: Secondary | ICD-10-CM | POA: Diagnosis not present

## 2018-09-07 DIAGNOSIS — Z79899 Other long term (current) drug therapy: Secondary | ICD-10-CM | POA: Diagnosis not present

## 2018-09-07 DIAGNOSIS — I69120 Aphasia following nontraumatic intracerebral hemorrhage: Secondary | ICD-10-CM | POA: Diagnosis not present

## 2018-09-07 DIAGNOSIS — R4701 Aphasia: Secondary | ICD-10-CM | POA: Diagnosis not present

## 2018-09-08 DIAGNOSIS — R2689 Other abnormalities of gait and mobility: Secondary | ICD-10-CM | POA: Diagnosis not present

## 2018-09-08 DIAGNOSIS — R41841 Cognitive communication deficit: Secondary | ICD-10-CM | POA: Diagnosis not present

## 2018-09-08 DIAGNOSIS — R1312 Dysphagia, oropharyngeal phase: Secondary | ICD-10-CM | POA: Diagnosis not present

## 2018-09-08 DIAGNOSIS — I69120 Aphasia following nontraumatic intracerebral hemorrhage: Secondary | ICD-10-CM | POA: Diagnosis not present

## 2018-09-08 DIAGNOSIS — R4701 Aphasia: Secondary | ICD-10-CM | POA: Diagnosis not present

## 2018-09-08 DIAGNOSIS — M24561 Contracture, right knee: Secondary | ICD-10-CM | POA: Diagnosis not present

## 2018-09-09 DIAGNOSIS — E039 Hypothyroidism, unspecified: Secondary | ICD-10-CM | POA: Diagnosis not present

## 2018-09-09 DIAGNOSIS — R1312 Dysphagia, oropharyngeal phase: Secondary | ICD-10-CM | POA: Diagnosis not present

## 2018-09-09 DIAGNOSIS — R4701 Aphasia: Secondary | ICD-10-CM | POA: Diagnosis not present

## 2018-09-09 DIAGNOSIS — R41841 Cognitive communication deficit: Secondary | ICD-10-CM | POA: Diagnosis not present

## 2018-09-09 DIAGNOSIS — I69120 Aphasia following nontraumatic intracerebral hemorrhage: Secondary | ICD-10-CM | POA: Diagnosis not present

## 2018-09-09 DIAGNOSIS — R2689 Other abnormalities of gait and mobility: Secondary | ICD-10-CM | POA: Diagnosis not present

## 2018-09-09 DIAGNOSIS — M24561 Contracture, right knee: Secondary | ICD-10-CM | POA: Diagnosis not present

## 2018-09-10 DIAGNOSIS — R1312 Dysphagia, oropharyngeal phase: Secondary | ICD-10-CM | POA: Diagnosis not present

## 2018-09-10 DIAGNOSIS — M24561 Contracture, right knee: Secondary | ICD-10-CM | POA: Diagnosis not present

## 2018-09-10 DIAGNOSIS — R4701 Aphasia: Secondary | ICD-10-CM | POA: Diagnosis not present

## 2018-09-10 DIAGNOSIS — R41841 Cognitive communication deficit: Secondary | ICD-10-CM | POA: Diagnosis not present

## 2018-09-10 DIAGNOSIS — I69120 Aphasia following nontraumatic intracerebral hemorrhage: Secondary | ICD-10-CM | POA: Diagnosis not present

## 2018-09-10 DIAGNOSIS — R2689 Other abnormalities of gait and mobility: Secondary | ICD-10-CM | POA: Diagnosis not present

## 2018-09-13 DIAGNOSIS — M24561 Contracture, right knee: Secondary | ICD-10-CM | POA: Diagnosis not present

## 2018-09-13 DIAGNOSIS — R41841 Cognitive communication deficit: Secondary | ICD-10-CM | POA: Diagnosis not present

## 2018-09-13 DIAGNOSIS — R1312 Dysphagia, oropharyngeal phase: Secondary | ICD-10-CM | POA: Diagnosis not present

## 2018-09-13 DIAGNOSIS — I69120 Aphasia following nontraumatic intracerebral hemorrhage: Secondary | ICD-10-CM | POA: Diagnosis not present

## 2018-09-13 DIAGNOSIS — R2689 Other abnormalities of gait and mobility: Secondary | ICD-10-CM | POA: Diagnosis not present

## 2018-09-13 DIAGNOSIS — R4701 Aphasia: Secondary | ICD-10-CM | POA: Diagnosis not present

## 2018-09-14 DIAGNOSIS — R41841 Cognitive communication deficit: Secondary | ICD-10-CM | POA: Diagnosis not present

## 2018-09-14 DIAGNOSIS — R2689 Other abnormalities of gait and mobility: Secondary | ICD-10-CM | POA: Diagnosis not present

## 2018-09-14 DIAGNOSIS — R4701 Aphasia: Secondary | ICD-10-CM | POA: Diagnosis not present

## 2018-09-14 DIAGNOSIS — I69120 Aphasia following nontraumatic intracerebral hemorrhage: Secondary | ICD-10-CM | POA: Diagnosis not present

## 2018-09-14 DIAGNOSIS — M24561 Contracture, right knee: Secondary | ICD-10-CM | POA: Diagnosis not present

## 2018-09-14 DIAGNOSIS — R1312 Dysphagia, oropharyngeal phase: Secondary | ICD-10-CM | POA: Diagnosis not present

## 2018-09-15 DIAGNOSIS — R1312 Dysphagia, oropharyngeal phase: Secondary | ICD-10-CM | POA: Diagnosis not present

## 2018-09-15 DIAGNOSIS — M24561 Contracture, right knee: Secondary | ICD-10-CM | POA: Diagnosis not present

## 2018-09-15 DIAGNOSIS — R4701 Aphasia: Secondary | ICD-10-CM | POA: Diagnosis not present

## 2018-09-15 DIAGNOSIS — R2689 Other abnormalities of gait and mobility: Secondary | ICD-10-CM | POA: Diagnosis not present

## 2018-09-15 DIAGNOSIS — I69120 Aphasia following nontraumatic intracerebral hemorrhage: Secondary | ICD-10-CM | POA: Diagnosis not present

## 2018-09-15 DIAGNOSIS — R41841 Cognitive communication deficit: Secondary | ICD-10-CM | POA: Diagnosis not present

## 2018-09-16 DIAGNOSIS — R2689 Other abnormalities of gait and mobility: Secondary | ICD-10-CM | POA: Diagnosis not present

## 2018-09-16 DIAGNOSIS — R4701 Aphasia: Secondary | ICD-10-CM | POA: Diagnosis not present

## 2018-09-16 DIAGNOSIS — M24561 Contracture, right knee: Secondary | ICD-10-CM | POA: Diagnosis not present

## 2018-09-16 DIAGNOSIS — R1312 Dysphagia, oropharyngeal phase: Secondary | ICD-10-CM | POA: Diagnosis not present

## 2018-09-16 DIAGNOSIS — I69120 Aphasia following nontraumatic intracerebral hemorrhage: Secondary | ICD-10-CM | POA: Diagnosis not present

## 2018-09-16 DIAGNOSIS — R41841 Cognitive communication deficit: Secondary | ICD-10-CM | POA: Diagnosis not present

## 2018-09-17 DIAGNOSIS — R2689 Other abnormalities of gait and mobility: Secondary | ICD-10-CM | POA: Diagnosis not present

## 2018-09-17 DIAGNOSIS — R1312 Dysphagia, oropharyngeal phase: Secondary | ICD-10-CM | POA: Diagnosis not present

## 2018-09-17 DIAGNOSIS — M24561 Contracture, right knee: Secondary | ICD-10-CM | POA: Diagnosis not present

## 2018-09-17 DIAGNOSIS — R4701 Aphasia: Secondary | ICD-10-CM | POA: Diagnosis not present

## 2018-09-17 DIAGNOSIS — R41841 Cognitive communication deficit: Secondary | ICD-10-CM | POA: Diagnosis not present

## 2018-09-17 DIAGNOSIS — I69120 Aphasia following nontraumatic intracerebral hemorrhage: Secondary | ICD-10-CM | POA: Diagnosis not present

## 2018-09-20 DIAGNOSIS — R2689 Other abnormalities of gait and mobility: Secondary | ICD-10-CM | POA: Diagnosis not present

## 2018-09-20 DIAGNOSIS — M24561 Contracture, right knee: Secondary | ICD-10-CM | POA: Diagnosis not present

## 2018-09-20 DIAGNOSIS — R1312 Dysphagia, oropharyngeal phase: Secondary | ICD-10-CM | POA: Diagnosis not present

## 2018-09-20 DIAGNOSIS — R4701 Aphasia: Secondary | ICD-10-CM | POA: Diagnosis not present

## 2018-09-20 DIAGNOSIS — I69120 Aphasia following nontraumatic intracerebral hemorrhage: Secondary | ICD-10-CM | POA: Diagnosis not present

## 2018-09-20 DIAGNOSIS — R41841 Cognitive communication deficit: Secondary | ICD-10-CM | POA: Diagnosis not present

## 2018-09-21 DIAGNOSIS — R2689 Other abnormalities of gait and mobility: Secondary | ICD-10-CM | POA: Diagnosis not present

## 2018-09-21 DIAGNOSIS — R41841 Cognitive communication deficit: Secondary | ICD-10-CM | POA: Diagnosis not present

## 2018-09-21 DIAGNOSIS — R4701 Aphasia: Secondary | ICD-10-CM | POA: Diagnosis not present

## 2018-09-21 DIAGNOSIS — M24561 Contracture, right knee: Secondary | ICD-10-CM | POA: Diagnosis not present

## 2018-09-21 DIAGNOSIS — I69120 Aphasia following nontraumatic intracerebral hemorrhage: Secondary | ICD-10-CM | POA: Diagnosis not present

## 2018-09-21 DIAGNOSIS — R1312 Dysphagia, oropharyngeal phase: Secondary | ICD-10-CM | POA: Diagnosis not present

## 2018-09-22 DIAGNOSIS — M24561 Contracture, right knee: Secondary | ICD-10-CM | POA: Diagnosis not present

## 2018-09-22 DIAGNOSIS — R41841 Cognitive communication deficit: Secondary | ICD-10-CM | POA: Diagnosis not present

## 2018-09-22 DIAGNOSIS — R1312 Dysphagia, oropharyngeal phase: Secondary | ICD-10-CM | POA: Diagnosis not present

## 2018-09-22 DIAGNOSIS — R2689 Other abnormalities of gait and mobility: Secondary | ICD-10-CM | POA: Diagnosis not present

## 2018-09-22 DIAGNOSIS — R4701 Aphasia: Secondary | ICD-10-CM | POA: Diagnosis not present

## 2018-09-22 DIAGNOSIS — I69120 Aphasia following nontraumatic intracerebral hemorrhage: Secondary | ICD-10-CM | POA: Diagnosis not present

## 2018-09-23 DIAGNOSIS — R2689 Other abnormalities of gait and mobility: Secondary | ICD-10-CM | POA: Diagnosis not present

## 2018-09-23 DIAGNOSIS — R4701 Aphasia: Secondary | ICD-10-CM | POA: Diagnosis not present

## 2018-09-23 DIAGNOSIS — R1312 Dysphagia, oropharyngeal phase: Secondary | ICD-10-CM | POA: Diagnosis not present

## 2018-09-23 DIAGNOSIS — I69120 Aphasia following nontraumatic intracerebral hemorrhage: Secondary | ICD-10-CM | POA: Diagnosis not present

## 2018-09-23 DIAGNOSIS — M24561 Contracture, right knee: Secondary | ICD-10-CM | POA: Diagnosis not present

## 2018-09-23 DIAGNOSIS — R41841 Cognitive communication deficit: Secondary | ICD-10-CM | POA: Diagnosis not present

## 2018-09-24 DIAGNOSIS — I480 Paroxysmal atrial fibrillation: Secondary | ICD-10-CM | POA: Diagnosis not present

## 2018-09-24 DIAGNOSIS — R1312 Dysphagia, oropharyngeal phase: Secondary | ICD-10-CM | POA: Diagnosis not present

## 2018-09-24 DIAGNOSIS — I619 Nontraumatic intracerebral hemorrhage, unspecified: Secondary | ICD-10-CM | POA: Diagnosis not present

## 2018-09-24 DIAGNOSIS — I69120 Aphasia following nontraumatic intracerebral hemorrhage: Secondary | ICD-10-CM | POA: Diagnosis not present

## 2018-09-24 DIAGNOSIS — E039 Hypothyroidism, unspecified: Secondary | ICD-10-CM | POA: Diagnosis not present

## 2018-09-24 DIAGNOSIS — R41841 Cognitive communication deficit: Secondary | ICD-10-CM | POA: Diagnosis not present

## 2018-09-24 DIAGNOSIS — Z9889 Other specified postprocedural states: Secondary | ICD-10-CM | POA: Diagnosis not present

## 2018-09-24 DIAGNOSIS — M24561 Contracture, right knee: Secondary | ICD-10-CM | POA: Diagnosis not present

## 2018-09-24 DIAGNOSIS — R4701 Aphasia: Secondary | ICD-10-CM | POA: Diagnosis not present

## 2018-09-24 DIAGNOSIS — R2689 Other abnormalities of gait and mobility: Secondary | ICD-10-CM | POA: Diagnosis not present

## 2018-09-25 DIAGNOSIS — R2689 Other abnormalities of gait and mobility: Secondary | ICD-10-CM | POA: Diagnosis not present

## 2018-09-25 DIAGNOSIS — R4701 Aphasia: Secondary | ICD-10-CM | POA: Diagnosis not present

## 2018-09-25 DIAGNOSIS — I69120 Aphasia following nontraumatic intracerebral hemorrhage: Secondary | ICD-10-CM | POA: Diagnosis not present

## 2018-09-25 DIAGNOSIS — R41841 Cognitive communication deficit: Secondary | ICD-10-CM | POA: Diagnosis not present

## 2018-09-25 DIAGNOSIS — R1312 Dysphagia, oropharyngeal phase: Secondary | ICD-10-CM | POA: Diagnosis not present

## 2018-09-25 DIAGNOSIS — M24561 Contracture, right knee: Secondary | ICD-10-CM | POA: Diagnosis not present

## 2018-09-27 DIAGNOSIS — M24561 Contracture, right knee: Secondary | ICD-10-CM | POA: Diagnosis not present

## 2018-09-27 DIAGNOSIS — R1312 Dysphagia, oropharyngeal phase: Secondary | ICD-10-CM | POA: Diagnosis not present

## 2018-09-27 DIAGNOSIS — R41841 Cognitive communication deficit: Secondary | ICD-10-CM | POA: Diagnosis not present

## 2018-09-27 DIAGNOSIS — R4701 Aphasia: Secondary | ICD-10-CM | POA: Diagnosis not present

## 2018-09-27 DIAGNOSIS — I69120 Aphasia following nontraumatic intracerebral hemorrhage: Secondary | ICD-10-CM | POA: Diagnosis not present

## 2018-09-27 DIAGNOSIS — R2689 Other abnormalities of gait and mobility: Secondary | ICD-10-CM | POA: Diagnosis not present

## 2018-09-28 DIAGNOSIS — R4701 Aphasia: Secondary | ICD-10-CM | POA: Diagnosis not present

## 2018-09-28 DIAGNOSIS — M24561 Contracture, right knee: Secondary | ICD-10-CM | POA: Diagnosis not present

## 2018-09-28 DIAGNOSIS — R2689 Other abnormalities of gait and mobility: Secondary | ICD-10-CM | POA: Diagnosis not present

## 2018-09-28 DIAGNOSIS — R1312 Dysphagia, oropharyngeal phase: Secondary | ICD-10-CM | POA: Diagnosis not present

## 2018-09-28 DIAGNOSIS — R41841 Cognitive communication deficit: Secondary | ICD-10-CM | POA: Diagnosis not present

## 2018-09-28 DIAGNOSIS — I69120 Aphasia following nontraumatic intracerebral hemorrhage: Secondary | ICD-10-CM | POA: Diagnosis not present

## 2018-09-29 DIAGNOSIS — R2689 Other abnormalities of gait and mobility: Secondary | ICD-10-CM | POA: Diagnosis not present

## 2018-09-29 DIAGNOSIS — R1312 Dysphagia, oropharyngeal phase: Secondary | ICD-10-CM | POA: Diagnosis not present

## 2018-09-29 DIAGNOSIS — R4701 Aphasia: Secondary | ICD-10-CM | POA: Diagnosis not present

## 2018-09-29 DIAGNOSIS — R41841 Cognitive communication deficit: Secondary | ICD-10-CM | POA: Diagnosis not present

## 2018-09-29 DIAGNOSIS — I69120 Aphasia following nontraumatic intracerebral hemorrhage: Secondary | ICD-10-CM | POA: Diagnosis not present

## 2018-09-29 DIAGNOSIS — M24561 Contracture, right knee: Secondary | ICD-10-CM | POA: Diagnosis not present

## 2018-09-30 DIAGNOSIS — R41841 Cognitive communication deficit: Secondary | ICD-10-CM | POA: Diagnosis not present

## 2018-09-30 DIAGNOSIS — R2689 Other abnormalities of gait and mobility: Secondary | ICD-10-CM | POA: Diagnosis not present

## 2018-09-30 DIAGNOSIS — R1312 Dysphagia, oropharyngeal phase: Secondary | ICD-10-CM | POA: Diagnosis not present

## 2018-09-30 DIAGNOSIS — R4701 Aphasia: Secondary | ICD-10-CM | POA: Diagnosis not present

## 2018-09-30 DIAGNOSIS — M24561 Contracture, right knee: Secondary | ICD-10-CM | POA: Diagnosis not present

## 2018-09-30 DIAGNOSIS — I69120 Aphasia following nontraumatic intracerebral hemorrhage: Secondary | ICD-10-CM | POA: Diagnosis not present

## 2018-10-01 DIAGNOSIS — I69120 Aphasia following nontraumatic intracerebral hemorrhage: Secondary | ICD-10-CM | POA: Diagnosis not present

## 2018-10-01 DIAGNOSIS — R41841 Cognitive communication deficit: Secondary | ICD-10-CM | POA: Diagnosis not present

## 2018-10-01 DIAGNOSIS — R1312 Dysphagia, oropharyngeal phase: Secondary | ICD-10-CM | POA: Diagnosis not present

## 2018-10-01 DIAGNOSIS — R4701 Aphasia: Secondary | ICD-10-CM | POA: Diagnosis not present

## 2018-10-01 DIAGNOSIS — R2689 Other abnormalities of gait and mobility: Secondary | ICD-10-CM | POA: Diagnosis not present

## 2018-10-01 DIAGNOSIS — M24561 Contracture, right knee: Secondary | ICD-10-CM | POA: Diagnosis not present

## 2018-10-04 DIAGNOSIS — I69120 Aphasia following nontraumatic intracerebral hemorrhage: Secondary | ICD-10-CM | POA: Diagnosis not present

## 2018-10-04 DIAGNOSIS — R293 Abnormal posture: Secondary | ICD-10-CM | POA: Diagnosis not present

## 2018-10-04 DIAGNOSIS — R278 Other lack of coordination: Secondary | ICD-10-CM | POA: Diagnosis not present

## 2018-10-04 DIAGNOSIS — M6281 Muscle weakness (generalized): Secondary | ICD-10-CM | POA: Diagnosis not present

## 2018-10-04 DIAGNOSIS — R2689 Other abnormalities of gait and mobility: Secondary | ICD-10-CM | POA: Diagnosis not present

## 2018-10-04 DIAGNOSIS — R41841 Cognitive communication deficit: Secondary | ICD-10-CM | POA: Diagnosis not present

## 2018-10-04 DIAGNOSIS — R1312 Dysphagia, oropharyngeal phase: Secondary | ICD-10-CM | POA: Diagnosis not present

## 2018-10-04 DIAGNOSIS — R4701 Aphasia: Secondary | ICD-10-CM | POA: Diagnosis not present

## 2018-10-04 DIAGNOSIS — M24561 Contracture, right knee: Secondary | ICD-10-CM | POA: Diagnosis not present

## 2018-10-05 DIAGNOSIS — R41841 Cognitive communication deficit: Secondary | ICD-10-CM | POA: Diagnosis not present

## 2018-10-05 DIAGNOSIS — R4701 Aphasia: Secondary | ICD-10-CM | POA: Diagnosis not present

## 2018-10-05 DIAGNOSIS — R2689 Other abnormalities of gait and mobility: Secondary | ICD-10-CM | POA: Diagnosis not present

## 2018-10-05 DIAGNOSIS — I69120 Aphasia following nontraumatic intracerebral hemorrhage: Secondary | ICD-10-CM | POA: Diagnosis not present

## 2018-10-05 DIAGNOSIS — M24561 Contracture, right knee: Secondary | ICD-10-CM | POA: Diagnosis not present

## 2018-10-05 DIAGNOSIS — R1312 Dysphagia, oropharyngeal phase: Secondary | ICD-10-CM | POA: Diagnosis not present

## 2018-10-06 DIAGNOSIS — I69120 Aphasia following nontraumatic intracerebral hemorrhage: Secondary | ICD-10-CM | POA: Diagnosis not present

## 2018-10-06 DIAGNOSIS — R4701 Aphasia: Secondary | ICD-10-CM | POA: Diagnosis not present

## 2018-10-06 DIAGNOSIS — M24561 Contracture, right knee: Secondary | ICD-10-CM | POA: Diagnosis not present

## 2018-10-06 DIAGNOSIS — R1312 Dysphagia, oropharyngeal phase: Secondary | ICD-10-CM | POA: Diagnosis not present

## 2018-10-06 DIAGNOSIS — R2689 Other abnormalities of gait and mobility: Secondary | ICD-10-CM | POA: Diagnosis not present

## 2018-10-06 DIAGNOSIS — R41841 Cognitive communication deficit: Secondary | ICD-10-CM | POA: Diagnosis not present

## 2018-10-07 DIAGNOSIS — R4701 Aphasia: Secondary | ICD-10-CM | POA: Diagnosis not present

## 2018-10-07 DIAGNOSIS — R41841 Cognitive communication deficit: Secondary | ICD-10-CM | POA: Diagnosis not present

## 2018-10-07 DIAGNOSIS — R1312 Dysphagia, oropharyngeal phase: Secondary | ICD-10-CM | POA: Diagnosis not present

## 2018-10-07 DIAGNOSIS — M24561 Contracture, right knee: Secondary | ICD-10-CM | POA: Diagnosis not present

## 2018-10-07 DIAGNOSIS — R2689 Other abnormalities of gait and mobility: Secondary | ICD-10-CM | POA: Diagnosis not present

## 2018-10-07 DIAGNOSIS — I69120 Aphasia following nontraumatic intracerebral hemorrhage: Secondary | ICD-10-CM | POA: Diagnosis not present

## 2018-10-08 DIAGNOSIS — R2689 Other abnormalities of gait and mobility: Secondary | ICD-10-CM | POA: Diagnosis not present

## 2018-10-08 DIAGNOSIS — R41841 Cognitive communication deficit: Secondary | ICD-10-CM | POA: Diagnosis not present

## 2018-10-08 DIAGNOSIS — I69120 Aphasia following nontraumatic intracerebral hemorrhage: Secondary | ICD-10-CM | POA: Diagnosis not present

## 2018-10-08 DIAGNOSIS — M24561 Contracture, right knee: Secondary | ICD-10-CM | POA: Diagnosis not present

## 2018-10-08 DIAGNOSIS — R1312 Dysphagia, oropharyngeal phase: Secondary | ICD-10-CM | POA: Diagnosis not present

## 2018-10-08 DIAGNOSIS — R4701 Aphasia: Secondary | ICD-10-CM | POA: Diagnosis not present

## 2018-10-12 DIAGNOSIS — I69120 Aphasia following nontraumatic intracerebral hemorrhage: Secondary | ICD-10-CM | POA: Diagnosis not present

## 2018-10-12 DIAGNOSIS — R41841 Cognitive communication deficit: Secondary | ICD-10-CM | POA: Diagnosis not present

## 2018-10-12 DIAGNOSIS — R4701 Aphasia: Secondary | ICD-10-CM | POA: Diagnosis not present

## 2018-10-12 DIAGNOSIS — M24561 Contracture, right knee: Secondary | ICD-10-CM | POA: Diagnosis not present

## 2018-10-12 DIAGNOSIS — R2689 Other abnormalities of gait and mobility: Secondary | ICD-10-CM | POA: Diagnosis not present

## 2018-10-12 DIAGNOSIS — R1312 Dysphagia, oropharyngeal phase: Secondary | ICD-10-CM | POA: Diagnosis not present

## 2018-10-13 DIAGNOSIS — R4701 Aphasia: Secondary | ICD-10-CM | POA: Diagnosis not present

## 2018-10-13 DIAGNOSIS — M24561 Contracture, right knee: Secondary | ICD-10-CM | POA: Diagnosis not present

## 2018-10-13 DIAGNOSIS — R2689 Other abnormalities of gait and mobility: Secondary | ICD-10-CM | POA: Diagnosis not present

## 2018-10-13 DIAGNOSIS — I69120 Aphasia following nontraumatic intracerebral hemorrhage: Secondary | ICD-10-CM | POA: Diagnosis not present

## 2018-10-13 DIAGNOSIS — R1312 Dysphagia, oropharyngeal phase: Secondary | ICD-10-CM | POA: Diagnosis not present

## 2018-10-13 DIAGNOSIS — R41841 Cognitive communication deficit: Secondary | ICD-10-CM | POA: Diagnosis not present

## 2018-10-14 DIAGNOSIS — B351 Tinea unguium: Secondary | ICD-10-CM | POA: Diagnosis not present

## 2018-10-14 DIAGNOSIS — L603 Nail dystrophy: Secondary | ICD-10-CM | POA: Diagnosis not present

## 2018-10-14 DIAGNOSIS — E1151 Type 2 diabetes mellitus with diabetic peripheral angiopathy without gangrene: Secondary | ICD-10-CM | POA: Diagnosis not present

## 2018-10-15 DIAGNOSIS — R4701 Aphasia: Secondary | ICD-10-CM | POA: Diagnosis not present

## 2018-10-15 DIAGNOSIS — R41841 Cognitive communication deficit: Secondary | ICD-10-CM | POA: Diagnosis not present

## 2018-10-15 DIAGNOSIS — M24561 Contracture, right knee: Secondary | ICD-10-CM | POA: Diagnosis not present

## 2018-10-15 DIAGNOSIS — I69120 Aphasia following nontraumatic intracerebral hemorrhage: Secondary | ICD-10-CM | POA: Diagnosis not present

## 2018-10-15 DIAGNOSIS — R1312 Dysphagia, oropharyngeal phase: Secondary | ICD-10-CM | POA: Diagnosis not present

## 2018-10-15 DIAGNOSIS — R2689 Other abnormalities of gait and mobility: Secondary | ICD-10-CM | POA: Diagnosis not present

## 2018-10-19 DIAGNOSIS — M24561 Contracture, right knee: Secondary | ICD-10-CM | POA: Diagnosis not present

## 2018-10-19 DIAGNOSIS — R1312 Dysphagia, oropharyngeal phase: Secondary | ICD-10-CM | POA: Diagnosis not present

## 2018-10-19 DIAGNOSIS — R2689 Other abnormalities of gait and mobility: Secondary | ICD-10-CM | POA: Diagnosis not present

## 2018-10-19 DIAGNOSIS — R41841 Cognitive communication deficit: Secondary | ICD-10-CM | POA: Diagnosis not present

## 2018-10-19 DIAGNOSIS — R4701 Aphasia: Secondary | ICD-10-CM | POA: Diagnosis not present

## 2018-10-19 DIAGNOSIS — I69120 Aphasia following nontraumatic intracerebral hemorrhage: Secondary | ICD-10-CM | POA: Diagnosis not present

## 2018-10-20 DIAGNOSIS — R4701 Aphasia: Secondary | ICD-10-CM | POA: Diagnosis not present

## 2018-10-20 DIAGNOSIS — M24561 Contracture, right knee: Secondary | ICD-10-CM | POA: Diagnosis not present

## 2018-10-20 DIAGNOSIS — R2689 Other abnormalities of gait and mobility: Secondary | ICD-10-CM | POA: Diagnosis not present

## 2018-10-20 DIAGNOSIS — I69120 Aphasia following nontraumatic intracerebral hemorrhage: Secondary | ICD-10-CM | POA: Diagnosis not present

## 2018-10-20 DIAGNOSIS — R1312 Dysphagia, oropharyngeal phase: Secondary | ICD-10-CM | POA: Diagnosis not present

## 2018-10-20 DIAGNOSIS — R41841 Cognitive communication deficit: Secondary | ICD-10-CM | POA: Diagnosis not present

## 2018-10-21 DIAGNOSIS — R41841 Cognitive communication deficit: Secondary | ICD-10-CM | POA: Diagnosis not present

## 2018-10-21 DIAGNOSIS — R2689 Other abnormalities of gait and mobility: Secondary | ICD-10-CM | POA: Diagnosis not present

## 2018-10-21 DIAGNOSIS — R1312 Dysphagia, oropharyngeal phase: Secondary | ICD-10-CM | POA: Diagnosis not present

## 2018-10-21 DIAGNOSIS — I69120 Aphasia following nontraumatic intracerebral hemorrhage: Secondary | ICD-10-CM | POA: Diagnosis not present

## 2018-10-21 DIAGNOSIS — M24561 Contracture, right knee: Secondary | ICD-10-CM | POA: Diagnosis not present

## 2018-10-21 DIAGNOSIS — R4701 Aphasia: Secondary | ICD-10-CM | POA: Diagnosis not present

## 2018-10-23 DIAGNOSIS — R2689 Other abnormalities of gait and mobility: Secondary | ICD-10-CM | POA: Diagnosis not present

## 2018-10-23 DIAGNOSIS — R1312 Dysphagia, oropharyngeal phase: Secondary | ICD-10-CM | POA: Diagnosis not present

## 2018-10-23 DIAGNOSIS — R41841 Cognitive communication deficit: Secondary | ICD-10-CM | POA: Diagnosis not present

## 2018-10-23 DIAGNOSIS — M24561 Contracture, right knee: Secondary | ICD-10-CM | POA: Diagnosis not present

## 2018-10-23 DIAGNOSIS — R4701 Aphasia: Secondary | ICD-10-CM | POA: Diagnosis not present

## 2018-10-23 DIAGNOSIS — I69120 Aphasia following nontraumatic intracerebral hemorrhage: Secondary | ICD-10-CM | POA: Diagnosis not present

## 2018-10-24 DIAGNOSIS — R2689 Other abnormalities of gait and mobility: Secondary | ICD-10-CM | POA: Diagnosis not present

## 2018-10-24 DIAGNOSIS — M24561 Contracture, right knee: Secondary | ICD-10-CM | POA: Diagnosis not present

## 2018-10-24 DIAGNOSIS — I69120 Aphasia following nontraumatic intracerebral hemorrhage: Secondary | ICD-10-CM | POA: Diagnosis not present

## 2018-10-24 DIAGNOSIS — R4701 Aphasia: Secondary | ICD-10-CM | POA: Diagnosis not present

## 2018-10-24 DIAGNOSIS — R1312 Dysphagia, oropharyngeal phase: Secondary | ICD-10-CM | POA: Diagnosis not present

## 2018-10-24 DIAGNOSIS — R41841 Cognitive communication deficit: Secondary | ICD-10-CM | POA: Diagnosis not present

## 2018-10-25 DIAGNOSIS — R600 Localized edema: Secondary | ICD-10-CM | POA: Diagnosis not present

## 2018-10-25 DIAGNOSIS — R2231 Localized swelling, mass and lump, right upper limb: Secondary | ICD-10-CM | POA: Diagnosis not present

## 2018-10-25 DIAGNOSIS — M7989 Other specified soft tissue disorders: Secondary | ICD-10-CM | POA: Diagnosis not present

## 2018-10-25 DIAGNOSIS — M79631 Pain in right forearm: Secondary | ICD-10-CM | POA: Diagnosis not present

## 2018-10-25 DIAGNOSIS — M79621 Pain in right upper arm: Secondary | ICD-10-CM | POA: Diagnosis not present

## 2018-10-26 DIAGNOSIS — R4701 Aphasia: Secondary | ICD-10-CM | POA: Diagnosis not present

## 2018-10-26 DIAGNOSIS — R2689 Other abnormalities of gait and mobility: Secondary | ICD-10-CM | POA: Diagnosis not present

## 2018-10-26 DIAGNOSIS — R1312 Dysphagia, oropharyngeal phase: Secondary | ICD-10-CM | POA: Diagnosis not present

## 2018-10-26 DIAGNOSIS — R41841 Cognitive communication deficit: Secondary | ICD-10-CM | POA: Diagnosis not present

## 2018-10-26 DIAGNOSIS — M24561 Contracture, right knee: Secondary | ICD-10-CM | POA: Diagnosis not present

## 2018-10-26 DIAGNOSIS — I69120 Aphasia following nontraumatic intracerebral hemorrhage: Secondary | ICD-10-CM | POA: Diagnosis not present

## 2018-11-05 DIAGNOSIS — E039 Hypothyroidism, unspecified: Secondary | ICD-10-CM | POA: Diagnosis not present

## 2018-11-08 DIAGNOSIS — Z931 Gastrostomy status: Secondary | ICD-10-CM | POA: Diagnosis not present

## 2018-11-08 DIAGNOSIS — E038 Other specified hypothyroidism: Secondary | ICD-10-CM | POA: Diagnosis not present

## 2018-11-12 DIAGNOSIS — E038 Other specified hypothyroidism: Secondary | ICD-10-CM | POA: Diagnosis not present

## 2018-11-12 DIAGNOSIS — Z931 Gastrostomy status: Secondary | ICD-10-CM | POA: Diagnosis not present

## 2018-11-12 DIAGNOSIS — I480 Paroxysmal atrial fibrillation: Secondary | ICD-10-CM | POA: Diagnosis not present

## 2018-11-16 DIAGNOSIS — Z79899 Other long term (current) drug therapy: Secondary | ICD-10-CM | POA: Diagnosis not present

## 2018-11-23 DIAGNOSIS — N183 Chronic kidney disease, stage 3 (moderate): Secondary | ICD-10-CM | POA: Diagnosis not present

## 2018-11-23 DIAGNOSIS — Z515 Encounter for palliative care: Secondary | ICD-10-CM | POA: Diagnosis not present

## 2018-11-23 DIAGNOSIS — Z931 Gastrostomy status: Secondary | ICD-10-CM | POA: Diagnosis not present

## 2018-11-23 DIAGNOSIS — E038 Other specified hypothyroidism: Secondary | ICD-10-CM | POA: Diagnosis not present

## 2018-12-09 DIAGNOSIS — R2231 Localized swelling, mass and lump, right upper limb: Secondary | ICD-10-CM | POA: Diagnosis not present

## 2018-12-09 DIAGNOSIS — E038 Other specified hypothyroidism: Secondary | ICD-10-CM | POA: Diagnosis not present

## 2018-12-09 DIAGNOSIS — M7918 Myalgia, other site: Secondary | ICD-10-CM | POA: Diagnosis not present

## 2018-12-09 DIAGNOSIS — I480 Paroxysmal atrial fibrillation: Secondary | ICD-10-CM | POA: Diagnosis not present

## 2018-12-21 DIAGNOSIS — E039 Hypothyroidism, unspecified: Secondary | ICD-10-CM | POA: Diagnosis not present

## 2018-12-23 DIAGNOSIS — Z79899 Other long term (current) drug therapy: Secondary | ICD-10-CM | POA: Diagnosis not present

## 2018-12-29 ENCOUNTER — Telehealth: Payer: Self-pay | Admitting: Physical Medicine & Rehabilitation

## 2018-12-29 NOTE — Telephone Encounter (Signed)
[facility] CHANGES - NO FUTURE APPTS - LAST APPT IN 01/19 SHOWS PALLIATIVE CARE

## 2019-01-04 DIAGNOSIS — L603 Nail dystrophy: Secondary | ICD-10-CM | POA: Diagnosis not present

## 2019-01-04 DIAGNOSIS — E1151 Type 2 diabetes mellitus with diabetic peripheral angiopathy without gangrene: Secondary | ICD-10-CM | POA: Diagnosis not present

## 2019-01-04 DIAGNOSIS — B351 Tinea unguium: Secondary | ICD-10-CM | POA: Diagnosis not present

## 2019-01-07 DIAGNOSIS — Z79899 Other long term (current) drug therapy: Secondary | ICD-10-CM | POA: Diagnosis not present

## 2019-01-07 DIAGNOSIS — I69120 Aphasia following nontraumatic intracerebral hemorrhage: Secondary | ICD-10-CM | POA: Diagnosis not present

## 2019-01-07 DIAGNOSIS — R278 Other lack of coordination: Secondary | ICD-10-CM | POA: Diagnosis not present

## 2019-01-07 DIAGNOSIS — M6281 Muscle weakness (generalized): Secondary | ICD-10-CM | POA: Diagnosis not present

## 2019-01-07 DIAGNOSIS — M24561 Contracture, right knee: Secondary | ICD-10-CM | POA: Diagnosis not present

## 2019-01-07 DIAGNOSIS — R41841 Cognitive communication deficit: Secondary | ICD-10-CM | POA: Diagnosis not present

## 2019-01-07 DIAGNOSIS — R293 Abnormal posture: Secondary | ICD-10-CM | POA: Diagnosis not present

## 2019-01-07 DIAGNOSIS — E039 Hypothyroidism, unspecified: Secondary | ICD-10-CM | POA: Diagnosis not present

## 2019-01-07 DIAGNOSIS — R2689 Other abnormalities of gait and mobility: Secondary | ICD-10-CM | POA: Diagnosis not present

## 2019-01-07 DIAGNOSIS — R1312 Dysphagia, oropharyngeal phase: Secondary | ICD-10-CM | POA: Diagnosis not present

## 2019-01-07 DIAGNOSIS — R4701 Aphasia: Secondary | ICD-10-CM | POA: Diagnosis not present

## 2019-01-10 DIAGNOSIS — E038 Other specified hypothyroidism: Secondary | ICD-10-CM | POA: Diagnosis not present

## 2019-01-10 DIAGNOSIS — Z515 Encounter for palliative care: Secondary | ICD-10-CM | POA: Diagnosis not present

## 2019-01-10 DIAGNOSIS — E039 Hypothyroidism, unspecified: Secondary | ICD-10-CM | POA: Diagnosis not present

## 2019-01-11 DIAGNOSIS — R532 Functional quadriplegia: Secondary | ICD-10-CM | POA: Diagnosis not present

## 2019-01-11 DIAGNOSIS — I6310 Cerebral infarction due to embolism of unspecified precerebral artery: Secondary | ICD-10-CM | POA: Diagnosis not present

## 2019-01-11 DIAGNOSIS — G4089 Other seizures: Secondary | ICD-10-CM | POA: Diagnosis not present

## 2019-01-11 DIAGNOSIS — I480 Paroxysmal atrial fibrillation: Secondary | ICD-10-CM | POA: Diagnosis not present

## 2019-01-11 DIAGNOSIS — I6389 Other cerebral infarction: Secondary | ICD-10-CM | POA: Diagnosis not present

## 2019-01-11 DIAGNOSIS — I69391 Dysphagia following cerebral infarction: Secondary | ICD-10-CM | POA: Diagnosis not present

## 2019-01-11 DIAGNOSIS — I629 Nontraumatic intracranial hemorrhage, unspecified: Secondary | ICD-10-CM | POA: Diagnosis not present

## 2019-01-11 DIAGNOSIS — E038 Other specified hypothyroidism: Secondary | ICD-10-CM | POA: Diagnosis not present

## 2019-01-11 DIAGNOSIS — R4701 Aphasia: Secondary | ICD-10-CM | POA: Diagnosis not present

## 2019-01-11 DIAGNOSIS — Z931 Gastrostomy status: Secondary | ICD-10-CM | POA: Diagnosis not present

## 2019-01-13 ENCOUNTER — Non-Acute Institutional Stay: Payer: Medicare Other | Admitting: Internal Medicine

## 2019-01-13 ENCOUNTER — Other Ambulatory Visit: Payer: Self-pay

## 2019-01-13 DIAGNOSIS — Z515 Encounter for palliative care: Secondary | ICD-10-CM | POA: Diagnosis not present

## 2019-01-14 ENCOUNTER — Other Ambulatory Visit: Payer: Self-pay

## 2019-01-14 DIAGNOSIS — R1312 Dysphagia, oropharyngeal phase: Secondary | ICD-10-CM | POA: Diagnosis not present

## 2019-01-14 DIAGNOSIS — R41841 Cognitive communication deficit: Secondary | ICD-10-CM | POA: Diagnosis not present

## 2019-01-14 DIAGNOSIS — I69120 Aphasia following nontraumatic intracerebral hemorrhage: Secondary | ICD-10-CM | POA: Diagnosis not present

## 2019-01-14 DIAGNOSIS — R2689 Other abnormalities of gait and mobility: Secondary | ICD-10-CM | POA: Diagnosis not present

## 2019-01-14 DIAGNOSIS — R4701 Aphasia: Secondary | ICD-10-CM | POA: Diagnosis not present

## 2019-01-14 DIAGNOSIS — M24561 Contracture, right knee: Secondary | ICD-10-CM | POA: Diagnosis not present

## 2019-01-26 DIAGNOSIS — H1089 Other conjunctivitis: Secondary | ICD-10-CM | POA: Diagnosis not present

## 2019-02-03 DIAGNOSIS — N481 Balanitis: Secondary | ICD-10-CM | POA: Diagnosis not present

## 2019-02-09 DIAGNOSIS — R221 Localized swelling, mass and lump, neck: Secondary | ICD-10-CM | POA: Diagnosis not present

## 2019-02-25 DIAGNOSIS — B349 Viral infection, unspecified: Secondary | ICD-10-CM | POA: Diagnosis not present

## 2019-03-11 DIAGNOSIS — B349 Viral infection, unspecified: Secondary | ICD-10-CM | POA: Diagnosis not present

## 2019-03-16 DIAGNOSIS — I69391 Dysphagia following cerebral infarction: Secondary | ICD-10-CM | POA: Diagnosis not present

## 2019-03-16 DIAGNOSIS — G4089 Other seizures: Secondary | ICD-10-CM | POA: Diagnosis not present

## 2019-03-16 DIAGNOSIS — I629 Nontraumatic intracranial hemorrhage, unspecified: Secondary | ICD-10-CM | POA: Diagnosis not present

## 2019-03-16 DIAGNOSIS — Z931 Gastrostomy status: Secondary | ICD-10-CM | POA: Diagnosis not present

## 2019-03-16 DIAGNOSIS — Z515 Encounter for palliative care: Secondary | ICD-10-CM | POA: Diagnosis not present

## 2019-03-16 DIAGNOSIS — I6389 Other cerebral infarction: Secondary | ICD-10-CM | POA: Diagnosis not present

## 2019-03-16 DIAGNOSIS — I480 Paroxysmal atrial fibrillation: Secondary | ICD-10-CM | POA: Diagnosis not present

## 2019-03-16 DIAGNOSIS — E038 Other specified hypothyroidism: Secondary | ICD-10-CM | POA: Diagnosis not present

## 2019-03-22 DIAGNOSIS — R532 Functional quadriplegia: Secondary | ICD-10-CM | POA: Diagnosis not present

## 2019-03-22 DIAGNOSIS — R5081 Fever presenting with conditions classified elsewhere: Secondary | ICD-10-CM | POA: Diagnosis not present

## 2019-03-22 DIAGNOSIS — Z515 Encounter for palliative care: Secondary | ICD-10-CM | POA: Diagnosis not present

## 2019-03-22 DIAGNOSIS — Z931 Gastrostomy status: Secondary | ICD-10-CM | POA: Diagnosis not present

## 2019-03-22 DIAGNOSIS — I6389 Other cerebral infarction: Secondary | ICD-10-CM | POA: Diagnosis not present

## 2019-03-22 DIAGNOSIS — R0989 Other specified symptoms and signs involving the circulatory and respiratory systems: Secondary | ICD-10-CM | POA: Diagnosis not present

## 2019-03-22 DIAGNOSIS — I69391 Dysphagia following cerebral infarction: Secondary | ICD-10-CM | POA: Diagnosis not present

## 2019-03-22 DIAGNOSIS — R0602 Shortness of breath: Secondary | ICD-10-CM | POA: Diagnosis not present

## 2019-03-22 DIAGNOSIS — J40 Bronchitis, not specified as acute or chronic: Secondary | ICD-10-CM | POA: Diagnosis not present

## 2019-03-24 ENCOUNTER — Other Ambulatory Visit: Payer: Self-pay

## 2019-03-24 ENCOUNTER — Non-Acute Institutional Stay: Payer: Medicare Other | Admitting: Internal Medicine

## 2019-03-24 DIAGNOSIS — Z515 Encounter for palliative care: Secondary | ICD-10-CM | POA: Diagnosis not present

## 2019-03-25 DIAGNOSIS — E785 Hyperlipidemia, unspecified: Secondary | ICD-10-CM | POA: Diagnosis not present

## 2019-03-25 DIAGNOSIS — Z515 Encounter for palliative care: Secondary | ICD-10-CM | POA: Diagnosis not present

## 2019-03-25 DIAGNOSIS — I69391 Dysphagia following cerebral infarction: Secondary | ICD-10-CM | POA: Diagnosis not present

## 2019-03-25 DIAGNOSIS — I4891 Unspecified atrial fibrillation: Secondary | ICD-10-CM | POA: Diagnosis not present

## 2019-03-25 DIAGNOSIS — M24561 Contracture, right knee: Secondary | ICD-10-CM | POA: Diagnosis not present

## 2019-03-25 DIAGNOSIS — I69320 Aphasia following cerebral infarction: Secondary | ICD-10-CM | POA: Diagnosis not present

## 2019-03-25 DIAGNOSIS — Z79899 Other long term (current) drug therapy: Secondary | ICD-10-CM | POA: Diagnosis not present

## 2019-03-25 DIAGNOSIS — Z931 Gastrostomy status: Secondary | ICD-10-CM | POA: Diagnosis not present

## 2019-03-25 DIAGNOSIS — J690 Pneumonitis due to inhalation of food and vomit: Secondary | ICD-10-CM | POA: Diagnosis not present

## 2019-03-25 DIAGNOSIS — E039 Hypothyroidism, unspecified: Secondary | ICD-10-CM | POA: Diagnosis not present

## 2019-03-25 DIAGNOSIS — R32 Unspecified urinary incontinence: Secondary | ICD-10-CM | POA: Diagnosis not present

## 2019-03-25 DIAGNOSIS — N400 Enlarged prostate without lower urinary tract symptoms: Secondary | ICD-10-CM | POA: Diagnosis not present

## 2019-03-25 DIAGNOSIS — I2510 Atherosclerotic heart disease of native coronary artery without angina pectoris: Secondary | ICD-10-CM | POA: Diagnosis not present

## 2019-03-25 DIAGNOSIS — G40909 Epilepsy, unspecified, not intractable, without status epilepticus: Secondary | ICD-10-CM | POA: Diagnosis not present

## 2019-03-25 DIAGNOSIS — Z7401 Bed confinement status: Secondary | ICD-10-CM | POA: Diagnosis not present

## 2019-03-25 DIAGNOSIS — R159 Full incontinence of feces: Secondary | ICD-10-CM | POA: Diagnosis not present

## 2019-03-25 DIAGNOSIS — Z87448 Personal history of other diseases of urinary system: Secondary | ICD-10-CM | POA: Diagnosis not present

## 2019-03-25 DIAGNOSIS — N390 Urinary tract infection, site not specified: Secondary | ICD-10-CM | POA: Diagnosis not present

## 2019-03-25 DIAGNOSIS — Z6832 Body mass index (BMI) 32.0-32.9, adult: Secondary | ICD-10-CM | POA: Diagnosis not present

## 2019-03-25 DIAGNOSIS — R532 Functional quadriplegia: Secondary | ICD-10-CM | POA: Diagnosis not present

## 2019-03-25 DIAGNOSIS — Z741 Need for assistance with personal care: Secondary | ICD-10-CM | POA: Diagnosis not present

## 2019-03-25 NOTE — Progress Notes (Signed)
[facility] COLLECTIVE  PALLIATIVE CARE CONSULT VISIT   PATIENT NAME: Randy Durham DOB: [birth year] MRN: [id number]  PRIMARY CARE PROVIDER:   Dr. Blenda MountsKhashana Blake  REFERRING PROVIDER:      Uvaldo BristleLee Yarber, NP/ Dr Burnice LoganK. Blake [facility]               RESPONSIBLE PARTY:   Randy Durham (wife) [phone], [phone] cell     RECOMMENDATIONS and PLAN:  Palliative care encounter  Z51.5  1.  Nontraumatic intracranial hemorrhage:  Additional cognitive and functional decline. FAST stage 7e  Consider comfort care.  2.  Weakness generalized: Chronic state and has progressed.  Continue total and supportive care with q2-3 hour turning/repositioning.  3. Fever and dyspnea:  New onset with potential aspiration and COVID-19 exposure(roommate was COVID +).  Complete antibiotic therapy for PNA.  Tylenol prn fever. Supportive care but consider comfort care related decline of health and comorbidities of afib, CKD stage 3 and CAD and nearing EOL.  3.  Anorexia: No oral intake. Tube feedings only with recent vomiting. High risk for aspiration.  Consider at least decreasing flow rate   4.  Advanced care planning:  Long term resident of snf since cerebral hemorrhage in 2018. Gradual but progressive cognitive and functional decline.  Code status is already DNAR/DNI.  Lengthy discussion with wife via phone who is interested in comfort care, no return to hospital and discontinuation of tube feedings.  She expressed that she realizes pt will not improve beyond his baseline of significant cognitive deficit and bedridden status, if he does improve.  "He does not have any quality of life".  Discussed value of transition to Hospice Care for assistance of patient and family for EOL needs.  She was in agreement and had considered same previously but "she was not ready for transition at that time".(January 2020)  Explanation of the referral process given and questions were answered.  Follow-up phone call to Uvaldo BristleLee  Yarber, NP.  Updated on pt status and conversation with wife.  She agrees with plans for transition to Hospice care.  She has already prescribed Morphine solution 5mg  prn pain or dyspnea and Atropine drops prn terminal secretions.  Verbal order received and phoned into [facility] triage nurse.  Further discussion with wife/family will occur on 03/25/19.  Due to the COVID-19 crisis, this visit was performed via telehealth from my office.  It was initiated and consented by family.    [facility],  from 1215pm to 1250pm. More than 50% of the time in this consultation was spent coordinating communication with patient, clinical staff, SW and Nedra HaiLee, NP.Marland Kitchen.   HISTORY OF PRESENT ILLNESS:  [facility]ine [facility]  [facility].   Staff reports that he has had changes of respiratory status with "gurgling", fever and cyanosis when he is turned/ repositioned but has been able to maintain his saturations >90%. CXR notes "lower lobe infiltrate". He was hypotensive on 03/23/19 in the 80's systolic and BP has returned to more of a normal range today.   Wife reports that patient vomited several days ago.  He is less responsive and remains non-verbal with total care requirements/bedbound. Normally opens eyes and will focus with visitor conversation. He receives tube feeding infusions with no oral intake. No reports of wounds or falls.    CODE STATUS: DNAR/DNI  PPS: 20% weak  Tube feedings only HOSPICE ELIGIBILITY/DIAGNOSIS: YES/ Pneumonia, Cerebrovascular disease  PAST MEDICAL HISTORY:  Past Medical History:  Diagnosis Date  .  Anemia   . CAD (coronary artery disease)    Cath 03/26/2015 100% distal LAD stenosis, 80% ostial D1 stenosis, 75% ramus stenosis, 60% RPDA stenosis, 30% proximal RCA stenosis. Medical therapy, outpt myoview to assess LCx lesion  . Chronic kidney disease (CKD), stage III (moderate) (HCC)   . Dysrhythmia    a fib  . Hyperlipidemia   . Hypertension   .  Hypothyroidism   . Obesity (BMI 30-39.9)   . Spinal stenosis   . Stroke (HCC)    06/23/17  . Thyroid nodule      PHYSICAL EXAM:   General: Ill and frail appearing elderly male reclining in bed.  Cardiovascular: Unable to assess Pulmonary: increased effort with blowing respirations Abdomen: Tube feedings infusing via PEG tube. Extremities: facial edema, no peripheral edema Skin: facial flushing Neurological: Somnolent, does not respond to verbal or physical stimuli  [name], NP-C

## 2019-03-25 NOTE — Progress Notes (Signed)
    Therapist, nutritional Palliative Care Consult Note Telephone: [phone]  Fax: [phone]   PATIENT NAME: Randy Durham DOB: [birth year] MRN: [id number]  PRIMARY CARE PROVIDER:   Dr. Blenda Mounts  REFERRING PROVIDER:      Uvaldo Bristle, NP/ Dr. [facility]   RESPONSIBLE PARTY:   Randy Durham (wife) [phone], [phone] cell    RECOMMENDATIONS and PLAN:  Palliative Care Encounter Z51.5  1.  Nontraumatic intracranial hemorrhage:  Baseline cognitive and functional deficits. FAST stage 7d  Continue supportive care.  2.  Weakness generalized: Chronic state.  No longer ambulatory with assistance.   3.  Anorexia:  Continues to tolerate tube feeding via PEG well.  No oral intake. Monitor weights and potential for aspiration.  4.  Advanced care planning:  Remains DNR status.  Family declined previous transition to Hospice despite gradual decline.  No expectation of improvement.  Goals are to continue current level of supportive care with tube feedings.  No desire for return to hospital.  Palliative care will continue to follow.  I spent 20 minutes providing this consultation at [facility],  from 0900am to 0920am at bedside. More than 50% of the time in this consultation was spent coordinating communication with clinical staff and SW   HISTORY OF PRESENT ILLNESS:  Routine follow-up Randy Durham finds that he has been without acute illness, falls or hospitalizations. He remains total care. No complaints from nursing staff at this time.    CODE STATUS: DNAR  PPS: 20% No oral nutritional intake HOSPICE ELIGIBILITY/DIAGNOSIS: TBD  PAST MEDICAL HISTORY:  Past Medical History:  Diagnosis Date  . Anemia   . CAD (coronary artery disease)    Cath 03/26/2015 100% distal LAD stenosis, 80% ostial D1 stenosis, 75% ramus stenosis, 60% RPDA stenosis, 30% proximal RCA stenosis. Medical therapy, outpt myoview to assess LCx lesion  . Chronic kidney  disease (CKD), stage III (moderate) (HCC)   . Dysrhythmia    a fib  . Hyperlipidemia   . Hypertension   . Hypothyroidism   . Obesity (BMI 30-39.9)   . Spinal stenosis   . Stroke (HCC)    06/23/17  . Thyroid nodule      PHYSICAL EXAM:   General: Well nourished elderly male reclining in bed.  In NAD Cardiovascular: regular rate and rhythm Pulmonary: clear ant fields.  No increased resp effort Abdomen: soft, nontender, + bowel sounds.  Tube feedings infusing via PEG tube. Extremities: no edema Skin: Exposed skin is intact Neurological: Somnolent, generalized weakness.  Non-verbal but opens eyes spontaneously.  Does not tract  Randy Sheffield, NP-C

## 2019-03-26 DIAGNOSIS — N400 Enlarged prostate without lower urinary tract symptoms: Secondary | ICD-10-CM | POA: Diagnosis not present

## 2019-03-26 DIAGNOSIS — I69320 Aphasia following cerebral infarction: Secondary | ICD-10-CM | POA: Diagnosis not present

## 2019-03-26 DIAGNOSIS — J690 Pneumonitis due to inhalation of food and vomit: Secondary | ICD-10-CM | POA: Diagnosis not present

## 2019-03-26 DIAGNOSIS — I69391 Dysphagia following cerebral infarction: Secondary | ICD-10-CM | POA: Diagnosis not present

## 2019-03-26 DIAGNOSIS — I2510 Atherosclerotic heart disease of native coronary artery without angina pectoris: Secondary | ICD-10-CM | POA: Diagnosis not present

## 2019-03-26 DIAGNOSIS — I4891 Unspecified atrial fibrillation: Secondary | ICD-10-CM | POA: Diagnosis not present

## 2019-03-29 DIAGNOSIS — N400 Enlarged prostate without lower urinary tract symptoms: Secondary | ICD-10-CM | POA: Diagnosis not present

## 2019-03-29 DIAGNOSIS — I2510 Atherosclerotic heart disease of native coronary artery without angina pectoris: Secondary | ICD-10-CM | POA: Diagnosis not present

## 2019-03-29 DIAGNOSIS — I69391 Dysphagia following cerebral infarction: Secondary | ICD-10-CM | POA: Diagnosis not present

## 2019-03-29 DIAGNOSIS — J690 Pneumonitis due to inhalation of food and vomit: Secondary | ICD-10-CM | POA: Diagnosis not present

## 2019-03-29 DIAGNOSIS — I4891 Unspecified atrial fibrillation: Secondary | ICD-10-CM | POA: Diagnosis not present

## 2019-03-29 DIAGNOSIS — I69320 Aphasia following cerebral infarction: Secondary | ICD-10-CM | POA: Diagnosis not present

## 2019-03-31 DIAGNOSIS — N400 Enlarged prostate without lower urinary tract symptoms: Secondary | ICD-10-CM | POA: Diagnosis not present

## 2019-03-31 DIAGNOSIS — J690 Pneumonitis due to inhalation of food and vomit: Secondary | ICD-10-CM | POA: Diagnosis not present

## 2019-03-31 DIAGNOSIS — I69391 Dysphagia following cerebral infarction: Secondary | ICD-10-CM | POA: Diagnosis not present

## 2019-03-31 DIAGNOSIS — I2510 Atherosclerotic heart disease of native coronary artery without angina pectoris: Secondary | ICD-10-CM | POA: Diagnosis not present

## 2019-03-31 DIAGNOSIS — I4891 Unspecified atrial fibrillation: Secondary | ICD-10-CM | POA: Diagnosis not present

## 2019-03-31 DIAGNOSIS — I69320 Aphasia following cerebral infarction: Secondary | ICD-10-CM | POA: Diagnosis not present

## 2019-04-03 DIAGNOSIS — J690 Pneumonitis due to inhalation of food and vomit: Secondary | ICD-10-CM | POA: Diagnosis not present

## 2019-04-03 DIAGNOSIS — I69320 Aphasia following cerebral infarction: Secondary | ICD-10-CM | POA: Diagnosis not present

## 2019-04-03 DIAGNOSIS — N400 Enlarged prostate without lower urinary tract symptoms: Secondary | ICD-10-CM | POA: Diagnosis not present

## 2019-04-03 DIAGNOSIS — I2510 Atherosclerotic heart disease of native coronary artery without angina pectoris: Secondary | ICD-10-CM | POA: Diagnosis not present

## 2019-04-03 DIAGNOSIS — I4891 Unspecified atrial fibrillation: Secondary | ICD-10-CM | POA: Diagnosis not present

## 2019-04-03 DIAGNOSIS — I69391 Dysphagia following cerebral infarction: Secondary | ICD-10-CM | POA: Diagnosis not present

## 2019-04-04 DIAGNOSIS — J690 Pneumonitis due to inhalation of food and vomit: Secondary | ICD-10-CM | POA: Diagnosis not present

## 2019-04-04 DIAGNOSIS — Z6832 Body mass index (BMI) 32.0-32.9, adult: Secondary | ICD-10-CM | POA: Diagnosis not present

## 2019-04-04 DIAGNOSIS — Z87448 Personal history of other diseases of urinary system: Secondary | ICD-10-CM | POA: Diagnosis not present

## 2019-04-04 DIAGNOSIS — Z741 Need for assistance with personal care: Secondary | ICD-10-CM | POA: Diagnosis not present

## 2019-04-04 DIAGNOSIS — Z931 Gastrostomy status: Secondary | ICD-10-CM | POA: Diagnosis not present

## 2019-04-04 DIAGNOSIS — Z7401 Bed confinement status: Secondary | ICD-10-CM | POA: Diagnosis not present

## 2019-04-04 DIAGNOSIS — N400 Enlarged prostate without lower urinary tract symptoms: Secondary | ICD-10-CM | POA: Diagnosis not present

## 2019-04-04 DIAGNOSIS — R32 Unspecified urinary incontinence: Secondary | ICD-10-CM | POA: Diagnosis not present

## 2019-04-04 DIAGNOSIS — I69320 Aphasia following cerebral infarction: Secondary | ICD-10-CM | POA: Diagnosis not present

## 2019-04-04 DIAGNOSIS — I2510 Atherosclerotic heart disease of native coronary artery without angina pectoris: Secondary | ICD-10-CM | POA: Diagnosis not present

## 2019-04-04 DIAGNOSIS — I69391 Dysphagia following cerebral infarction: Secondary | ICD-10-CM | POA: Diagnosis not present

## 2019-04-04 DIAGNOSIS — E039 Hypothyroidism, unspecified: Secondary | ICD-10-CM | POA: Diagnosis not present

## 2019-04-04 DIAGNOSIS — G40909 Epilepsy, unspecified, not intractable, without status epilepticus: Secondary | ICD-10-CM | POA: Diagnosis not present

## 2019-04-04 DIAGNOSIS — M24561 Contracture, right knee: Secondary | ICD-10-CM | POA: Diagnosis not present

## 2019-04-04 DIAGNOSIS — I4891 Unspecified atrial fibrillation: Secondary | ICD-10-CM | POA: Diagnosis not present

## 2019-04-04 DIAGNOSIS — R159 Full incontinence of feces: Secondary | ICD-10-CM | POA: Diagnosis not present

## 2019-04-04 DIAGNOSIS — E785 Hyperlipidemia, unspecified: Secondary | ICD-10-CM | POA: Diagnosis not present

## 2019-05-04 DEATH — deceased
